# Patient Record
Sex: Female | Born: 1935 | Race: White | Hispanic: No | Marital: Married | Smoking: Never smoker
Health system: Southern US, Community
[De-identification: ages and names within clinical notes are randomized; demographics above are authoritative.]

## PROBLEM LIST (undated history)

## (undated) DIAGNOSIS — G4733 Obstructive sleep apnea (adult) (pediatric): Secondary | ICD-10-CM

## (undated) DIAGNOSIS — Z8673 Personal history of transient ischemic attack (TIA), and cerebral infarction without residual deficits: Secondary | ICD-10-CM

## (undated) DIAGNOSIS — D696 Thrombocytopenia, unspecified: Secondary | ICD-10-CM

## (undated) DIAGNOSIS — I2510 Atherosclerotic heart disease of native coronary artery without angina pectoris: Secondary | ICD-10-CM

## (undated) DIAGNOSIS — I10 Essential (primary) hypertension: Secondary | ICD-10-CM

## (undated) DIAGNOSIS — F329 Major depressive disorder, single episode, unspecified: Secondary | ICD-10-CM

## (undated) DIAGNOSIS — K589 Irritable bowel syndrome without diarrhea: Secondary | ICD-10-CM

## (undated) DIAGNOSIS — G629 Polyneuropathy, unspecified: Secondary | ICD-10-CM

## (undated) DIAGNOSIS — M1990 Unspecified osteoarthritis, unspecified site: Secondary | ICD-10-CM

## (undated) DIAGNOSIS — I7101 Dissection of thoracic aorta: Secondary | ICD-10-CM

## (undated) DIAGNOSIS — F32A Depression, unspecified: Secondary | ICD-10-CM

## (undated) DIAGNOSIS — Q211 Atrial septal defect, unspecified: Secondary | ICD-10-CM

## (undated) DIAGNOSIS — I71019 Dissection of thoracic aorta, unspecified: Secondary | ICD-10-CM

## (undated) DIAGNOSIS — E785 Hyperlipidemia, unspecified: Secondary | ICD-10-CM

## (undated) DIAGNOSIS — G4730 Sleep apnea, unspecified: Secondary | ICD-10-CM

## (undated) DIAGNOSIS — H3530 Unspecified macular degeneration: Secondary | ICD-10-CM

## (undated) DIAGNOSIS — D509 Iron deficiency anemia, unspecified: Secondary | ICD-10-CM

## (undated) DIAGNOSIS — R7303 Prediabetes: Secondary | ICD-10-CM

## (undated) DIAGNOSIS — D649 Anemia, unspecified: Secondary | ICD-10-CM

## (undated) DIAGNOSIS — I5032 Chronic diastolic (congestive) heart failure: Secondary | ICD-10-CM

## (undated) DIAGNOSIS — K224 Dyskinesia of esophagus: Secondary | ICD-10-CM

## (undated) DIAGNOSIS — K222 Esophageal obstruction: Secondary | ICD-10-CM

## (undated) DIAGNOSIS — I50810 Right heart failure, unspecified: Secondary | ICD-10-CM

## (undated) DIAGNOSIS — I4891 Unspecified atrial fibrillation: Secondary | ICD-10-CM

## (undated) DIAGNOSIS — M797 Fibromyalgia: Secondary | ICD-10-CM

## (undated) DIAGNOSIS — K219 Gastro-esophageal reflux disease without esophagitis: Secondary | ICD-10-CM

## (undated) DIAGNOSIS — F419 Anxiety disorder, unspecified: Secondary | ICD-10-CM

## (undated) DIAGNOSIS — K635 Polyp of colon: Secondary | ICD-10-CM

## (undated) DIAGNOSIS — C801 Malignant (primary) neoplasm, unspecified: Secondary | ICD-10-CM

## (undated) HISTORY — DX: Essential (primary) hypertension: I10

## (undated) HISTORY — DX: Dyskinesia of esophagus: K22.4

## (undated) HISTORY — DX: Sleep apnea, unspecified: G47.30

## (undated) HISTORY — DX: Atrial septal defect: Q21.1

## (undated) HISTORY — DX: Hyperlipidemia, unspecified: E78.5

## (undated) HISTORY — DX: Anemia, unspecified: D64.9

## (undated) HISTORY — DX: Right heart failure, unspecified: I50.810

## (undated) HISTORY — PX: TONSILLECTOMY AND ADENOIDECTOMY: SUR1326

## (undated) HISTORY — DX: Gastro-esophageal reflux disease without esophagitis: K21.9

## (undated) HISTORY — PX: APPENDECTOMY: SHX54

## (undated) HISTORY — DX: Major depressive disorder, single episode, unspecified: F32.9

## (undated) HISTORY — DX: Polyp of colon: K63.5

## (undated) HISTORY — DX: Atrial septal defect, unspecified: Q21.10

## (undated) HISTORY — DX: Prediabetes: R73.03

## (undated) HISTORY — DX: Thrombocytopenia, unspecified: D69.6

## (undated) HISTORY — PX: CATARACT EXTRACTION W/ INTRAOCULAR LENS  IMPLANT, BILATERAL: SHX1307

## (undated) HISTORY — DX: Dissection of thoracic aorta, unspecified: I71.019

## (undated) HISTORY — DX: Esophageal obstruction: K22.2

## (undated) HISTORY — DX: Obstructive sleep apnea (adult) (pediatric): G47.33

## (undated) HISTORY — DX: Iron deficiency anemia, unspecified: D50.9

## (undated) HISTORY — PX: ESOPHAGOGASTRODUODENOSCOPY (EGD) WITH ESOPHAGEAL DILATION: SHX5812

## (undated) HISTORY — DX: Fibromyalgia: M79.7

## (undated) HISTORY — DX: Irritable bowel syndrome, unspecified: K58.9

## (undated) HISTORY — PX: CARDIOVERSION: SHX1299

## (undated) HISTORY — DX: Malignant (primary) neoplasm, unspecified: C80.1

## (undated) HISTORY — DX: Dissection of thoracic aorta: I71.01

## (undated) HISTORY — DX: Anxiety disorder, unspecified: F41.9

## (undated) HISTORY — DX: Unspecified macular degeneration: H35.30

## (undated) HISTORY — DX: Atherosclerotic heart disease of native coronary artery without angina pectoris: I25.10

## (undated) HISTORY — DX: Unspecified osteoarthritis, unspecified site: M19.90

## (undated) HISTORY — DX: Depression, unspecified: F32.A

## (undated) HISTORY — DX: Polyneuropathy, unspecified: G62.9

## (undated) HISTORY — PX: CARDIAC CATHETERIZATION: SHX172

## (undated) HISTORY — DX: Unspecified atrial fibrillation: I48.91

## (undated) HISTORY — DX: Personal history of transient ischemic attack (TIA), and cerebral infarction without residual deficits: Z86.73

---

## 1967-11-16 HISTORY — PX: TUBAL LIGATION: SHX77

## 1980-11-15 HISTORY — PX: ASD REPAIR: SHX258

## 1985-11-15 HISTORY — PX: VAGINAL HYSTERECTOMY: SUR661

## 1992-11-15 HISTORY — PX: SALPINGOOPHORECTOMY: SHX82

## 1992-11-15 HISTORY — PX: PELVIC LAPAROSCOPY: SHX162

## 1998-02-18 ENCOUNTER — Other Ambulatory Visit: Admission: RE | Admit: 1998-02-18 | Discharge: 1998-02-18 | Payer: Self-pay | Admitting: Obstetrics and Gynecology

## 1998-04-23 ENCOUNTER — Other Ambulatory Visit: Admission: RE | Admit: 1998-04-23 | Discharge: 1998-04-23 | Payer: Self-pay | Admitting: Obstetrics and Gynecology

## 1999-05-11 ENCOUNTER — Encounter: Payer: Self-pay | Admitting: Emergency Medicine

## 1999-05-11 ENCOUNTER — Inpatient Hospital Stay (HOSPITAL_COMMUNITY): Admission: EM | Admit: 1999-05-11 | Discharge: 1999-05-15 | Payer: Self-pay | Admitting: Emergency Medicine

## 1999-05-12 ENCOUNTER — Encounter: Payer: Self-pay | Admitting: Pulmonary Disease

## 1999-06-08 ENCOUNTER — Encounter: Payer: Self-pay | Admitting: *Deleted

## 1999-06-08 ENCOUNTER — Ambulatory Visit (HOSPITAL_COMMUNITY): Admission: RE | Admit: 1999-06-08 | Discharge: 1999-06-08 | Payer: Self-pay | Admitting: *Deleted

## 1999-06-11 ENCOUNTER — Ambulatory Visit (HOSPITAL_COMMUNITY): Admission: RE | Admit: 1999-06-11 | Discharge: 1999-06-11 | Payer: Self-pay | Admitting: *Deleted

## 1999-06-11 ENCOUNTER — Encounter: Payer: Self-pay | Admitting: *Deleted

## 1999-11-13 ENCOUNTER — Ambulatory Visit (HOSPITAL_COMMUNITY): Admission: RE | Admit: 1999-11-13 | Discharge: 1999-11-13 | Payer: Self-pay | Admitting: Neurology

## 1999-11-13 ENCOUNTER — Encounter: Payer: Self-pay | Admitting: Neurology

## 2000-03-04 ENCOUNTER — Ambulatory Visit: Admission: RE | Admit: 2000-03-04 | Discharge: 2000-03-04 | Payer: Self-pay | Admitting: Orthopedic Surgery

## 2000-06-30 ENCOUNTER — Other Ambulatory Visit: Admission: RE | Admit: 2000-06-30 | Discharge: 2000-06-30 | Payer: Self-pay | Admitting: Obstetrics and Gynecology

## 2000-07-07 ENCOUNTER — Encounter: Payer: Self-pay | Admitting: Pulmonary Disease

## 2000-07-07 ENCOUNTER — Ambulatory Visit (HOSPITAL_COMMUNITY): Admission: RE | Admit: 2000-07-07 | Discharge: 2000-07-07 | Payer: Self-pay | Admitting: Pulmonary Disease

## 2000-07-28 ENCOUNTER — Encounter: Admission: RE | Admit: 2000-07-28 | Discharge: 2000-07-28 | Payer: Self-pay | Admitting: Obstetrics and Gynecology

## 2000-07-28 ENCOUNTER — Encounter: Payer: Self-pay | Admitting: Obstetrics and Gynecology

## 2001-03-21 ENCOUNTER — Other Ambulatory Visit: Admission: RE | Admit: 2001-03-21 | Discharge: 2001-03-21 | Payer: Self-pay | Admitting: Internal Medicine

## 2001-08-08 ENCOUNTER — Encounter: Admission: RE | Admit: 2001-08-08 | Discharge: 2001-08-08 | Payer: Self-pay | Admitting: Obstetrics and Gynecology

## 2001-08-08 ENCOUNTER — Encounter: Payer: Self-pay | Admitting: Obstetrics and Gynecology

## 2002-04-10 ENCOUNTER — Encounter (INDEPENDENT_AMBULATORY_CARE_PROVIDER_SITE_OTHER): Payer: Self-pay | Admitting: *Deleted

## 2002-04-10 ENCOUNTER — Ambulatory Visit (HOSPITAL_COMMUNITY): Admission: RE | Admit: 2002-04-10 | Discharge: 2002-04-10 | Payer: Self-pay | Admitting: Internal Medicine

## 2002-04-10 ENCOUNTER — Encounter: Payer: Self-pay | Admitting: Internal Medicine

## 2002-09-18 ENCOUNTER — Encounter: Admission: RE | Admit: 2002-09-18 | Discharge: 2002-09-18 | Payer: Self-pay | Admitting: Obstetrics and Gynecology

## 2002-09-18 ENCOUNTER — Encounter: Payer: Self-pay | Admitting: Obstetrics and Gynecology

## 2002-10-02 ENCOUNTER — Other Ambulatory Visit: Admission: RE | Admit: 2002-10-02 | Discharge: 2002-10-02 | Payer: Self-pay | Admitting: Obstetrics and Gynecology

## 2003-04-16 ENCOUNTER — Encounter: Payer: Self-pay | Admitting: Internal Medicine

## 2003-10-28 ENCOUNTER — Encounter: Admission: RE | Admit: 2003-10-28 | Discharge: 2003-10-28 | Payer: Self-pay | Admitting: Pulmonary Disease

## 2004-09-24 ENCOUNTER — Ambulatory Visit: Payer: Self-pay

## 2004-10-05 ENCOUNTER — Ambulatory Visit: Payer: Self-pay | Admitting: Internal Medicine

## 2004-10-26 ENCOUNTER — Ambulatory Visit: Payer: Self-pay | Admitting: Cardiology

## 2004-11-02 ENCOUNTER — Ambulatory Visit: Payer: Self-pay | Admitting: Pulmonary Disease

## 2004-11-06 ENCOUNTER — Ambulatory Visit: Payer: Self-pay | Admitting: Cardiology

## 2004-11-23 ENCOUNTER — Ambulatory Visit: Payer: Self-pay | Admitting: Cardiology

## 2004-12-07 ENCOUNTER — Encounter: Admission: RE | Admit: 2004-12-07 | Discharge: 2004-12-07 | Payer: Self-pay | Admitting: Pulmonary Disease

## 2004-12-14 ENCOUNTER — Ambulatory Visit: Payer: Self-pay | Admitting: Internal Medicine

## 2004-12-23 ENCOUNTER — Ambulatory Visit: Payer: Self-pay | Admitting: Pulmonary Disease

## 2004-12-28 ENCOUNTER — Other Ambulatory Visit: Admission: RE | Admit: 2004-12-28 | Discharge: 2004-12-28 | Payer: Self-pay | Admitting: Obstetrics and Gynecology

## 2005-01-07 ENCOUNTER — Ambulatory Visit: Payer: Self-pay | Admitting: Pulmonary Disease

## 2005-01-13 ENCOUNTER — Ambulatory Visit: Payer: Self-pay | Admitting: Cardiology

## 2005-01-28 ENCOUNTER — Ambulatory Visit: Payer: Self-pay | Admitting: *Deleted

## 2005-02-10 ENCOUNTER — Ambulatory Visit: Payer: Self-pay | Admitting: *Deleted

## 2005-03-03 ENCOUNTER — Ambulatory Visit: Payer: Self-pay | Admitting: *Deleted

## 2005-03-03 ENCOUNTER — Ambulatory Visit: Payer: Self-pay | Admitting: Internal Medicine

## 2005-03-17 ENCOUNTER — Ambulatory Visit: Payer: Self-pay

## 2005-03-30 ENCOUNTER — Ambulatory Visit: Payer: Self-pay | Admitting: Cardiology

## 2005-04-07 ENCOUNTER — Ambulatory Visit: Payer: Self-pay | Admitting: Pulmonary Disease

## 2005-04-19 ENCOUNTER — Ambulatory Visit: Payer: Self-pay | Admitting: Cardiology

## 2005-04-20 ENCOUNTER — Ambulatory Visit: Payer: Self-pay | Admitting: Internal Medicine

## 2005-04-27 ENCOUNTER — Ambulatory Visit: Payer: Self-pay | Admitting: Internal Medicine

## 2005-05-14 ENCOUNTER — Ambulatory Visit: Payer: Self-pay | Admitting: Pulmonary Disease

## 2005-05-20 ENCOUNTER — Ambulatory Visit: Payer: Self-pay | Admitting: Internal Medicine

## 2005-06-01 ENCOUNTER — Ambulatory Visit: Payer: Self-pay | Admitting: *Deleted

## 2005-06-03 ENCOUNTER — Ambulatory Visit: Payer: Self-pay | Admitting: Cardiology

## 2005-06-23 ENCOUNTER — Ambulatory Visit: Payer: Self-pay | Admitting: Cardiology

## 2005-06-29 ENCOUNTER — Ambulatory Visit: Payer: Self-pay | Admitting: Pulmonary Disease

## 2005-07-09 ENCOUNTER — Observation Stay (HOSPITAL_COMMUNITY): Admission: EM | Admit: 2005-07-09 | Discharge: 2005-07-11 | Payer: Self-pay | Admitting: Emergency Medicine

## 2005-07-10 ENCOUNTER — Ambulatory Visit: Payer: Self-pay | Admitting: Cardiology

## 2005-07-13 ENCOUNTER — Ambulatory Visit: Payer: Self-pay | Admitting: Cardiovascular Disease

## 2005-07-14 ENCOUNTER — Ambulatory Visit: Payer: Self-pay | Admitting: *Deleted

## 2005-07-22 ENCOUNTER — Ambulatory Visit: Payer: Self-pay | Admitting: Internal Medicine

## 2005-07-29 ENCOUNTER — Ambulatory Visit: Payer: Self-pay | Admitting: Internal Medicine

## 2005-08-11 ENCOUNTER — Ambulatory Visit: Payer: Self-pay | Admitting: Pulmonary Disease

## 2005-08-19 ENCOUNTER — Ambulatory Visit: Payer: Self-pay | Admitting: Cardiology

## 2005-08-19 ENCOUNTER — Ambulatory Visit: Payer: Self-pay

## 2005-08-27 ENCOUNTER — Ambulatory Visit: Payer: Self-pay | Admitting: Pulmonary Disease

## 2005-09-10 ENCOUNTER — Ambulatory Visit: Payer: Self-pay | Admitting: Cardiology

## 2005-09-22 ENCOUNTER — Ambulatory Visit: Payer: Self-pay | Admitting: Pulmonary Disease

## 2005-09-23 ENCOUNTER — Ambulatory Visit: Payer: Self-pay | Admitting: *Deleted

## 2005-09-23 ENCOUNTER — Ambulatory Visit: Payer: Self-pay | Admitting: Cardiology

## 2005-10-13 ENCOUNTER — Ambulatory Visit: Payer: Self-pay | Admitting: Pulmonary Disease

## 2005-10-15 ENCOUNTER — Ambulatory Visit: Payer: Self-pay | Admitting: Internal Medicine

## 2005-10-19 ENCOUNTER — Encounter (INDEPENDENT_AMBULATORY_CARE_PROVIDER_SITE_OTHER): Payer: Self-pay | Admitting: *Deleted

## 2005-10-19 ENCOUNTER — Ambulatory Visit: Payer: Self-pay | Admitting: Cardiology

## 2005-11-11 ENCOUNTER — Ambulatory Visit: Payer: Self-pay | Admitting: *Deleted

## 2005-11-15 HISTORY — PX: SHOULDER OPEN ROTATOR CUFF REPAIR: SHX2407

## 2005-11-22 ENCOUNTER — Ambulatory Visit: Payer: Self-pay | Admitting: Cardiology

## 2005-12-20 ENCOUNTER — Ambulatory Visit: Payer: Self-pay | Admitting: Internal Medicine

## 2006-01-04 ENCOUNTER — Ambulatory Visit: Payer: Self-pay | Admitting: Cardiovascular Disease

## 2006-01-12 ENCOUNTER — Encounter: Admission: RE | Admit: 2006-01-12 | Discharge: 2006-01-12 | Payer: Self-pay | Admitting: Obstetrics and Gynecology

## 2006-01-28 ENCOUNTER — Ambulatory Visit: Payer: Self-pay | Admitting: Cardiovascular Disease

## 2006-02-04 ENCOUNTER — Ambulatory Visit: Payer: Self-pay | Admitting: Pulmonary Disease

## 2006-02-09 ENCOUNTER — Ambulatory Visit: Payer: Self-pay | Admitting: *Deleted

## 2006-03-09 ENCOUNTER — Ambulatory Visit: Payer: Self-pay | Admitting: *Deleted

## 2006-04-04 ENCOUNTER — Ambulatory Visit: Payer: Self-pay | Admitting: *Deleted

## 2006-04-04 ENCOUNTER — Ambulatory Visit: Payer: Self-pay | Admitting: Cardiology

## 2006-04-06 ENCOUNTER — Ambulatory Visit (HOSPITAL_COMMUNITY): Admission: RE | Admit: 2006-04-06 | Discharge: 2006-04-08 | Payer: Self-pay | Admitting: Orthopedic Surgery

## 2006-04-12 ENCOUNTER — Ambulatory Visit: Payer: Self-pay | Admitting: Internal Medicine

## 2006-04-19 ENCOUNTER — Ambulatory Visit: Payer: Self-pay | Admitting: Cardiology

## 2006-04-29 ENCOUNTER — Ambulatory Visit: Payer: Self-pay | Admitting: Cardiology

## 2006-05-13 ENCOUNTER — Ambulatory Visit: Payer: Self-pay | Admitting: Cardiology

## 2006-05-27 ENCOUNTER — Ambulatory Visit: Payer: Self-pay | Admitting: Cardiology

## 2006-06-17 ENCOUNTER — Ambulatory Visit: Payer: Self-pay | Admitting: Internal Medicine

## 2006-07-15 ENCOUNTER — Ambulatory Visit: Payer: Self-pay | Admitting: Cardiology

## 2006-08-11 ENCOUNTER — Ambulatory Visit: Payer: Self-pay | Admitting: Cardiology

## 2006-09-06 ENCOUNTER — Ambulatory Visit: Payer: Self-pay | Admitting: Pulmonary Disease

## 2006-09-08 ENCOUNTER — Ambulatory Visit: Payer: Self-pay | Admitting: Cardiology

## 2006-10-03 ENCOUNTER — Inpatient Hospital Stay (HOSPITAL_COMMUNITY): Admission: EM | Admit: 2006-10-03 | Discharge: 2006-10-04 | Payer: Self-pay | Admitting: Emergency Medicine

## 2006-10-12 ENCOUNTER — Ambulatory Visit: Payer: Self-pay | Admitting: Cardiovascular Disease

## 2006-10-12 ENCOUNTER — Ambulatory Visit: Payer: Self-pay | Admitting: *Deleted

## 2006-10-21 ENCOUNTER — Encounter: Payer: Self-pay | Admitting: Cardiology

## 2006-10-21 ENCOUNTER — Ambulatory Visit: Payer: Self-pay

## 2006-11-03 ENCOUNTER — Ambulatory Visit: Payer: Self-pay | Admitting: Cardiology

## 2006-11-17 ENCOUNTER — Ambulatory Visit: Payer: Self-pay | Admitting: Cardiology

## 2006-12-08 ENCOUNTER — Ambulatory Visit: Payer: Self-pay | Admitting: Cardiology

## 2006-12-27 ENCOUNTER — Ambulatory Visit: Payer: Self-pay | Admitting: Pulmonary Disease

## 2006-12-30 ENCOUNTER — Other Ambulatory Visit: Admission: RE | Admit: 2006-12-30 | Discharge: 2006-12-30 | Payer: Self-pay | Admitting: Obstetrics and Gynecology

## 2007-01-05 ENCOUNTER — Ambulatory Visit: Payer: Self-pay | Admitting: Cardiology

## 2007-01-16 ENCOUNTER — Encounter: Admission: RE | Admit: 2007-01-16 | Discharge: 2007-01-16 | Payer: Self-pay | Admitting: Pulmonary Disease

## 2007-01-25 ENCOUNTER — Ambulatory Visit: Payer: Self-pay | Admitting: Cardiology

## 2007-01-31 ENCOUNTER — Ambulatory Visit: Payer: Self-pay | Admitting: Pulmonary Disease

## 2007-01-31 LAB — CONVERTED CEMR LAB
ALT: 17 units/L (ref 0–40)
Albumin: 4 g/dL (ref 3.5–5.2)
Basophils Absolute: 0 10*3/uL (ref 0.0–0.1)
Bilirubin, Direct: 0.2 mg/dL (ref 0.0–0.3)
Calcium: 9.6 mg/dL (ref 8.4–10.5)
Cholesterol: 171 mg/dL (ref 0–200)
Eosinophils Absolute: 0.2 10*3/uL (ref 0.0–0.6)
Eosinophils Relative: 3.4 % (ref 0.0–5.0)
GFR calc Af Amer: 107 mL/min
GFR calc non Af Amer: 88 mL/min
Glucose, Bld: 138 mg/dL — ABNORMAL HIGH (ref 70–99)
HDL: 41.5 mg/dL (ref >39.0)
Lymphocytes Relative: 30.2 % (ref 12.0–46.0)
MCHC: 34.1 g/dL (ref 30.0–36.0)
MCV: 95.6 fL (ref 78.0–100.0)
Neutro Abs: 3.2 10*3/uL (ref 1.4–7.7)
Platelets: 203 10*3/uL (ref 150–400)
Sodium: 139 meq/L (ref 135–145)
TSH: 2.87 microintl units/mL (ref 0.35–5.50)
Total CHOL/HDL Ratio: 4.1
Triglycerides: 182 mg/dL — ABNORMAL HIGH (ref 0–149)
WBC: 5.4 10*3/uL (ref 4.5–10.5)

## 2007-02-14 ENCOUNTER — Ambulatory Visit: Payer: Self-pay | Admitting: *Deleted

## 2007-03-16 ENCOUNTER — Ambulatory Visit: Payer: Self-pay | Admitting: *Deleted

## 2007-03-16 ENCOUNTER — Ambulatory Visit: Payer: Self-pay | Admitting: Cardiovascular Disease

## 2007-04-17 ENCOUNTER — Ambulatory Visit: Payer: Self-pay | Admitting: Cardiovascular Disease

## 2007-05-16 ENCOUNTER — Ambulatory Visit: Payer: Self-pay | Admitting: Cardiology

## 2007-06-08 ENCOUNTER — Ambulatory Visit: Payer: Self-pay | Admitting: Pulmonary Disease

## 2007-06-08 ENCOUNTER — Ambulatory Visit: Payer: Self-pay | Admitting: Cardiovascular Disease

## 2007-06-08 LAB — CONVERTED CEMR LAB
Chloride: 103 meq/L (ref 96–112)
Cholesterol: 143 mg/dL (ref 0–200)
Creatinine, Ser: 0.8 mg/dL (ref 0.4–1.2)
Glucose, Bld: 119 mg/dL — ABNORMAL HIGH (ref 70–99)
HDL: 32.3 mg/dL — ABNORMAL LOW (ref >39.0)
Sodium: 144 meq/L (ref 135–145)

## 2007-06-16 ENCOUNTER — Ambulatory Visit: Payer: Self-pay | Admitting: Internal Medicine

## 2007-07-13 ENCOUNTER — Ambulatory Visit: Payer: Self-pay | Admitting: Cardiovascular Disease

## 2007-07-13 ENCOUNTER — Ambulatory Visit: Payer: Self-pay | Admitting: Cardiology

## 2007-08-08 ENCOUNTER — Ambulatory Visit: Payer: Self-pay | Admitting: Cardiology

## 2007-08-30 ENCOUNTER — Ambulatory Visit: Payer: Self-pay | Admitting: Pulmonary Disease

## 2007-09-07 ENCOUNTER — Ambulatory Visit: Payer: Self-pay | Admitting: Cardiology

## 2007-10-03 ENCOUNTER — Telehealth (INDEPENDENT_AMBULATORY_CARE_PROVIDER_SITE_OTHER): Payer: Self-pay | Admitting: *Deleted

## 2007-10-03 DIAGNOSIS — M545 Low back pain: Secondary | ICD-10-CM

## 2007-10-03 DIAGNOSIS — I480 Paroxysmal atrial fibrillation: Secondary | ICD-10-CM

## 2007-10-03 DIAGNOSIS — IMO0001 Reserved for inherently not codable concepts without codable children: Secondary | ICD-10-CM

## 2007-10-03 DIAGNOSIS — I10 Essential (primary) hypertension: Secondary | ICD-10-CM

## 2007-10-03 DIAGNOSIS — G4733 Obstructive sleep apnea (adult) (pediatric): Secondary | ICD-10-CM

## 2007-10-03 DIAGNOSIS — I872 Venous insufficiency (chronic) (peripheral): Secondary | ICD-10-CM

## 2007-10-03 DIAGNOSIS — E7800 Pure hypercholesterolemia, unspecified: Secondary | ICD-10-CM

## 2007-10-04 ENCOUNTER — Ambulatory Visit: Payer: Self-pay | Admitting: Pulmonary Disease

## 2007-10-05 ENCOUNTER — Ambulatory Visit: Payer: Self-pay | Admitting: Cardiology

## 2007-10-18 ENCOUNTER — Telehealth (INDEPENDENT_AMBULATORY_CARE_PROVIDER_SITE_OTHER): Payer: Self-pay | Admitting: *Deleted

## 2007-10-26 ENCOUNTER — Ambulatory Visit: Payer: Self-pay | Admitting: Cardiology

## 2007-11-24 ENCOUNTER — Ambulatory Visit: Payer: Self-pay | Admitting: Cardiology

## 2007-11-30 ENCOUNTER — Encounter: Payer: Self-pay | Admitting: Pulmonary Disease

## 2007-12-11 ENCOUNTER — Telehealth: Payer: Self-pay | Admitting: Pulmonary Disease

## 2007-12-12 ENCOUNTER — Ambulatory Visit: Payer: Self-pay | Admitting: Pulmonary Disease

## 2007-12-14 ENCOUNTER — Ambulatory Visit: Payer: Self-pay | Admitting: Pulmonary Disease

## 2007-12-16 DIAGNOSIS — R7302 Impaired glucose tolerance (oral): Secondary | ICD-10-CM

## 2007-12-16 DIAGNOSIS — G609 Hereditary and idiopathic neuropathy, unspecified: Secondary | ICD-10-CM

## 2007-12-16 DIAGNOSIS — K222 Esophageal obstruction: Secondary | ICD-10-CM

## 2007-12-16 DIAGNOSIS — Q211 Atrial septal defect: Secondary | ICD-10-CM

## 2007-12-16 LAB — CONVERTED CEMR LAB
ALT: 15 units/L (ref 0–35)
AST: 19 units/L (ref 0–37)
Basophils Relative: 0.5 % (ref 0.0–1.0)
Bilirubin, Direct: 0.2 mg/dL (ref 0.0–0.3)
CO2: 33 meq/L — ABNORMAL HIGH (ref 19–32)
Calcium: 9.7 mg/dL (ref 8.4–10.5)
Chloride: 103 meq/L (ref 96–112)
Eosinophils Absolute: 0.2 10*3/uL (ref 0.0–0.6)
Eosinophils Relative: 4.4 % (ref 0.0–5.0)
GFR calc non Af Amer: 105 mL/min
Glucose, Bld: 117 mg/dL — ABNORMAL HIGH (ref 70–99)
LDL Cholesterol: 92 mg/dL (ref 0–99)
Platelets: 166 10*3/uL (ref 150–400)
RBC: 4.07 M/uL (ref 3.87–5.11)
Total CHOL/HDL Ratio: 4.2
Triglycerides: 128 mg/dL (ref 0–149)
VLDL: 26 mg/dL (ref 0–40)
WBC: 4.5 10*3/uL (ref 4.5–10.5)

## 2007-12-18 ENCOUNTER — Telehealth: Payer: Self-pay | Admitting: Pulmonary Disease

## 2007-12-18 ENCOUNTER — Encounter: Payer: Self-pay | Admitting: Pulmonary Disease

## 2007-12-19 ENCOUNTER — Ambulatory Visit: Payer: Self-pay | Admitting: Pulmonary Disease

## 2007-12-19 LAB — CONVERTED CEMR LAB
Fecal Occult Blood: NEGATIVE
OCCULT 2: NEGATIVE
OCCULT 3: NEGATIVE
OCCULT 5: NEGATIVE

## 2007-12-22 ENCOUNTER — Ambulatory Visit: Payer: Self-pay | Admitting: Cardiology

## 2008-01-05 ENCOUNTER — Telehealth (INDEPENDENT_AMBULATORY_CARE_PROVIDER_SITE_OTHER): Payer: Self-pay | Admitting: *Deleted

## 2008-01-08 ENCOUNTER — Telehealth: Payer: Self-pay | Admitting: Pulmonary Disease

## 2008-01-22 ENCOUNTER — Ambulatory Visit: Payer: Self-pay | Admitting: Cardiology

## 2008-01-23 ENCOUNTER — Telehealth (INDEPENDENT_AMBULATORY_CARE_PROVIDER_SITE_OTHER): Payer: Self-pay | Admitting: *Deleted

## 2008-01-31 ENCOUNTER — Ambulatory Visit: Payer: Self-pay | Admitting: Cardiovascular Disease

## 2008-01-31 LAB — CONVERTED CEMR LAB
Chloride: 101 meq/L (ref 96–112)
GFR calc Af Amer: 91 mL/min
GFR calc non Af Amer: 75 mL/min
Potassium: 4.3 meq/L (ref 3.5–5.1)
Sodium: 139 meq/L (ref 135–145)

## 2008-02-01 ENCOUNTER — Telehealth (INDEPENDENT_AMBULATORY_CARE_PROVIDER_SITE_OTHER): Payer: Self-pay | Admitting: *Deleted

## 2008-02-07 ENCOUNTER — Ambulatory Visit: Payer: Self-pay

## 2008-02-20 ENCOUNTER — Ambulatory Visit (HOSPITAL_COMMUNITY): Admission: RE | Admit: 2008-02-20 | Discharge: 2008-02-20 | Payer: Self-pay | Admitting: Cardiovascular Disease

## 2008-02-20 ENCOUNTER — Ambulatory Visit: Payer: Self-pay | Admitting: Cardiology

## 2008-02-20 ENCOUNTER — Ambulatory Visit: Payer: Self-pay | Admitting: Cardiovascular Disease

## 2008-02-21 ENCOUNTER — Encounter: Admission: RE | Admit: 2008-02-21 | Discharge: 2008-02-21 | Payer: Self-pay | Admitting: Pulmonary Disease

## 2008-03-04 ENCOUNTER — Telehealth (INDEPENDENT_AMBULATORY_CARE_PROVIDER_SITE_OTHER): Payer: Self-pay | Admitting: *Deleted

## 2008-03-07 ENCOUNTER — Telehealth (INDEPENDENT_AMBULATORY_CARE_PROVIDER_SITE_OTHER): Payer: Self-pay | Admitting: *Deleted

## 2008-03-12 ENCOUNTER — Ambulatory Visit: Payer: Self-pay | Admitting: Pulmonary Disease

## 2008-03-19 ENCOUNTER — Ambulatory Visit: Payer: Self-pay | Admitting: Cardiology

## 2008-04-09 LAB — CONVERTED CEMR LAB
ALT: 22 units/L (ref 0–35)
Albumin: 4 g/dL (ref 3.5–5.2)
BUN: 15 mg/dL (ref 6–23)
Basophils Absolute: 0 10*3/uL (ref 0.0–0.1)
Basophils Relative: 1.1 % — ABNORMAL HIGH (ref 0.0–1.0)
Calcium: 9.5 mg/dL (ref 8.4–10.5)
Cholesterol: 172 mg/dL (ref 0–200)
Creatinine, Ser: 0.6 mg/dL (ref 0.4–1.2)
Eosinophils Absolute: 0.2 10*3/uL (ref 0.0–0.7)
GFR calc non Af Amer: 105 mL/min
HCT: 38.6 % (ref 36.0–46.0)
Hemoglobin: 13.2 g/dL (ref 12.0–15.0)
Hgb A1c MFr Bld: 6 % (ref 4.6–6.0)
MCHC: 34.1 g/dL (ref 30.0–36.0)
MCV: 96.7 fL (ref 78.0–100.0)
Neutro Abs: 2.5 10*3/uL (ref 1.4–7.7)
RBC: 4 M/uL (ref 3.87–5.11)
TSH: 2.29 microintl units/mL (ref 0.35–5.50)
Total Bilirubin: 0.9 mg/dL (ref 0.3–1.2)

## 2008-04-16 ENCOUNTER — Ambulatory Visit: Payer: Self-pay | Admitting: Cardiology

## 2008-05-20 ENCOUNTER — Ambulatory Visit: Payer: Self-pay | Admitting: Internal Medicine

## 2008-06-03 ENCOUNTER — Telehealth (INDEPENDENT_AMBULATORY_CARE_PROVIDER_SITE_OTHER): Payer: Self-pay | Admitting: *Deleted

## 2008-06-10 ENCOUNTER — Ambulatory Visit: Payer: Self-pay | Admitting: Pulmonary Disease

## 2008-06-12 LAB — CONVERTED CEMR LAB
ALT: 18 units/L (ref 0–35)
AST: 21 units/L (ref 0–37)
Alkaline Phosphatase: 45 units/L (ref 39–117)
Bilirubin, Direct: 0.2 mg/dL (ref 0.0–0.3)
CO2: 33 meq/L — ABNORMAL HIGH (ref 19–32)
Calcium: 9.6 mg/dL (ref 8.4–10.5)
Chloride: 103 meq/L (ref 96–112)
Glucose, Bld: 132 mg/dL — ABNORMAL HIGH (ref 70–99)
HDL: 39.6 mg/dL (ref >39.0)
Sodium: 144 meq/L (ref 135–145)
Total Bilirubin: 0.9 mg/dL (ref 0.3–1.2)
Total Protein: 7.4 g/dL (ref 6.0–8.3)
Triglycerides: 148 mg/dL (ref 0–149)

## 2008-06-20 ENCOUNTER — Telehealth: Payer: Self-pay | Admitting: Internal Medicine

## 2008-06-20 ENCOUNTER — Ambulatory Visit: Payer: Self-pay | Admitting: Internal Medicine

## 2008-07-01 ENCOUNTER — Ambulatory Visit (HOSPITAL_COMMUNITY): Admission: RE | Admit: 2008-07-01 | Discharge: 2008-07-01 | Payer: Self-pay | Admitting: Internal Medicine

## 2008-07-05 DIAGNOSIS — K219 Gastro-esophageal reflux disease without esophagitis: Secondary | ICD-10-CM

## 2008-07-08 ENCOUNTER — Ambulatory Visit: Payer: Self-pay | Admitting: Internal Medicine

## 2008-07-09 ENCOUNTER — Encounter: Payer: Self-pay | Admitting: Internal Medicine

## 2008-07-09 ENCOUNTER — Ambulatory Visit (HOSPITAL_COMMUNITY): Admission: RE | Admit: 2008-07-09 | Discharge: 2008-07-09 | Payer: Self-pay | Admitting: Internal Medicine

## 2008-07-15 ENCOUNTER — Telehealth: Payer: Self-pay | Admitting: Internal Medicine

## 2008-07-17 ENCOUNTER — Ambulatory Visit: Payer: Self-pay | Admitting: Internal Medicine

## 2008-07-17 ENCOUNTER — Ambulatory Visit (HOSPITAL_COMMUNITY): Admission: RE | Admit: 2008-07-17 | Discharge: 2008-07-17 | Payer: Self-pay | Admitting: Internal Medicine

## 2008-07-17 ENCOUNTER — Telehealth: Payer: Self-pay | Admitting: Internal Medicine

## 2008-07-17 ENCOUNTER — Encounter: Payer: Self-pay | Admitting: Internal Medicine

## 2008-07-17 ENCOUNTER — Encounter: Payer: Self-pay | Admitting: Pulmonary Disease

## 2008-07-18 ENCOUNTER — Ambulatory Visit: Payer: Self-pay | Admitting: Cardiovascular Disease

## 2008-07-18 ENCOUNTER — Ambulatory Visit: Payer: Self-pay | Admitting: Internal Medicine

## 2008-07-19 ENCOUNTER — Observation Stay (HOSPITAL_COMMUNITY): Admission: EM | Admit: 2008-07-19 | Discharge: 2008-07-19 | Payer: Self-pay | Admitting: Emergency Medicine

## 2008-07-24 ENCOUNTER — Ambulatory Visit: Payer: Self-pay | Admitting: Internal Medicine

## 2008-07-26 ENCOUNTER — Telehealth: Payer: Self-pay | Admitting: Internal Medicine

## 2008-07-31 ENCOUNTER — Ambulatory Visit: Payer: Self-pay

## 2008-07-31 ENCOUNTER — Ambulatory Visit: Payer: Self-pay | Admitting: Cardiovascular Disease

## 2008-07-31 ENCOUNTER — Encounter: Payer: Self-pay | Admitting: Pulmonary Disease

## 2008-08-07 ENCOUNTER — Ambulatory Visit: Payer: Self-pay | Admitting: Cardiology

## 2008-08-07 ENCOUNTER — Ambulatory Visit: Payer: Self-pay | Admitting: Internal Medicine

## 2008-08-07 ENCOUNTER — Ambulatory Visit: Payer: Self-pay | Admitting: Pulmonary Disease

## 2008-08-16 ENCOUNTER — Ambulatory Visit: Payer: Self-pay | Admitting: Cardiovascular Disease

## 2008-08-26 ENCOUNTER — Ambulatory Visit: Payer: Self-pay | Admitting: Obstetrics and Gynecology

## 2008-09-05 ENCOUNTER — Ambulatory Visit: Payer: Self-pay | Admitting: Cardiology

## 2008-10-02 ENCOUNTER — Encounter: Payer: Self-pay | Admitting: Pulmonary Disease

## 2008-10-03 ENCOUNTER — Ambulatory Visit: Payer: Self-pay | Admitting: Cardiology

## 2008-10-28 ENCOUNTER — Ambulatory Visit: Payer: Self-pay | Admitting: Cardiovascular Disease

## 2008-10-28 ENCOUNTER — Ambulatory Visit: Payer: Self-pay | Admitting: Pulmonary Disease

## 2008-10-28 ENCOUNTER — Ambulatory Visit: Payer: Self-pay | Admitting: Cardiology

## 2008-11-25 ENCOUNTER — Ambulatory Visit: Payer: Self-pay | Admitting: Cardiology

## 2008-12-11 ENCOUNTER — Ambulatory Visit: Payer: Self-pay | Admitting: Pulmonary Disease

## 2008-12-12 ENCOUNTER — Ambulatory Visit: Payer: Self-pay | Admitting: Pulmonary Disease

## 2008-12-16 ENCOUNTER — Telehealth (INDEPENDENT_AMBULATORY_CARE_PROVIDER_SITE_OTHER): Payer: Self-pay | Admitting: *Deleted

## 2008-12-16 LAB — CONVERTED CEMR LAB
ALT: 16 units/L (ref 0–35)
Albumin: 4 g/dL (ref 3.5–5.2)
BUN: 18 mg/dL (ref 6–23)
Basophils Relative: 0.7 % (ref 0.0–3.0)
Bilirubin, Direct: 0.2 mg/dL (ref 0.0–0.3)
CO2: 33 meq/L — ABNORMAL HIGH (ref 19–32)
Calcium: 9.6 mg/dL (ref 8.4–10.5)
Eosinophils Relative: 2.9 % (ref 0.0–5.0)
GFR calc Af Amer: 106 mL/min
Glucose, Bld: 120 mg/dL — ABNORMAL HIGH (ref 70–99)
HCT: 39.6 % (ref 36.0–46.0)
Hemoglobin: 13.5 g/dL (ref 12.0–15.0)
LDL Cholesterol: 117 mg/dL — ABNORMAL HIGH (ref 0–99)
Lymphocytes Relative: 28.3 % (ref 12.0–46.0)
Monocytes Absolute: 0.4 10*3/uL (ref 0.1–1.0)
Monocytes Relative: 7.3 % (ref 3.0–12.0)
Neutro Abs: 3.2 10*3/uL (ref 1.4–7.7)
RBC: 4.01 M/uL (ref 3.87–5.11)
Sodium: 141 meq/L (ref 135–145)
TSH: 2.83 microintl units/mL (ref 0.35–5.50)
Total CHOL/HDL Ratio: 4.1
Total Protein: 7 g/dL (ref 6.0–8.3)
Triglycerides: 85 mg/dL (ref 0–149)
WBC: 5.3 10*3/uL (ref 4.5–10.5)

## 2008-12-26 ENCOUNTER — Ambulatory Visit: Payer: Self-pay | Admitting: Cardiovascular Disease

## 2008-12-31 ENCOUNTER — Encounter
Admission: RE | Admit: 2008-12-31 | Discharge: 2009-02-24 | Payer: Self-pay | Admitting: Physical Medicine and Rehabilitation

## 2009-01-06 ENCOUNTER — Encounter: Payer: Self-pay | Admitting: Pulmonary Disease

## 2009-01-23 ENCOUNTER — Ambulatory Visit: Payer: Self-pay | Admitting: Cardiovascular Disease

## 2009-02-03 ENCOUNTER — Telehealth: Payer: Self-pay | Admitting: Pulmonary Disease

## 2009-02-04 ENCOUNTER — Telehealth: Payer: Self-pay | Admitting: Pulmonary Disease

## 2009-02-05 ENCOUNTER — Encounter: Payer: Self-pay | Admitting: Pulmonary Disease

## 2009-02-12 ENCOUNTER — Ambulatory Visit: Payer: Self-pay | Admitting: Cardiology

## 2009-02-26 ENCOUNTER — Encounter: Admission: RE | Admit: 2009-02-26 | Discharge: 2009-02-26 | Payer: Self-pay | Admitting: Pulmonary Disease

## 2009-02-26 ENCOUNTER — Ambulatory Visit: Payer: Self-pay | Admitting: Internal Medicine

## 2009-02-28 ENCOUNTER — Encounter: Admission: RE | Admit: 2009-02-28 | Discharge: 2009-02-28 | Payer: Self-pay

## 2009-03-05 ENCOUNTER — Encounter: Payer: Self-pay | Admitting: Pulmonary Disease

## 2009-03-26 ENCOUNTER — Ambulatory Visit: Payer: Self-pay | Admitting: Cardiology

## 2009-04-09 ENCOUNTER — Telehealth: Payer: Self-pay | Admitting: Cardiovascular Disease

## 2009-04-11 ENCOUNTER — Ambulatory Visit: Payer: Self-pay | Admitting: Internal Medicine

## 2009-04-11 LAB — CONVERTED CEMR LAB: Protime: 18

## 2009-04-15 ENCOUNTER — Encounter: Payer: Self-pay | Admitting: *Deleted

## 2009-04-23 ENCOUNTER — Ambulatory Visit: Payer: Self-pay | Admitting: Cardiology

## 2009-04-23 LAB — CONVERTED CEMR LAB: POC INR: 2

## 2009-05-21 ENCOUNTER — Ambulatory Visit: Payer: Self-pay | Admitting: Internal Medicine

## 2009-05-21 ENCOUNTER — Encounter: Payer: Self-pay | Admitting: *Deleted

## 2009-05-21 LAB — CONVERTED CEMR LAB: Prothrombin Time: 19.6 s

## 2009-06-12 ENCOUNTER — Ambulatory Visit: Payer: Self-pay | Admitting: Cardiovascular Disease

## 2009-06-12 LAB — CONVERTED CEMR LAB
POC INR: 1.7
Prothrombin Time: 16.1 s

## 2009-06-24 ENCOUNTER — Ambulatory Visit: Payer: Self-pay | Admitting: Pulmonary Disease

## 2009-06-24 DIAGNOSIS — F341 Dysthymic disorder: Secondary | ICD-10-CM

## 2009-07-01 ENCOUNTER — Ambulatory Visit: Payer: Self-pay | Admitting: Cardiovascular Disease

## 2009-07-01 LAB — CONVERTED CEMR LAB: POC INR: 2.3

## 2009-07-02 ENCOUNTER — Telehealth (INDEPENDENT_AMBULATORY_CARE_PROVIDER_SITE_OTHER): Payer: Self-pay | Admitting: *Deleted

## 2009-07-09 ENCOUNTER — Ambulatory Visit: Payer: Self-pay | Admitting: Pulmonary Disease

## 2009-07-19 DIAGNOSIS — Z8673 Personal history of transient ischemic attack (TIA), and cerebral infarction without residual deficits: Secondary | ICD-10-CM

## 2009-07-28 ENCOUNTER — Ambulatory Visit: Payer: Self-pay | Admitting: Cardiovascular Disease

## 2009-07-31 ENCOUNTER — Telehealth: Payer: Self-pay | Admitting: Cardiovascular Disease

## 2009-08-01 ENCOUNTER — Telehealth: Payer: Self-pay | Admitting: Cardiovascular Disease

## 2009-08-06 ENCOUNTER — Telehealth: Payer: Self-pay | Admitting: Cardiovascular Disease

## 2009-08-13 ENCOUNTER — Encounter: Payer: Self-pay | Admitting: Cardiovascular Disease

## 2009-08-13 ENCOUNTER — Ambulatory Visit: Payer: Self-pay

## 2009-08-25 ENCOUNTER — Telehealth: Payer: Self-pay | Admitting: Cardiovascular Disease

## 2009-08-26 ENCOUNTER — Ambulatory Visit: Payer: Self-pay | Admitting: Pulmonary Disease

## 2009-08-27 ENCOUNTER — Ambulatory Visit: Payer: Self-pay | Admitting: Internal Medicine

## 2009-08-27 LAB — CONVERTED CEMR LAB: POC INR: 2.5

## 2009-09-22 ENCOUNTER — Ambulatory Visit: Payer: Self-pay | Admitting: Pulmonary Disease

## 2009-09-25 DIAGNOSIS — B370 Candidal stomatitis: Secondary | ICD-10-CM

## 2009-09-29 ENCOUNTER — Ambulatory Visit: Payer: Self-pay | Admitting: Cardiology

## 2009-10-15 ENCOUNTER — Telehealth: Payer: Self-pay | Admitting: Pulmonary Disease

## 2009-10-27 ENCOUNTER — Ambulatory Visit: Payer: Self-pay | Admitting: Cardiovascular Disease

## 2009-11-12 ENCOUNTER — Telehealth: Payer: Self-pay | Admitting: Pulmonary Disease

## 2009-11-18 ENCOUNTER — Telehealth (INDEPENDENT_AMBULATORY_CARE_PROVIDER_SITE_OTHER): Payer: Self-pay | Admitting: *Deleted

## 2009-11-26 ENCOUNTER — Telehealth: Payer: Self-pay | Admitting: Pulmonary Disease

## 2009-11-27 ENCOUNTER — Ambulatory Visit: Payer: Self-pay | Admitting: Internal Medicine

## 2009-11-28 ENCOUNTER — Ambulatory Visit: Payer: Self-pay | Admitting: Gastroenterology

## 2009-11-28 ENCOUNTER — Telehealth (INDEPENDENT_AMBULATORY_CARE_PROVIDER_SITE_OTHER): Payer: Self-pay | Admitting: *Deleted

## 2009-11-28 DIAGNOSIS — K589 Irritable bowel syndrome without diarrhea: Secondary | ICD-10-CM

## 2009-11-28 DIAGNOSIS — Z860100 Personal history of colon polyps, unspecified: Secondary | ICD-10-CM | POA: Insufficient documentation

## 2009-11-28 DIAGNOSIS — K625 Hemorrhage of anus and rectum: Secondary | ICD-10-CM

## 2009-12-01 ENCOUNTER — Telehealth: Payer: Self-pay | Admitting: Physician Assistant

## 2009-12-04 ENCOUNTER — Ambulatory Visit: Payer: Self-pay | Admitting: Cardiology

## 2009-12-04 LAB — CONVERTED CEMR LAB: POC INR: 2.1

## 2009-12-11 ENCOUNTER — Ambulatory Visit: Payer: Self-pay | Admitting: Internal Medicine

## 2009-12-11 LAB — CONVERTED CEMR LAB: POC INR: 2.1

## 2009-12-25 ENCOUNTER — Encounter (INDEPENDENT_AMBULATORY_CARE_PROVIDER_SITE_OTHER): Payer: Self-pay | Admitting: Cardiology

## 2009-12-25 ENCOUNTER — Ambulatory Visit: Payer: Self-pay | Admitting: Cardiology

## 2010-01-05 ENCOUNTER — Ambulatory Visit: Payer: Self-pay | Admitting: Pulmonary Disease

## 2010-01-07 ENCOUNTER — Ambulatory Visit: Payer: Self-pay | Admitting: Pulmonary Disease

## 2010-01-07 ENCOUNTER — Ambulatory Visit: Payer: Self-pay | Admitting: Internal Medicine

## 2010-01-07 DIAGNOSIS — K603 Anal fistula: Secondary | ICD-10-CM

## 2010-01-08 LAB — CONVERTED CEMR LAB
ALT: 20 units/L (ref 0–35)
Albumin: 4 g/dL (ref 3.5–5.2)
Basophils Relative: 0.7 % (ref 0.0–3.0)
Chloride: 103 meq/L (ref 96–112)
Cholesterol: 160 mg/dL (ref 0–200)
Eosinophils Absolute: 0.1 10*3/uL (ref 0.0–0.7)
Eosinophils Relative: 1.9 % (ref 0.0–5.0)
GFR calc non Af Amer: 104.37 mL/min (ref >60)
HDL: 49.6 mg/dL (ref >39.00)
Hemoglobin: 13.3 g/dL (ref 12.0–15.0)
LDL Cholesterol: 94 mg/dL (ref 0–99)
Lymphocytes Relative: 28.4 % (ref 12.0–46.0)
MCHC: 33.9 g/dL (ref 30.0–36.0)
Monocytes Relative: 8.3 % (ref 3.0–12.0)
Neutrophils Relative %: 60.7 % (ref 43.0–77.0)
Potassium: 3.9 meq/L (ref 3.5–5.1)
RBC: 4.06 M/uL (ref 3.87–5.11)
Sodium: 142 meq/L (ref 135–145)
Total Protein: 7.1 g/dL (ref 6.0–8.3)
VLDL: 16.6 mg/dL (ref 0.0–40.0)
Vit D, 25-Hydroxy: 28 ng/mL — ABNORMAL LOW (ref 30–89)
WBC: 4.1 10*3/uL — ABNORMAL LOW (ref 4.5–10.5)

## 2010-01-13 ENCOUNTER — Ambulatory Visit: Payer: Self-pay | Admitting: Cardiovascular Disease

## 2010-01-13 ENCOUNTER — Encounter (INDEPENDENT_AMBULATORY_CARE_PROVIDER_SITE_OTHER): Payer: Self-pay | Admitting: Cardiology

## 2010-01-13 LAB — CONVERTED CEMR LAB: POC INR: 2.3

## 2010-01-16 ENCOUNTER — Telehealth: Payer: Self-pay | Admitting: Internal Medicine

## 2010-01-16 ENCOUNTER — Ambulatory Visit (HOSPITAL_COMMUNITY): Admission: RE | Admit: 2010-01-16 | Discharge: 2010-01-16 | Payer: Self-pay | Admitting: Internal Medicine

## 2010-01-19 ENCOUNTER — Other Ambulatory Visit: Admission: RE | Admit: 2010-01-19 | Discharge: 2010-01-19 | Payer: Self-pay | Admitting: Obstetrics and Gynecology

## 2010-01-19 ENCOUNTER — Ambulatory Visit: Payer: Self-pay | Admitting: Obstetrics and Gynecology

## 2010-01-19 ENCOUNTER — Encounter (INDEPENDENT_AMBULATORY_CARE_PROVIDER_SITE_OTHER): Payer: Self-pay | Admitting: *Deleted

## 2010-01-21 ENCOUNTER — Ambulatory Visit: Payer: Self-pay | Admitting: Internal Medicine

## 2010-01-21 ENCOUNTER — Telehealth: Payer: Self-pay | Admitting: Cardiovascular Disease

## 2010-01-22 ENCOUNTER — Telehealth (INDEPENDENT_AMBULATORY_CARE_PROVIDER_SITE_OTHER): Payer: Self-pay | Admitting: *Deleted

## 2010-01-27 ENCOUNTER — Ambulatory Visit: Payer: Self-pay | Admitting: Internal Medicine

## 2010-01-27 ENCOUNTER — Other Ambulatory Visit: Admission: RE | Admit: 2010-01-27 | Discharge: 2010-01-27 | Payer: Self-pay | Admitting: Internal Medicine

## 2010-01-28 ENCOUNTER — Encounter: Payer: Self-pay | Admitting: Internal Medicine

## 2010-02-04 ENCOUNTER — Telehealth: Payer: Self-pay | Admitting: Internal Medicine

## 2010-02-04 ENCOUNTER — Ambulatory Visit: Payer: Self-pay | Admitting: Internal Medicine

## 2010-02-04 LAB — CONVERTED CEMR LAB: POC INR: 2.7

## 2010-02-20 ENCOUNTER — Ambulatory Visit: Payer: Self-pay | Admitting: Cardiology

## 2010-03-09 ENCOUNTER — Ambulatory Visit: Payer: Self-pay | Admitting: Internal Medicine

## 2010-03-09 DIAGNOSIS — R160 Hepatomegaly, not elsewhere classified: Secondary | ICD-10-CM | POA: Insufficient documentation

## 2010-03-10 LAB — CONVERTED CEMR LAB
ALT: 13 units/L (ref 0–35)
Total Bilirubin: 0.8 mg/dL (ref 0.3–1.2)

## 2010-03-13 ENCOUNTER — Ambulatory Visit (HOSPITAL_COMMUNITY)
Admission: RE | Admit: 2010-03-13 | Discharge: 2010-03-13 | Payer: Self-pay | Source: Home / Self Care | Admitting: Internal Medicine

## 2010-03-13 ENCOUNTER — Ambulatory Visit: Payer: Self-pay | Admitting: Cardiology

## 2010-03-13 LAB — CONVERTED CEMR LAB: POC INR: 1.8

## 2010-03-17 ENCOUNTER — Ambulatory Visit: Payer: Self-pay | Admitting: Obstetrics and Gynecology

## 2010-04-03 ENCOUNTER — Ambulatory Visit: Payer: Self-pay | Admitting: Internal Medicine

## 2010-04-03 LAB — CONVERTED CEMR LAB: POC INR: 3

## 2010-04-07 ENCOUNTER — Encounter: Admission: RE | Admit: 2010-04-07 | Discharge: 2010-04-07 | Payer: Self-pay | Admitting: Pulmonary Disease

## 2010-04-22 ENCOUNTER — Ambulatory Visit: Payer: Self-pay | Admitting: Cardiology

## 2010-04-22 LAB — CONVERTED CEMR LAB: POC INR: 2.5

## 2010-04-27 ENCOUNTER — Encounter: Payer: Self-pay | Admitting: Pulmonary Disease

## 2010-05-20 ENCOUNTER — Ambulatory Visit: Payer: Self-pay | Admitting: Internal Medicine

## 2010-05-31 ENCOUNTER — Telehealth: Payer: Self-pay | Admitting: Adult Health

## 2010-06-01 ENCOUNTER — Telehealth: Payer: Self-pay | Admitting: Cardiovascular Disease

## 2010-06-04 ENCOUNTER — Ambulatory Visit: Payer: Self-pay | Admitting: Thoracic Surgery (Cardiothoracic Vascular Surgery)

## 2010-06-04 ENCOUNTER — Inpatient Hospital Stay (HOSPITAL_COMMUNITY): Admission: RE | Admit: 2010-06-04 | Discharge: 2010-06-16 | Payer: Self-pay | Admitting: Internal Medicine

## 2010-06-04 ENCOUNTER — Encounter: Payer: Self-pay | Admitting: Cardiovascular Disease

## 2010-06-04 ENCOUNTER — Ambulatory Visit: Payer: Self-pay | Admitting: Cardiovascular Disease

## 2010-06-04 ENCOUNTER — Telehealth (INDEPENDENT_AMBULATORY_CARE_PROVIDER_SITE_OTHER): Payer: Self-pay | Admitting: *Deleted

## 2010-06-04 ENCOUNTER — Encounter: Payer: Self-pay | Admitting: Thoracic Surgery (Cardiothoracic Vascular Surgery)

## 2010-06-05 HISTORY — PX: OTHER SURGICAL HISTORY: SHX169

## 2010-06-18 ENCOUNTER — Telehealth: Payer: Self-pay | Admitting: Cardiovascular Disease

## 2010-06-18 ENCOUNTER — Observation Stay (HOSPITAL_COMMUNITY): Admission: EM | Admit: 2010-06-18 | Discharge: 2010-06-23 | Payer: Self-pay | Admitting: Emergency Medicine

## 2010-06-18 ENCOUNTER — Encounter: Payer: Self-pay | Admitting: Cardiovascular Disease

## 2010-06-18 ENCOUNTER — Telehealth (INDEPENDENT_AMBULATORY_CARE_PROVIDER_SITE_OTHER): Payer: Self-pay | Admitting: *Deleted

## 2010-06-18 ENCOUNTER — Ambulatory Visit: Payer: Self-pay | Admitting: Cardiovascular Disease

## 2010-06-18 LAB — CONVERTED CEMR LAB: POC INR: 1.9

## 2010-06-19 ENCOUNTER — Encounter: Payer: Self-pay | Admitting: Cardiothoracic Surgery

## 2010-06-19 ENCOUNTER — Encounter: Payer: Self-pay | Admitting: Cardiovascular Disease

## 2010-06-23 ENCOUNTER — Telehealth: Payer: Self-pay | Admitting: Cardiovascular Disease

## 2010-06-25 ENCOUNTER — Encounter: Payer: Self-pay | Admitting: Cardiovascular Disease

## 2010-06-29 ENCOUNTER — Encounter: Payer: Self-pay | Admitting: Cardiology

## 2010-07-01 ENCOUNTER — Ambulatory Visit: Payer: Self-pay | Admitting: Diagnostic Radiology

## 2010-07-01 ENCOUNTER — Emergency Department (HOSPITAL_BASED_OUTPATIENT_CLINIC_OR_DEPARTMENT_OTHER)
Admission: EM | Admit: 2010-07-01 | Discharge: 2010-07-01 | Payer: Self-pay | Source: Home / Self Care | Admitting: Emergency Medicine

## 2010-07-01 ENCOUNTER — Telehealth: Payer: Self-pay | Admitting: Cardiovascular Disease

## 2010-07-02 ENCOUNTER — Ambulatory Visit: Payer: Self-pay | Admitting: Cardiovascular Disease

## 2010-07-03 ENCOUNTER — Encounter: Payer: Self-pay | Admitting: Cardiovascular Disease

## 2010-07-06 ENCOUNTER — Encounter: Payer: Self-pay | Admitting: Pulmonary Disease

## 2010-07-06 ENCOUNTER — Encounter: Payer: Self-pay | Admitting: Cardiology

## 2010-07-06 ENCOUNTER — Encounter
Admission: RE | Admit: 2010-07-06 | Discharge: 2010-07-06 | Payer: Self-pay | Admitting: Thoracic Surgery (Cardiothoracic Vascular Surgery)

## 2010-07-06 ENCOUNTER — Ambulatory Visit: Payer: Self-pay | Admitting: Thoracic Surgery (Cardiothoracic Vascular Surgery)

## 2010-07-06 LAB — CONVERTED CEMR LAB
POC INR: 3.1
Prothrombin Time: 31.3 s

## 2010-07-09 ENCOUNTER — Telehealth (INDEPENDENT_AMBULATORY_CARE_PROVIDER_SITE_OTHER): Payer: Self-pay | Admitting: *Deleted

## 2010-07-09 ENCOUNTER — Encounter: Payer: Self-pay | Admitting: Cardiovascular Disease

## 2010-07-09 ENCOUNTER — Telehealth: Payer: Self-pay | Admitting: Cardiovascular Disease

## 2010-07-09 ENCOUNTER — Encounter: Payer: Self-pay | Admitting: Internal Medicine

## 2010-07-10 ENCOUNTER — Telehealth: Payer: Self-pay | Admitting: Pulmonary Disease

## 2010-07-10 ENCOUNTER — Telehealth (INDEPENDENT_AMBULATORY_CARE_PROVIDER_SITE_OTHER): Payer: Self-pay | Admitting: *Deleted

## 2010-07-13 ENCOUNTER — Encounter: Payer: Self-pay | Admitting: Cardiology

## 2010-07-13 LAB — CONVERTED CEMR LAB: POC INR: 3.5

## 2010-07-14 ENCOUNTER — Ambulatory Visit: Payer: Self-pay | Admitting: Cardiology

## 2010-07-14 ENCOUNTER — Ambulatory Visit: Payer: Self-pay

## 2010-07-14 ENCOUNTER — Ambulatory Visit (HOSPITAL_COMMUNITY): Admission: RE | Admit: 2010-07-14 | Discharge: 2010-07-14 | Payer: Self-pay | Admitting: Cardiovascular Disease

## 2010-07-14 ENCOUNTER — Encounter: Payer: Self-pay | Admitting: Cardiovascular Disease

## 2010-07-16 ENCOUNTER — Ambulatory Visit: Payer: Self-pay | Admitting: Cardiovascular Disease

## 2010-07-16 DIAGNOSIS — I5033 Acute on chronic diastolic (congestive) heart failure: Secondary | ICD-10-CM

## 2010-07-21 ENCOUNTER — Telehealth: Payer: Self-pay | Admitting: Cardiovascular Disease

## 2010-07-21 ENCOUNTER — Encounter: Payer: Self-pay | Admitting: Cardiovascular Disease

## 2010-07-21 LAB — CONVERTED CEMR LAB: POC INR: 3.19

## 2010-07-22 ENCOUNTER — Telehealth: Payer: Self-pay | Admitting: Cardiovascular Disease

## 2010-07-22 ENCOUNTER — Encounter: Payer: Self-pay | Admitting: Cardiovascular Disease

## 2010-07-23 ENCOUNTER — Ambulatory Visit: Payer: Self-pay | Admitting: Cardiovascular Disease

## 2010-07-24 LAB — CONVERTED CEMR LAB
BUN: 12 mg/dL (ref 6–23)
Chloride: 104 meq/L (ref 96–112)
Creatinine, Ser: 0.6 mg/dL (ref 0.4–1.2)
Pro B Natriuretic peptide (BNP): 617.4 pg/mL — ABNORMAL HIGH (ref 0.0–100.0)

## 2010-07-28 ENCOUNTER — Encounter: Payer: Self-pay | Admitting: Cardiovascular Disease

## 2010-08-03 ENCOUNTER — Telehealth: Payer: Self-pay | Admitting: Cardiovascular Disease

## 2010-08-03 ENCOUNTER — Ambulatory Visit: Payer: Self-pay | Admitting: Cardiovascular Disease

## 2010-08-03 LAB — CONVERTED CEMR LAB
Basophils Relative: 1.3 % (ref 0.0–3.0)
Eosinophils Absolute: 0.2 10*3/uL (ref 0.0–0.7)
Lymphs Abs: 1.2 10*3/uL (ref 0.7–4.0)
MCHC: 33 g/dL (ref 30.0–36.0)
MCV: 93.6 fL (ref 78.0–100.0)
Monocytes Absolute: 0.4 10*3/uL (ref 0.1–1.0)
Neutro Abs: 3.3 10*3/uL (ref 1.4–7.7)
Neutrophils Relative %: 62.8 % (ref 43.0–77.0)
RBC: 3.67 M/uL — ABNORMAL LOW (ref 3.87–5.11)

## 2010-08-04 ENCOUNTER — Telehealth (INDEPENDENT_AMBULATORY_CARE_PROVIDER_SITE_OTHER): Payer: Self-pay | Admitting: *Deleted

## 2010-08-07 ENCOUNTER — Encounter: Payer: Self-pay | Admitting: Internal Medicine

## 2010-08-11 ENCOUNTER — Encounter: Payer: Self-pay | Admitting: Cardiovascular Disease

## 2010-08-19 ENCOUNTER — Telehealth: Payer: Self-pay | Admitting: Cardiovascular Disease

## 2010-08-20 ENCOUNTER — Ambulatory Visit: Payer: Self-pay | Admitting: Pulmonary Disease

## 2010-08-21 ENCOUNTER — Ambulatory Visit: Payer: Self-pay | Admitting: Cardiology

## 2010-08-31 ENCOUNTER — Telehealth: Payer: Self-pay | Admitting: Cardiovascular Disease

## 2010-08-31 ENCOUNTER — Encounter: Payer: Self-pay | Admitting: Cardiovascular Disease

## 2010-09-03 ENCOUNTER — Ambulatory Visit: Payer: Self-pay | Admitting: Cardiovascular Disease

## 2010-09-04 ENCOUNTER — Ambulatory Visit: Payer: Self-pay | Admitting: Pulmonary Disease

## 2010-09-04 DIAGNOSIS — R609 Edema, unspecified: Secondary | ICD-10-CM

## 2010-09-04 DIAGNOSIS — I2510 Atherosclerotic heart disease of native coronary artery without angina pectoris: Secondary | ICD-10-CM | POA: Insufficient documentation

## 2010-09-04 DIAGNOSIS — R1319 Other dysphagia: Secondary | ICD-10-CM | POA: Insufficient documentation

## 2010-09-11 ENCOUNTER — Encounter: Payer: Self-pay | Admitting: Cardiovascular Disease

## 2010-09-14 ENCOUNTER — Ambulatory Visit: Payer: Self-pay | Admitting: Cardiovascular Disease

## 2010-09-14 LAB — CONVERTED CEMR LAB
ALT: 12 units/L (ref 0–35)
AST: 24 units/L (ref 0–37)
Alkaline Phosphatase: 62 units/L (ref 39–117)
BUN: 18 mg/dL (ref 6–23)
Bilirubin, Direct: 0.3 mg/dL (ref 0.0–0.3)
Creatinine, Ser: 0.7 mg/dL (ref 0.4–1.2)
GFR calc non Af Amer: 93.32 mL/min (ref >60)
Pro B Natriuretic peptide (BNP): 974.8 pg/mL — ABNORMAL HIGH (ref 0.0–100.0)
Total Protein: 6.9 g/dL (ref 6.0–8.3)

## 2010-09-17 ENCOUNTER — Ambulatory Visit: Payer: Self-pay | Admitting: Cardiovascular Disease

## 2010-09-18 ENCOUNTER — Encounter: Payer: Self-pay | Admitting: Cardiovascular Disease

## 2010-09-24 ENCOUNTER — Ambulatory Visit: Payer: Self-pay | Admitting: Cardiology

## 2010-09-24 ENCOUNTER — Ambulatory Visit: Payer: Self-pay | Admitting: Cardiovascular Disease

## 2010-09-28 ENCOUNTER — Telehealth: Payer: Self-pay | Admitting: Cardiovascular Disease

## 2010-10-05 ENCOUNTER — Ambulatory Visit: Payer: Self-pay | Admitting: Cardiovascular Disease

## 2010-10-05 ENCOUNTER — Encounter: Payer: Self-pay | Admitting: Cardiovascular Disease

## 2010-10-05 LAB — CONVERTED CEMR LAB
BUN: 23 mg/dL (ref 6–23)
Creatinine, Ser: 0.8 mg/dL (ref 0.4–1.2)
GFR calc non Af Amer: 80.51 mL/min (ref >60)
Glucose, Bld: 94 mg/dL (ref 70–99)
Pro B Natriuretic peptide (BNP): 559.8 pg/mL — ABNORMAL HIGH (ref 0.0–100.0)

## 2010-10-12 ENCOUNTER — Ambulatory Visit: Payer: Self-pay | Admitting: Thoracic Surgery (Cardiothoracic Vascular Surgery)

## 2010-10-12 ENCOUNTER — Encounter
Admission: RE | Admit: 2010-10-12 | Discharge: 2010-10-12 | Payer: Self-pay | Admitting: Thoracic Surgery (Cardiothoracic Vascular Surgery)

## 2010-10-12 ENCOUNTER — Encounter: Payer: Self-pay | Admitting: Pulmonary Disease

## 2010-10-15 ENCOUNTER — Encounter
Admission: RE | Admit: 2010-10-15 | Discharge: 2010-12-15 | Payer: Self-pay | Source: Home / Self Care | Attending: Cardiovascular Disease | Admitting: Cardiovascular Disease

## 2010-10-15 DEATH — deceased

## 2010-10-22 ENCOUNTER — Ambulatory Visit: Payer: Self-pay | Admitting: Internal Medicine

## 2010-10-26 ENCOUNTER — Encounter: Payer: Self-pay | Admitting: Cardiovascular Disease

## 2010-10-30 ENCOUNTER — Ambulatory Visit: Payer: Self-pay

## 2010-11-04 ENCOUNTER — Ambulatory Visit: Payer: Self-pay | Admitting: Cardiovascular Disease

## 2010-11-04 ENCOUNTER — Ambulatory Visit: Payer: Self-pay | Admitting: Cardiology

## 2010-11-04 LAB — CONVERTED CEMR LAB: POC INR: 1.4

## 2010-11-06 LAB — CONVERTED CEMR LAB
Calcium: 9 mg/dL (ref 8.4–10.5)
Creatinine, Ser: 0.8 mg/dL (ref 0.4–1.2)
GFR calc non Af Amer: 80.49 mL/min (ref >60.00)
Glucose, Bld: 102 mg/dL — ABNORMAL HIGH (ref 70–99)
Sodium: 139 meq/L (ref 135–145)

## 2010-11-13 ENCOUNTER — Ambulatory Visit: Payer: Self-pay | Admitting: Cardiovascular Disease

## 2010-11-13 LAB — CONVERTED CEMR LAB: POC INR: 1.7

## 2010-11-17 ENCOUNTER — Encounter: Payer: Self-pay | Admitting: Cardiovascular Disease

## 2010-11-17 ENCOUNTER — Encounter: Payer: Self-pay | Admitting: Pulmonary Disease

## 2010-11-23 ENCOUNTER — Telehealth: Payer: Self-pay | Admitting: Cardiovascular Disease

## 2010-11-23 ENCOUNTER — Encounter: Payer: Self-pay | Admitting: Cardiovascular Disease

## 2010-11-27 ENCOUNTER — Ambulatory Visit: Admission: RE | Admit: 2010-11-27 | Discharge: 2010-11-27 | Payer: Self-pay | Source: Home / Self Care

## 2010-11-27 LAB — CONVERTED CEMR LAB: POC INR: 1.8

## 2010-12-05 ENCOUNTER — Encounter: Payer: Self-pay | Admitting: Oncology

## 2010-12-11 ENCOUNTER — Ambulatory Visit: Admission: RE | Admit: 2010-12-11 | Discharge: 2010-12-11 | Payer: Self-pay | Source: Home / Self Care

## 2010-12-15 NOTE — Progress Notes (Signed)
Summary: Abnormal heart beats  Phone Note Call from Patient Call back at Home Phone [phone] Call back at [phone]   Caller: Patient Summary of Call: Pt was having abnormal heart rhythm yesterday would like a call Initial call taken by: Judie Grieve,  June 01, 2010 8:26 AM  Follow-up for Phone Call        spoke w/pt she states she has not went back into a-fib since yest but can feel pvcs occ. yest episode wiped her out and she took a nap yest pm, pt is aware ok to take extra 1/2 dose of lopressor if goes back into a-fib, if does not break or feels bad can call back or go to ER, pt is on coumadin and HR is controlled in the 70s even during episode yest.  appt sch w/Dr Excell Seltzer for 7/25 at 10am Follow-up by: Meredith Staggers, RN,  June 01, 2010 8:56 AM

## 2010-12-15 NOTE — Progress Notes (Signed)
Summary: rx refill  Phone Note Refill Request Message from:  Patient on September 28, 2010 8:54 AM  Refills Requested: Medication #1:  FUROSEMIDE 80 MG TABS Take one tablet by mouth two times a day pt needs refill to be called in to [facility] [phone]   Method Requested: Telephone to Pharmacy Initial call taken by: Roe Coombs,  September 28, 2010 8:55 AM    Prescriptions: FUROSEMIDE 80 MG TABS (FUROSEMIDE) Take one tablet by mouth two times a day  #180 x 3   Entered by:   Hardin Negus, RMA   Authorized by:   Norva Karvonen, MD   Signed by:   Hardin Negus, RMA on 09/28/2010   Method used:   Electronically to        [facility] (retail)       [address]       [place], [place]  [address]       Ph: [phone]       Fax: [phone]   RxID:   [id number]

## 2010-12-15 NOTE — Medication Information (Signed)
Summary: rov/sl  Anticoagulant Therapy  Managed by: [name], PharmD Referring MD: Tonny Bollman PCP: Alroy Dust, MD Supervising MD: Excell Seltzer MD, Casimiro Needle Indication 1: Atrial Fibrillation (ICD-427.31) Lab Used: [facility] Site: [address] INR POC 2.1 INR RANGE 2 - 3  Dietary changes: no    Health status changes: no    Bleeding/hemorrhagic complications: yes       Details: Nosebleed yesterday and bruise/knot on forehead.   Recent/future hospitalizations: no    Any changes in medication regimen? no    Recent/future dental: no  Any missed doses?: no       Is patient compliant with meds? yes      Comments: INR checked today following MD appt due to nosebleed yesterday and unusual bruise on forehead.   Allergies: 1)  ! Demerol 2)  ! Codeine 3)  ! Zocor 4)  ! Lipitor 5)  ! Requip (Ropinirole Hcl) 6)  ! Zetia (Ezetimibe) 7)  ! * Emycin 8)  ! Morphine  Anticoagulation Management History:      The patient is taking warfarin and comes in today for a routine follow up visit.  Positive risk factors for bleeding include an age of 73 years or older.  The bleeding index is 'intermediate risk'.  Positive CHADS2 values include History of CHF and History of HTN.  Negative CHADS2 values include Age > 14 years old.  The start date was 05/13/1999.  Anticoagulation responsible provider: Excell Seltzer MD, Casimiro Needle.  INR POC: 2.1.  Cuvette Lot#: [id number].  Exp: 09/2011.    Anticoagulation Management Assessment/Plan:      The patient's current anticoagulation dose is Coumadin 5 mg tabs: as directed.  The target INR is 2 - 3.  The next INR is due 10/30/2010.  Anticoagulation instructions were given to patient.  Results were reviewed/authorized by [name], PharmD.  She was notified by [name] PharmD.         Prior Anticoagulation Instructions: INR 2.4  Continue taking Coumadin 0.5 tab (2.5 mg) every day. Return to clinic in 4 weeks.    Current Anticoagulation Instructions: INR  2.1  Continue taking Coumadin 0.5 tab (2.5 mg) every day. Return to clinic in 4 weeks.

## 2010-12-15 NOTE — Progress Notes (Signed)
Summary: wants to be seen sooner  Phone Note Call from Patient   Caller: Daughter [name] Reason for Call: Talk to Nurse Summary of Call: pt's dtr [name] calling to say pt went back to hospital and was told she should be seen sooner than 9-1, however dur to transportation and her husband being her caretaker they need to come together-so dtr only wants pt to come earlier if we can get her husband in at t he same time-pls call [phone] Initial call taken by: Glynda Jaeger,  June 23, 2010 10:46 AM  Follow-up for Phone Call        I spoke with the pt's daughter and at this time Dr Earmon Phoenix schedule does not have availability to get both parents in at the same time.  Misty Stanley would like to leave the appts on 07/16/10.  I advised Misty Stanley to call the office is she had any questions or concerns about pt.  Misty Stanley agreed with plans.  Follow-up by: Julieta Gutting, RN, BSN,  June 23, 2010 11:45 AM

## 2010-12-15 NOTE — Miscellaneous (Signed)
Summary: diflucan-rx  Clinical Lists Changes  Medications: Added new medication of DIFLUCAN 100 MG  TABS (FLUCONAZOLE) one by mouth daily for 7 days - Signed Added new medication of NITROSTAT 0.4 MG SUBL (NITROGLYCERIN) take one sublingual for esophageal spasms prn - Signed Rx of DIFLUCAN 100 MG  TABS (FLUCONAZOLE) one by mouth daily for 7 days;  #7 x 0;  Signed;  Entered by: Greer Ee RN;  Authorized by: Hart Carwin MD;  Method used: Electronically to [facility] [address] [place], [place], [place]  [address], Ph: [phone], Fax: [phone] Rx of NITROSTAT 0.4 MG SUBL (NITROGLYCERIN) take one sublingual for esophageal spasms prn;  #30 x 1;  Signed;  Entered by: Greer Ee RN;  Authorized by: Hart Carwin MD;  Method used: Electronically to [facility] [address] [place], [place], [place]  [address], Ph: [phone], Fax: [phone]    Prescriptions: NITROSTAT 0.4 MG SUBL (NITROGLYCERIN) take one sublingual for esophageal spasms prn  #30 x 1   Entered by:   Greer Ee RN   Authorized by:   Hart Carwin MD   Signed by:   Greer Ee RN on 01/27/2010   Method used:   Electronically to        [facility] (retail)       [address]       [place], [place]  [address]       Ph: [phone]       Fax: [phone]   RxID:   [id number] DIFLUCAN 100 MG  TABS (FLUCONAZOLE) one by mouth daily for 7 days  #7 x 0   Entered by:   Greer Ee RN   Authorized by:   Hart Carwin MD   Signed by:   Greer Ee RN on 01/27/2010   Method used:   Electronically to        [facility] (retail)       [address]       [place], [place]  [address]       Ph: [phone]       Fax: [phone]   RxID:   [id number]

## 2010-12-15 NOTE — Progress Notes (Signed)
Summary: coumadin /procedure (review w/ DOD)  Phone Note Call from Patient Call back at Home Phone [phone] Call back at [phone]   Caller: Patient Reason for Call: Talk to Nurse Summary of Call: endo procedure on 3/15, will need to come off of coumadin Initial call taken by: Migdalia Dk,  January 21, 2010 10:40 AM  Follow-up for Phone Call        The pt has a history of a surgical repair of an ASD in 1982 and a history of a-fib. I will have Dr. Eden Emms (DOD) review since Dr. Excell Seltzer is out of the office until friday. Sherri Rad, RN, BSN  January 21, 2010 11:09 AM   Additional Follow-up for Phone Call Additional follow up Details #1::        Ok to come off coumadin 5 days before procedure with no overlap Additional Follow-up by: Colon Branch, MD, [facility],  January 21, 2010 1:45 PM     Appended Document: coumadin /procedure (review w/ DOD) pt is aware

## 2010-12-15 NOTE — Letter (Signed)
Summary: Cardiac Rehab Program - Medical Referral  Cardiac Rehab Program - Medical Referral   Imported By: Marylou Mccoy 09/07/2010 08:09:01  _____________________________________________________________________  External Attachment:    Type:   Image     Comment:   External Document

## 2010-12-15 NOTE — Letter (Signed)
Summary: [name]   Imported By: Marylou Mccoy 08/21/2010 13:49:55  _____________________________________________________________________  External Attachment:    Type:   Image     Comment:   External Document

## 2010-12-15 NOTE — Assessment & Plan Note (Signed)
Summary: flu shot/ mbw  Nurse Visit   Allergies: 1)  ! Demerol 2)  ! Codeine 3)  ! Zocor 4)  ! Lipitor 5)  ! Requip (Ropinirole Hcl) 6)  ! Zetia (Ezetimibe) 7)  ! * Emycin 8)  ! Morphine  Orders Added: 1)  Flu Vaccine 57yrs + MEDICARE PATIENTS [Q2039] 2)  Administration Flu vaccine - MCR [G0008] Flu Vaccine Consent Questions     Do you have a history of severe allergic reactions to this vaccine? no    Any prior history of allergic reactions to egg and/or gelatin? no    Do you have a sensitivity to the preservative Thimersol? no    Do you have a past history of Guillan-Barre Syndrome? no    Do you currently have an acute febrile illness? no    Have you ever had a severe reaction to latex? no    Vaccine information given and explained to patient? yes    Are you currently pregnant? no    Lot Number:[id number]   Exp Date:05/15/2011   Site Given  Left Deltoid IMmedflu  Tammy Jori Frerichs  August 20, 2010 10:31 AM

## 2010-12-15 NOTE — Medication Information (Signed)
Summary: rov/ewj  Anticoagulant Therapy  Managed by: Eda Keys, PharmD Referring MD: Tonny Bollman PCP: Alroy Dust, MD Supervising MD: Tenny Craw MD, Gunnar Fusi Indication 1: Atrial Fibrillation (ICD-427.31) Lab Used: [facility] Site: [facility] INR POC 3.0 INR RANGE 2 - 3  Dietary changes: no    Health status changes: no    Bleeding/hemorrhagic complications: no    Recent/future hospitalizations: no    Any changes in medication regimen? no    Recent/future dental: no  Any missed doses?: no       Is patient compliant with meds? yes       Current Medications (verified): 1)  Fluticasone Propionate 50 Mcg/act Susp (Fluticasone Propionate) .[facility].. 1-2 Sp in Each Nostril At Bedtime.[facility] KitchenMarland Kitchen 2)  Coumadin 5 Mg Tabs (Warfarin Sodium) .... Take As Directed By [facility] 3)  Nitrostat 0.4 Mg Subl (Nitroglycerin) .... Use As Needed Chest Pain 4)  Digoxin 0.25 Mg  Tabs (Digoxin) .... Take 1 Tablet By Mouth Once A Day 5)  Metoprolol Tartrate 50 Mg  Tabs (Metoprolol Tartrate) .... Take One Tablet By Mouth Two Times A Day 6)  Quinapril Hcl 20 Mg  Tabs (Quinapril Hcl) .... Take 1 Tablet By Mouth Once A Day 7)  Triamterene-Hctz 37.5-25 Mg  Tabs (Triamterene-Hctz) .... Take 1 Tablet By Mouth Once A Day 8)  Klor-Con M20 20 Meq  Tbcr (Potassium Chloride Crys Cr) .... Take 1 Tablet By Mouth Once A Day 9)  Crestor 10 Mg Tabs (Rosuvastatin Calcium) .... Take One-Half  Tablet By Mouth Daily. 10)  Fish Oil Concentrate 1000 Mg  Caps (Omega-3 Fatty Acids) .... Two Times A Day 11)  Gabapentin 300 Mg Caps (Gabapentin) .... Take 1 Cap By Mouth Two Times A Day... 12)  Tramadol Hcl 50 Mg Tabs (Tramadol Hcl) .... Take One Tablet By Mouth Three Times A Day As Needed For Pain 13)  Zoloft 50 Mg Tabs (Sertraline Hcl) .... Take One Daily 14)  Ambien 10 Mg Tabs (Zolpidem Tartrate) .... Take 1/2 To 1  Tablet By Mouth At Bedtime 15)  Tums 500 Mg Chew (Calcium Carbonate Antacid) .... One By Mouth As  Needed 16)  Ocuvite  Tabs (Multiple Vitamins-Minerals) .... Take 1 Tablet By Mouth Two Times A Day 17)  Stool Softener 100 Mg Caps (Docusate Sodium) .... One Capsule By Mouth At Bedtime 18)  Benefiber  Powd (Wheat Dextrin) .... As Needed 19)  Anusol-Hc 25 Mg Supp (Hydrocortisone Acetate) .... Insert 1 Suppository Into Rectum At Bedtime 20)  Nystatin 100000 Unit/ml Susp (Nystatin) .... Swish and Swallow 5 Cc By Mouth Three Times A Day X 3 Days. Then Repeat Monthly 21)  Prilosec 20 Mg Cpdr (Omeprazole) .... Take 1 Capsule By Mouth Two Times A Day 22)  Vitamin D 1000 Unit  Tabs (Cholecalciferol) .... Take 1 Tablet By Mouth Once A Day 23)  Nitrostat 0.4 Mg Subl (Nitroglycerin) .... Take One Sublingual For Esophageal Spasms Prn  Allergies: 1)  ! Demerol 2)  ! Codeine 3)  ! Zocor 4)  ! Lipitor 5)  ! Requip (Ropinirole Hcl) 6)  ! Zetia (Ezetimibe) 7)  ! * Emycin  Anticoagulation Management History:      The patient is taking warfarin and comes in today for a routine follow up visit.  Positive risk factors for bleeding include an age of 74 years or older.  The bleeding index is 'intermediate risk'.  Positive CHADS2 values include History of HTN.  Negative CHADS2 values include Age > 80 years old.  The start date was 05/13/1999.  Anticoagulation responsible provider: Tenny Craw MD, Gunnar Fusi.  INR POC: 3.0.  Cuvette Lot#: [id number].  Exp: 06/2011.    Anticoagulation Management Assessment/Plan:      The patient's current anticoagulation dose is Coumadin 5 mg tabs: take as directed by the [facility]....  The target INR is 2 - 3.  The next INR is due 04/24/2010.  Anticoagulation instructions were given to patient.  Results were reviewed/authorized by Eda Keys, PharmD.  She was notified by Eda Keys.         Prior Anticoagulation Instructions: INR 1.8  Take 2 tablets today, then start taking 1 tablet daily except 1.5 tablets on Mondays, Wednesdays, and Fridays.  Recheck in 3 weeks.    Current  Anticoagulation Instructions: INR 3.0  Take 1 tablet today.  Then return to normal dosing schedule of 1.5 tablets (7.5 mg) on Monday, Wednesday, and Friday, and take 1 tablet (5 mg) all other days.  Return to clinic in 3 weeks.

## 2010-12-15 NOTE — Medication Information (Signed)
Summary: rov/cb  Anticoagulant Therapy  Managed by: Weston Brass, PharmD Referring MD: Tonny Bollman PCP: Alroy Dust, MD Supervising MD: Johney Frame MD, Fayrene Fearing Indication 1: Atrial Fibrillation (ICD-427.31) Lab Used: [facility] Site: [facility] INR POC 2.7 INR RANGE 2 - 3  Dietary changes: no    Health status changes: no    Bleeding/hemorrhagic complications: no    Recent/future hospitalizations: no    Any changes in medication regimen? no    Recent/future dental: no  Any missed doses?: no       Is patient compliant with meds? yes       Allergies: 1)  ! Demerol 2)  ! Codeine 3)  ! Zocor 4)  ! Lipitor 5)  ! Requip (Ropinirole Hcl) 6)  ! Zetia (Ezetimibe) 7)  ! * Emycin  Anticoagulation Management History:      Positive risk factors for bleeding include an age of 74 years or older.  The bleeding index is 'intermediate risk'.  Positive CHADS2 values include History of HTN.  Negative CHADS2 values include Age > 31 years old.  The start date was 05/13/1999.  Anticoagulation responsible provider: Jawanda Passey MD, Fayrene Fearing.  INR POC: 2.7.  Cuvette Lot#: [id number].  Exp: 07/2011.    Anticoagulation Management Assessment/Plan:      The patient's current anticoagulation dose is Coumadin 5 mg tabs: take as directed by the [facility]....  The target INR is 2 - 3.  The next INR is due 06/17/2010.  Anticoagulation instructions were given to patient.  Results were reviewed/authorized by Weston Brass, PharmD.  She was notified by Dillard Cannon.         Prior Anticoagulation Instructions: INR 2.5. Take 1 tablet daily except 1.5 tablets on Mon, Wed, Fri.  Recheck in 4 weeks.  Current Anticoagulation Instructions: INR 2.7  Continue same regimen of 1.5 tabs on M,W,F and 1 tab all other days.

## 2010-12-15 NOTE — Progress Notes (Signed)
Summary: refill request  Phone Note Refill Request Message from:  Patient on August 04, 2010 9:26 AM  Refills Requested: Medication #1:  AMIODARONE HCL 200 MG TABS Take one tablet by mouth once a day [facility]   Method Requested: Telephone to Pharmacy Initial call taken by: Glynda Jaeger,  August 04, 2010 9:27 AM  Follow-up for Phone Call        Rx faxed to pharmacy. Vikki Ports  August 04, 2010 12:25 PM     Prescriptions: AMIODARONE HCL 200 MG TABS (AMIODARONE HCL) Take one tablet by mouth once a day  #30 x 6   Entered by:   Vikki Ports   Authorized by:   Norva Karvonen, MD   Signed by:   Vikki Ports on 08/04/2010   Method used:   Faxed to ...       [facility]       [address]       [place], [place]  [address]       Ph: [phone]       Fax: [phone]   RxID:   [id number]

## 2010-12-15 NOTE — Progress Notes (Signed)
Summary: Hemoptysis/starting Avelox  Phone Note Call from Patient   Caller: Patient Call For: [facility] Summary of Call: Pt called stating Dr Jodelle Green office is rx pt Avelox x 7 days.  Pt held last night's dose of Coumadin secondary to hemoptysis and INR 4.05.  Pt is hesitant to take today's dose of Coumadin because she is still coughing up blood this am.  Per Weston Brass, Pharm D ok to hold today's dosage of Coumadin since starting on Avelox.  Resume tomorrow at decr dosage of 1/2 tablet daily except 1 tablet on Mondays and Fridays.  Rechecking PT/INR on Monday 07/13/10.   Initial call taken by: Cloyde Reams RN,  July 10, 2010 9:30 AM

## 2010-12-15 NOTE — Miscellaneous (Signed)
Summary: [facility] Previsit/prep  Clinical Lists Changes  Observations: Added new observation of ALLERGY REV: Done (01/21/2010 13:52)

## 2010-12-15 NOTE — Progress Notes (Signed)
Summary: Question about taking Aspirin  Phone Note Call from Patient Call back at Home Phone [phone]   Caller: Patient Summary of Call: Question about taking Aspirin Initial call taken by: Judie Grieve,  August 03, 2010 4:21 PM  Follow-up for Phone Call        The pt called because she was wondering if she is suppose to be taking an ASA.  I will ask Dr Excell Seltzer and call the pt back. Julieta Gutting, RN, BSN  August 03, 2010 4:51 PM  Per Dr Excell Seltzer the pt can start ASA 81mg  once a day. Pt aware.   Follow-up by: Julieta Gutting, RN, BSN,  August 03, 2010 6:16 PM    New/Updated Medications: ASPIRIN 81 MG TBEC (ASPIRIN) Take one tablet by mouth daily

## 2010-12-15 NOTE — Medication Information (Signed)
Summary: Zolpidem/[facility]  Zolpidem/[facility]   Imported By: Sherian Rein 05/04/2010 11:02:56  _____________________________________________________________________  External Attachment:    Type:   Image     Comment:   External Document

## 2010-12-15 NOTE — Assessment & Plan Note (Signed)
Summary: F/U Reflux problems, saw Amy Esterwood PA-C   History of Present Illness Visit Type: Follow-up Visit Primary GI MD: Lina Sar MD Primary Provider: Alroy Dust, MD Requesting Provider: n/a Chief Complaint: F/u for knot on rectum. Pt states that she is better and c/o GERD today  History of Present Illness:   This is a 76 year old female whom I have seen in the past for a colonoscopy in 2009 which was normal. She recently came to see [name], PA-C complaining of a small knot outside of her rectum which was not painful but excreated whitish mucous and a small amount of blood. She had a rectocele which was repaired in 1987 then recurred and she now has to use manual maneuvers to evacuate the rectum. Patient had no fever, chills or abdominal pain at that time. It was felt at the time of her examination that the patient had a anal fistula. She was given a course of flagyl and was asked to do sitz baths daily. Patient comes today for a follow up. The rectal drainage has ceased and is no longer a problem. She is complaining of both dysphagia and odynophagia. She has a coated tongue and suspected yeast infection. She had candida esophagitis on an upper endoscopy in September 2009.   GI Review of Systems    Reports acid reflux and  heartburn.      Denies abdominal pain, belching, bloating, chest pain, dysphagia with liquids, dysphagia with solids, loss of appetite, nausea, vomiting, vomiting blood, weight loss, and  weight gain.        Denies anal fissure, black tarry stools, change in bowel habit, constipation, diarrhea, diverticulosis, fecal incontinence, heme positive stool, hemorrhoids, irritable bowel syndrome, jaundice, light color stool, liver problems, rectal bleeding, and  rectal pain.    Current Medications (verified): 1)  Fluticasone Propionate 50 Mcg/act Susp (Fluticasone Propionate) .Marland Kitchen.. 1-2 Sp in Each Nostril At Bedtime.Marland KitchenMarland Kitchen 2)  Coumadin 5 Mg Tabs (Warfarin Sodium) .... Take  As Directed By [facility].Marland KitchenMarland Kitchen 3)  Nitrostat 0.4 Mg Subl (Nitroglycerin) .... Use As Needed Chest Pain 4)  Digoxin 0.25 Mg  Tabs (Digoxin) .... Take 1 Tablet By Mouth Once A Day 5)  Metoprolol Tartrate 50 Mg  Tabs (Metoprolol Tartrate) .... Take One Tablet By Mouth Two Times A Day 6)  Quinapril Hcl 20 Mg  Tabs (Quinapril Hcl) .... Take 1 Tablet By Mouth Once A Day 7)  Triamterene-Hctz 37.5-25 Mg  Tabs (Triamterene-Hctz) .... Take 1 Tablet By Mouth Once A Day 8)  Klor-Con M20 20 Meq  Tbcr (Potassium Chloride Crys Cr) .... Take 1 Tablet By Mouth Once A Day 9)  Crestor 5 Mg  Tabs (Rosuvastatin Calcium) .... Take 1/2 Tablet By Mouth Once A Day 10)  Fish Oil Concentrate 1000 Mg  Caps (Omega-3 Fatty Acids) .... Two Times A Day 11)  Prilosec 20 Mg Cpdr (Omeprazole) .... Take 1 Tab By Mouth Two Times A Day 30 Min Before Meals... 12)  Gabapentin 300 Mg Caps (Gabapentin) .... Take 1 Cap By Mouth Two Times A Day... 13)  Tramadol Hcl 50 Mg Tabs (Tramadol Hcl) .... Take One Tablet By Mouth Three Times A Day As Needed For Pain 14)  Zoloft 50 Mg Tabs (Sertraline Hcl) .... Take One Daily 15)  Ambien 10 Mg Tabs (Zolpidem Tartrate) .... Take 1/2 To 1  Tablet By Mouth At Bedtime 16)  Tums 500 Mg Chew (Calcium Carbonate Antacid) .... One By Mouth As Needed 17)  Ocuvite  Tabs (Multiple Vitamins-Minerals) .... Take 1 Tablet By Mouth Two Times A Day 18)  Stool Softener 100 Mg Caps (Docusate Sodium) .... One Capsule By Mouth At Bedtime 19)  Benefiber  Powd (Wheat Dextrin) .... As Needed  Allergies (verified): 1)  ! Demerol 2)  ! Codeine 3)  ! Zocor 4)  ! Lipitor 5)  ! Requip (Ropinirole Hcl) 6)  ! Zetia (Ezetimibe) 7)  ! Pollyann Samples  Past History:  Past Medical History: PAROXYSMAL ATRIAL FIBRILLATION (ICD-427.31) TRANSIENT ISCHEMIC ATTACKS, HX OF (ICD-V12.50) HYPERTENSION (ICD-401.9) HYPERCHOLESTEROLEMIA (ICD-272.0) VENOUS INSUFFICIENCY (ICD-459.81) PERIPHERAL NEUROPATHY (ICD-356.9) ACUTE MAXILLARY  SINUSITIS (ICD-461.0) OBSTRUCTIVE SLEEP APNEA (ICD-327.23) Hx of ATRIAL SEPTAL DEFECT (ICD-745.5) DIABETES MELLITUS, BORDERLINE (ICD-790.29) GERD (ICD-530.81) DYSPHAGIA (ICD-787.29) Hx of ESOPHAGEAL STRICTURE (ICD-530.3) IRRITABLE BOWEL SYNDROME, HX OF (ICD-V12.79) Family Hx of COLON CANCER (ICD-153.9) DEGENERATIVE JOINT DISEASE (ICD-715.90) BACK PAIN, LUMBAR (ICD-724.2) FIBROMYALGIA (ICD-729.1) ANXIETY DEPRESSION (ICD-300.4)  Past Surgical History: Reviewed history from 01/05/2010 and no changes required. S/P secundum ASD repair at [facility] in 1982 S/P Vag Hysterectomy in 1987 w/ cystocele and rectocele repair, & ovarian surg in 1993 S/P Rt rotator cuff surgery in 2007 DC Cardioversion x 3 Tubal Ligation Appendectomy  Family History: mother died age 53 --hx of als and dm father died age 16 from car accident 6 siblings 1 sister died age 73  hx of melanoma lungs/clot post-op 1 sister died age 23 pancreatic cancer 1 brother died age 51 from heart attack 1 brother died from heart problems and lung cancer 1 brother died from heart problems 1 brother alive age 23--hx of heart problems and new dx lung cancer 06-2008 No FH of Colon Cancer:  Social History: Reviewed history from 07/19/2009 and no changes required. 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  Alcohol Use - no Illicit Drug Use - no Daily Caffeine Use Patient gets regular exercise.  Review of Systems  The patient denies allergy/sinus, anemia, anxiety-new, arthritis/joint pain, back pain, blood in urine, breast changes/lumps, change in vision, confusion, cough, coughing up blood, depression-new, fainting, fatigue, fever, headaches-new, hearing problems, heart murmur, heart rhythm changes, itching, menstrual pain, muscle pains/cramps, night sweats, nosebleeds, pregnancy symptoms, shortness of breath, skin rash, sleeping problems, sore throat, swelling of feet/legs, swollen lymph glands, thirst - excessive ,  urination - excessive , urination changes/pain, urine leakage, vision changes, and voice change.         Pertinent positive and negative review of systems were noted in the above HPI. All other ROS was otherwise negative.   Vital Signs:  Patient profile:   76 year old female Height:      67 inches Weight:      148 pounds BMI:     23.26 BSA:     1.78 Pulse rate:   64 / minute Pulse rhythm:   regular BP sitting:   120 / 64  (left arm) Cuff size:   regular  Vitals Entered By: Ok Anis CMA (January 07, 2010 9:07 AM)  Physical Exam  General:  normal voice, no cough, alert and oriented Mouth:  exudate on the sides of her tongue Neck:  Supple; no masses or thyromegaly. Lungs:  Clear throughout to auscultation. Heart:  Regular rate and rhythm; no murmurs, rubs,  or bruits. Abdomen:  soft nontender abdomen with normoactive bowel sounds Rectal:  healed perianal fistula with small healed area 2 cm away from the anal opening at 7:00. No drainage of purulent material no tenderness first-grade hemorrhoids internally with the edema and hyperemia  stool is Hemoccult negative Extremities:  No clubbing, cyanosis, edema or deformities noted. Skin:  Intact without significant lesions or rashes. Psych:  Alert and cooperative. Normal mood and affect.   Impression & Recommendations:  Problem # 1:  ANAL FISTULA (ICD-565.1) Patient's anal fistula has healed after a course of Flagyl and sitz baths. She has active internal hemorrhoids. We will start her on Anusol-HC suppositories q.h.s.  Problem # 2:  CANDIDIASIS, ORAL (ICD-112.0) suspected recurrent candida esophagitis. We will start her on nystatin oral suspension 5 cc 3 times a day.  Problem # 3:  FAMILY HX COLON CANCER (ICD-V16.0)  last colonoscopy was in September 2009. Her next colonoscopy was September 2014. She has gastroesophageal reflux and is taking Prilosec 20 mg twice a day without much effect. We will switch her to pantoprazole 40 mg  twice a day by mail order and schedule  a barium esophagram to assess her motility.  Other Orders: Barium Swallow (Barium Swallow)  Patient Instructions: 1)  barium esophagram. 2)  Nystatin oral suspension 5 cc p.o. t.i.d. 3)  Anusol-HC suppositories q.h.s. 4)  Pantoprazole 40 mg p.o. b.i.d. in place of Prilosec. 5)  Copy sent to : Dr Jodelle Green 6)  The medication list was reviewed and reconciled.  All changed / newly prescribed medications were explained.  A complete medication list was provided to the patient / caregiver. Prescriptions: NYSTATIN 100000 UNIT/ML SUSP (NYSTATIN) Swish and swallow 5 cc by mouth three times a day  #8 ounces x 1   Entered by:   Hortense Ramal CMA (AAMA)   Authorized by:   Hart Carwin MD   Signed by:   Hortense Ramal CMA (AAMA) on 01/07/2010   Method used:   Electronically to        [facility] (retail)       [address]       [place], [place]  [address]       Ph: [phone]       Fax: [phone]   RxID:   [id number] ANUSOL-HC 25 MG SUPP (HYDROCORTISONE ACETATE) Insert 1 suppository into rectum at bedtime  #12 x 1   Entered by:   Hortense Ramal CMA (AAMA)   Authorized by:   Hart Carwin MD   Signed by:   Hortense Ramal CMA (AAMA) on 01/07/2010   Method used:   Electronically to        [facility] (retail)       [address]       [place], [place]  [address]       Ph: [phone]       Fax: [phone]   RxID:   [id number] PANTOPRAZOLE SODIUM 40 MG TBEC (PANTOPRAZOLE SODIUM) Take 1 tablet by mouth two times a day (please d/c prescription for omeprazole)  #180 x 3   Entered by:   Hortense Ramal CMA (AAMA)   Authorized by:   Hart Carwin MD   Signed by:   Hortense Ramal CMA (AAMA) on 01/07/2010   Method used:   Faxed to ...       [facility] (mail-order)       [address]       [place], [place]  [id number]       Ph: [phone]       Fax: [phone]   RxID:    [id number]

## 2010-12-15 NOTE — Progress Notes (Signed)
Summary: tramadol rx - FYI for SN  Phone Note Call from Patient Call back at Home Phone [phone]   Caller: Patient Call For: nadel Reason for Call: Talk to Nurse Summary of Call: Patient requesting refill--tramadol 50mg --[facility] Initial call taken by: Lehman Prom,  June 04, 2010 12:51 PM  Follow-up for Phone Call        last appt w/ SN 12/2009, upcoming appt 06/2010.  last refills sent 11/2009 #90 with 3 refills.  tr[facility]adol [facility].  LMOM TCB to inform pt of pending rx. Boone Master CNA/MA  June 04, 2010 3:14 PM   called spoke with patient's husband.  informed him of pending rx [facility].  pt's husband verbalized his understanding.  husband did want to inform SN that pt had emergency AAA and CABG last night.  per pt's husband, surgery went well but she "isn't out of the woods yet."  will sign off on msg and forward to Leigh as FYI for SN when he returns to the office. Follow-up by: Boone Master CNA/MA,  June 05, 2010 10:39 AM    Prescriptions: TRAMADOL HCL 50 MG TABS (TRAMADOL HCL) take one tablet by mouth three times a day as needed for pain  #90 x 3   Entered by:   Boone Master CNA/MA   Authorized by:   Michele Mcalpine MD   Signed by:   Boone Master CNA/MA on 06/04/2010   Method used:   Faxed to ...       [facility] (mail-order)       [address]       [place], [place]  [id number]       Ph: [phone]       Fax: [phone]   RxID:   [id number]

## 2010-12-15 NOTE — Letter (Signed)
Summary: [facility]  [facility]   Imported By: Sherian Rein 10/15/2010 12:23:12  _____________________________________________________________________  External Attachment:    Type:   Image     Comment:   External Document

## 2010-12-15 NOTE — Letter (Signed)
[facility]   Imported By: Marylou Mccoy 09/29/2010 11:23:24  _____________________________________________________________________  External Attachment:    Type:   Image     Comment:   External Document

## 2010-12-15 NOTE — Medication Information (Signed)
Summary: [facility]  Anticoagulant Therapy  Managed by: Bethena Midget, RN, BSN Referring MD: Tonny Bollman PCP: Alroy Dust, MD Supervising MD: Shirlee Latch MD, Dalton Indication 1: Atrial Fibrillation (ICD-427.31) Lab Used: [facility] Site: [address] PT 18.7 INR POC 1.9 INR RANGE 2 - 3  Dietary changes: no    Health status changes: no    Bleeding/hemorrhagic complications: no    Recent/future hospitalizations: no    Any changes in medication regimen? no    Recent/future dental: no  Any missed doses?: no       Is patient compliant with meds? yes       Allergies: 1)  ! Demerol 2)  ! Codeine 3)  ! Zocor 4)  ! Lipitor 5)  ! Requip (Ropinirole Hcl) 6)  ! Zetia (Ezetimibe) 7)  ! * Emycin  Anticoagulation Management History:      Her anticoagulation is being managed by telephone today.  Positive risk factors for bleeding include an age of 18 years or older.  The bleeding index is 'intermediate risk'.  Positive CHADS2 values include History of HTN.  Negative CHADS2 values include Age > 61 years old.  The start date was 05/13/1999.  Prothrombin time is 18.7.  Anticoagulation responsible provider: Shirlee Latch MD, Dalton.  INR POC: 1.9.    Anticoagulation Management Assessment/Plan:      The patient's current anticoagulation dose is Coumadin 5 mg tabs: take as directed by the [facility]....  The target INR is 2 - 3.  The next INR is due 07/06/2010.  Anticoagulation instructions were given to patient.  Results were reviewed/authorized by Bethena Midget, RN, BSN.  She was notified by Bethena Midget, RN, BSN.         Prior Anticoagulation Instructions: INR 1.8  Called spoke with pt's daughter Misty Stanley, advised per Charolotte Eke, Pharm D have pt start taking 5mg /2.5mg  alternating.  Recheck on Monday 06/29/10. Orders faxed to [facility].  Current Anticoagulation Instructions: INR 1.9 Change dose to 5mg s daily except 2.5mg s on TT&Sat. Recheck in one week. Orders sent to [facility]

## 2010-12-15 NOTE — Medication Information (Signed)
Summary: rov/sel  Anticoagulant Therapy  Managed by: Reina Fuse, PharmD Referring MD: Tonny Bollman PCP: Alroy Dust, MD Supervising MD: Eden Emms MD, Theron Arista Indication 1: Atrial Fibrillation (ICD-427.31) Lab Used: [facility] Site: [address] INR POC 2.4 INR RANGE 2 - 3  Dietary changes: no    Health status changes: no    Bleeding/hemorrhagic complications: no    Recent/future hospitalizations: no    Any changes in medication regimen? yes       Details: Increased Lasix dose the past 2 days due to increased leg edema.  Recent/future dental: no  Any missed doses?: no       Is patient compliant with meds? yes       Current Medications (verified): 1)  Fluticasone Propionate 50 Mcg/act Susp (Fluticasone Propionate) .Marland Kitchen.. 1-2 Sp in Each Nostril At Bedtime.Marland KitchenMarland Kitchen 2)  Coumadin 5 Mg Tabs (Warfarin Sodium) .... As Directed 3)  Nitrostat 0.4 Mg Subl (Nitroglycerin) .... Use As Needed Chest Pain 4)  Metoprolol Tartrate 50 Mg  Tabs (Metoprolol Tartrate) .... Take One Tablet By Mouth Two Times A Day 5)  Crestor 10 Mg Tabs (Rosuvastatin Calcium) .... Take One-Half  Tablet By Mouth Daily. 6)  Gabapentin 300 Mg Caps (Gabapentin) .... Take 1 Cap By Mouth Two Times A Day... 7)  Tramadol Hcl 50 Mg Tabs (Tramadol Hcl) .... Take One Tablet By Mouth Three Times A Day As Needed For Pain 8)  Zoloft 50 Mg Tabs (Sertraline Hcl) .... Take One Daily 9)  Tums 500 Mg Chew (Calcium Carbonate Antacid) .... One By Mouth As Needed 10)  Ocuvite  Tabs (Multiple Vitamins-Minerals) .... Take 1 Tablet By Mouth Two Times A Day 11)  Stool Softener 100 Mg Caps (Docusate Sodium) .... One Capsule By Mouth At Bedtime 12)  Benefiber  Powd (Wheat Dextrin) .... As Needed 13)  Anusol-Hc 25 Mg Supp (Hydrocortisone Acetate) .... Insert 1 Suppository Into Rectum At Bedtime 14)  Prilosec 20 Mg Cpdr (Omeprazole) .... Take 1 Capsule By Mouth Two Times A Day 15)  Vitamin D 1000 Unit  Tabs (Cholecalciferol) .... Take 1 Tablet By  Mouth Once A Day 16)  Glucosamine-Chondroitin  Caps (Glucosamine-Chondroit-Vit C-Mn) .... Take 1 Capsule By Mouth Two Times A Day 17)  Amiodarone Hcl 200 Mg Tabs (Amiodarone Hcl) .... Take One Tablet By Mouth Once A Day 18)  Furosemide 80 Mg Tabs (Furosemide) .Marland Kitchen.. 1 Tab By Mouth Once Daily 19)  Ambien 10 Mg Tabs (Zolpidem Tartrate) .... 1/2 At Bedtime As Needed 20)  Potassium Chloride Crys Cr 20 Meq Cr-Tabs (Potassium Chloride Crys Cr) .... Take One Tablet By Mouth Everytime That You Take Furosemide 21)  Zithromax Z-Pak 250 Mg Tabs (Azithromycin) .... Take As Directed 22)  Aspirin 81 Mg Tbec (Aspirin) .... Take One Tablet By Mouth Daily 23)  Clorazepate Dipotassium 7.5 Mg Tabs (Clorazepate Dipotassium) .... Take 1 Tab By Mouth Every 8 Hours As Needed For Nerves.  Allergies (verified): 1)  ! Demerol 2)  ! Codeine 3)  ! Zocor 4)  ! Lipitor 5)  ! Requip (Ropinirole Hcl) 6)  ! Zetia (Ezetimibe) 7)  ! * Emycin 8)  ! Morphine  Anticoagulation Management History:      The patient is taking warfarin and comes in today for a routine follow up visit.  Positive risk factors for bleeding include an age of 27 years or older.  The bleeding index is 'intermediate risk'.  Positive CHADS2 values include History of CHF and History of HTN.  Negative CHADS2 values include  Age > 35 years old.  The start date was 05/13/1999.  Anticoagulation responsible provider: Eden Emms MD, Theron Arista.  INR POC: 2.4.  Cuvette Lot#: [id number].  Exp: 09/2011.    Anticoagulation Management Assessment/Plan:      The patient's current anticoagulation dose is Coumadin 5 mg tabs: as directed.  The target INR is 2 - 3.  The next INR is due 09/24/2010.  Anticoagulation instructions were given to patient.  Results were reviewed/authorized by Reina Fuse, PharmD.  She was notified by Reina Fuse PharmD.         Prior Anticoagulation Instructions: INR 1.9  Take 1 tablet today, then continue taking 1/2 tablet everyday. Recheck in 2  weeks.  Current Anticoagulation Instructions: INR 2.4  Continue taking Coumadin 2.5 mg (0.5 tab) every day. Return to clinic in 3 weeks.

## 2010-12-15 NOTE — Progress Notes (Signed)
Summary: medication questions--Clorazepate  rx  Phone Note Call from Patient Call back at Home Phone [phone]   Caller: Patient Call For: nadel Summary of Call: Has questions about medication tranxene. Initial call taken by: Darletta Moll,  August 04, 2010 10:10 AM  Follow-up for Phone Call        called and spoke with pt.   Pt states SN prescribed Clorazepate 7.5mg  "a long time ago."  Pt states she gets "jittery and nervous" in the afternoons and wanted to know if SN would prescribe this med for her to take once a day in the afternoons to help with her nerves.  pt states she still takes the Zoloft for her depression and Ambien to help her sleep at night.  Will forward message to SN to address. Arman Filter LPN  August 04, 2010 10:35 AM  Allergies:  1)  ! Demerol 2)  ! Codeine 3)  ! Zocor 4)  ! Lipitor 5)  ! Requip (Ropinirole Hcl) 6)  ! Zetia (Ezetimibe) 7)  ! * Emycin 8)  ! Morphine  Additional Follow-up for Phone Call Additional follow up Details #1::        per SN---ok for her to have the clorazepate 7.5mg   #50  1 by mouth every 8 hours as needed for nerves refill x 2.  thanks Randell Loop CMA  August 04, 2010 11:55 AM     Additional Follow-up for Phone Call Additional follow up Details #2::    called and spoke with pt.  pt aware rx sent to pharmacy.  Aundra Millet Reynolds LPN  August 04, 2010 12:04 PM   New/Updated Medications: CLORAZEPATE DIPOTASSIUM 7.5 MG TABS (CLORAZEPATE DIPOTASSIUM) take 1 tab by mouth every 8 hours as needed for nerves. Prescriptions: CLORAZEPATE DIPOTASSIUM 7.5 MG TABS (CLORAZEPATE DIPOTASSIUM) take 1 tab by mouth every 8 hours as needed for nerves.  #50 x 2   Entered by:   Arman Filter LPN   Authorized by:   Michele Mcalpine MD   Signed by:   Arman Filter LPN on 09/81/1914   Method used:   [facility]phoned to ...       [facility]       [address]       [place], [place]  [address]  Ph: [phone]       Fax: [phone]   RxID:   [id number]

## 2010-12-15 NOTE — Assessment & Plan Note (Signed)
Summary: Anal drainage, ulcer rectal area   History of Present Illness Visit Type: Follow-up Visit Primary GI MD: Lina Sar MD Primary Provider: Lalla Brothers Chief Complaint: Rectal ulcer with drainage History of Present Illness:   76 YO FEMALE KNOWN TO DR BRODIE WHO HAD A NORMAL COLONOSCOPY IN 9/09. SHE COMES IN TODAY WITH C/O A SMALL HARD KNOT ON OUTSIDE OF RECTUM WHICH SHE NOTICED ABOUT  3 WEEKS AGO,NOT PAINFUL. SHE HAS A RECTOCELE WHICH WAS REPAIRED IN 1987 ,THEN RECUURED AND HAS TO USE MANUAL MANEUVERS TO EVACUATE RECTUM. YESTERDAY SHE NOTICED BLOOD ON THE TISSUE FROM THIS" KNOT", THEN LAST NIGHT HAD WHITISH MUCOUS IN A STREAK FROM THIS ARE. NO FEVER,CHILLS ETC. NO ABDOMINAL PAIN,BM'S NORMAL FOR HER,USES STOOL SOFTENERS   GI Review of Systems      Denies abdominal pain, acid reflux, belching, bloating, chest pain, dysphagia with liquids, dysphagia with solids, heartburn, loss of appetite, nausea, vomiting, vomiting blood, weight loss, and  weight gain.      Reports rectal bleeding.     Denies anal fissure, black tarry stools, change in bowel habit, constipation, diarrhea, diverticulosis, fecal incontinence, heme positive stool, hemorrhoids, irritable bowel syndrome, jaundice, light color stool, liver problems, and  rectal pain.    Current Medications (verified): 1)  Fluticasone Propionate 50 Mcg/act Susp (Fluticasone Propionate) .Marland Kitchen.. 1-2 Sp in Each Nostril At Bedtime.Marland KitchenMarland Kitchen 2)  Coumadin 5 Mg Tabs (Warfarin Sodium) .... Take As Directed By [facility].Marland KitchenMarland Kitchen 3)  Digoxin 0.25 Mg  Tabs (Digoxin) .... Take 1 Tablet By Mouth Once A Day 4)  Metoprolol Tartrate 50 Mg  Tabs (Metoprolol Tartrate) .... Take One Tablet By Mouth Two Times A Day 5)  Quinapril Hcl 20 Mg  Tabs (Quinapril Hcl) .... Take 1 Tablet By Mouth Once A Day 6)  Triamterene-Hctz 37.5-25 Mg  Tabs (Triamterene-Hctz) .... Take 1 Tablet By Mouth Once A Day 7)  Klor-Con M20 20 Meq  Tbcr (Potassium Chloride Crys Cr) .... Take 1  Tablet By Mouth Once A Day 8)  Crestor 5 Mg  Tabs (Rosuvastatin Calcium) .... Take 1/2 Tablet By Mouth Once A Day 9)  Fish Oil Concentrate 1000 Mg  Caps (Omega-3 Fatty Acids) .... Two Times A Day 10)  Prilosec 20 Mg Cpdr (Omeprazole) .... Take 1 Tab By Mouth Two Times A Day 30 Min Before Meals... 11)  Gabapentin 300 Mg Caps (Gabapentin) .... Take 1 Cap By Mouth Two Times A Day... 12)  Tramadol Hcl 50 Mg Tabs (Tramadol Hcl) .... Take One Tablet By Mouth Three Times A Day As Needed For Pain 13)  Ambien 10 Mg Tabs (Zolpidem Tartrate) .... Take 1/2 To 1  Tablet By Mouth At Bedtime 14)  Ocuvite  Tabs (Multiple Vitamins-Minerals) .... Take 1 Tablet By Mouth Two Times A Day 15)  Tums 500 Mg Chew (Calcium Carbonate Antacid) .... One By Mouth As Needed 16)  Nitrostat 0.4 Mg Subl (Nitroglycerin) .... Use As Needed Chest Pain 17)  Zoloft 50 Mg Tabs (Sertraline Hcl) .... Take One Daily  Allergies (verified): 1)  ! Demerol 2)  ! Codeine 3)  ! Zocor 4)  ! Lipitor 5)  ! Requip (Ropinirole Hcl) 6)  ! Zetia (Ezetimibe)  Past History:  Past Medical History: Reviewed history from 07/19/2009 and no changes required. Current Problems:  PAROXYSMAL ATRIAL FIBRILLATION (ICD-427.31) TRANSIENT ISCHEMIC ATTACKS, HX OF (ICD-V12.50) HYPERTENSION (ICD-401.9) HYPERCHOLESTEROLEMIA (ICD-272.0) VENOUS INSUFFICIENCY (ICD-459.81) PERIPHERAL NEUROPATHY (ICD-356.9) ACUTE MAXILLARY SINUSITIS (ICD-461.0) OBSTRUCTIVE SLEEP APNEA (ICD-327.23) Hx of ATRIAL SEPTAL DEFECT (ICD-745.5)  DIABETES MELLITUS, BORDERLINE (ICD-790.29) GERD (ICD-530.81) DYSPHAGIA (ICD-787.29) Hx of ESOPHAGEAL STRICTURE (ICD-530.3) IRRITABLE BOWEL SYNDROME, HX OF (ICD-V12.79) Family Hx of COLON CANCER (ICD-153.9) DEGENERATIVE JOINT DISEASE (ICD-715.90) BACK PAIN, LUMBAR (ICD-724.2) FIBROMYALGIA (ICD-729.1) ANXIETY DEPRESSION (ICD-300.4)  Past Surgical History: Reviewed history from 07/19/2009 and no changes required. S/P secundum ASD repair  at [facility] in 1982 S/P Vag Hysterectomy in 1987 w/ cystocele and rectocele repair, & ovarian surg in 1993 S/P Rt rotator cuff surgery in 2007 DC Cardioversion x 3 Tubal Ligation Appendectomy  Family History: Reviewed history from 07/19/2009 and no changes required. mother died age 61 --hx of als and dm father died age 79 from car accident 6 siblings 1 sister died age 88  hx of melanoma lungs/clot post-op 1 sister died age 27 pancreatic cancer 1 brother died age 79 from heart attack 1 brother died from heart problems and lung cancer 1 brother died from heart problems 1 brother alive age 64--hx of heart problems and new dx lung cancer 06-2008  Social History: Reviewed history from 07/19/2009 and no changes required. 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  Alcohol Use - no Illicit Drug Use - no Daily Caffeine Use Patient gets regular exercise.  Review of Systems       The patient complains of allergy/sinus, cough, headaches-new, nosebleeds, sore throat, and voice change.  The patient denies anemia, anxiety-new, arthritis/joint pain, back pain, blood in urine, breast changes/lumps, change in vision, confusion, coughing up blood, depression-new, fainting, fatigue, fever, hearing problems, heart murmur, heart rhythm changes, itching, menstrual pain, muscle pains/cramps, night sweats, pregnancy symptoms, shortness of breath, skin rash, sleeping problems, swelling of feet/legs, swollen lymph glands, thirst - excessive , urination - excessive , urination changes/pain, urine leakage, and vision changes.         ROS OTHERWISE AS IN HPI  Vital Signs:  Patient profile:   76 year old female Height:      67 inches Weight:      153.38 pounds BMI:     24.11 Pulse rate:   60 / minute Pulse rhythm:   regular BP sitting:   126 / 78  (left arm) Cuff size:   regular  Vitals Entered By: June McMurray CMA Duncan Dull) (November 28, 2009 11:11 AM)  Physical Exam  General:  Well  developed, well nourished, no acute distress. Head:  Normocephalic and atraumatic. Eyes:  PERRLA, no icterus. Lungs:  Clear throughout to auscultation. Heart:  Regular rate and rhythm; no murmurs, rubs,  or bruits.,SOFT MURMUR Abdomen:  SOFT, NONTENDER, NO MASS OR HSM,BS+ Rectal:  STOOL HEME NEGATIVE, NONTENDER EXAM, NO INTERNAL LESION FELT   - SMALL FISTULA AT 2 OCLOCK POSITION,PERIANAL AREA,NO SURROUNDING CELLULITIS,INDURATION ,NO DRAINAGE CUURENTLY. Neurologic:  Alert and  oriented x4;  grossly normal neurologically. Psych:  Alert and cooperative. Normal mood and affect.   Impression & Recommendations:  Problem # 1:  RECTAL BLEEDING (ICD-569.3) Assessment New 76 YO FEMALE WITH SMALL PERIANAL FISTULA.  START SITZ BATHS TWICE DAILY FLAGYL 250 3 X DAILY X 10 DAYS- PT IS ON COUMADIN,SHE WILL NOTIFY COUMADIN CLINIC OF ANTIBIOTIC USE, AND PLAN TO HAVE INR CHECKED IN 5-6 DAYS BALNEOL LOTION AFTER BM'S  FOLLOW UP IN OFFICE IN 2 WEEKS, WITH MYSELF OR DR Santiago Glad SHE DOES NOT HEAL WILL NEED SURGICAL EVALUATION.  Problem # 2:  FAMILY HX COLON CANCER (ICD-V16.0) Assessment: Comment Only PT UP TO DATE, NEGATIVE COLONOSCOPY 9/09  Problem # 3:  IRRITABLE BOWEL SYNDROME (ICD-564.1) Assessment: Comment Only  Problem # 4:  PAROXYSMAL ATRIAL FIBRILLATION (ICD-427.31) Assessment: Comment Only  Problem # 5:  Hx of ATRIAL SEPTAL DEFECT (ICD-745.5) Assessment: Comment Only  Patient Instructions: 1)  We sent a perscription for Flagyl to [facility]. 2)  Do Sitz baths twice daily x 14 days.  3)  We have given you samples of Balneol lotion to use rectally for cleansing. 4)  Pam will call you with an appointment to see Amy once her schedule is in the system.  Will try to make appt for week of 12-08-09.  5)  Copy sent to : Alroy Dust, MD Prescriptions: FLAGYL 250 MG TABS (METRONIDAZOLE) Take 1 tab 3 times daily x 10 days  #30 x 0   Entered by:   Lowry Ram NCMA   Authorized by:   Sammuel Cooper PA-c   Signed by:   Lowry Ram NCMA on 11/28/2009   Method used:   Electronically to        [facility] (retail)       [address]       [place], [place]  [address]       Ph: [phone]       Fax: [phone]   RxID:   [id number]

## 2010-12-15 NOTE — Medication Information (Signed)
Summary: rov/tm  Anticoagulant Therapy  Managed by: Bethena Midget, RN, BSN Referring MD: Tonny Bollman PCP: Alroy Dust, MD Supervising MD: Clifton James MD, Cristal Deer Indication 1: Atrial Fibrillation (ICD-427.31) Lab Used: [facility] Site: [facility] INR POC 2.3 INR RANGE 2 - 3  Dietary changes: no    Health status changes: yes       Details: Having UGI issues- Pending Barrium Sonogram on 01/16/10.   Bleeding/hemorrhagic complications: no    Recent/future hospitalizations: no    Any changes in medication regimen? yes       Details: Nystatin susp. swish and swallow started last Wednesday TID.   Recent/future dental: no  Any missed doses?: no       Is patient compliant with meds? yes      Comments: Seeing Dr. Excell Seltzer today.   Allergies: 1)  ! Demerol 2)  ! Codeine 3)  ! Zocor 4)  ! Lipitor 5)  ! Requip (Ropinirole Hcl) 6)  ! Zetia (Ezetimibe) 7)  ! * Emycin  Anticoagulation Management History:      The patient is taking warfarin and comes in today for a routine follow up visit.  Positive risk factors for bleeding include an age of 19 years or older.  The bleeding index is 'intermediate risk'.  Positive CHADS2 values include History of HTN.  Negative CHADS2 values include Age > 3 years old.  The start date was 05/13/1999.  Anticoagulation responsible provider: Clifton James MD, Cristal Deer.  INR POC: 2.3.  Cuvette Lot#: [id number].  Exp: 02/2011.    Anticoagulation Management Assessment/Plan:      The patient's current anticoagulation dose is Coumadin 5 mg tabs: take as directed by the [facility]....  The target INR is 2 - 3.  The next INR is due 01/28/2010.  Anticoagulation instructions were given to patient.  Results were reviewed/authorized by Bethena Midget, RN, BSN.  She was notified by Bethena Midget, RN, BSN.         Prior Anticoagulation Instructions: INR 1.9  Take 1.5  tabs today, then 1.5 tabs each Monday, Wednesday and Friday and 1 tab on all other days.   Recheck  in 2 - 3 weeks.    Current Anticoagulation Instructions: INR 2.3 continue 5mg s daily except 7.5mg s on Mondays, Wednesdays and Fridays. Recheck in 2 weeks Prescriptions: COUMADIN 5 MG TABS (WARFARIN SODIUM) take as directed by the [facility]...  #120 x 3   Entered by:   Bethena Midget, RN, BSN   Authorized by:   Norva Karvonen, MD   Signed by:   Bethena Midget, RN, BSN on 01/13/2010   Method used:   Faxed to ...       [facility] (mail-order)       [address]       [place], [place]  [id number]       Ph: [phone]       Fax: [phone]   RxID:   [id number] COUMADIN 5 MG TABS (WARFARIN SODIUM) take as directed by the [facility]...  #120 x 3   Entered by:   Bethena Midget, RN, BSN   Authorized by:   Norva Karvonen, MD   Signed by:   Bethena Midget, RN, BSN on 01/13/2010   Method used:   Faxed to ...       [facility] (mail-order)       [address]       [place], [place]  [id number]       Ph:  [phone]       Fax: [phone]   RxID:   [id number]

## 2010-12-15 NOTE — Progress Notes (Signed)
Summary: Follow up appoinment  Phone Note Call from Patient Call back at [phone] cell   Call For: [name], PA/Dr Juanda Chance Reason for Call: Talk to Nurse Summary of Call: When she was last here on friday was told she would get a follow up appoinment in February with D Brodie but the appoinment is not in the computer.  Initial call taken by: Leanor Kail [facility],  December 01, 2009 10:00 AM  Follow-up for Phone Call        AMY--When and with whom do you want this lady to f/u? Follow-up by: Laureen Ochs LPN,  December 01, 2009 3:12 PM     Appended Document: Follow up appoinment SHE CAN FOLLOW UP WITH ME IN 2 WEEKS,PAM WAS FOING TO CALL HER WITH APPT ,AS MY SCHEDULE IS NOT OUT THAT FAR  Appended Document: Follow up appoinment Pt did like Amy and her anal problem is better but has reflux problem lately and wants to see Dr. Juanda Chance.  Couldn't get her appt until 01-07-10.  Flagged myself several times to try to get her sooner appt.  Appended Document: Follow up appoinment Made appt per pt's request to see Dr. Juanda Chance on 01-07-10.  Pt pleased with Amy and her anal problem seems better.

## 2010-12-15 NOTE — Progress Notes (Signed)
Summary: feet swelling  Phone Note Call from Patient   Caller: [phone] or [phone] patient Reason for Call: Talk to Nurse Summary of Call: pt's feet are swelling Initial call taken by: Glynda Jaeger,  August 31, 2010 2:47 PM  Follow-up for Phone Call        I spoke with the pt and her weight at home is 150 lbs.  At the pt's last appt her weight was 142 lbs.  The pt does have bilateral lower extremity swelling.  Right (graft site) leg worse than left leg. The pt denies SOB but feels like she may be compensating by walking slower.  The pt denies SOB at night or using more pillows.  The pt sleeps on a wedge pillow at night due to reflux.  The pt has not picked up compression stockings.  I will send this message to Dr Excell Seltzer for review.    Follow-up by: Julieta Gutting, RN, BSN,  August 31, 2010 4:36 PM  Additional Follow-up for Phone Call Additional follow up Details #1::        Per Dr Excell Seltzer please have the pt double up on her potassium and furosemide for 4 days. The pt will take Furosemide 80mg  bid and Potassium bid for 4 days.  Then resume normal dose.  Please call if symptoms do not improve. Pt aware.  The pt also will start wearing compression stockings.  Additional Follow-up by: Julieta Gutting, RN, BSN,  September 01, 2010 3:17 PM

## 2010-12-15 NOTE — Assessment & Plan Note (Signed)
Summary: f2w   Visit Type:  Follow-up Referring Provider:  n/a Primary Provider:  Alroy Dust, MD  CC:  Legs edema and Sob.  History of Present Illness: 76 year-old woman presenting for follow-up evaluation.  She underwent emergency surgery for Type A aortic dissection earlier this year. She underwent aortic root repair and CABG of the LAD and LCx. She has a remote history of surgical repair of ASD 1982 and paroxysmal atrial fibrillation treated with long-term warfarin.  She has been on amiodarone since cardiac surgery in July.  The patient continues to complain of edema and shortness of breath with exertion.  Swelling is a little better since increasing lasix a few weeks back. She reports 'good days and bad days.' She has been compliant with medication and a low-salt diet. No chest pain, orthopnea, or PND.  Current Medications (verified): 1)  Fluticasone Propionate 50 Mcg/act Susp (Fluticasone Propionate) .Marland Kitchen.. 1-2 Sp in Each Nostril At Bedtime.Marland KitchenMarland Kitchen 2)  Coumadin 5 Mg Tabs (Warfarin Sodium) .... As Directed 3)  Metoprolol Tartrate 50 Mg  Tabs (Metoprolol Tartrate) .... Take One Tablet By Mouth Two Times A Day 4)  Crestor 10 Mg Tabs (Rosuvastatin Calcium) .... Take One-Half  Tablet By Mouth Daily. 5)  Gabapentin 300 Mg Caps (Gabapentin) .... Take 1 Cap By Mouth Two Times A Day... 6)  Tramadol Hcl 50 Mg Tabs (Tramadol Hcl) .... Take One Tablet By Mouth Three Times A Day As Needed For Pain 7)  Zoloft 50 Mg Tabs (Sertraline Hcl) .... Take One Daily 8)  Tums 500 Mg Chew (Calcium Carbonate Antacid) .... One By Mouth As Needed 9)  Ocuvite  Tabs (Multiple Vitamins-Minerals) .... Take 1 Tablet By Mouth Two Times A Day 10)  Stool Softener 100 Mg Caps (Docusate Sodium) .... One Capsule By Mouth At Bedtime 11)  Benefiber  Powd (Wheat Dextrin) .... As Needed 12)  Anusol-Hc 25 Mg Supp (Hydrocortisone Acetate) .... Insert 1 Suppository Into Rectum At Bedtime 13)  Prilosec 20 Mg Cpdr (Omeprazole) .... Take 1  Capsule By Mouth Two Times A Day 14)  Vitamin D 1000 Unit  Tabs (Cholecalciferol) .... Take 1 Tablet By Mouth Once A Day 15)  Glucosamine-Chondroitin  Caps (Glucosamine-Chondroit-Vit C-Mn) .... Take 1 Capsule By Mouth Two Times A Day 16)  Amiodarone Hcl 200 Mg Tabs (Amiodarone Hcl) .... Take One Tablet By Mouth Once A Day 17)  Furosemide 80 Mg Tabs (Furosemide) .... Take One Tablet By Mouth Two Times A Day 18)  Ambien 10 Mg Tabs (Zolpidem Tartrate) .Marland Kitchen.. 1  At Bedtime As Needed 19)  Potassium Chloride Crys Cr 20 Meq Cr-Tabs (Potassium Chloride Crys Cr) .... Take Two Tablets By Mouth Once A Day 20)  Aspirin 81 Mg Tbec (Aspirin) .... Take One Tablet By Mouth Daily  Allergies: 1)  ! Demerol 2)  ! Codeine 3)  ! Zocor 4)  ! Lipitor 5)  ! Requip (Ropinirole Hcl) 6)  ! Zetia (Ezetimibe) 7)  ! * Emycin 8)  ! Morphine  Past History:  Past medical history reviewed for relevance to current acute and chronic problems.  Past Medical History: Reviewed history from 09/04/2010 and no changes required. 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.01)   Type A aortic dissection 2011 - emergency surgery Hx of ATRIAL SEPTAL DEFECT (ICD-745.5) VENOUS INSUFFICIENCY (ICD-459.81) EDEMA (ICD-782.3) HYPERCHOLESTEROLEMIA (ICD-272.0) DIABETES MELLITUS, BORDERLINE (ICD-790.29) GERD (ICD-530.81) Hx of  DYSPHAGIA (ICD-787.29) Hx of ESOPHAGEAL STRICTURE (ICD-530.3) IRRITABLE BOWEL SYNDROME (ICD-564.1) PERSONAL HX COLONIC POLYPS (ICD-V12.72) Family Hx of COLON CANCER (ICD-153.9) TRANSIENT ISCHEMIC ATTACKS, HX OF (ICD-V12.50) PERIPHERAL NEUROPATHY (ICD-356.9) DEGENERATIVE JOINT DISEASE (ICD-715.90) BACK PAIN, LUMBAR (ICD-724.2) FIBROMYALGIA (ICD-729.1) ANXIETY DEPRESSION (ICD-300.4)  Review of  Systems       Negative except as per HPI   Vital Signs:  Patient profile:   76 year old female Height:      67 inches Weight:      145.50 pounds BMI:     22.87 Pulse rate:   55 / minute Pulse rhythm:   regular Resp:     18 per minute BP sitting:   116 / 70  (left arm) Cuff size:   regular  Vitals Entered By: Vikki Ports (October 05, 2010 11:34 AM)  Physical Exam  General:  Pt is alert and oriented, in no acute distress. HEENT: normal Neck: normal carotid upstrokes without bruits, prominent HJR Lungs: CTA CV: RRR with 2/6 systolic murmur at the LSB Abd: soft, NT, positive BS, no bruit, liver pulsations present Ext:1+ pretibial edema bilaterally. peripheral pulses 2+ and equal Skin: warm and dry without rash    EKG  Procedure date:  10/05/2010  Findings:      NSR with prolonged QT, nonspecific TWave abnormality, HR 55 bpm, QTc 486 ms  Impression & Recommendations:  Problem # 1:  ACUTE ON CHRONIC DIASTOLIC HEART FAILURE (ICD-428.33) Remains volume overloaded, suspect primarily right-sided heart failure secondary to severe TR and RV dysfunction. Add metolazone 2.5 mg once weekly and continue furosemide 80 mg two times a day. She continues with daily weights and will adjust diuretics accordingly for weight gain. I advised to take an extra 20 meq of KCL on the day when she takes metolazone.   The following medications were removed from the medication list:    Amiodarone Hcl 200 Mg Tabs (Amiodarone hcl) .Marland Kitchen... Take one tablet by mouth once a day Her updated medication list for this problem includes:    Coumadin 5 Mg Tabs (Warfarin sodium) .Marland Kitchen... As directed    Metoprolol Tartrate 50 Mg Tabs (Metoprolol tartrate) .Marland Kitchen... Take one tablet by mouth two times a day    Furosemide 80 Mg Tabs (Furosemide) .Marland Kitchen... Take one tablet by mouth two times a day    Aspirin 81 Mg Tbec (Aspirin) .Marland Kitchen... Take one tablet by mouth daily    Metolazone 2.5 Mg Tabs (Metolazone) .Marland Kitchen... Take one tablet by  mouth 30 minutes prior to furosemide dose every wednesday morning  Orders: EKG w/ Interpretation ([address])  Problem # 2:  ATRIAL FIBRILLATION (ICD-427.31) Maintaining sinus rhythm and no palpitations (she has been symptomatic with atrial fib in the past). She is complaining of worsening gait unsteadiness and I'm concerned about amiodarone as a potential cause of these symptoms. Recommend d/c amiodarone at this time and continue on metoprolol and coumadin.   The following medications were removed from the medication list:    Amiodarone Hcl 200 Mg Tabs (Amiodarone hcl) .Marland Kitchen... Take one tablet by mouth once a day Her updated medication list for this problem includes:    Coumadin 5 Mg Tabs (Warfarin sodium) .Marland Kitchen... As directed    Metoprolol Tartrate 50 Mg Tabs (Metoprolol tartrate) .Marland Kitchen... Take one tablet by mouth two times a day    Aspirin 81 Mg Tbec (Aspirin) .Marland Kitchen... Take one tablet by mouth daily  Orders: EKG w/ Interpretation ([address])  Problem # 3:  CORONARY ARTERY DISEASE (ICD-414.00) s/p CABG - continue low-dose  aspirin.  Her updated medication list for this problem includes:    Coumadin 5 Mg Tabs (Warfarin sodium) .Marland Kitchen... As directed    Metoprolol Tartrate 50 Mg Tabs (Metoprolol tartrate) .Marland Kitchen... Take one tablet by mouth two times a day    Aspirin 81 Mg Tbec (Aspirin) .Marland Kitchen... Take one tablet by mouth daily  Orders: EKG w/ Interpretation ([address])  Patient Instructions: 1)  Your physician recommends that you schedule a follow-up appointment in: 4 WEEKS 2)  Your physician recommends that you return for lab work in: 4 WEEKS (BMP and BNP 428.33, 427.31) 3)  Your physician has recommended you make the following change in your medication: STOP Amiodarone, START Metolazone 2.5mg  take one tablet 30 minutes prior to your Furosemide dose every WEDNESDAY Morning,  Please take one additional Potassium tablet every [facility] Prescriptions: METOLAZONE 2.5 MG TABS (METOLAZONE) take one tablet by mouth 30 minutes  prior to Furosemide dose every Wednesday morning  #30 x 1   Entered by:   Julieta Gutting, RN, BSN   Authorized by:   Norva Karvonen, MD   Signed by:   Julieta Gutting, RN, BSN on 10/05/2010   Method used:   Electronically to        [facility] (retail)       [address]       [place], [place]  [address]       Ph: [phone]       Fax: [phone]   RxID:   [id number] POTASSIUM CHLORIDE CRYS CR 20 MEQ CR-TABS (POTASSIUM CHLORIDE CRYS CR) take two tablets by mouth once a day except on Wednesday take three tablets daily  #60 x 3   Entered by:   Julieta Gutting, RN, BSN   Authorized by:   Norva Karvonen, MD   Signed by:   Julieta Gutting, RN, BSN on 10/05/2010   Method used:   Electronically to        [facility] (retail)       [address]       [place], [place]  [address]       Ph: [phone]       Fax: [phone]   RxID:   [id number]

## 2010-12-15 NOTE — Progress Notes (Signed)
Summary: spitting up blood  Phone Note Call from Patient   Caller: Patient Call For: nadel Summary of Call: pt spitting up blood . concern because she is on coumadin. Initial call taken by: Rickard Patience,  July 10, 2010 8:16 AM  Follow-up for Phone Call        Spoke with pt.  Pt states she has been "spitting up blood" x 2 days.  Amount is about the "size of a penny" and is bright red.  She states she is also coughing up light creamy brown mucus and denies f/c/s.  Is on coumadin -- [facility].  INR 4.05 yesterday.  States she was told to hold coumadin last night.  Pt states she has been through a lot over the past several weeks and does not want to come in but will if needed.  Dr. Kriste Basque, pls advise.  Thanks! Follow-up by: Gweneth Dimitri RN,  July 10, 2010 9:09 AM  Additional Follow-up for Phone Call Additional follow up Details #1::        called and spoke with pt  and she is aware per SN---to take the Avelox 400mg   1 daily until gone  #7.  this has been sent to her pharmacy and pt is aware.  she stated that since her surgery she has been coughing up this caramel/brownish color sputum---she has been also coughing up a penny size blood everyday and she is on coumadin but followed by the coumadin clinic.  she will call them to see if she still needs to hold the coumadin today.  pt will call back for any questions or concerns. Randell Loop CMA  July 10, 2010 9:22 AM     New/Updated Medications: AVELOX 400 MG TABS (MOXIFLOXACIN HCL) take one tablet by mouth once daily Prescriptions: AVELOX 400 MG TABS (MOXIFLOXACIN HCL) take one tablet by mouth once daily  #7 x 0   Entered by:   Randell Loop CMA   Authorized by:   Michele Mcalpine MD   Signed by:   Randell Loop CMA on 07/10/2010   Method used:   Electronically to        [facility] (retail)       [address]       [place], [place]  [address]       Ph: [phone]       Fax:  [phone]   RxID:   [id number]

## 2010-12-15 NOTE — Medication Information (Signed)
Summary: rov/tm  Anticoagulant Therapy  Managed by: Bethena Midget, RN, BSN Referring MD: Tonny Bollman PCP: Alroy Dust, MD Supervising MD: Daleen Squibb MD, Maisie Fus Indication 1: Atrial Fibrillation (ICD-427.31) Lab Used: [facility] Site: [facility] INR POC 2.9 INR RANGE 2 - 3  Dietary changes: no    Health status changes: no    Bleeding/hemorrhagic complications: no    Recent/future hospitalizations: no    Any changes in medication regimen? yes       Details: still has few more days left of Nystain  Recent/future dental: no  Any missed doses?: no       Is patient compliant with meds? yes       Allergies: 1)  ! Demerol 2)  ! Codeine 3)  ! Zocor 4)  ! Lipitor 5)  ! Requip (Ropinirole Hcl) 6)  ! Zetia (Ezetimibe) 7)  ! * Emycin  Anticoagulation Management History:      The patient is taking warfarin and comes in today for a routine follow up visit.  Positive risk factors for bleeding include an age of 65 years or older.  The bleeding index is 'intermediate risk'.  Positive CHADS2 values include History of HTN.  Negative CHADS2 values include Age > 36 years old.  The start date was 05/13/1999.  Anticoagulation responsible provider: Daleen Squibb MD, Maisie Fus.  INR POC: 2.9.  Cuvette Lot#: [id number].  Exp: 03/2011.    Anticoagulation Management Assessment/Plan:      The patient's current anticoagulation dose is Coumadin 5 mg tabs: take as directed by the [facility]....  The target INR is 2 - 3.  The next INR is due 03/13/2010.  Anticoagulation instructions were given to patient.  Results were reviewed/authorized by Bethena Midget, RN, BSN.  She was notified by Bethena Midget, RN, BSN.         Prior Anticoagulation Instructions: INR 2.7 Continue 5mg s daily except 7.5mg s on Mondays, Wednesdays and Fridays. Recheck in 4 weeks.   Current Anticoagulation Instructions: INR 2.9 Change dose to 5mg s daily except to 7.5mg s on Mondays and Fridays. Recheck in 3 weeks.

## 2010-12-15 NOTE — Medication Information (Signed)
Summary: [facility]in Clinic  Anticoagulant Therapy  Managed by: Weston Brass, PharmD Referring MD: Tonny Bollman PCP: Alroy Dust, MD Supervising MD: Graciela Husbands MD, Viviann Spare Indication 1: Atrial Fibrillation (ICD-427.31) Lab Used: [facility] Site: [address] INR POC 4.05 INR RANGE 2 - 3  Dietary changes: no    Health status changes: no    Bleeding/hemorrhagic complications: yes       Details: coughed up some blood tinged mucus this morning.  HH RN saw pt today.  Pt stated it only occured this one time and has resolved.   Recent/future hospitalizations: no    Any changes in medication regimen? yes       Details: on amiodarone  Recent/future dental: no  Any missed doses?: no       Is patient compliant with meds? yes       Allergies: 1)  ! Demerol 2)  ! Codeine 3)  ! Zocor 4)  ! Lipitor 5)  ! Requip (Ropinirole Hcl) 6)  ! Zetia (Ezetimibe) 7)  ! * Emycin 8)  ! Morphine  Anticoagulation Management History:      Her anticoagulation is being managed by telephone today.  Positive risk factors for bleeding include an age of 75 years or older.  The bleeding index is 'intermediate risk'.  Positive CHADS2 values include History of HTN.  Negative CHADS2 values include Age > 40 years old.  The start date was 05/13/1999.  Anticoagulation responsible provider: Graciela Husbands MD, Viviann Spare.  INR POC: 4.05.  Exp: 07/2011.    Anticoagulation Management Assessment/Plan:      The patient's current anticoagulation dose is Coumadin 5 mg tabs: 5mg  every other day 2.5 every other day.  The target INR is 2 - 3.  The next INR is due 07/13/2010.  Anticoagulation instructions were given to patient.  Results were reviewed/authorized by Weston Brass, PharmD.  She was notified by Weston Brass PharmD.         Prior Anticoagulation Instructions: INR 3.1  Attempted to reach pt.  No answer. Weston Brass PharmD  July 06, 2010 3:36 PM  Called spoke with pt's son, pt at MD appt.  Advised son to tell pt Coumadin looked OK,  continue on same dosage and we will attempted to call back tomorrow with further instructions. Bettey Muraoka RN  July 06, 2010 5:00 PM   Spoke with pt.  She took 1/2 tablet yesterday. Continue same dose of 1 tablet every day except 1/2 tablet on Tuesday, Thursday and Saturday.  Recheck INR in 1 week.  Orders faxed to [facility]. Weston Brass PharmD  July 07, 2010 9:00 AM   Current Anticoagulation Instructions: INR 4.05  Spoke with pt.  Skip today's dose of Coumadin then decrease dose to 1/2 tablet every day except 1 tablet on Monday and Friday.  Recheck INR on 8/29.  Orders faxed to [facility].

## 2010-12-15 NOTE — Assessment & Plan Note (Signed)
Summary: ROV   Visit Type:  Follow-up Referring Provider:  n/a Primary Provider:  Alroy Dust, MD  CC:  pt complains of sob.  History of Present Illness: 76 year-old woman presenting for follow-up evaluation after recent emergency surgery for Type A aortic dissection. She underwent aortic root repair and CABG of the LAD and LCx. She has a remote history of surgical repair of ASD 1982 and paroxysmal atrial fibrillation treated with long-term warfarin. She has also had paroxysmal atrial fibrillation and has been treated with Amiodarone.   She remains dyspneic, but her breathing is improving. She complains of fatigue. No orthopnea or PND. No edema. Denies chest pain. Leg swelling has greatly improved since the time of her last visit. She is walking twice daily.  Current Medications (verified): 1)  Fluticasone Propionate 50 Mcg/act Susp (Fluticasone Propionate) .Marland Kitchen.. 1-2 Sp in Each Nostril At Bedtime.Marland KitchenMarland Kitchen 2)  Coumadin 5 Mg Tabs (Warfarin Sodium) .... As Directed 3)  Nitrostat 0.4 Mg Subl (Nitroglycerin) .... Use As Needed Chest Pain 4)  Metoprolol Tartrate 50 Mg  Tabs (Metoprolol Tartrate) .... Take One Tablet By Mouth Two Times A Day 5)  Crestor 10 Mg Tabs (Rosuvastatin Calcium) .... Take One-Half  Tablet By Mouth Daily. 6)  Gabapentin 300 Mg Caps (Gabapentin) .... Take 1 Cap By Mouth Two Times A Day... 7)  Tramadol Hcl 50 Mg Tabs (Tramadol Hcl) .... Take One Tablet By Mouth Three Times A Day As Needed For Pain 8)  Zoloft 50 Mg Tabs (Sertraline Hcl) .... Take One Daily 9)  Tums 500 Mg Chew (Calcium Carbonate Antacid) .... One By Mouth As Needed 10)  Ocuvite  Tabs (Multiple Vitamins-Minerals) .... Take 1 Tablet By Mouth Two Times A Day 11)  Stool Softener 100 Mg Caps (Docusate Sodium) .... One Capsule By Mouth At Bedtime 12)  Benefiber  Powd (Wheat Dextrin) .... As Needed 13)  Anusol-Hc 25 Mg Supp (Hydrocortisone Acetate) .... Insert 1 Suppository Into Rectum At Bedtime 14)  Prilosec 20 Mg Cpdr  (Omeprazole) .... Take 1 Capsule By Mouth Two Times A Day 15)  Vitamin D 1000 Unit  Tabs (Cholecalciferol) .... Take 1 Tablet By Mouth Once A Day 16)  Glucosamine-Chondroitin  Caps (Glucosamine-Chondroit-Vit C-Mn) .... Take 1 Capsule By Mouth Two Times A Day 17)  Amiodarone Hcl 200 Mg Tabs (Amiodarone Hcl) .... Take One Tablet By Mouth Once A Day 18)  Furosemide 80 Mg Tabs (Furosemide) .Marland Kitchen.. 1 Tab By Mouth Once Daily 19)  Ambien 10 Mg Tabs (Zolpidem Tartrate) .... 1/2 At Bedtime As Needed 20)  Potassium Chloride Crys Cr 20 Meq Cr-Tabs (Potassium Chloride Crys Cr) .... Take One Tablet By Mouth Everytime That You Take Furosemide  Allergies: 1)  ! Demerol 2)  ! Codeine 3)  ! Zocor 4)  ! Lipitor 5)  ! Requip (Ropinirole Hcl) 6)  ! Zetia (Ezetimibe) 7)  ! * Emycin 8)  ! Morphine  Past History:  Past medical history reviewed for relevance to current acute and chronic problems.  Past Medical History: Reviewed history from 07/02/2010 and no changes required. Type A aortic dissection 2011 - emergency surgery PAROXYSMAL ATRIAL FIBRILLATION (ICD-427.31) TRANSIENT ISCHEMIC ATTACKS, HX OF (ICD-V12.50) HYPERTENSION (ICD-401.9) HYPERCHOLESTEROLEMIA (ICD-272.0) VENOUS INSUFFICIENCY (ICD-459.81) PERIPHERAL NEUROPATHY (ICD-356.9) ACUTE MAXILLARY SINUSITIS (ICD-461.0) OBSTRUCTIVE SLEEP APNEA (ICD-327.23) Hx of ATRIAL SEPTAL DEFECT (ICD-745.5) DIABETES MELLITUS, BORDERLINE (ICD-790.29) GERD (ICD-530.81) DYSPHAGIA (ICD-787.29) Hx of ESOPHAGEAL STRICTURE (ICD-530.3) IRRITABLE BOWEL SYNDROME, HX OF (ICD-V12.79) Family Hx of COLON CANCER (ICD-153.9) DEGENERATIVE JOINT DISEASE (ICD-715.90) BACK PAIN, LUMBAR (  ICD-724.2) FIBROMYALGIA (ICD-729.1) ANXIETY DEPRESSION (ICD-300.4)  Review of Systems       Negative except as per HPI   Vital Signs:  Patient profile:   76 year old female Height:      67 inches Weight:      142 pounds BMI:     22.32 Pulse rate:   56 / minute Resp:     18 per  minute BP sitting:   116 / 71  (left arm)  Vitals Entered By: Kem Parkinson (August 03, 2010 10:36 AM)  Serial Vital Signs/Assessments:  Time      Position  BP       Pulse  Resp  Temp     By           R Arm     109/73                         Kimalexis Barnes           L Arm     116/71                         Kimalexis Barnes   Physical Exam  General:  Pt is alert and oriented, in no acute distress. HEENT: normal Neck: normal carotid upstrokes without bruits, visible V waves in neck veins Lungs: CTA CV: RRR with 2/6 systolic murmur at the LSB Abd: soft, NT, positive BS, no bruit, liver pulsations present Ext:no edema. peripheral pulses 2+ and equal Skin: warm and dry without rash    Echocardiogram  Procedure date:  07/14/2010  Findings:      Study Conclusions            - Left ventricle: The cavity size was normal. Wall thickness was       normal. Systolic function was normal. The estimated ejection       fraction was in the range of 55% to 60%. Wall motion was normal;       there were no regional wall motion abnormalities. Doppler       parameters are consistent with restrictive physiology, indicative       of decreased left ventricular diastolic compliance and/or       increased left atrial pressure.     - Mitral valve: Calcified annulus. Moderate regurgitation.     - Left atrium: The atrium was moderately to severely dilated.     - Right atrium: The atrium was moderately dilated.     - Tricuspid valve: Severe regurgitation.     - Pulmonary arteries: Systolic pressure was mildly increased.     - Pericardium, extracardiac: A trivial pericardial effusion was       identified. There was a left pleural effusion.  Impression & Recommendations:  Problem # 1:  ACUTE ON CHRONIC DIASTOLIC HEART FAILURE (ICD-428.33) The patient is clinically improved, but still with dyspnea on exertion, NYHA Class 2-3 symptoms. Her recent BNP remained elevated at 617 and echo shows  findings consistent with increased LA pressure as well as severe tricuspid regurgitation (exam also consistent with severe TR). She will continue with current medical therapy as she reports clinical improvement since increasing daily furosemide dose. Will f/u with labwork in about 6 weeks and reassess at that time.  The following medications were removed from the medication list:    Aspirin 81 Mg Tbec (Aspirin) .Marland Kitchen... Take one tablet by mouth daily Her updated medication list for this  problem includes:    Coumadin 5 Mg Tabs (Warfarin sodium) .Marland Kitchen... As directed    Nitrostat 0.4 Mg Subl (Nitroglycerin) ..... Use as needed chest pain    Metoprolol Tartrate 50 Mg Tabs (Metoprolol tartrate) .Marland Kitchen... Take one tablet by mouth two times a day    Amiodarone Hcl 200 Mg Tabs (Amiodarone hcl) .Marland Kitchen... Take one tablet by mouth once a day    Furosemide 80 Mg Tabs (Furosemide) .Marland Kitchen... 1 tab by mouth once daily    Aspirin 81 Mg Tbec (Aspirin) .Marland Kitchen... Take one tablet by mouth daily  Orders: T-2 View CXR (71020TC) TLB-CBC Platelet - w/Differential (85025-CBCD)  Problem # 2:  PAROXYSMAL ATRIAL FIBRILLATION (ICD-427.31) Maintaining sinus rhythm, no report of palpitations. She is on chronic anticoagulation. Repeat EKG at time of next OV.  The following medications were removed from the medication list:    Aspirin 81 Mg Tbec (Aspirin) .Marland Kitchen... Take one tablet by mouth daily Her updated medication list for this problem includes:    Coumadin 5 Mg Tabs (Warfarin sodium) .Marland Kitchen... As directed    Metoprolol Tartrate 50 Mg Tabs (Metoprolol tartrate) .Marland Kitchen... Take one tablet by mouth two times a day    Amiodarone Hcl 200 Mg Tabs (Amiodarone hcl) .Marland Kitchen... Take one tablet by mouth once a day    Aspirin 81 Mg Tbec (Aspirin) .Marland Kitchen... Take one tablet by mouth daily  Patient Instructions: 1)  Your physician recommends that you schedule a follow-up appointment in: 6 WEEKS 2)  Your physician recommends that you return for lab work in: 6 WEEKS (TSH,  LIVER, BMP 414.02, 427.31, 428.32) 3)  Your physician has recommended you make the following change in your medication: Take Zpak as directed 4)  Chest X-ray obtained today.  Prescriptions: ZITHROMAX Z-PAK 250 MG TABS (AZITHROMYCIN) take as directed  #1 x 0   Entered by:   Julieta Gutting, RN, BSN   Authorized by:   Norva Karvonen, MD   Signed by:   Julieta Gutting, RN, BSN on 08/03/2010   Method used:   Electronically to        [facility] (retail)       [address]       [place], [place]  [address]       Ph: [phone]       Fax: [phone]   RxID:   [id number]

## 2010-12-15 NOTE — Progress Notes (Signed)
Summary: medication  Phone Note Call from Patient   Caller: Patient Call For: nadel Summary of Call: need to discuss medications being transferred to mail order Initial call taken by: Rickard Patience,  November 18, 2009 3:48 PM  Follow-up for Phone Call        called and spoke with pt.  pt states she had called 11-12-2009 ( see phone note for complete details) wanting 90 day supply of her meds faxed to [facility].  Pt state she has called [employer] and they received her husband's rx but not her.  Informed her I will again re-fax Rx to [facility]  Megan Reynolds LPN  November 18, 2009 3:55 PM     Prescriptions: TRAMADOL HCL 50 MG TABS (TRAMADOL HCL) take one tablet by mouth three times a day as needed for pain  #90 x 3   Entered by:   Arman Filter LPN   Authorized by:   Michele Mcalpine MD   Signed by:   Arman Filter LPN on 04/54/0981   Method used:   Printed then faxed to ...       [facility]  [id number] (ret[facility])       [address]       [place], [place]  [address]       Ph: [phone] or [phone]       Fax: [phone]   RxID:   [id number] GABAPENTIN 300 MG CAPS (GABAPENTIN) take 1 cap by mouth two times a day...  #180 x 3   Entered by:   Arman Filter LPN   Authorized by:   Michele Mcalpine MD   Signed by:   Arman Filter LPN on 63/87/5643   Method used:   Printed then faxed to ...       [id number]       [address]       [place], [place]  [address]       Ph: [phone] or [phone]       Fax: [phone]   RxID:   [id number] PRILOSEC 20 MG CPDR (OMEPRAZOLE) take 1 tab by mouth two times a day 30 min before meals...  #180 x 3   Entered by:   Arman Filter LPN   Authorized by:   Michele Mcalpine MD   Signed by:   Arman Filter LPN on 71/04/2693   Method used:   Printed then faxed to ...       [id number]       [address]       [place], [place]   [address]       Ph: [phone] or [phone]       Fax: [phone]   RxID:   [id number] KLOR-CON M20 20 MEQ  TBCR (POTASSIUM CHLORIDE CRYS CR) Take 1 tablet by mouth once a day  #90 Tablet x 3   Entered by:   Arman Filter LPN   Authorized by:   Michele Mcalpine MD   Signed by:   Arman Filter LPN on 31/54/0086   Method used:   Printed then faxed to ...       [facility] [id number]       [address]       [facility] [place] [place],  [place]  [address]       Ph: [phone] or [phone]       Fax: [phone]   RxID:   [id number] TRIAMTERENE-HCTZ 37.5-25 MG  TABS (TRIAMTERENE-HCTZ) Take 1 tablet by mouth once a day  #90 Tablet x 3   Entered by:   Arman Filter LPN   Authorized by:   Michele Mcalpine MD   Signed by:   Arman Filter LPN on 09/32/3557   Method used:   Printed then faxed to ...       [facility] [id number]       [address]       [place], [place]  [address]       Ph: [phone] or [phone]       Fax: [phone]   RxID:   [id number] QUINAPRIL HCL 20 MG  TABS (QUINAPRIL HCL) Take 1 tablet by mouth once a day  #90 x 3   Entered by:   Arman Filter LPN   Authorized by:   Michele Mcalpine MD   Signed by:   Arman Filter LPN on 37/16/9678   Method used:   Printed then faxed to ...       [facility]       [address]       [place], [place]  [address]       Ph: [phone] or [phone]       Fax: [phone]   RxID:   [id number] METOPROLOL TARTRATE 50 MG  TABS (METOPROLOL TARTRATE) take one tablet by mouth two times a day  #180 Tablet x 3   Entered by:   Arman Filter LPN   Authorized by:   Michele Mcalpine MD   Signed by:   Arman Filter LPN on 83/38/2505   Method used:   Printed then faxed to ...       [facility]       [address]       [place], [place]  [address]       Ph: [phone] or [phone]        Fax: [phone]   RxID:   [id number] DIGOXIN 0.25 MG  TABS (DIGOXIN) Take 1 tablet by mouth once a day  #90 Tablet x 3   Entered by:   Arman Filter LPN   Authorized by:   Michele Mcalpine MD   Signed by:   Arman Filter LPN on 81/85/6314   Method used:   Printed then faxed to ...       [facility]       [address]       [place], [place]  [address]       Ph: [phone] or [phone]       Fax: [phone]   RxID:   [id number] FLUTICASONE PROPIONATE 50 MCG/ACT SUSP (FLUTICASONE PROPIONATE) 2 sp in each nostril two times a day...  #48 grams x 3   Entered by:   Arman Filter LPN   Authorized by:   Michele Mcalpine MD   Signed by:   Arman Filter LPN on 81/27/5170   Method used:   Printed then faxed to ...       [phone]  [address]       [place], [place]  [address]       Ph: [phone] or [phone]       Fax: [phone]   RxID:   [id number]  rx faxed to [facility]  ( not [facility] [address] [phone]  Appended Document: medication called and spoke with pt about [facility] has denied her fluticasone---she is aware of this and will call them to find out what is covered.  she was going to call anyway due to them not sending out her rx that was sent in on 1-4 for pt.

## 2010-12-15 NOTE — Progress Notes (Signed)
Summary: Question about medication  Phone Note Call from Patient Call back at Home Phone [phone]   Caller: Patient Summary of Call: Pt have question about medication Initial call taken by: Judie Grieve,  July 09, 2010 3:29 PM  Follow-up for Phone Call        Pt called.  Concerned over taking amiodarone and Coumadin together. Told pt that amiodarone does cause an increase in INR but we are aware and monitoring her closely.  HH checked INR today.  POC INR was 5.0.  RN drew venopuncture but no results available yet.  Instructed pt to hold Coumadin today and will contact her in am with further dosing instructions.  Follow-up by: Weston Brass PharmD,  July 09, 2010 3:49 PM

## 2010-12-15 NOTE — Medication Information (Signed)
Summary: rov/sl  Anticoagulant Therapy  Managed by: Reina Fuse, PharmD Referring MD: Tonny Bollman PCP: Alroy Dust, MD Supervising MD: Antoine Poche MD, Fayrene Fearing Indication 1: Atrial Fibrillation (ICD-427.31) Lab Used: [facility] Site: [address] INR POC 2.4 INR RANGE 2 - 3  Dietary changes: no    Health status changes: no    Bleeding/hemorrhagic complications: yes       Details: Pt reports coughing up some blood in mucous x 1.  Recent/future hospitalizations: no    Any changes in medication regimen? no    Recent/future dental: no  Any missed doses?: no       Is patient compliant with meds? yes       Allergies: 1)  ! Demerol 2)  ! Codeine 3)  ! Zocor 4)  ! Lipitor 5)  ! Requip (Ropinirole Hcl) 6)  ! Zetia (Ezetimibe) 7)  ! * Emycin 8)  ! Morphine  Anticoagulation Management History:      The patient is taking warfarin and comes in today for a routine follow up visit.  Positive risk factors for bleeding include an age of 47 years or older.  The bleeding index is 'intermediate risk'.  Positive CHADS2 values include History of CHF and History of HTN.  Negative CHADS2 values include Age > 27 years old.  The start date was 05/13/1999.  Anticoagulation responsible provider: Antoine Poche MD, Fayrene Fearing.  INR POC: 2.4.  Cuvette Lot#: [id number].  Exp: 09/2011.    Anticoagulation Management Assessment/Plan:      The patient's current anticoagulation dose is Coumadin 5 mg tabs: as directed.  The target INR is 2 - 3.  The next INR is due 10/22/2010.  Anticoagulation instructions were given to patient.  Results were reviewed/authorized by Reina Fuse, PharmD.  She was notified by Reina Fuse PharmD.         Prior Anticoagulation Instructions: INR 2.4  Continue taking Coumadin 2.5 mg (0.5 tab) every day. Return to clinic in 3 weeks.   Current Anticoagulation Instructions: INR 2.4  Continue taking Coumadin 0.5 tab (2.5 mg) every day. Return to clinic in 4 weeks.

## 2010-12-15 NOTE — Procedures (Signed)
Summary: Upper Endoscopy  Patient: [name] Note: All result statuses are Final unless otherwise noted.  Tests: (1) Upper Endoscopy (EGD)   EGD Upper Endoscopy       DONE     [facility]     [address]     [place], [place]  [address]           ENDOSCOPY PROCEDURE REPORT           PATIENT:  Felicia, [name]  MR#:  [id number]     BIRTHDATE:  1935/02/13, 74 yrs. old  GENDER:  female           ENDOSCOPIST:  Hedwig Morton. Juanda Chance, MD     Referred by:           PROCEDURE DATE:  01/27/2010     PROCEDURE:  [name] of Esophagus, EGD w/brushings     ASA CLASS:  Class II     INDICATIONS:  dysphagia, abnl Ba esophagram 10/2009 with delay in     Barium tablet at g-e junction,     known motility disorder,     last EGD 2009 showed Candida esophaggitis, no stricture           MEDICATIONS:   Versed 6 mg, Fentanyl 50 mcg     TOPICAL ANESTHETIC:  Exactacain Spray           DESCRIPTION OF PROCEDURE:   After the risks benefits and     alternatives of the procedure were thoroughly explained, informed     consent was obtained.  The [id number] endoscope was     introduced through the mouth and advanced to the second portion of     the duodenum, without limitations.  The instrument was slowly     withdrawn as the mucosa was fully examined.     <<PROCEDUREIMAGES>>           Esophagitis was found in the total esophagus. heavy exudate and     particles adherent to esophageal wall, no stricture, A brushing     for microbiology was obtained for fungal analysis (see image1,     image2, image10, image9, image8, and image7). [name] with     maloney dilator 37F maloney dil passed without difficulty     Otherwise the examination was normal (see image5, image6, image4,     and image3). several fundic gland polyps    Retroflexed views     revealed no abnormalities.    The scope was then withdrawn from     the patient and the procedure completed.           COMPLICATIONS:   None           ENDOSCOPIC IMPRESSION:     1) Esophagitis in the total esophagus     2) Otherwise normal examination     Candida esophagitis vs retained food and pills in the esophagus     due to dismotility,     no definite stricture, s/p passage of 37F maloney dil     RECOMMENDATIONS:     1) Await pathology results     2) Anti-reflux regimen to be follow     continuePrelosec 20 mg po qd     dysphagia diet           REPEAT EXAM:  In 0 year(s) for.           ______________________________     Hedwig Morton. Juanda Chance, MD  CC:           n.     eSIGNED:   Sherrilyn Nairn M. Aleczander Fandino at 01/27/2010 11:55 AM           Page 2 of 3   Zaidee, Rion Maskell, [id number]  Note: An exclamation mark (!) indicates a result that was not dispersed into the flowsheet. Document Creation Date: 01/27/2010 11:56 AM _______________________________________________________________________  (1) Order result status: Final Collection or observation date-time: 01/27/2010 11:43 Requested date-time:  Receipt date-time:  Reported date-time:  Referring Physician:   Ordering Physician: Lina Sar [phone]) Specimen Source:  Source: Launa Grill Order Number: [id number] Lab site:

## 2010-12-15 NOTE — Medication Information (Signed)
Summary: Coumadin Clinic  Anticoagulant Therapy  Managed by: Bethena Midget, RN, BSN Referring MD: Tonny Bollman PCP: Alroy Dust, MD Supervising MD: Eden Emms MD, Theron Arista Indication 1: Atrial Fibrillation (ICD-427.31) Lab Used: [facility] Site: [address] PT 32.0 INR POC 3.19 INR RANGE 2 - 3  Dietary changes: no    Health status changes: no    Bleeding/hemorrhagic complications: yes       Details: some blood-tinged sputum this morning  Recent/future hospitalizations: no    Any changes in medication regimen? no    Recent/future dental: no  Any missed doses?: no       Is patient compliant with meds? yes       Allergies: 1)  ! Demerol 2)  ! Codeine 3)  ! Zocor 4)  ! Lipitor 5)  ! Requip (Ropinirole Hcl) 6)  ! Zetia (Ezetimibe) 7)  ! * Emycin 8)  ! Morphine  Anticoagulation Management History:      Her anticoagulation is being managed by telephone today.  Positive risk factors for bleeding include an age of 46 years or older.  The bleeding index is 'intermediate risk'.  Positive CHADS2 values include History of CHF and History of HTN.  Negative CHADS2 values include Age > 67 years old.  The start date was 05/13/1999.  Prothrombin time is 32.0.  Anticoagulation responsible provider: Eden Emms MD, Theron Arista.  INR POC: 3.19.    Anticoagulation Management Assessment/Plan:      The patient's current anticoagulation dose is Coumadin 5 mg tabs: 5mg  every other day 2.5 every other day.  The target INR is 2 - 3.  The next INR is due 07/28/2010.  Anticoagulation instructions were given to patient.  Results were reviewed/authorized by Bethena Midget, RN, BSN.  She was notified by Bethena Midget, RN, BSN.         Prior Anticoagulation Instructions: INR 3.5  Called spoke with pt advised to hold today's dosage of Coumadin, then start taking 1/2 tablet daily except 1 tablet on Mondays and Fridays.   Current Anticoagulation Instructions: INR 3.19 Change dose to 2.5mg s daily except 5mg s on  Mondays. Recheck in one week.

## 2010-12-15 NOTE — Assessment & Plan Note (Signed)
Summary: ROV   Visit Type:  Follow-up Referring Provider:  n/a Primary Provider:  Alroy Dust, MD  CC:  Bilateral leg edema.  History of Present Illness: 76 year-old woman presenting for follow-up evaluation.  She underwent emergency surgery for Type A aortic dissection earlier this year. She underwent aortic root repair and CABG of the LAD and LCx. She has a remote history of surgical repair of ASD 1982 and paroxysmal atrial fibrillation treated with long-term warfarin.  She has also had paroxysmal atrial fibrillation and has been treated with Amiodarone.   The patient continues to complain of edema and shortness of breath with exertion.  She denies chest pain, orthopnea, or PND.   Current Medications (verified): 1)  Fluticasone Propionate 50 Mcg/act Susp (Fluticasone Propionate) .Marland Kitchen.. 1-2 Sp in Each Nostril At Bedtime.Marland KitchenMarland Kitchen 2)  Coumadin 5 Mg Tabs (Warfarin Sodium) .... As Directed 3)  Metoprolol Tartrate 50 Mg  Tabs (Metoprolol Tartrate) .... Take One Tablet By Mouth Two Times A Day 4)  Crestor 10 Mg Tabs (Rosuvastatin Calcium) .... Take One-Half  Tablet By Mouth Daily. 5)  Gabapentin 300 Mg Caps (Gabapentin) .... Take 1 Cap By Mouth Two Times A Day... 6)  Tramadol Hcl 50 Mg Tabs (Tramadol Hcl) .... Take One Tablet By Mouth Three Times A Day As Needed For Pain 7)  Zoloft 50 Mg Tabs (Sertraline Hcl) .... Take One Daily 8)  Tums 500 Mg Chew (Calcium Carbonate Antacid) .... One By Mouth As Needed 9)  Ocuvite  Tabs (Multiple Vitamins-Minerals) .... Take 1 Tablet By Mouth Two Times A Day 10)  Stool Softener 100 Mg Caps (Docusate Sodium) .... One Capsule By Mouth At Bedtime 11)  Benefiber  Powd (Wheat Dextrin) .... As Needed 12)  Anusol-Hc 25 Mg Supp (Hydrocortisone Acetate) .... Insert 1 Suppository Into Rectum At Bedtime 13)  Prilosec 20 Mg Cpdr (Omeprazole) .... Take 1 Capsule By Mouth Two Times A Day 14)  Vitamin D 1000 Unit  Tabs (Cholecalciferol) .... Take 1 Tablet By Mouth Once A Day 15)   Glucosamine-Chondroitin  Caps (Glucosamine-Chondroit-Vit C-Mn) .... Take 1 Capsule By Mouth Two Times A Day 16)  Amiodarone Hcl 200 Mg Tabs (Amiodarone Hcl) .... Take One Tablet By Mouth Once A Day 17)  Furosemide 80 Mg Tabs (Furosemide) .Marland Kitchen.. 1 Tab By Mouth Once Daily 18)  Ambien 10 Mg Tabs (Zolpidem Tartrate) .Marland Kitchen.. 1  At Bedtime As Needed 19)  Potassium Chloride Crys Cr 20 Meq Cr-Tabs (Potassium Chloride Crys Cr) .... Take One Tablet By Mouth Everytime That You Take Furosemide 20)  Aspirin 81 Mg Tbec (Aspirin) .... Take One Tablet By Mouth Daily 21)  Clorazepate Dipotassium 7.5 Mg Tabs (Clorazepate Dipotassium) .... Take 1 Tab By Mouth Every 8 Hours As Needed For Nerves.  Allergies: 1)  ! Demerol 2)  ! Codeine 3)  ! Zocor 4)  ! Lipitor 5)  ! Requip (Ropinirole Hcl) 6)  ! Zetia (Ezetimibe) 7)  ! * Emycin 8)  ! Morphine  Past History:  Past medical history reviewed for relevance to current acute and chronic problems.  Past Medical History: Reviewed history from 09/04/2010 and no changes required. 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.01)   Type A aortic dissection 2011 - emergency surgery Hx of ATRIAL SEPTAL DEFECT (ICD-745.5) VENOUS INSUFFICIENCY (ICD-459.81) EDEMA (ICD-782.3) HYPERCHOLESTEROLEMIA (ICD-272.0) DIABETES MELLITUS, BORDERLINE (ICD-790.29) GERD (ICD-530.81) Hx of DYSPHAGIA (ZOX-096.04) Hx  of ESOPHAGEAL STRICTURE (ICD-530.3) IRRITABLE BOWEL SYNDROME (ICD-564.1) PERSONAL HX COLONIC POLYPS (ICD-V12.72) Family Hx of COLON CANCER (ICD-153.9) TRANSIENT ISCHEMIC ATTACKS, HX OF (ICD-V12.50) PERIPHERAL NEUROPATHY (ICD-356.9) DEGENERATIVE JOINT DISEASE (ICD-715.90) BACK PAIN, LUMBAR (ICD-724.2) FIBROMYALGIA (ICD-729.1) ANXIETY DEPRESSION (ICD-300.4)  Review of  Systems       Positive for insomnia, otherwise negative except as per HPI   Vital Signs:  Patient profile:   76 year old female Height:      67 inches Weight:      148.75 pounds BMI:     23.38 Pulse rate:   58 / minute Pulse rhythm:   regular Resp:     18 per minute BP sitting:   110 / 70  (left arm) Cuff size:   large  Vitals Entered By: Vikki Ports (September 17, 2010 10:26 AM)  Serial Vital Signs/Assessments:  Time      Position  BP       Pulse  Resp  Temp     By           R Arm     120/70                         Vikki Ports   Physical Exam  General:  Pt is alert and oriented, in no acute distress. HEENT: normal Neck: normal carotid upstrokes without bruits, visible V waves in neck veins Lungs: CTA CV: RRR with 2/6 systolic murmur at the LSB Abd: soft, NT, positive BS, no bruit, liver pulsations present Ext:1+ pretibial edema. peripheral pulses 2+ and equal Skin: warm and dry without rash    Echocardiogram  Procedure date:  07/14/2010  Findings:       Study Conclusions    - Left ventricle: The cavity size was normal. Wall thickness was     normal. Systolic function was normal. The estimated ejection     fraction was in the range of 55% to 60%. Wall motion was normal;     there were no regional wall motion abnormalities. Doppler     parameters are consistent with restrictive physiology, indicative     of decreased left ventricular diastolic compliance and/or     increased left atrial pressure.   - Mitral valve: Calcified annulus. Moderate regurgitation.   - Left atrium: The atrium was moderately to severely dilated.   - Right atrium: The atrium was moderately dilated.   - Tricuspid valve: Severe regurgitation.   - Pulmonary arteries: Systolic pressure was mildly increased.   - Pericardium, extracardiac: A trivial pericardial effusion was     identified. There was a left pleural effusion.  EKG  Procedure date:  09/17/2010  Findings:      Sinus  bradycardia with ST-T changes consider inferoloateral ischemia  Impression & Recommendations:  Problem # 1:  ACUTE ON CHRONIC DIASTOLIC HEART FAILURE (ICD-428.33) The patient has continued signs of diastolic heart failure and her weight is increased 7 pounds from her visit in September. I suspect tricuspid regurgitation is playing a significant role, as she has physical exam and echo findings of severe TR. Will increase furosemide to 80 mg two times a day and give additional potassium supplementation. Followup BMET and BNP in one week and followup office visit in 2 weeks.  Her updated medication list for this problem includes:    Coumadin 5 Mg Tabs (Warfarin sodium) .Marland Kitchen... As directed    Metoprolol Tartrate 50 Mg Tabs (Metoprolol tartrate) .Marland Kitchen... Take one tablet by mouth  two times a day    Amiodarone Hcl 200 Mg Tabs (Amiodarone hcl) .Marland Kitchen... Take one tablet by mouth once a day    Furosemide 80 Mg Tabs (Furosemide) .Marland Kitchen... Take one tablet by mouth two times a day    Aspirin 81 Mg Tbec (Aspirin) .Marland Kitchen... Take one tablet by mouth daily  Orders: EKG w/ Interpretation ([address])  Problem # 2:  ATRIAL FIBRILLATION (ICD-427.31) Maintaining sinus rhythm on low-dose amiodarone. Will continue for now.  Her updated medication list for this problem includes:    Coumadin 5 Mg Tabs (Warfarin sodium) .Marland Kitchen... As directed    Metoprolol Tartrate 50 Mg Tabs (Metoprolol tartrate) .Marland Kitchen... Take one tablet by mouth two times a day    Amiodarone Hcl 200 Mg Tabs (Amiodarone hcl) .Marland Kitchen... Take one tablet by mouth once a day    Aspirin 81 Mg Tbec (Aspirin) .Marland Kitchen... Take one tablet by mouth daily  Patient Instructions: 1)  Your physician recommends that you schedule a follow-up appointment in: 2 WEEKS  2)  Your physician recommends that you return for lab work in: 1 WEEK (BMP and BNP 428.33, 427.31) 3)  Your physician has recommended you make the following change in your medication: Increase Furosemide to 80mg  two times a day, Increase  Potassium take 2 tablets by mouth daily Prescriptions: FUROSEMIDE 80 MG TABS (FUROSEMIDE) Take one tablet by mouth two times a day  #180 x 3   Entered by:   Julieta Gutting, RN, BSN   Authorized by:   Norva Karvonen, MD   Signed by:   Julieta Gutting, RN, BSN on 09/17/2010   Method used:   Electronically to        [facility] (retail)       [address]       [place], [place]  [address]       Ph: [phone]       Fax: [phone]   RxID:   [id number] POTASSIUM CHLORIDE CRYS CR 20 MEQ CR-TABS (POTASSIUM CHLORIDE CRYS CR) take two tablets by mouth once a day  #180 x 3   Entered by:   Julieta Gutting, RN, BSN   Authorized by:   Norva Karvonen, MD   Signed by:   Julieta Gutting, RN, BSN on 09/17/2010   Method used:   Electronically to        [facility] (retail)       [address]       [place], [place]  [address]       Ph: [phone]       Fax: [phone]   RxID:   [id number]

## 2010-12-15 NOTE — Progress Notes (Signed)
  Phone Note Call from Patient   Caller: Daughter Reason for Call: Acute Illness Details for Reason: Nausea without vomiting Details of Complaint: Acute onset of Nausea, no other complaints like CP, SOB, palpitaitons, Pre/Syncope.   Details of Action Taken: Called in Phenergan 12.5mg  by mouth Q4hrs, #4, no refills Summary of Call: Pt's. daughter called to request assistance with her mother who was complaining of severe nausea for last few hours.  No CP, SOB, palps., Pre/Syncope, No F/C, no vomiting.  BP up 160/90 in L arm, 146/102 R arm.  Pulse 79.  Temp. 98.0.  Pt. did not think related to food but did half a fish sandwith for lunch.  Pt. took some Dramamine with some improvement but cont. to have mod to severe N.  I spoke with the pt. herself and confirmed all of the above.  I informed pt. that I would call in above prescription but that she should call back with any further ?s call EMS should she begin to experience SOB or CP.  Pt. agreable to this plan.  Called daughter back as she was the one who would be pick up Meds. Initial call taken by: Lenard Galloway, PA-C    Called in Phenergan 12.5mg  by mouth Q4hrs as needed, #4, no refills.

## 2010-12-15 NOTE — Letter (Signed)
Summary: Patient Notice-Endo Biopsy Results  [facility]  [address] [place], [place] [address]   Phone: [phone]  Fax: [phone]        January 28, 2010 MRN: [id number]    [name] [address] [place], [place]  [address]    Dear Ms. Laural Benes,  I am pleased to inform you that the biopsies taken during your recent endoscopic examination did not show any evidence of cancer upon pathologic examination.The specimen from Your esophagus show lot of  yeast ( fungus). Pleas take You Diflucan for 7 days, You may have to take it regularly to prevent the fungus from coming back.  Additional information/recommendations:  __No further action is needed at this time.  Please follow-up with      your primary care physician for your other healthcare needs.  _x_ Please call [phone] to schedule a return visit to review      your condition.  _x_ Continue with the treatment plan as outlined on the day of your      exam.     Please call us if you are having persistent problems or have questions about your condition that have not been fully answered at this time.  Sincerely,  Hart Carwin MD  This letter has been electronically signed by your physician.  Appended Document: Patient Notice-Endo Biopsy Results letter mailed 3.18.11

## 2010-12-15 NOTE — Progress Notes (Signed)
Summary: Has not gotten he medicine still  Phone Note Call from Patient Call back at Home Phone [phone] Call back at [phone] cell   Call For: Dr Juanda Chance Summary of Call: Needs generic Protonix sent to [facility]. We ordered for her 6wks ago and she still has not received it.  Initial call taken by: Leanor Kail [facility],  February 04, 2010 4:13 PM  Follow-up for Phone Call        I actually do not see where prilosec was ever sent to her pharmacy. I will send that for her. I have left a message for the patient with the above information and she is to call back with any questions. Follow-up by: Hortense Ramal CMA Duncan Dull),  February 04, 2010 4:23 PM    New/Updated Medications: PRILOSEC 20 MG CPDR (OMEPRAZOLE) Take 1 capsule by mouth two times a day Prescriptions: PRILOSEC 20 MG CPDR (OMEPRAZOLE) Take 1 capsule by mouth two times a day  #180 x 2   Entered by:   Hortense Ramal CMA (AAMA)   Authorized by:   Hart Carwin MD   Signed by:   Hortense Ramal CMA (AAMA) on 02/04/2010   Method used:   Faxed to ...       [facility] (mail-order)       [address]       [place], [place]  [id number]       Ph: [phone]       Fax: [phone]   RxID:   [id number]

## 2010-12-15 NOTE — Medication Information (Signed)
Summary: Coumadin Clinic  Anticoagulant Therapy  Managed by: Cloyde Reams, RN, BSN Referring MD: Tonny Bollman PCP: Alroy Dust, MD Supervising MD: Excell Seltzer MD, Casimiro Needle Indication 1: Atrial Fibrillation (ICD-427.31) Lab Used: [facility] Site: [address] PT 17.6 INR POC 1.8 INR RANGE 2 - 3  Dietary changes: no    Health status changes: no    Bleeding/hemorrhagic complications: no    Recent/future hospitalizations: yes       Details: Pt was hospitalized for UTI 8/4-8/8. Send home on Cipro.  Any changes in medication regimen? yes       Details: Cipro for UTI, d/c Amiodarone   Any missed doses?: no       Is patient compliant with meds? yes      Comments: Discharged on 06/22/10 on 2mg  Coumadin daily per pt's daughter.  Pt's previous dosage 5mg  daily except 2.5mg  on Thursdays.    Allergies: 1)  ! Demerol 2)  ! Codeine 3)  ! Zocor 4)  ! Lipitor 5)  ! Requip (Ropinirole Hcl) 6)  ! Zetia (Ezetimibe) 7)  ! * Emycin  Anticoagulation Management History:      Her anticoagulation is being managed by telephone today.  Positive risk factors for bleeding include an age of 74 years or older.  The bleeding index is 'intermediate risk'.  Positive CHADS2 values include History of HTN.  Negative CHADS2 values include Age > 74 years old.  The start date was 05/13/1999.  Prothrombin time is 17.6.  Anticoagulation responsible provider: Excell Seltzer MD, Casimiro Needle.  INR POC: 1.8.  Exp: 07/2011.    Anticoagulation Management Assessment/Plan:      The patient's current anticoagulation dose is Coumadin 5 mg tabs: take as directed by the Coumadin Clinic....  The target INR is 2 - 3.  The next INR is due 06/25/2010.  Anticoagulation instructions were given to patient.  Results were reviewed/authorized by Cloyde Reams, RN, BSN.  She was notified by Cloyde Reams RN.         Prior Anticoagulation Instructions: INR 1.9  Continue same dose of 1 tablet every day except 1/2 tablet on Thursday.  Spoke with  pt's daughter while in home.  Pt is aware.  Orders sent to [name] to recheck INR in 1 week.   Current Anticoagulation Instructions: INR 1.8  Called spoke with pt's daughter Misty Stanley, advised per Charolotte Eke, Pharm D have pt start taking 5mg /2.5mg  alternating.  Recheck on Monday 06/29/10. Orders faxed to [facility].

## 2010-12-15 NOTE — Letter (Signed)
Summary: [facility]  [facility]   Imported By: Sherian Rein 07/21/2010 09:31:52  _____________________________________________________________________  External Attachment:    Type:   Image     Comment:   External Document

## 2010-12-15 NOTE — Medication Information (Signed)
Summary: Coumadin Clinic  Anticoagulant Therapy  Managed by: Weston Brass, PharmD Referring MD: Tonny Bollman PCP: Alroy Dust, MD Supervising MD: Clifton James MD, Cristal Deer Indication 1: Atrial Fibrillation (ICD-427.31) Lab Used: [facility] Site: [address] INR POC 1.9 INR RANGE 2 - 3  Dietary changes: no    Health status changes: no    Bleeding/hemorrhagic complications: no    Recent/future hospitalizations: yes       Details: pt discharged on 8/4 after aortic dissection and CABG.  INR on discharge 1.4  Any changes in medication regimen? yes       Details: started amiodarone during hospitalization.   Recent/future dental: no  Any missed doses?: no       Is patient compliant with meds? yes      Comments: Discharged on 5mg  daily except 2.5mg  on Thursday per daughter  Allergies: 1)  ! Demerol 2)  ! Codeine 3)  ! Zocor 4)  ! Lipitor 5)  ! Requip (Ropinirole Hcl) 6)  ! Zetia (Ezetimibe) 7)  ! * Emycin  Anticoagulation Management History:      Her anticoagulation is being managed by telephone today.  Positive risk factors for bleeding include an age of 51 years or older.  The bleeding index is 'intermediate risk'.  Positive CHADS2 values include History of HTN.  Negative CHADS2 values include Age > 37 years old.  The start date was 05/13/1999.  Anticoagulation responsible provider: Clifton James MD, Cristal Deer.  INR POC: 1.9.  Exp: 07/2011.    Anticoagulation Management Assessment/Plan:      The patient's current anticoagulation dose is Coumadin 5 mg tabs: take as directed by the [facility]in Clinic....  The target INR is 2 - 3.  The next INR is due 06/25/2010.  Anticoagulation instructions were given to patient.  Results were reviewed/authorized by Weston Brass, PharmD.  She was notified by Weston Brass PharmD.         Prior Anticoagulation Instructions: INR 2.7  Continue same regimen of 1.5 tabs on M,W,F and 1 tab all other days.  Current Anticoagulation Instructions: INR  1.9  Continue same dose of 1 tablet every day except 1/2 tablet on Thursday.  Spoke with pt's daughter while in home.  Pt is aware.  Orders sent to [name] to recheck INR in 1 week.

## 2010-12-15 NOTE — Medication Information (Signed)
Summary: [facility]  Anticoagulant Therapy  Managed by: Cloyde Reams, RN, BSN Referring MD: Tonny Bollman PCP: Alroy Dust, MD Supervising MD: Riley Kill MD, Maisie Fus Indication 1: Atrial Fibrillation (ICD-427.31) Lab Used: [facility] Site: [address] INR POC 3.5 INR RANGE 2 - 3  Dietary changes: yes       Details: Decr appetite and vit K intake.   Bleeding/hemorrhagic complications: yes       Details: Hemoptysis resolved.    Any changes in medication regimen? yes       Details: Continues on Avelox.   Any missed doses?: no       Is patient compliant with meds? yes       Allergies: 1)  ! Demerol 2)  ! Codeine 3)  ! Zocor 4)  ! Lipitor 5)  ! Requip (Ropinirole Hcl) 6)  ! Zetia (Ezetimibe) 7)  ! * Emycin 8)  ! Morphine  Anticoagulation Management History:      Her anticoagulation is being managed by telephone today.  Positive risk factors for bleeding include an age of 74 years or older.  The bleeding index is 'intermediate risk'.  Positive CHADS2 values include History of HTN.  Negative CHADS2 values include Age > 72 years old.  The start date was 05/13/1999.  Anticoagulation responsible provider: Riley Kill MD, Maisie Fus.  INR POC: 3.5.  Exp: 07/2011.    Anticoagulation Management Assessment/Plan:      The patient's current anticoagulation dose is Coumadin 5 mg tabs: 5mg  every other day 2.5 every other day.  The target INR is 2 - 3.  The next INR is due 07/21/2010.  Anticoagulation instructions were given to patient.  Results were reviewed/authorized by Cloyde Reams, RN, BSN.  She was notified by Cloyde Reams RN.         Prior Anticoagulation Instructions: INR 4.05  Spoke with pt.  Skip today's dose of Coumadin then decrease dose to 1/2 tablet every day except 1 tablet on Monday and Friday.  Recheck INR on 8/29.  Orders faxed to [facility].   Current Anticoagulation Instructions: INR 3.5  Called spoke with pt advised to hold today's dosage of Coumadin, then start  taking 1/2 tablet daily except 1 tablet on Mondays and Fridays.

## 2010-12-15 NOTE — Medication Information (Signed)
Summary: Med Orders  Med Orders   Imported By: Marylou Mccoy 03/03/2010 14:49:59  _____________________________________________________________________  External Attachment:    Type:   Image     Comment:   External Document

## 2010-12-15 NOTE — Progress Notes (Signed)
Summary: Endo/Dil. Scheduled  Phone Note Outgoing Call   Call placed by: Laureen Ochs LPN,  January 16, 2010 3:34 PM Call placed to: Patient Summary of Call: Barium Esophagram report reviewed with pt. She. is scheduled for an Endo/Dil. in [facility] on 01-27-10 at 11am. (previsit on 01-21-10 at 2pm)  DR.Jerryl Holzhauer: Can she hold her Coumadin for 5 days prior?  Initial call taken by: Laureen Ochs LPN,  January 16, 2010 3:36 PM  Follow-up for Phone Call        thank You for reminding me. Yes she can hold her Coumadin for 5 days ( has PAFib) Follow-up by: Hart Carwin MD,  January 17, 2010 6:28 PM  Additional Follow-up for Phone Call Additional follow up Details #1::         JO in pre-visit made aware that order to hold her Coumadin is in EMR.Pt. aware but wants to notify her cardiologist.Will let Gavin Pound know what he says. Additional Follow-up by: Teryl Lucy RN,  January 19, 2010 8:45 AM

## 2010-12-15 NOTE — Progress Notes (Signed)
Summary: pt not sleeping  Phone Note Call from Patient Call back at [phone]   Caller: Daughter lisa Call For: nadel Complaint: Abdominal Pain Summary of Call: pt not able to sleep . she is taking ambiem what else can she take? [facility] [address] Initial call taken by: Rickard Patience,  June 18, 2010 1:17 PM  Follow-up for Phone Call        PT d/ced from hospital on 06-15-10 for aortic dissection.  Having trouble sleeping.  Diff falling asleep and staying asleep.  Daughter reports that pt is only sleeping in 5-10 min increments.  Ambien is not helping.  Pt seems extremely agitated.  Daughter reports that Valium has worked in the past.  Please advise. Abigail Miyamoto RN  June 18, 2010 1:27 PM   Additional Follow-up for Phone Call Additional follow up Details #1::        per SN---valium 5mg   #50  1/2 to 1 tab by mouth three times a day as needed for nerves with 2 refills.  thanks Randell Loop CMA  June 18, 2010 3:50 PM     Additional Follow-up for Phone Call Additional follow up Details #2::    called and spoke with pt's daughter.  daughter aware rx sent to pharmacy .  Aundra Millet Reynolds LPN  June 18, 2010 4:15 PM   New/Updated Medications: VALIUM 5 MG TABS (DIAZEPAM) take 1/2 to 1 tab by mouth three times a day as needed for nerves Prescriptions: VALIUM 5 MG TABS (DIAZEPAM) take 1/2 to 1 tab by mouth three times a day as needed for nerves  #50 x 2   Entered by:   Arman Filter LPN   Authorized by:   Michele Mcalpine MD   Signed by:   Arman Filter LPN on 47/82/9562   Method used:   Telephoned to ...       [facility]  [id number] ([facility]       [address]       [place], [place]  [address]       Ph: [phone] or [phone]       Fax: [phone]   RxID:   [id number]

## 2010-12-15 NOTE — Progress Notes (Signed)
Summary: drainage in anus area  Phone Note Call from Patient   Caller: Patient Call For: nadel Summary of Call: open sore near anus area that is draining would like to be seen today Initial call taken by: Rickard Patience,  November 28, 2009 8:56 AM  Follow-up for Phone Call        called, spoke with pt.  Pt states she has an open place the size of a pencil eraser near her rectum that started draining purlant drainage yesterday.  Will forward to SN-TP's schedule is booked.  Would you like to add pt at end of day or tell her to go to ER?  please advise.  Thanks!  Follow-up by: Gweneth Dimitri RN,  November 28, 2009 9:33 AM  Additional Follow-up for Phone Call Additional follow up Details #1::        per SN-----see if Amy in GI can see her today---if not then add her onto SN schedule this afternoon.  thanks Randell Loop CMA  November 28, 2009 10:16 AM     Additional Follow-up for Phone Call Additional follow up Details #2::    called GI, spoke with Pam who is working with Amy today.  Per Elita Quick,  Amy is in office today only this am and if pt can be in office soon than Amy can see her.  Called, spoke with pt. Pt informed SN would like her to see Amy with GI but Amy is only in office this morning.  Pt states she can leave her house now and be at offiice in about .  Informed pt to report to 3rd floor GI to see Amy.  She verbalized understanding.  Call Pam back, informed her pt can be at office in about 30 minutes-She is ok with this and stated Amy would see her today.   Follow-up by: Gweneth Dimitri RN,  November 28, 2009 10:34 AM

## 2010-12-15 NOTE — Medication Information (Signed)
Summary: [name]  Anticoagulant Therapy  Managed by: Cloyde Reams, RN, BSN Referring MD: Tonny Bollman PCP: Alroy Dust, MD Supervising MD: Juanda Chance MD, Avice Funchess Indication 1: Atrial Fibrillation (ICD-427.31) Lab Used: [facility] Site: [facility] INR POC 1.8 INR RANGE 2 - 3  Dietary changes: no    Health status changes: no    Bleeding/hemorrhagic complications: no    Recent/future hospitalizations: no    Any changes in medication regimen? yes       Details: Nystatin swish last dose taken today.    Recent/future dental: no  Any missed doses?: no       Is patient compliant with meds? yes       Allergies: 1)  ! Demerol 2)  ! Codeine 3)  ! Zocor 4)  ! Lipitor 5)  ! Requip (Ropinirole Hcl) 6)  ! Zetia (Ezetimibe) 7)  ! * Emycin  Anticoagulation Management History:      The patient is taking warfarin and comes in today for a routine follow up visit.  Positive risk factors for bleeding include an age of 16 years or older.  The bleeding index is 'intermediate risk'.  Positive CHADS2 values include History of HTN.  Negative CHADS2 values include Age > 49 years old.  The start date was 05/13/1999.  Anticoagulation responsible provider: Juanda Chance MD, Smitty Cords.  INR POC: 1.8.  Exp: 03/2011.    Anticoagulation Management Assessment/Plan:      The patient's current anticoagulation dose is Coumadin 5 mg tabs: take as directed by the [facility]....  The target INR is 2 - 3.  The next INR is due 04/02/2010.  Anticoagulation instructions were given to patient.  Results were reviewed/authorized by Cloyde Reams, RN, BSN.  She was notified by Cloyde Reams RN.         Prior Anticoagulation Instructions: INR 2.9 Change dose to 5mg s daily except to 7.5mg s on Mondays and Fridays. Recheck in 3 weeks.   Current Anticoagulation Instructions: INR 1.8  Take 2 tablets today, then start taking 1 tablet daily except 1.5 tablets on Mondays, Wednesdays, and Fridays.  Recheck in 3 weeks.

## 2010-12-15 NOTE — Medication Information (Signed)
Summary: ccr/ gd  Anticoagulant Therapy  Managed by: Weston Brass, PharmD Referring MD: Tonny Bollman PCP: Alroy Dust, MD Supervising MD: Juanda Chance MD, Adrienne Trombetta Indication 1: Atrial Fibrillation (ICD-427.31) Lab Used: [facility] Site: [address] INR POC 1.9 INR RANGE 2 - 3  Dietary changes: no    Health status changes: yes       Details: aortic splent  Bleeding/hemorrhagic complications: no    Recent/future hospitalizations: yes    Any changes in medication regimen? yes       Details: Takes Tranxene occasionally.   Recent/future dental: no  Any missed doses?: no       Is patient compliant with meds? yes       Allergies: 1)  ! Demerol 2)  ! Codeine 3)  ! Zocor 4)  ! Lipitor 5)  ! Requip (Ropinirole Hcl) 6)  ! Zetia (Ezetimibe) 7)  ! * Emycin 8)  ! Morphine  Anticoagulation Management History:      The patient is taking warfarin and comes in today for a routine follow up visit.  Positive risk factors for bleeding include an age of 74 years or older.  The bleeding index is 'intermediate risk'.  Positive CHADS2 values include History of CHF and History of HTN.  Negative CHADS2 values include Age > 74 years old.  The start date was 05/13/1999.  Anticoagulation responsible provider: Juanda Chance MD, Smitty Cords.  INR POC: 1.9.  Cuvette Lot#: [id number].  Exp: 09/2011.    Anticoagulation Management Assessment/Plan:      The patient's current anticoagulation dose is Coumadin 5 mg tabs: as directed.  The target INR is 2 - 3.  The next INR is due 09/03/2010.  Anticoagulation instructions were given to patient.  Results were reviewed/authorized by Weston Brass, PharmD.  She was notified by Ilean Skill D candidate.         Prior Anticoagulation Instructions: INR 3.0  LMOM for pt. Weston Brass PharmD  August 07, 2010 3:41 PM   [name] for pt to continue 2.5mg  daily over weekend and would f/u on Monday. Weston Brass PharmD  August 07, 2010 4:34 PM   Spoke with pt.  Continue same dose of  1/2 tablet every day.  Recheck INR in 2 weeks.     Current Anticoagulation Instructions: INR 1.9  Take 1 tablet today, then continue taking 1/2 tablet everyday. Recheck in 2 weeks.

## 2010-12-15 NOTE — Progress Notes (Signed)
Summary: appt  Phone Note Call from Patient Call back at [phone]   Caller: Daughter Rhona Raider Reason for Call: Talk to Nurse Summary of Call: pt daughter wants her mother and father to come to see Dr Excell Seltzer on the same day. Her father has an appt for 9/1 and mother needs to be seen within 3 weeks per d/c summary. I dont see anything til 9/12 with Excell Seltzer Initial call taken by: Edman Circle,  June 18, 2010 9:26 AM  Follow-up for Phone Call        Asher Muir, please schedule the pt at the sametime on 07/16/10 as the husband's appt.  Thanks Follow-up by: Julieta Gutting, RN, BSN,  June 18, 2010 9:37 AM     Appended Document: appt pt daughter aware of appt

## 2010-12-15 NOTE — Progress Notes (Signed)
Summary: speak to leigh  Phone Note Call from Patient Call back at Home Phone [phone]   Caller: Patient Call For: Jeffrie Lofstrom Summary of Call: pt requests to speak to leigh again re: humana/ fluticasone.  Initial call taken by: Tivis Ringer,  November 26, 2009 4:44 PM  Follow-up for Phone Call        called and spoke with pt and she is aware of rx sent to pharmacy Randell Loop CMA  November 26, 2009 5:20 PM     New/Updated Medications: FLUTICASONE PROPIONATE 50 MCG/ACT SUSP (FLUTICASONE PROPIONATE) 1-2 sp in each nostril at bedtime... Prescriptions: FLUTICASONE PROPIONATE 50 MCG/ACT SUSP (FLUTICASONE PROPIONATE) 1-2 sp in each nostril at bedtime...  #3 x 3   Entered by:   Randell Loop CMA   Authorized by:   Michele Mcalpine MD   Signed by:   Randell Loop CMA on 11/26/2009   Method used:   Faxed to ...       [facility] (mail-order)       [address]       [place], [place]  [id number]       Ph: [phone]       Fax: [phone]   RxID:   [id number]

## 2010-12-15 NOTE — Progress Notes (Signed)
Summary: medication question  Phone Note Call from Patient Call back at Home Phone [phone]   Initial call taken by: Omer Jack,  June 18, 2010 11:50 AM Caller: Daughter/Lisa Reason for Call: Talk to Nurse, Talk to Doctor Summary of Call: pt was discharged from the hospital and told she needed to take lasix 40mg  qd but was never given Rx for it so [facility] [address] Initial call taken by: Omer Jack,  June 18, 2010 11:54 AM  Follow-up for Phone Call        I spoke with Misty Stanley and made her aware that Lasix was not listed on the pt's discharge medication list and on the discharge summary the PA for Dr Cornelius Moras dictated that further instructions about further diuresis were pending.  I instructed Misty Stanley to contact Dr Orvan July office for further instructions about Lasix.  Misty Stanley agreed with plan.  Follow-up by: Julieta Gutting, RN, BSN,  June 18, 2010 12:19 PM  Additional Follow-up for Phone Call Additional follow up Details #1::        I looked into the pt's med reconciliation at discharge and the pt should have a Rx for Lasix 40mg  daily for 10 days.  This was prescribed by Dr Cornelius Moras.  I made Misty Stanley aware of this information and she has already left a message for Dr Orvan July office to call her back.  Additional Follow-up by: Julieta Gutting, RN, BSN,  June 18, 2010 12:29 PM

## 2010-12-15 NOTE — Assessment & Plan Note (Signed)
Summary: eph   Visit Type:  Post-hospital Referring Provider:  n/a Primary Provider:  Alroy Dust, MD  CC:  Cough with blood- Sob- Legs edema.  History of Present Illness: 76 year-old woman presenting for follow-up evaluation after recent emergency surgery for Type A aortic dissection. She underwent aortic root repair and CABG of the LAD and LCx. She has a remote history of surgical repair of ASD 1982 and paroxysmal atrial fibrillation treated with long-term warfarin.  At the time of her initial hospital f/u she complained of frequent heart pounding. Amiodarone was resumed at that point because of presumed PAF. This symptoms has improved, but she now complains of dyspnea with exertion and orthopnea. No chest pain. Mild leg swelling.   Current Medications (verified): 1)  Fluticasone Propionate 50 Mcg/act Susp (Fluticasone Propionate) .Marland Kitchen.. 1-2 Sp in Each Nostril At Bedtime.Marland KitchenMarland Kitchen 2)  Coumadin 5 Mg Tabs (Warfarin Sodium) .... 5mg  Every Other Day 2.5 Every Other Day 3)  Nitrostat 0.4 Mg Subl (Nitroglycerin) .... Use As Needed Chest Pain 4)  Metoprolol Tartrate 50 Mg  Tabs (Metoprolol Tartrate) .... Take One Tablet By Mouth Two Times A Day 5)  Crestor 10 Mg Tabs (Rosuvastatin Calcium) .... Take One-Half  Tablet By Mouth Daily. 6)  Gabapentin 300 Mg Caps (Gabapentin) .... Take 1 Cap By Mouth Two Times A Day... 7)  Tramadol Hcl 50 Mg Tabs (Tramadol Hcl) .... Take One Tablet By Mouth Three Times A Day As Needed For Pain 8)  Zoloft 50 Mg Tabs (Sertraline Hcl) .... Take One Daily 9)  Tums 500 Mg Chew (Calcium Carbonate Antacid) .... One By Mouth As Needed 10)  Ocuvite  Tabs (Multiple Vitamins-Minerals) .... Take 1 Tablet By Mouth Two Times A Day 11)  Stool Softener 100 Mg Caps (Docusate Sodium) .... One Capsule By Mouth At Bedtime 12)  Benefiber  Powd (Wheat Dextrin) .... As Needed 13)  Anusol-Hc 25 Mg Supp (Hydrocortisone Acetate) .... Insert 1 Suppository Into Rectum At Bedtime 14)  Prilosec 20 Mg  Cpdr (Omeprazole) .... Take 1 Capsule By Mouth Two Times A Day 15)  Vitamin D 1000 Unit  Tabs (Cholecalciferol) .... Take 1 Tablet By Mouth Once A Day 16)  Aspirin 81 Mg Tbec (Aspirin) .... Take One Tablet By Mouth Daily 17)  Glucosamine-Chondroitin  Caps (Glucosamine-Chondroit-Vit C-Mn) .... Take 1 Capsule By Mouth Two Times A Day 18)  Amiodarone Hcl 200 Mg Tabs (Amiodarone Hcl) .... Take One Tablet By Mouth Twice A Day 19)  Furosemide 40 Mg Tabs (Furosemide) .... Take One Tablet By Mouth Daily As Needed For Swelling. 20)  Ambien 10 Mg Tabs (Zolpidem Tartrate) .... 1/2 At Bedtime As Needed 21)  Avelox 400 Mg Tabs (Moxifloxacin Hcl) .... Take One Tablet By Mouth Once Daily 22)  Potassium Chloride Crys Cr 20 Meq Cr-Tabs (Potassium Chloride Crys Cr) .... Take One Tablet By Mouth Daily As Needed  Allergies: 1)  ! Demerol 2)  ! Codeine 3)  ! Zocor 4)  ! Lipitor 5)  ! Requip (Ropinirole Hcl) 6)  ! Zetia (Ezetimibe) 7)  ! * Emycin 8)  ! Morphine  Past History:  Past medical history reviewed for relevance to current acute and chronic problems.  Past Medical History: Reviewed history from 07/02/2010 and no changes required. Type A aortic dissection 2011 - emergency surgery PAROXYSMAL ATRIAL FIBRILLATION (ICD-427.31) TRANSIENT ISCHEMIC ATTACKS, HX OF (ICD-V12.50) HYPERTENSION (ICD-401.9) HYPERCHOLESTEROLEMIA (ICD-272.0) VENOUS INSUFFICIENCY (ICD-459.81) PERIPHERAL NEUROPATHY (ICD-356.9) ACUTE MAXILLARY SINUSITIS (ICD-461.0) OBSTRUCTIVE SLEEP APNEA (ICD-327.23) Hx of ATRIAL SEPTAL DEFECT (  ICD-745.5) DIABETES MELLITUS, BORDERLINE (ICD-790.29) GERD (ICD-530.81) DYSPHAGIA (ICD-787.29) Hx of ESOPHAGEAL STRICTURE (ICD-530.3) IRRITABLE BOWEL SYNDROME, HX OF (ICD-V12.79) Family Hx of COLON CANCER (ICD-153.9) DEGENERATIVE JOINT DISEASE (ICD-715.90) BACK PAIN, LUMBAR (ICD-724.2) FIBROMYALGIA (ICD-729.1) ANXIETY DEPRESSION (ICD-300.4)  Review of Systems       Positive for insomnia,  otherwise negative except as per HPI  Vital Signs:  Patient profile:   76 year old female Height:      67 inches Weight:      153.50 pounds BMI:     24.13 Pulse rate:   64 / minute Pulse rhythm:   regular Resp:     18 per minute BP sitting:   120 / 74  (left arm) Cuff size:   large  Vitals Entered By: Vikki Ports (July 16, 2010 3:25 PM)  Physical Exam  General:  Pt is alert and oriented, in no acute distress. HEENT: normal Neck: normal carotid upstrokes without bruits, JVP normal Lungs: bibasilar rales CV: RRR with 2/6 systolic murmur at the LSB Abd: soft, NT, positive BS, no bruit, no organomegaly Ext: trace bilateral pretibial edema. peripheral pulses 2+ and equal Skin: warm and dry without rash    EKG  Procedure date:  07/16/2010  Findings:      NSR with nonspecific ST-T abnormality possibly dig effect, HR 64 bpm.  Echocardiogram  Procedure date:  07/14/2010  Findings:      Study Conclusions            - Left ventricle: The cavity size was normal. Wall thickness was       normal. Systolic function was normal. The estimated ejection       fraction was in the range of 55% to 60%. Wall motion was normal;       there were no regional wall motion abnormalities. Doppler       parameters are consistent with restrictive physiology, indicative       of decreased left ventricular diastolic compliance and/or       increased left atrial pressure.     - Mitral valve: Calcified annulus. Moderate regurgitation.     - Left atrium: The atrium was moderately to severely dilated.     - Right atrium: The atrium was moderately dilated.     - Tricuspid valve: Severe regurgitation.     - Pulmonary arteries: Systolic pressure was mildly increased.     - Pericardium, extracardiac: A trivial pericardial effusion was       identified. There was a left pleural effusion.     Impressions:            - Left pleural effusion with echodense area; cannot exclude mass;        suggest CT scan to further evaluate.  Impression & Recommendations:  Problem # 1:  ACUTE ON CHRONIC DIASTOLIC HEART FAILURE (ICD-428.33) Recommend increase furosemide to 80 mg two times a day for 2 days, then back to 80 mg once daily. She has evidence of volume overload and congestion by exam and history. Will f/u with labs and return visit within a few weeks. Suspect symptoms related to combination of diastolic dysfunction and tricuspid regurgitation.  Her updated medication list for this problem includes:    Coumadin 5 Mg Tabs (Warfarin sodium) ..... 5mg  every other day 2.5 every other day    Nitrostat 0.4 Mg Subl (Nitroglycerin) ..... Use as needed chest pain    Metoprolol Tartrate 50 Mg Tabs (Metoprolol tartrate) .Marland Kitchen... Take one tablet by mouth two  times a day    Aspirin 81 Mg Tbec (Aspirin) .Marland Kitchen... Take one tablet by mouth daily    Amiodarone Hcl 200 Mg Tabs (Amiodarone hcl) .Marland Kitchen... Take one tablet by mouth once a day    Furosemide 80 Mg Tabs (Furosemide) .Marland Kitchen... Take one tablet by mouth two times a day for 2 days, then decrease to once a day  Orders: EKG w/ Interpretation (93000)  Problem # 2:  DISSECTING AORTIC ANEURYSM THORACIC (ICD-441.01) Stable by post-op echo.  Problem # 3:  PAROXYSMAL ATRIAL FIBRILLATION (ICD-427.31) Pt in NSR today and palps are better. Recommend decrease amiodarone to 200 mg daily.  Her updated medication list for this problem includes:    Coumadin 5 Mg Tabs (Warfarin sodium) ..... 5mg  every other day 2.5 every other day    Metoprolol Tartrate 50 Mg Tabs (Metoprolol tartrate) .Marland Kitchen... Take one tablet by mouth two times a day    Aspirin 81 Mg Tbec (Aspirin) .Marland Kitchen... Take one tablet by mouth daily    Amiodarone Hcl 200 Mg Tabs (Amiodarone hcl) .Marland Kitchen... Take one tablet by mouth once a day  Orders: EKG w/ Interpretation (93000)  Patient Instructions: 1)  Your physician recommends that you schedule a follow-up appointment in: 2 WEEKS 2)  Your physician recommends that  you return for lab work in: 1 WEEK (BMP and BNP--428.33, 427.31) 3)  Your physician has recommended you make the following change in your medication: DECEASE Amiodarone 200mg  once a day, INCREASE Furosemide to 80mg  two times a day for 2 days then decrease to 80mg  once a day, Please take potassium every time you take a Furosemide dose Prescriptions: FUROSEMIDE 80 MG TABS (FUROSEMIDE) take one tablet by mouth two times a day for 2 days, then decrease to once a day  #90 x 0   Entered by:   Julieta Gutting, RN, BSN   Authorized by:   Norva Karvonen, MD   Signed by:   Julieta Gutting, RN, BSN on 07/16/2010   Method used:   Electronically to        [facility] (retail)       [address]       [place], [place]  [address]       Ph: [phone]       Fax: [phone]   RxID:   [id number]

## 2010-12-15 NOTE — Medication Information (Signed)
Summary: rov/tm  Anticoagulant Therapy  Managed by: Bethena Midget, RN, BSN Referring MD: Tonny Bollman PCP: Alroy Dust, MD Supervising MD: Johney Frame MD, Fayrene Fearing Indication 1: Atrial Fibrillation (ICD-427.31) Lab Used: [facility] Site: [facility] INR POC 2.7 INR RANGE 2 - 3  Dietary changes: no    Health status changes: no    Bleeding/hemorrhagic complications: no    Recent/future hospitalizations: no    Any changes in medication regimen? yes       Details: Took Diflucan daily for 7days finished yesterday. Still on Nystatin swish and swallow 1 teaspoon TID for another week.   Recent/future dental: no  Any missed doses?: no       Is patient compliant with meds? yes      Comments: Had UGI last Tuesday, was off for 5 days prior and resumed coumadin on Tuesday after procedure.   Allergies: 1)  ! Demerol 2)  ! Codeine 3)  ! Zocor 4)  ! Lipitor 5)  ! Requip (Ropinirole Hcl) 6)  ! Zetia (Ezetimibe) 7)  ! * Emycin  Anticoagulation Management History:      The patient is taking warfarin and comes in today for a routine follow up visit.  Positive risk factors for bleeding include an age of 74 years or older.  The bleeding index is 'intermediate risk'.  Positive CHADS2 values include History of HTN.  Negative CHADS2 values include Age > 52 years old.  The start date was 05/13/1999.  Anticoagulation responsible provider: Nigel Ericsson MD, Fayrene Fearing.  INR POC: 2.7.  Cuvette Lot#: [id number].  Exp: 03/2011.    Anticoagulation Management Assessment/Plan:      The patient's current anticoagulation dose is Coumadin 5 mg tabs: take as directed by the [facility]....  The target INR is 2 - 3.  The next INR is due 02/23/2010.  Anticoagulation instructions were given to patient.  Results were reviewed/authorized by Bethena Midget, RN, BSN.  She was notified by Bethena Midget, RN, BSN.         Prior Anticoagulation Instructions: INR 2.3 continue 5mg s daily except 7.5mg s on Mondays, Wednesdays and Fridays.  Recheck in 2 weeks  Current Anticoagulation Instructions: INR 2.7 Continue 5mg s daily except 7.5mg s on Mondays, Wednesdays and Fridays. Recheck in 4 weeks.

## 2010-12-15 NOTE — Medication Information (Signed)
Summary: Coumadin Clinic  Anticoagulant Therapy  Managed by: Weston Brass, PharmD Referring MD: Tonny Bollman PCP: Alroy Dust, MD Supervising MD: Antoine Poche MD, Fayrene Fearing Indication 1: Atrial Fibrillation (ICD-427.31) Lab Used: [facility] Site: [address] PT 31.3 INR POC 3.1 INR RANGE 2 - 3  Dietary changes: yes       Details: ate brocolli last night  Health status changes: no    Bleeding/hemorrhagic complications: no    Recent/future hospitalizations: no    Any changes in medication regimen? no    Recent/future dental: no  Any missed doses?: no       Is patient compliant with meds? yes      Comments: INR on Coag machine 4.9 veinapuncture 3.1  Allergies: 1)  ! Demerol 2)  ! Codeine 3)  ! Zocor 4)  ! Lipitor 5)  ! Requip (Ropinirole Hcl) 6)  ! Zetia (Ezetimibe) 7)  ! * Emycin 8)  ! Morphine  Anticoagulation Management History:      Her anticoagulation is being managed by telephone today.  Positive risk factors for bleeding include an age of 79 years or older.  The bleeding index is 'intermediate risk'.  Positive CHADS2 values include History of HTN.  Negative CHADS2 values include Age > 65 years old.  The start date was 05/13/1999.  Prothrombin time is 31.3.  Anticoagulation responsible provider: Antoine Poche MD, Fayrene Fearing.  INR POC: 3.1.  Exp: 07/2011.    Anticoagulation Management Assessment/Plan:      The patient's current anticoagulation dose is Coumadin 5 mg tabs: 5mg  every other day 2.5 every other day.  The target INR is 2 - 3.  The next INR is due 07/13/2010.  Anticoagulation instructions were given to patient.  Results were reviewed/authorized by Weston Brass, PharmD.  She was notified by Weston Brass PharmD.         Prior Anticoagulation Instructions: INR 1.9 Change dose to 5mg s daily except 2.5mg s on TT&Sat. Recheck in one week. Orders sent to [facility].   Current Anticoagulation Instructions: INR 3.1  Attempted to reach pt.  No answer. Weston Brass PharmD  July 06, 2010 3:36 PM  Called spoke with pt's son, pt at MD appt.  Advised son to tell pt Coumadin looked OK, continue on same dosage and we will attempted to call back tomorrow with further instructions. Chihiro Frey RN  July 06, 2010 5:00 PM   Spoke with pt.  She took 1/2 tablet yesterday. Continue same dose of 1 tablet every day except 1/2 tablet on Tuesday, Thursday and Saturday.  Recheck INR in 1 week.  Orders faxed to [facility]. Weston Brass PharmD  July 07, 2010 9:00 AM

## 2010-12-15 NOTE — Progress Notes (Signed)
Summary: Symptoms  Phone Note Call from Patient Call back at [phone]   Caller: Daughter/ laurie Katrinka Blazing Summary of Call: Pt daughter calling regarding the pt medication being changed and need to to be seen soon. Initial call taken by: Judie Grieve,  July 01, 2010 3:56 PM  Follow-up for Phone Call        I spoke with the pt's [facility] Melissa who is currently in the pt's home for a visit.  The pt's BP earlier in the day was 177/111, HR over 100.  The therapist also came today and the pt's BP dropped.  The pt is having spells when her heart is racing and she gets weak.  The HHN said the pt's vitals at this time are 130/79, HR 70. [facility]  has listened to the pt's pulse and it is irregular like a skipped beat, not Afib. The [facility] feels like something is abnormal but cannot figuire out what is wrong.  The pt is remaining hydrated. The pt denies any symptoms of recurrent UTI.  Surgical area looks good per HHN.  I have arranged an appt for tomorrow with Dr Excell Seltzer.  If the pt feels worse tonight I have advised them to go to the ER for evaluation.     Follow-up by: Julieta Gutting, RN, BSN,  July 01, 2010 4:17 PM

## 2010-12-15 NOTE — Medication Information (Signed)
Summary: rov/eh  Anticoagulant Therapy  Managed by: Shelby Dubin, PharmD, BCPS, CPP Referring MD: Tonny Bollman PCP: L[facility] Brothers Supervising MD: Daleen Squibb MD, Maisie Fus Indication 1: Atrial Fibrillation (ICD-427.31) Lab Used: [facility] Site: [facility] INR POC 1.9 INR RANGE 2 - 3  Dietary changes: no    Health status changes: no    Bleeding/hemorrhagic complications: no    Recent/future hospitalizations: no    Any changes in medication regimen? yes       Details: finished flagyl several weeks ago.Marland Kitchen  Recent/future dental: no  Any missed doses?: no       Is patient compliant with meds? yes       Current Medications (verified): 1)  Fluticasone Propionate 50 Mcg/act Susp (Fluticasone Propionate) .Marland Kitchen.. 1-2 Sp in Each Nostril At Bedtime.Marland KitchenMarland Kitchen 2)  Coumadin 5 Mg Tabs (Warfarin Sodium) .... Take As Directed By The [facility]in Clinic.Marland KitchenMarland Kitchen 3)  Digoxin 0.25 Mg  Tabs (Digoxin) .... Take 1 Tablet By Mouth Once A Day 4)  Metoprolol Tartrate 50 Mg  Tabs (Metoprolol Tartrate) .... Take One Tablet By Mouth Two Times A Day 5)  Quinapril Hcl 20 Mg  Tabs (Quinapril Hcl) .... Take 1 Tablet By Mouth Once A Day 6)  Triamterene-Hctz 37.5-25 Mg  Tabs (Triamterene-Hctz) .... Take 1 Tablet By Mouth Once A Day 7)  Klor-Con M20 20 Meq  Tbcr (Potassium Chloride Crys Cr) .... Take 1 Tablet By Mouth Once A Day 8)  Crestor 5 Mg  Tabs (Rosuvastatin Calcium) .... Take 1/2 Tablet By Mouth Once A Day 9)  Fish Oil Concentrate 1000 Mg  Caps (Omega-3 Fatty Acids) .... Two Times A Day 10)  Prilosec 20 Mg Cpdr (Omeprazole) .... Take 1 Tab By Mouth Two Times A Day 30 Min Before Meals... 11)  Gabapentin 300 Mg Caps (Gabapentin) .... Take 1 Cap By Mouth Two Times A Day... 12)  Tramadol Hcl 50 Mg Tabs (Tramadol Hcl) .... Take One Tablet By Mouth Three Times A Day As Needed For Pain 13)  Ambien 10 Mg Tabs (Zolpidem Tartrate) .... Take 1/2 To 1  Tablet By Mouth At Bedtime 14)  Ocuvite  Tabs (Multiple Vitamins-Minerals) .... Take  1 Tablet By Mouth Two Times A Day 15)  Tums 500 Mg Chew (Calcium Carbonate Antacid) .... One By Mouth As Needed 16)  Nitrostat 0.4 Mg Subl (Nitroglycerin) .... Use As Needed Chest Pain 17)  Zoloft 50 Mg Tabs (Sertraline Hcl) .... Take One Daily  Allergies (verified): 1)  ! Demerol 2)  ! Codeine 3)  ! Zocor 4)  ! Lipitor 5)  ! Requip (Ropinirole Hcl) 6)  ! Zetia (Ezetimibe)  Anticoagulation Management History:      The patient is taking warfarin and comes in today for a routine follow up visit.  Positive risk factors for bleeding include an age of 52 years or older.  The bleeding index is 'intermediate risk'.  Positive CHADS2 values include History of HTN.  Negative CHADS2 values include Age > 76 years old.  The start date was 05/13/1999.  Anticoagulation responsible provider: Daleen Squibb MD, Maisie Fus.  INR POC: 1.9.  Exp: 02/2011.    Anticoagulation Management Assessment/Plan:      The patient's current anticoagulation dose is Coumadin 5 mg tabs: take as directed by the Coumadin Clinic....  The target INR is 2 - 3.  The next INR is due 01/15/2010.  Anticoagulation instructions were given to patient.  Results were reviewed/authorized by Shelby Dubin, PharmD, BCPS, CPP.  She was notified by Shelby Dubin PharmD,  BCPS, CPP.         Prior Anticoagulation Instructions: INR 2.1  Increase dose to 5mg  daily except 7.5mg  on Mondays and Thursdays. Increase green vegetable intake.  Recheck in 2 weeks.   Current Anticoagulation Instructions: INR 1.9  Take 1.5  tabs today, then 1.5 tabs each Monday, Wednesday and Friday and 1 tab on all other days.   Recheck in 2 - 3 weeks.

## 2010-12-15 NOTE — Letter (Signed)
Summary: Handout Printed  Printed Handout:  - Coumadin Instructions-w/out Meds 

## 2010-12-15 NOTE — Progress Notes (Signed)
Summary: QUESTION ABOUT HER COUMDAIN  Phone Note Call from Patient Call back at Home Phone [phone]   Caller: Patient Summary of Call: QUESTION ABOUT COUMADIN Initial call taken by: Judie Grieve,  January 22, 2010 2:04 PM  Follow-up for Phone Call        Attempted [facility] LMOM to call back. Sevilla Murtagh RN  January 22, 2010 4:52 PM   Called spoke with pt.  Pt is going to be off coumadin x 5 days spoke with Dr Excell Seltzer ok coming off coumadin.  Endoscopy scheduled for 01/27/10.  Will resume coumadin post procedure.  Cancelled appt on 02-07-2023 and r/s to 02/04/10. Follow-up by: Cloyde Reams RN,  January 23, 2010 9:58 AM

## 2010-12-15 NOTE — Assessment & Plan Note (Signed)
Summary: [name]   Visit Type:  6 months follow up Referring Provider:  n/a Primary Provider:  Alroy Dust, MD  CC:  Some palpitations once in awhile.  History of Present Illness: This is a 76 year-old woman who underwent surgical repair of a secundum ASD at [facility] in 1982. She has paroxysmal atrial fibrillation and maintains anticoagulation with coumadin. She presented wtih atypical chest pain last year and underwent an adenosine myoview that showed no ischemia.  Her main problem at present relates to GERD and esophageal spasms with stricture. She has had esophageal dilatation in past. Complains of reflux and heartburn intermittently.  Also with episodic palps, weakness.  Reports episodes occurring several times per week and she feels very tired after an episode of palps.  No chest pain, dyspnea, edema.  Current Medications (verified): 1)  Fluticasone Propionate 50 Mcg/act Susp (Fluticasone Propionate) .Marland Kitchen.. 1-2 Sp in Each Nostril At Bedtime.Marland KitchenMarland Kitchen 2)  Coumadin 5 Mg Tabs (Warfarin Sodium) .... Take As Directed By The [facility] Clinic.Marland KitchenMarland Kitchen 3)  Nitrostat 0.4 Mg Subl (Nitroglycerin) .... Use As Needed Chest Pain 4)  Digoxin 0.25 Mg  Tabs (Digoxin) .... Take 1 Tablet By Mouth Once A Day 5)  Metoprolol Tartrate 50 Mg  Tabs (Metoprolol Tartrate) .... Take One Tablet By Mouth Two Times A Day 6)  Quinapril Hcl 20 Mg  Tabs (Quinapril Hcl) .... Take 1 Tablet By Mouth Once A Day 7)  Triamterene-Hctz 37.5-25 Mg  Tabs (Triamterene-Hctz) .... Take 1 Tablet By Mouth Once A Day 8)  Klor-Con M20 20 Meq  Tbcr (Potassium Chloride Crys Cr) .... Take 1 Tablet By Mouth Once A Day 9)  Crestor 10 Mg Tabs (Rosuvastatin Calcium) .... Take One-Half  Tablet By Mouth Daily. 10)  Fish Oil Concentrate 1000 Mg  Caps (Omega-3 Fatty Acids) .... Two Times A Day 11)  Gabapentin 300 Mg Caps (Gabapentin) .... Take 1 Cap By Mouth Two Times A Day... 12)  Tramadol Hcl 50 Mg Tabs (Tramadol Hcl) .... Take One Tablet By Mouth Three Times A  Day As Needed For Pain 13)  Zoloft 50 Mg Tabs (Sertraline Hcl) .... Take One Daily 14)  Ambien 10 Mg Tabs (Zolpidem Tartrate) .... Take 1/2 To 1  Tablet By Mouth At Bedtime 15)  Tums 500 Mg Chew (Calcium Carbonate Antacid) .... One By Mouth As Needed 16)  Ocuvite  Tabs (Multiple Vitamins-Minerals) .... Take 1 Tablet By Mouth Two Times A Day 17)  Stool Softener 100 Mg Caps (Docusate Sodium) .... One Capsule By Mouth At Bedtime 18)  Benefiber  Powd (Wheat Dextrin) .... As Needed 19)  Anusol-Hc 25 Mg Supp (Hydrocortisone Acetate) .... Insert 1 Suppository Into Rectum At Bedtime 20)  Nystatin 100000 Unit/ml Susp (Nystatin) .... Swish and Swallow 5 Cc By Mouth Three Times A Day 21)  Prilosec 20 Mg Cpdr (Omeprazole) .... Take 1 Capsule By Mouth Two Times A Day 22)  Vitamin D 1000 Unit  Tabs (Cholecalciferol) .... Take 1 Tablet By Mouth Once A Day  Allergies: 1)  ! Demerol 2)  ! Codeine 3)  ! Zocor 4)  ! Lipitor 5)  ! Requip (Ropinirole Hcl) 6)  ! Zetia (Ezetimibe) 7)  ! Pollyann Samples  Past History:  Past medical history reviewed for relevance to current acute and chronic problems.  Past Medical History: Reviewed history from 01/07/2010 and no changes required. PAROXYSMAL ATRIAL FIBRILLATION (ICD-427.31) TRANSIENT ISCHEMIC ATTACKS, HX OF (ICD-V12.50) HYPERTENSION (ICD-401.9) HYPERCHOLESTEROLEMIA (ICD-272.0) VENOUS INSUFFICIENCY (ICD-459.81) PERIPHERAL NEUROPATHY (ICD-356.9) ACUTE MAXILLARY SINUSITIS (ICD-461.0) OBSTRUCTIVE  SLEEP APNEA (ICD-327.23) Hx of ATRIAL SEPTAL DEFECT (ICD-745.5) DIABETES MELLITUS, BORDERLINE (ICD-790.29) GERD (ICD-530.81) DYSPHAGIA (ICD-787.29) Hx of ESOPHAGEAL STRICTURE (ICD-530.3) IRRITABLE BOWEL SYNDROME, HX OF (ICD-V12.79) Family Hx of COLON CANCER (ICD-153.9) DEGENERATIVE JOINT DISEASE (ICD-715.90) BACK PAIN, LUMBAR (ICD-724.2) FIBROMYALGIA (ICD-729.1) ANXIETY DEPRESSION (ICD-300.4)  Review of Systems       Negative except as per HPI   Vital  Signs:  Patient profile:   76 year old female Height:      67 inches Weight:      145.50 pounds BMI:     22.87 Pulse rate:   60 / minute Pulse rhythm:   regular Resp:     18 per minute BP sitting:   122 / 80  (left arm) Cuff size:   regular  Vitals Entered By: Vikki Ports (January 13, 2010 3:30 PM)  Physical Exam  General:  Pt is alert and oriented, in no acute distress. HEENT: normal Neck: normal carotid upstrokes without bruits, JVP normal Lungs: CTA CV: RRR without murmur or gallop Abd: soft, NT, positive BS, no bruit, no organomegaly Ext: no clubbing, cyanosis, or edema. peripheral pulses 2+ and equal Skin: warm and dry without rash    EKG  Procedure date:  01/13/2010  Findings:      NSR with NSST-T abnormality, HR 60 bpm.  Impression & Recommendations:  Problem # 1:  PAROXYSMAL ATRIAL FIBRILLATION (ICD-427.31) Suspect this is the etiology of her palps. She is anticoagulated with coumadin and rate-controlled with Dig/Metoprolol. Discussed outpatient monitoring, but she declines at present. Will notify our office if worsening symptoms.  Her updated medication list for this problem includes:    Coumadin 5 Mg Tabs (Warfarin sodium) .Marland Kitchen... Take as directed by the coumadin clinic...    Digoxin 0.25 Mg Tabs (Digoxin) .Marland Kitchen... Take 1 tablet by mouth once a day    Metoprolol Tartrate 50 Mg Tabs (Metoprolol tartrate) .Marland Kitchen... Take one tablet by mouth two times a day  Orders: EKG w/ Interpretation (93000)  Problem # 2:  HYPERTENSION (ICD-401.9) BP controlled on current therapy. Her updated medication list for this problem includes:    Metoprolol Tartrate 50 Mg Tabs (Metoprolol tartrate) .Marland Kitchen... Take one tablet by mouth two times a day    Quinapril Hcl 20 Mg Tabs (Quinapril hcl) .Marland Kitchen... Take 1 tablet by mouth once a day    Triamterene-hctz 37.5-25 Mg Tabs (Triamterene-hctz) .Marland Kitchen... Take 1 tablet by mouth once a day  Orders: EKG w/ Interpretation (93000)  BP today: 122/80 Prior  BP: 120/64 (01/07/2010)  Labs Reviewed: K+: 3.9 (01/07/2010) Creat: : 0.6 (01/07/2010)   Chol: 160 (01/07/2010)   HDL: 49.60 (01/07/2010)   LDL: 94 (01/07/2010)   TG: 83.0 (01/07/2010)  Patient Instructions: 1)  Your physician recommends that you continue on your current medications as directed. Please refer to the Current Medication list given to you today. 2)  Your physician wants you to follow-up in:  6 MONTHS.  You will receive a reminder letter in the mail two months in advance. If you don't receive a letter, please call our office to schedule the follow-up appointment.

## 2010-12-15 NOTE — Medication Information (Signed)
Summary: Coumadin Clinic  Anticoagulant Therapy  Managed by: Bethena Midget, RN, BSN Referring MD: Tonny Bollman PCP: Alroy Dust, MD Supervising MD: Clifton James MD, Cristal Deer Indication 1: Atrial Fibrillation (ICD-427.31) Lab Used: [facility] Site: [address] PT 30.5 INR POC 3.1 INR RANGE 2 - 3  Dietary changes: yes       Details: Appetite poor  Health status changes: no    Bleeding/hemorrhagic complications: no    Recent/future hospitalizations: no    Any changes in medication regimen? no    Recent/future dental: no  Any missed doses?: no       Is patient compliant with meds? yes       Allergies: 1)  ! Demerol 2)  ! Codeine 3)  ! Zocor 4)  ! Lipitor 5)  ! Requip (Ropinirole Hcl) 6)  ! Zetia (Ezetimibe) 7)  ! * Emycin 8)  ! Morphine  Anticoagulation Management History:      Her anticoagulation is being managed by telephone today.  Positive risk factors for bleeding include an age of 74 years or older.  The bleeding index is 'intermediate risk'.  Positive CHADS2 values include History of CHF and History of HTN.  Negative CHADS2 values include Age > 67 years old.  The start date was 05/13/1999.  Prothrombin time is 30.5.  Anticoagulation responsible provider: Clifton James MD, Cristal Deer.  INR POC: 3.1.    Anticoagulation Management Assessment/Plan:      The patient's current anticoagulation dose is Coumadin 5 mg tabs: 5mg  every other day 2.5 every other day.  The target INR is 2 - 3.  The next INR is due 08/07/2010.  Anticoagulation instructions were given to patient.  Results were reviewed/authorized by Bethena Midget, RN, BSN.  She was notified by Bethena Midget, RN, BSN.         Prior Anticoagulation Instructions: INR 3.19 Change dose to 2.5mg s daily except 5mg s on Mondays. Recheck in one week.   Current Anticoagulation Instructions: INR 3.1 Change dose to 2.5mg s everyday. Recheck in 12 days. Orders sent to [facility].

## 2010-12-15 NOTE — Progress Notes (Signed)
  Phone Note Call from Patient   Reason for Call: Talk to Doctor Action Taken: Appt Scheduled Details for Reason: Irregular Heart Rhythm Summary of Call: [name] called stating after eating lunch today, she was standing at the sink and began to have mild dizziness.  Afterwards she sat down to read the newspaper and began to feel her heart "flip flopping."  She was told in the past that if she felt her heart get out of rhythm she was to take and additional dose of metoprolol 25mg .  She takes Metoprolol 50mg  two times a day for history of PAF.  She did so and the irregularily stopped after about .  She called to ask what to do if the irregularity returns.  I have advised her that she did the right thing taking the extra metoprolol dose.  If the irregularity returns and is sustained, she is to come to ER for evaluation and EKG.  She is not due to see Dr. Excell Seltzer until September.  I will leave a voicemail to have the office call to make appointment for sooner.  She verbalizes understanding. Initial call taken by: Joni Reining NP     Appended Document:  agreed. thx.

## 2010-12-15 NOTE — Medication Information (Signed)
Summary: rov/tm  Anticoagulant Therapy  Managed by: Leota Sauers, PharmD Referring MD: Tonny Bollman PCP: [facility] Brothers Supervising MD: Clifton James MD, Cristal Deer Indication 1: Atrial Fibrillation (ICD-427.31) Lab Used: [facility] Site: [facility] INR POC 1.5 INR RANGE 2 - 3  Dietary changes: no    Health status changes: no    Bleeding/hemorrhagic complications: no    Recent/future hospitalizations: no    Any changes in medication regimen? no    Recent/future dental: no  Any missed doses?: no       Is patient compliant with meds? yes       Current Medications (verified): 1)  Fluticasone Propionate 50 Mcg/act Susp (Fluticasone Propionate) .Marland Kitchen.. 1-2 Sp in Each Nostril At Bedtime.Marland KitchenMarland Kitchen 2)  Coumadin 5 Mg Tabs (Warfarin Sodium) .... Take As Directed By The [facility].Marland KitchenMarland Kitchen 3)  Digoxin 0.25 Mg  Tabs (Digoxin) .... Take 1 Tablet By Mouth Once A Day 4)  Metoprolol Tartrate 50 Mg  Tabs (Metoprolol Tartrate) .... Take One Tablet By Mouth Two Times A Day 5)  Quinapril Hcl 20 Mg  Tabs (Quinapril Hcl) .... Take 1 Tablet By Mouth Once A Day 6)  Triamterene-Hctz 37.5-25 Mg  Tabs (Triamterene-Hctz) .... Take 1 Tablet By Mouth Once A Day 7)  Klor-Con M20 20 Meq  Tbcr (Potassium Chloride Crys Cr) .... Take 1 Tablet By Mouth Once A Day 8)  Crestor 5 Mg  Tabs (Rosuvastatin Calcium) .... Take 1/2 Tablet By Mouth Once A Day 9)  Fish Oil Concentrate 1000 Mg  Caps (Omega-3 Fatty Acids) .... Two Times A Day 10)  Prilosec 20 Mg Cpdr (Omeprazole) .... Take 1 Tab By Mouth Two Times A Day 30 Min Before Meals... 11)  Gabapentin 300 Mg Caps (Gabapentin) .... Take 1 Cap By Mouth Two Times A Day... 12)  Tramadol Hcl 50 Mg Tabs (Tramadol Hcl) .... Take One Tablet By Mouth Three Times A Day As Needed For Pain 13)  Ambien 10 Mg Tabs (Zolpidem Tartrate) .... Take 1/2 To 1  Tablet By Mouth At Bedtime 14)  Ocuvite  Tabs (Multiple Vitamins-Minerals) .... Take 1 Tablet By Mouth Two Times A Day 15)  Tums 500 Mg  Chew (Calcium Carbonate Antacid) .... One By Mouth As Needed 16)  Nitrostat 0.4 Mg Subl (Nitroglycerin) .... Use As Needed Chest Pain 17)  Zoloft 50 Mg Tabs (Sertraline Hcl) .... Take One Daily  Allergies: 1)  ! Demerol 2)  ! Codeine 3)  ! Zocor 4)  ! Lipitor 5)  ! Requip (Ropinirole Hcl) 6)  ! Zetia (Ezetimibe)  Anticoagulation Management History:      The patient is taking warfarin and comes in today for a routine follow up visit.  Positive risk factors for bleeding include an age of 74 years or older.  The bleeding index is 'intermediate risk'.  Positive CHADS2 values include History of HTN.  Negative CHADS2 values include Age > 74 years old.  The start date was 05/13/1999.  Anticoagulation responsible provider: Clifton James MD, Cristal Deer.  INR POC: 1.5.  Cuvette Lot#: [id number].  Exp: 02/2011.    Anticoagulation Management Assessment/Plan:      The patient's current anticoagulation dose is Coumadin 5 mg tabs: take as directed by the Coumadin Clinic....  The target INR is 2 - 3.  The next INR is due 12/11/2009.  Anticoagulation instructions were given to patient.  Results were reviewed/authorized by Leota Sauers, PharmD.  She was notified by Lew Dawes, PharmD Candidate.         Prior Anticoagulation  Instructions: INR 2.1 Continue 5mg s daily except 7.5mg s on Thursdays. Recheck in 4 weeks.   Current Anticoagulation Instructions: INR 1.5  Take 6mg  (two 3mg  tablets) Friday and Saturday then continue same dose of 1 tablet daily except 1.5 tablets on Thursdays. Recheck in 2 weeks.

## 2010-12-15 NOTE — Progress Notes (Signed)
Summary: sleeping meds  Phone Note Call from Patient Call back at Home Phone [phone]   Caller: Penelope Coop nurse Call For: nadel Summary of Call: nurse calling from pt's home. says pt was given a rx for KLONOPIN 1mg  po qhs  by dr Cornelius Moras when she was d/c'd from hosp because pt stated at that time that [facility] 5mg  1 po qhs  wasn't helping her get sleep. nurse states today that pt says KLONOPIN doesn't help and wants to go back to  [facility]. nurse (or pt) called dr Cornelius Moras and was told to call SN for recs. call Efraim Kaufmann - who is at pt's home now- [phone] or speak to pt at home # above.  Initial call taken by: Tivis Ringer, CNA,  July 09, 2010 12:27 PM  Follow-up for Phone Call        called and spoke with [facility] and verified this message.  Pt is currently on Klonopin 1mg  at bedtime and would like to go back to Ambien 5mg  at bedtime. Pt states she slept better on the Ambien.  Will forward message to SN to address.  Aundra Millet Reynolds LPN  July 09, 2010 12:41 PM   Additional Follow-up for Phone Call Additional follow up Details #1::        per SN---stop the klonipin and d/c refills.  change to ambien 10mg   1/2 to 1 tab by mouth at bedtime as needed for sleep.  #30 and generic is ok..  thanks Randell Loop CMA  July 09, 2010 2:07 PM     Additional Follow-up for Phone Call Additional follow up Details #2::    Called and spoke with pt.  Advised okay to d/c klonpin and take the ambien 10 mg 1/2 at bedtime as needed.  Pt verbalized understandng and states does not need rx for this at this time- already has this rx.  I called the [facility] [address] where she had klonpin filled and cancelled rx. Meds updated in EMR. Follow-up by: Vernie Murders,  July 09, 2010 2:36 PM  New/Updated Medications: AMBIEN 10 MG TABS (ZOLPIDEM TARTRATE) 1/2 at bedtime as needed

## 2010-12-15 NOTE — Medication Information (Signed)
Summary: [name]  Anticoagulant Therapy  Managed by: Leota Sauers, PharmD Referring MD: Tonny Bollman PCP: Lalla Brothers Supervising MD: Riley Kill MD, Maisie Fus Indication 1: Atrial Fibrillation (ICD-427.31) Lab Used: [facility] Site: [facility] INR POC 2.1 INR RANGE 2 - 3  Dietary changes: yes       Details: no green vegies this week because of being think last visit  Health status changes: no    Bleeding/hemorrhagic complications: no    Recent/future hospitalizations: no    Any changes in medication regimen? yes       Details: flagyl day #7/10  Recent/future dental: no  Any missed doses?: no       Is patient compliant with meds? yes      Comments: Now on generic warfarin  Current Medications (verified): 1)  Fluticasone Propionate 50 Mcg/act Susp (Fluticasone Propionate) .Marland Kitchen.. 1-2 Sp in Each Nostril At Bedtime.Marland KitchenMarland Kitchen 2)  Coumadin 5 Mg Tabs (Warfarin Sodium) .... Take As Directed By [facility].Marland KitchenMarland Kitchen 3)  Digoxin 0.25 Mg  Tabs (Digoxin) .... Take 1 Tablet By Mouth Once A Day 4)  Metoprolol Tartrate 50 Mg  Tabs (Metoprolol Tartrate) .... Take One Tablet By Mouth Two Times A Day 5)  Quinapril Hcl 20 Mg  Tabs (Quinapril Hcl) .... Take 1 Tablet By Mouth Once A Day 6)  Triamterene-Hctz 37.5-25 Mg  Tabs (Triamterene-Hctz) .... Take 1 Tablet By Mouth Once A Day 7)  Klor-Con M20 20 Meq  Tbcr (Potassium Chloride Crys Cr) .... Take 1 Tablet By Mouth Once A Day 8)  Crestor 5 Mg  Tabs (Rosuvastatin Calcium) .... Take 1/2 Tablet By Mouth Once A Day 9)  Fish Oil Concentrate 1000 Mg  Caps (Omega-3 Fatty Acids) .... Two Times A Day 10)  Prilosec 20 Mg Cpdr (Omeprazole) .... Take 1 Tab By Mouth Two Times A Day 30 Min Before Meals... 11)  Gabapentin 300 Mg Caps (Gabapentin) .... Take 1 Cap By Mouth Two Times A Day... 12)  Tramadol Hcl 50 Mg Tabs (Tramadol Hcl) .... Take One Tablet By Mouth Three Times A Day As Needed For Pain 13)  Ambien 10 Mg Tabs (Zolpidem Tartrate) .... Take 1/2 To 1   Tablet By Mouth At Bedtime 14)  Ocuvite  Tabs (Multiple Vitamins-Minerals) .... Take 1 Tablet By Mouth Two Times A Day 15)  Tums 500 Mg Chew (Calcium Carbonate Antacid) .... One By Mouth As Needed 16)  Nitrostat 0.4 Mg Subl (Nitroglycerin) .... Use As Needed Chest Pain 17)  Zoloft 50 Mg Tabs (Sertraline Hcl) .... Take One Daily 18)  Flagyl 250 Mg Tabs (Metronidazole) .... Take 1 Tab 3 Times Daily X 10 Days  Allergies: 1)  ! Demerol 2)  ! Codeine 3)  ! Zocor 4)  ! Lipitor 5)  ! Requip (Ropinirole Hcl) 6)  ! Zetia (Ezetimibe)  Anticoagulation Management History:      The patient is taking warfarin and comes in today for a routine follow up visit.  Positive risk factors for bleeding include an age of 50 years or older.  The bleeding index is 'intermediate risk'.  Positive CHADS2 values include History of HTN.  Negative CHADS2 values include Age > 109 years old.  The start date was 05/13/1999.  Anticoagulation responsible provider: Riley Kill MD, Maisie Fus.  INR POC: 2.1.  Cuvette Lot#: [id number].  Exp: 02/2011.    Anticoagulation Management Assessment/Plan:      The patient's current anticoagulation dose is Coumadin 5 mg tabs: take as directed by the [facility]....  The target  INR is 2 - 3.  The next INR is due 12/11/2009.  Anticoagulation instructions were given to patient.  Results were reviewed/authorized by Leota Sauers, PharmD.         Prior Anticoagulation Instructions: INR 1.5  Take 6mg  (two 3mg  tablets) Friday and Saturday then continue same dose of 1 tablet daily except 1.5 tablets on Thursdays. Recheck in 2 weeks.   Current Anticoagulation Instructions: INR 2.1 eat normal green vegie diet Continue Coumadin 5mg   = 1 tab each day  except 1 and 1/2 tab = 7.5mg  on Thur

## 2010-12-15 NOTE — Progress Notes (Signed)
Summary: c/o back in pain, feet swelling  Phone Note Call from Patient Call back at Home Phone [phone]   Caller: Patient Reason for Call: Talk to Nurse Summary of Call: pt has couple of question for mc concering her health,  c/o still having pain in back, sometime feet swelling at time . Initial call taken by: Lorne Skeens,  August 19, 2010 2:30 PM  Follow-up for Phone Call        Left message for pt to call back. Julieta Gutting, RN, BSN  August 19, 2010 2:47 PM  Pt returning call , call back # [phone] Judie Grieve  August 19, 2010 2:55 PM  I spoke with the pt and she c/o back pain when she walks and sits for a long time.  The area where her back hurts is about the size of a baseball.  The pt can press on this area or use a heating pad and it helps her symptoms.  I told that pt that this is most likely muscular or nerve related.  The pt is also having some swelling in her feet and legs. The pt is watching her salt and  elevating her legs. The pt has more swelling in her right leg and this is her graft site.  The pt does have a history of varicose veins.  The pt has been monitoring her weight and it has been stable.  I recommended that the pt try some compression stockings for swelling.  The pt will call back if she has any other questions or concerns.  Follow-up by: Julieta Gutting, RN, BSN,  August 19, 2010 3:54 PM

## 2010-12-15 NOTE — Medication Information (Signed)
Summary: [facility]  Anticoagulant Therapy  Managed by: Weston Brass, PharmD Referring MD: Tonny Bollman PCP: Alroy Dust, MD Supervising MD: Tenny Craw MD, Gunnar Fusi Indication 1: Atrial Fibrillation (ICD-427.31) Lab Used: [facility] Site: [address] INR POC 3.0 INR RANGE 2 - 3  Dietary changes: no    Health status changes: no    Bleeding/hemorrhagic complications: no    Recent/future hospitalizations: no    Any changes in medication regimen? no    Recent/future dental: no  Any missed doses?: no       Is patient compliant with meds? yes       Allergies: 1)  ! Demerol 2)  ! Codeine 3)  ! Zocor 4)  ! Lipitor 5)  ! Requip (Ropinirole Hcl) 6)  ! Zetia (Ezetimibe) 7)  ! * Emycin 8)  ! Morphine  Anticoagulation Management History:      Her anticoagulation is being managed by telephone today.  Positive risk factors for bleeding include an age of 74 years or older.  The bleeding index is 'intermediate risk'.  Positive CHADS2 values include History of CHF and History of HTN.  Negative CHADS2 values include Age > 52 years old.  The start date was 05/13/1999.  Anticoagulation responsible provider: Tenny Craw MD, Gunnar Fusi.  INR POC: 3.0.  Exp: 07/2011.    Anticoagulation Management Assessment/Plan:      The patient's current anticoagulation dose is Coumadin 5 mg tabs: as directed.  The target INR is 2 - 3.  The next INR is due 08/21/2010.  Anticoagulation instructions were given to patient.  Results were reviewed/authorized by Weston Brass, PharmD.  She was notified by Weston Brass PharmD.         Prior Anticoagulation Instructions: INR 3.1 Change dose to 2.5mg s everyday. Recheck in 12 days. Orders [facility] to [facility].   Current Anticoagulation Instructions: INR 3.0  LMOM for pt. Weston Brass PharmD  August 07, 2010 3:41 PM   [facility] for pt to continue 2.5mg  daily over weekend and would f/u on Monday. Weston Brass PharmD  August 07, 2010 4:34 PM   Spoke with pt.  Continue same dose of  1/2 tablet every day.  Recheck INR in 2 weeks.

## 2010-12-15 NOTE — Miscellaneous (Signed)
Summary: [facility] Physician Order/Treatment Plan   [facility] Physician Order/Treatment Plan   Imported By: Roderic Ovens 06/24/2010 12:36:06  _____________________________________________________________________  External Attachment:    Type:   Image     Comment:   External Document

## 2010-12-15 NOTE — Miscellaneous (Signed)
Summary: [facility] Physician Order/Treatment Plan   [facility] Physician Order/Treatment Plan   Imported By: Roderic Ovens 09/29/2010 11:40:50  _____________________________________________________________________  External Attachment:    Type:   Image     Comment:   External Document

## 2010-12-15 NOTE — Progress Notes (Signed)
Summary: extended health order  Phone Note Call from Patient Call back at [phone]   Caller: [facility] Reason for Call: Talk to Nurse Summary of Call: Efraim Kaufmann with [employer] would like to get a extended order for a nurse to come out to pt house to bath every other day. Pt is still weak. Initial call taken by: Faythe Ghee,  July 21, 2010 12:34 PM  Follow-up for Phone Call        Approval given for home health aide to continue coming into the home for 5 WEEKS for ADLs. Order will be sent into our office for signature.  Follow-up by: Julieta Gutting, RN, BSN,  July 21, 2010 1:29 PM

## 2010-12-15 NOTE — Assessment & Plan Note (Signed)
Summary: 6 months/ mbw   Primary Care Provider:  Lorin Picket Maziah Smola,MD  CC:  6 month ROV & review of mult medical problems....  History of Present Illness: 76 y/o WF here for a follow up visit... she has multiple medical problems as noted below...    ~  followed by DrCooper for Cards- PAFib on coumadin, dig, metoprolol;  hx ASD repair & dilated AoRoot;  HBP controlledon meds... last seen 9/10 w/ 75mo f/u due 3/11...  ~  followed by DrDBrodie for GI- hx dysphagia & esoph strictures dilated, also hx candida esoph... also prob w/ constip part related to rectocele, she says...   ~  December 11, 2008:  f/u w/ fasting labs and medication review w/ refills for 2010... also c/o sinus infection w/ pressure, pain ("even my teeth hurt"), congestion... sl yellow drainage, no f/c/s, sl nausea w/o vomiting, tender over maxillary area... resolved w/ Omnicef.  ~  June 24, 2009:  generally stable & doing well overall... she had bilat cataract surg by DrStoneburner June-July 2010... requests written perscription for Prilosec 20mg Bid for insurance purposes... her weight is down 5# to 160# and BS at home are good per pt hx... trouble w/ constip related to recurrent rectocele & may need further furg... she was started on Zoloft for depression & improved.   ~  January 05, 2010:  she's had a good interval x for GERD symtoms, can't eat, food sticking- she already has f/u appt w/ GI for EGD & dil...  BP controlled, 2DEcho done 9/10, no change in meds... check CXR today, she will ret for fasting labs...   Current Problem List:  OBSTRUCTIVE SLEEP APNEA (ICD-327.23) - sleep study 9/06 by Breck Coons showed RDI=25/hr during REM w/ desat to 77%...  ABN CXR w/ left superior mediastinal opacity ?etiology- no change back to 2006 films= benign...  ~  CXR 2/11 showed cardiomeg, ectatic Ao, left superior mediast soft tissue prom w/o change (?ectasia of great vessels or benign mass).  HYPERTENSION (ICD-401.9) - controlled on  METOPROLOL 50mg Bid, QUINAPRIL 20mg /d, DYAZIDE daily & KCL 29mEq/d ...  BP=120/78 and she denies HA, fatigue, visual changes, CP, palipit, dizziness, syncope, dyspnea, edema, etc... she's [place] the [place]"...  PAROXYSMAL ATRIAL FIBRILLATION (ICD-427.31) - on DIGOXIN 0.25mg /d & Coumadin- followed in the coumadin clinic.  Hx of ATRIAL SEPTAL DEFECT (ICD-745.5) - s/p ASD secundum repaired at [facility] in 1982... long-time pt of DrJoeLeBauer and now DrCooper- hx atyp CP w/ eval in 2009>  ~  Cardiolite 5/06 showed no ischemia & EF=65%...   ~  2DEcho 12/07 showed norm LVF, EF=55%, no regional wall motion abn, & RA/RV= mod dil...  ~  Cardiac MRI 3/09 showed mod Ao root enlargement 4.3cm, norm AoV/ Arch/ Desc Ao, mod RV & biatrial enlargement from the prev ASD repair (no resid leak), mod PA enlargement, norm LV w/ EF=59%...  ~  [facility] 9/09 w/ MI ruled-out, atyp CP (prob esoph spasm) & Myoview was neg- no scar or ischemia, EF= 77%...  ~  2DEcho 9/10 showed sl incr LV wall thikness c/w mild LVH, norm wall motion, EF= 55-60%, gr2 DD, mild AI, mod dil LA/RA, mod-severe TR, mild pulmHTN...  VENOUS INSUFFICIENCY (ICD-459.81) - she has chr VI changes, superficial VV, and intermittent edema...   ~  3/09 ABI's done and were WNL...  HYPERCHOLESTEROLEMIA (ICD-272.0) - on CRESTOR 5mg /d & Fish Oil 1000mg Bid... she stopped the ZETIA (she said this caused a cough that resolved when she stopped the Zetia- still on her  ACE)...  ~  FLP 7/08 showing TChol 143, TG 173, HDL 32, LDL 76.  ~  FLP 4/09 showed TChol 172, Tg 161, HDL 38, LDL 102  ~  FLP 7/09 showed TChol 167. Tg 148, HDL 40, LDL 98... rec- continue same.  ~  FLP 1/10 showed TChol 177, TG 85, HDL 43, LDL 117  ~  FLP 2/11 showed   DIABETES MELLITUS, BORDERLINE (ICD-790.29) - on diet Rx alone...  ~  Hx borderline BS w/ FBS=119 in 7/08 & HgA1c=6.1 on diet alone.  ~  labs 4/09 showed BS= 120, HgA1c= 6.0.Marland KitchenMarland Kitchen continue diet rx.  ~  labs 7/09 showed BS= 132  ~   labs 1/10 showed BS= 120, A1c= 6.1  ~  labs 2/11 showed   Hx of ESOPHAGEAL STRICTURE (ICD-530.3) - last EGD 6/04 by DrDBrodie w/ esophagitis, hx gastritis... 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 treated w/ PRILOSEC 20mg Bid...  ~  [facility] Sep09 w/ atyp CP felt to be esoph spasm... GI f/u DrDBrodie w/ EGD Sep09= candida esoph, nonobstructing esoph stricture dilated.  ~  2/11: presents w/ incr dysphagia & f/u GI pending>>  IRRITABLE BOWEL SYNDROME, HX OF (ICD-V12.79) - colonoscopy was 6/04 by DrDBrodie= neg without polyps etc... f/u 9/09 was noegative- no recurrent polyps... f/u planned 35yrs.  ~  she reports trouble w/ constip and hx recurrent rectocele- initial surg 1987, now sees DrGottsegen for GYN & may need additional surg...  DEGENERATIVE JOINT DISEASE (ICD-715.90)  BACK PAIN, LUMBAR (ICD-724.2)  FIBROMYALGIA (ICD-729.1)  PERIPHERAL NEUROPATHY (ICD-356.9) - sl worse per pt and DrLove follows... she takes NEURONTIN 300mg Bid and refuses other meds...  ?TIA - hosp 11/07 by Autumn Patty w/ ?TIA vs sleep paralysis episode... MRIBrain w/ sm vessel dis and atherosclerotic changes... she still complains of "weak spells, my whole body gets weak in the afternoons"... these episodes last  ~32min, resolve spont w/ rest or ?better after eating... she is convinced that the sleep paralysis episodes were caused by the Requip med...  ANXIETY DEPRESSION (ICD-300.4) - she states that "my nerves are shot" w/ stress from son's alcoholism & divorce, plus her husb's illness etc... started ZOLOFT 50mg /d 7/10 & improved...  Health Maintenance:  ~  GYN= DrGottsegen w/ Mammograms at [facility] (last 4/10 w/ ultrasound f/u)... Gyn does BMD's and she reports it was normal several yrs ago, not on calcium, MVI, etc...  ~  Immunizations:  she reports 2010 Flu vaccine & Pneumonia shot in 2010... cna't recall last Tetanus.   Allergies: 1)  ! Demerol 2)  ! Codeine 3)   ! Zocor 4)  ! Lipitor 5)  ! Requip (Ropinirole Hcl) 6)  ! Zetia (Ezetimibe) 7)  ! * Emycin  Comments:  Nurse/Medical Assistant: The patient's medications and allergies were reviewed with the patient and were updated in the Medication and Allergy Lists.  Past History:  Past Medical History:  PAROXYSMAL ATRIAL FIBRILLATION (ICD-427.31) TRANSIENT ISCHEMIC ATTACKS, HX OF (ICD-V12.50) HYPERTENSION (ICD-401.9) HYPERCHOLESTEROLEMIA (ICD-272.0) VENOUS INSUFFICIENCY (ICD-459.81) PERIPHERAL NEUROPATHY (ICD-356.9) ACUTE MAXILLARY SINUSITIS (ICD-461.0) OBSTRUCTIVE SLEEP APNEA (ICD-327.23) Hx of ATRIAL SEPTAL DEFECT (ICD-745.5) DIABETES MELLITUS, BORDERLINE (ICD-790.29) GERD (ICD-530.81) DYSPHAGIA (ICD-787.29) Hx of ESOPHAGEAL STRICTURE (ICD-530.3) IRRITABLE BOWEL SYNDROME, HX OF (ICD-V12.79) Family Hx of COLON CANCER (ICD-153.9) DEGENERATIVE JOINT DISEASE (ICD-715.90) BACK PAIN, LUMBAR (ICD-724.2) FIBROMYALGIA (ICD-729.1) ANXIETY DEPRESSION (ICD-300.4)  Past Surgical History: S/P secundum ASD repair at [facility] in 1982 S/P Vag Hysterectomy in 1987 w/ cystocele and rectocele repair, & ovarian surg in  1993 S/P Rt rotator cuff surgery in 2007 DC Cardioversion x 3 Tubal Ligation Appendectomy  Family History: Reviewed history from 07/19/2009 and no changes required. mother died age 67 --hx of als and dm father died age 6 from car accident 6 siblings 1 sister died age 13  hx of melanoma lungs/clot post-op 1 sister died age 17 pancreatic cancer 1 brother died age 24 from heart attack 1 brother died from heart problems and lung cancer 1 brother died from heart problems 1 brother alive age 63--hx of heart problems and new dx lung cancer 06-2008  Social History: Reviewed history from 07/19/2009 and no changes required. 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  Alcohol Use - no Illicit Drug Use - no Daily Caffeine Use Patient gets regular exercise.  Review of  Systems      See HPI       The patient complains of dyspnea on exertion.  The patient denies anorexia, fever, weight loss, weight gain, vision loss, decreased hearing, hoarseness, chest pain, syncope, peripheral edema, prolonged cough, headaches, hemoptysis, abdominal pain, melena, hematochezia, severe indigestion/heartburn, hematuria, incontinence, muscle weakness, suspicious skin lesions, transient blindness, difficulty walking, depression, unusual weight change, abnormal bleeding, enlarged lymph nodes, and angioedema.         She notes dyspagia & constipation as noted...  Vital Signs:  Patient profile:   76 year old female Height:      67 inches Weight:      151 pounds O2 Sat:      94 % on Room air Temp:     97.9 degrees F oral Pulse rate:   64 / minute BP sitting:   120 / 78  (left arm) Cuff size:   regular  Vitals Entered By: Randell Loop CMA (January 05, 2010 2:06 PM)  O2 Sat at Rest %:  94 O2 Flow:  Room air CC: 6 month ROV & review of mult medical problems... Comments NO CHANGES IN MEDS TODAY   Physical Exam  Additional Exam:  WD, WN, 76 y/o WF in NAD... GENERAL:  Alert & oriented; pleasant & cooperative... HEENT:  [place]/AT, EOM-wnl, PERRLA, Fundi-benign, EACs-clear, TMs-wnl, NOSE-clear, THROAT-clear & wnl... NECK:  Supple w/ fairROM; no JVD; normal carotid impulses w/o bruits; no thyromegaly or nodules palpated; no lymphadenopathy. CHEST:  Clear to P & A; without wheezes/ rales/ or rhonchi...  HEART:  Regular Rhythm;  gr 1/6 sys murmur, without rubs or gallops detected... s/p ASD repair. ABDOMEN:  Soft & nontender; normal bowel sounds; no organomegaly or masses detected. EXT: without deformities, mild arthritic changes; no varicose veins/ +venous insuffic/ 1+ edema. NEURO:  CN's intact;  peripheral neuropathy without focal neuro deficits on exam... DERM:  No lesions noted; no rash etc...    CXR  Procedure date:  01/05/2010  Findings:      CHEST - 2  VIEW Comparison: Prior studies dating back to 07/09/2005   Findings: Moderate cardiomegaly and ectasia of the thoracic aorta stable.  Left superior mediastinal soft tissue prominence is also unchanged and consistent with ectasia of the great vessels or a benign mass.   Both lungs are clear.  No evidence of pleural effusion.  Previous median sternotomy noted.   IMPRESSION: Stable cardiomegaly and benign left superior mediastinal mass.  No active disease.   Read By:  Danae Orleans,  M.D.   MISC. Report  Procedure date:  01/05/2010  Findings:      She will return for FASTING blood work this week &  we will notify her of the results...  SN   Impression & Recommendations:  Problem # 1:  HYPERTENSION (ICD-401.9) Controlled-  same meds. Her updated medication list for this problem includes:    Metoprolol Tartrate 50 Mg Tabs (Metoprolol tartrate) .Marland Kitchen... Take one tablet by mouth two times a day    Quinapril Hcl 20 Mg Tabs (Quinapril hcl) .Marland Kitchen... Take 1 tablet by mouth once a day    Triamterene-hctz 37.5-25 Mg Tabs (Triamterene-hctz) .Marland Kitchen... Take 1 tablet by mouth once a day  Orders: T-2 View CXR ([id number])  Problem # 2:  Hx of ATRIAL SEPTAL DEFECT (ICD-745.5) She has PAF & hx ASD repair w/ dil Ao root > all followed by DrCooper...  Problem # 3:  HYPERCHOLESTEROLEMIA (ICD-272.0) She will ret for f/u FLP>>  Her updated medication list for this problem includes:    Crestor 5 Mg Tabs (Rosuvastatin calcium) .Marland Kitchen... Take 1/2 tablet by mouth once a day  Problem # 4:  DIABETES MELLITUS, BORDERLINE (ICD-790.29) Controlled on diet alone...  Problem # 5:  DYSPHAGIA (ICD-787.29) Recurrent dysphagia w/ hx recurrent stricture> eval & EGD/ dil pending by DrDBrodie...  Problem # 6:  IRRITABLE BOWEL SYNDROME, HX OF (ICD-V12.79) She has mod constip made worse by rectocele...  Problem # 7:  ANXIETY DEPRESSION (ICD-300.4) Continue Zoloft Rx...   Problem # 8:  OTHER MEDICAL PROBLEMS AS  NOTED>>>  Complete Medication List: 1)  Fluticasone Propionate 50 Mcg/act Susp (Fluticasone propionate) .Marland Kitchen.. 1-2 sp in each nostril at bedtime.Marland KitchenMarland Kitchen 2)  Coumadin 5 Mg Tabs (Warfarin sodium) .... Take as directed by the coumadin clinic.Marland KitchenMarland Kitchen 3)  Nitrostat 0.4 Mg Subl (Nitroglycerin) .... Use as needed chest pain 4)  Digoxin 0.25 Mg Tabs (Digoxin) .... Take 1 tablet by mouth once a day 5)  Metoprolol Tartrate 50 Mg Tabs (Metoprolol tartrate) .... Take one tablet by mouth two times a day 6)  Quinapril Hcl 20 Mg Tabs (Quinapril hcl) .... Take 1 tablet by mouth once a day 7)  Triamterene-hctz 37.5-25 Mg Tabs (Triamterene-hctz) .... Take 1 tablet by mouth once a day 8)  Klor-con M20 20 Meq Tbcr (Potassium chloride crys cr) .... Take 1 tablet by mouth once a day 9)  Crestor 5 Mg Tabs (Rosuvastatin calcium) .... Take 1/2 tablet by mouth once a day 10)  Fish Oil Concentrate 1000 Mg Caps (Omega-3 fatty acids) .... Two times a day 11)  Prilosec 20 Mg Cpdr (Omeprazole) .... Take 1 tab by mouth two times a day 30 min before meals... 12)  Gabapentin 300 Mg Caps (Gabapentin) .... Take 1 cap by mouth two times a day... 13)  Tramadol Hcl 50 Mg Tabs (Tramadol hcl) .... Take one tablet by mouth three times a day as needed for pain 14)  Zoloft 50 Mg Tabs (Sertraline hcl) .... Take one daily 15)  Ambien 10 Mg Tabs (Zolpidem tartrate) .... Take 1/2 to 1  tablet by mouth at bedtime 16)  Tums 500 Mg Chew (Calcium carbonate antacid) .... One by mouth as needed 17)  Ocuvite Tabs (Multiple vitamins-minerals) .... Take 1 tablet by mouth two times a day  Patient Instructions: 1)  Today we updated your med list- see below.... 2)  Continue your current meds the same... 3)  Today we did a follow up CXR.Marland KitchenMarland Kitchen 4)  Please return to our lab one morning this week for your fasting blood work... .then please call the "phone tree" in a few days for your lab results.Marland KitchenMarland Kitchen  5)  Call for any questions.Marland KitchenMarland Kitchen 6)  Please schedule a follow-up  appointment in 6 months. Prescriptions: COUMADIN 5 MG TABS (WARFARIN SODIUM) take as directed by the Coumadin Clinic...  #[phone] x prn   Entered and Authorized by:   Michele Mcalpine MD   Signed by:   Michele Mcalpine MD on 01/05/2010   Method used:   Print then Give to Patient   RxID:   [id number]

## 2010-12-15 NOTE — Assessment & Plan Note (Signed)
Summary: PROCEDURE F/U.Marland KitchenMarland KitchenAS.   History of Present Illness Visit Type: Follow-up Visit Primary GI MD: Lina Sar MD Primary Provider: Alroy Dust, MD Requesting Provider: n/a Chief Complaint: F/u from endo , Pt states she feels better but still has alot of gas and burping. History of Present Illness:   This is a 76 white female with esophageal dysmotility and recurrent candida esophagitis. She also had hepatomegaly on an upper abdominal ultrasound in August 2009. Her spleen was shown to be at the upper limits of normal at 12 cm. Her liver function tests have been normal. She has never drank excess alcohol and has no family history of liver disease. Her dysphagia has improved on Nystatin oral suspension. Her last colonoscopy was in 2009.   GI Review of Systems    Reports belching and  bloating.      Denies abdominal pain, acid reflux, chest pain, dysphagia with liquids, dysphagia with solids, heartburn, loss of appetite, nausea, vomiting, vomiting blood, weight loss, and  weight gain.        Denies anal fissure, black tarry stools, change in bowel habit, constipation, diarrhea, diverticulosis, fecal incontinence, heme positive stool, hemorrhoids, irritable bowel syndrome, jaundice, light color stool, liver problems, rectal bleeding, and  rectal pain.    Current Medications (verified): 1)  Fluticasone Propionate 50 Mcg/act Susp (Fluticasone Propionate) .Marland Kitchen.. 1-2 Sp in Each Nostril At Bedtime.Marland KitchenMarland Kitchen 2)  Coumadin 5 Mg Tabs (Warfarin Sodium) .... Take As Directed By The Coumadin Clinic.Marland KitchenMarland Kitchen 3)  Nitrostat 0.4 Mg Subl (Nitroglycerin) .... Use As Needed Chest Pain 4)  Digoxin 0.25 Mg  Tabs (Digoxin) .... Take 1 Tablet By Mouth Once A Day 5)  Metoprolol Tartrate 50 Mg  Tabs (Metoprolol Tartrate) .... Take One Tablet By Mouth Two Times A Day 6)  Quinapril Hcl 20 Mg  Tabs (Quinapril Hcl) .... Take 1 Tablet By Mouth Once A Day 7)  Triamterene-Hctz 37.5-25 Mg  Tabs (Triamterene-Hctz) .... Take 1 Tablet By  Mouth Once A Day 8)  Klor-Con M20 20 Meq  Tbcr (Potassium Chloride Crys Cr) .... Take 1 Tablet By Mouth Once A Day 9)  Crestor 10 Mg Tabs (Rosuvastatin Calcium) .... Take One-Half  Tablet By Mouth Daily. 10)  Fish Oil Concentrate 1000 Mg  Caps (Omega-3 Fatty Acids) .... Two Times A Day 11)  Gabapentin 300 Mg Caps (Gabapentin) .... Take 1 Cap By Mouth Two Times A Day... 12)  Tramadol Hcl 50 Mg Tabs (Tramadol Hcl) .... Take One Tablet By Mouth Three Times A Day As Needed For Pain 13)  Zoloft 50 Mg Tabs (Sertraline Hcl) .... Take One Daily 14)  Ambien 10 Mg Tabs (Zolpidem Tartrate) .... Take 1/2 To 1  Tablet By Mouth At Bedtime 15)  Tums 500 Mg Chew (Calcium Carbonate Antacid) .... One By Mouth As Needed 16)  Ocuvite  Tabs (Multiple Vitamins-Minerals) .... Take 1 Tablet By Mouth Two Times A Day 17)  Stool Softener 100 Mg Caps (Docusate Sodium) .... One Capsule By Mouth At Bedtime 18)  Benefiber  Powd (Wheat Dextrin) .... As Needed 19)  Anusol-Hc 25 Mg Supp (Hydrocortisone Acetate) .... Insert 1 Suppository Into Rectum At Bedtime 20)  Nystatin 100000 Unit/ml Susp (Nystatin) .... Swish and Swallow 5 Cc By Mouth Three Times A Day 21)  Prilosec 20 Mg Cpdr (Omeprazole) .... Take 1 Capsule By Mouth Two Times A Day 22)  Vitamin D 1000 Unit  Tabs (Cholecalciferol) .... Take 1 Tablet By Mouth Once A Day 23)  Diflucan 100 Mg  Tabs (  Fluconazole) .... One By Mouth Daily For 7 Days 24)  Nitrostat 0.4 Mg Subl (Nitroglycerin) .... Take One Sublingual For Esophageal Spasms Prn  Allergies (verified): 1)  ! Demerol 2)  ! Codeine 3)  ! Zocor 4)  ! Lipitor 5)  ! Requip (Ropinirole Hcl) 6)  ! Zetia (Ezetimibe) 7)  ! Pollyann Samples  Past History:  Past Medical History: Last updated: 09-Jan-2010 PAROXYSMAL ATRIAL FIBRILLATION (ICD-427.31) TRANSIENT ISCHEMIC ATTACKS, HX OF (ICD-V12.50) HYPERTENSION (ICD-401.9) HYPERCHOLESTEROLEMIA (ICD-272.0) VENOUS INSUFFICIENCY (ICD-459.81) PERIPHERAL NEUROPATHY  (ICD-356.9) ACUTE MAXILLARY SINUSITIS (ICD-461.0) OBSTRUCTIVE SLEEP APNEA (ICD-327.23) Hx of ATRIAL SEPTAL DEFECT (ICD-745.5) DIABETES MELLITUS, BORDERLINE (ICD-790.29) GERD (ICD-530.81) DYSPHAGIA (ICD-787.29) Hx of ESOPHAGEAL STRICTURE (ICD-530.3) IRRITABLE BOWEL SYNDROME, HX OF (ICD-V12.79) Family Hx of COLON CANCER (ICD-153.9) DEGENERATIVE JOINT DISEASE (ICD-715.90) BACK PAIN, LUMBAR (ICD-724.2) FIBROMYALGIA (ICD-729.1) ANXIETY DEPRESSION (ICD-300.4)  Past Surgical History: Last updated: 01/05/2010 S/P secundum ASD repair at [facility] in 1982 S/P Vag Hysterectomy in 1987 w/ cystocele and rectocele repair, & ovarian surg in 1993 S/P Rt rotator cuff surgery in 2007 DC Cardioversion x 3 Tubal Ligation Appendectomy  Family History: Last updated: 2010-01-09 mother died age 20 --hx of als and dm father died age 48 from car accident 6 siblings 1 sister died age 53  hx of melanoma lungs/clot post-op 1 sister died age 62 pancreatic cancer 1 brother died age 41 from heart attack 1 brother died from heart problems and lung cancer 1 brother died from heart problems 1 brother alive age 74--hx of heart problems and new dx lung cancer 06-2008 No FH of Colon Cancer:  Social History: Last updated: 07/19/2009 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  Alcohol Use - no Illicit Drug Use - no Daily Caffeine Use Patient gets regular exercise.  Review of Systems  The patient denies allergy/sinus, anemia, anxiety-new, arthritis/joint pain, back pain, blood in urine, breast changes/lumps, change in vision, confusion, cough, coughing up blood, depression-new, fainting, fatigue, fever, headaches-new, hearing problems, heart murmur, heart rhythm changes, itching, menstrual pain, muscle pains/cramps, night sweats, nosebleeds, pregnancy symptoms, shortness of breath, skin rash, sleeping problems, sore throat, swelling of feet/legs, swollen lymph glands, thirst - excessive , urination -  excessive , urination changes/pain, urine leakage, vision changes, and voice change.         Pertinent positive and negative review of systems were noted in the above HPI. All other ROS was otherwise negative.   Vital Signs:  Patient profile:   76 year old female Height:      67 inches Weight:      148 pounds BMI:     23.26 BSA:     1.78 Pulse rate:   64 / minute Pulse rhythm:   regular BP sitting:   130 / 80  (left arm)  Vitals Entered By: [name] CMA Duncan Dull) (March 09, 2010 3:34 PM)  Physical Exam  General:  Well developed, well nourished, no acute distress. Eyes:  PERRLA, no icterus. Mouth:  whitish coat on the tongue. Neck:  Supple; no masses or thyromegaly. Chest Wall:  post thoracotomy scar. Lungs:  Clear throughout to auscultation. Heart:  Regular rate and rhythm; no murmurs, rubs,  or bruits. Abdomen:  soft abdomen with the right lobe of the liver 5 cm below right costal margin. Splenic tip not palpable. No ascites. Rectal:  deferred. Extremities:  No clubbing, cyanosis, edema or deformities noted. Skin:  no stigmata of chronic liver disease Psych:  Alert and cooperative. Normal mood and affect.   Impression &  Recommendations:  Problem # 1:  HEPATOMEGALY (ICD-789.1) We will repeat an upper abdominal ultrasound to follow hepatomegaly and splenomegaly. We will also recheck her liver function tests.  Problem # 2:  PERSONAL HX COLONIC POLYPS (ICD-V12.72) Patient is up-to-date on her colonoscopy. Her last exam was in 2009 and her next exam will be due in 2014.  Problem # 3:  CANDIDIASIS, ORAL (ICD-112.0) Patient will need a refill on nystatin oral suspension 5 cc p.o. t.i.d.  Other Orders: TLB-Hepatic/Liver Function Pnl ([id number])  Patient Instructions: 1)  nystatin oral suspension 5 cc p.o. t.i.d. for 3 days then repeat monthly. 2)  Liver function tests today. 3)  Upper abdominal ultrasound. 4)  Copy sent to : Alroy Dust, MD 5)  The medication list  was reviewed and reconciled.  All changed / newly prescribed medications were explained.  A complete medication list was provided to the patient / caregiver. Prescriptions: NYSTATIN 100000 UNIT/ML SUSP (NYSTATIN) Swish and swallow 5 cc by mouth three times a day x 3 days. Then repeat monthly  #8 ounces x 2   Entered by:   [name] CMA (AAMA)   Authorized by:   Hart Carwin MD   Signed by:   [name] CMA (AAMA) on 03/09/2010   Method used:   Electronically to        [facility] [address] (retail)       [address]       [place], [place]  [address]       Ph: [phone]       Fax: [phone]   RxID:   [id number]   Appended Document: Orders Update    Clinical Lists Changes  Orders: Added new Test order of Ultrasound Abdomen (UAS) - Signed

## 2010-12-15 NOTE — Medication Information (Signed)
Summary: rov coumadin - [facility]  Anticoagulant Therapy  Managed by: Charolotte Eke, PharmD Referring MD: Tonny Bollman PCP: Lalla Brothers Supervising MD: Graciela Husbands MD, Viviann Spare Indication 1: Atrial Fibrillation (ICD-427.31) Lab Used: [facility] Site: [facility] INR POC 2.1 INR RANGE 2 - 3  Dietary changes: no    Health status changes: no    Bleeding/hemorrhagic complications: no    Recent/future hospitalizations: no    Any changes in medication regimen? yes       Details: Finished Flagyl 1/26.  Recent/future dental: no  Any missed doses?: no       Is patient compliant with meds? yes      Comments: Pt. wants to increase green veggie diet.  Allergies: 1)  ! Demerol 2)  ! Codeine 3)  ! Zocor 4)  ! Lipitor 5)  ! Requip (Ropinirole Hcl) 6)  ! Zetia (Ezetimibe)  Anticoagulation Management History:      The patient is taking warfarin and comes in today for a routine follow up visit.  Positive risk factors for bleeding include an age of 74 years or older.  The bleeding index is 'intermediate risk'.  Positive CHADS2 values include History of HTN.  Negative CHADS2 values include Age > 98 years old.  The start date was 05/13/1999.  Anticoagulation responsible provider: Graciela Husbands MD, Viviann Spare.  INR POC: 2.1.  Cuvette Lot#: [id number].  Exp: 02/2011.    Anticoagulation Management Assessment/Plan:      The patient's current anticoagulation dose is Coumadin 5 mg tabs: take as directed by the [facility]....  The target INR is 2 - 3.  The next INR is due 12/25/2009.  Anticoagulation instructions were given to patient.  Results were reviewed/authorized by Charolotte Eke, PharmD.  She was notified by Lew Dawes, PharmD Candidate.         Prior Anticoagulation Instructions: INR 2.1 eat normal green vegie diet Continue Coumadin 5mg   = 1 tab each day  except 1 and 1/2 tab = 7.5mg  on Thur  Current Anticoagulation Instructions: INR 2.1  Increase dose to 5mg  daily except 7.5mg  on Mondays and  Thursdays. Increase green vegetable intake.  Recheck in 2 weeks.

## 2010-12-15 NOTE — Medication Information (Signed)
Summary: rov/tm  Anticoagulant Therapy  Managed by: Weston Brass, PharmD Referring MD: Tonny Bollman PCP: Alroy Dust, MD Supervising MD: Graciela Husbands MD, Viviann Spare Indication 1: Atrial Fibrillation (ICD-427.31) Lab Used: [facility] Site: [address] INR POC 1.7 INR RANGE 2 - 3  Dietary changes: no    Health status changes: no    Bleeding/hemorrhagic complications: yes       Details: has nosebleeds a few days this week.  Noticed some blood in mucus she coughed up this morning.  Stated blood was bright red.  Encouraged pt to see PCP to determine where blood is coming from  Recent/future hospitalizations: no    Any changes in medication regimen? no    Recent/future dental: no  Any missed doses?: no       Is patient compliant with meds? yes       Allergies: 1)  ! Demerol 2)  ! Codeine 3)  ! Zocor 4)  ! Lipitor 5)  ! Requip (Ropinirole Hcl) 6)  ! Zetia (Ezetimibe) 7)  ! * Emycin 8)  ! Morphine  Anticoagulation Management History:      The patient is taking warfarin and comes in today for a routine follow up visit.  Positive risk factors for bleeding include an age of 74 years or older.  The bleeding index is 'intermediate risk'.  Positive CHADS2 values include History of CHF and History of HTN.  Negative CHADS2 values include Age > 74 years old.  The start date was 05/13/1999.  Anticoagulation responsible provider: Graciela Husbands MD, Viviann Spare.  INR POC: 1.7.  Exp: 09/2011.    Anticoagulation Management Assessment/Plan:      The patient's current anticoagulation dose is Coumadin 5 mg tabs: as directed.  The target INR is 2 - 3.  The next INR is due 11/04/2010.  Anticoagulation instructions were given to patient.  Results were reviewed/authorized by Weston Brass, PharmD.  She was notified by Weston Brass PharmD.         Prior Anticoagulation Instructions: INR 2.1  Continue taking Coumadin 0.5 tab (2.5 mg) every day. Return to clinic in 4 weeks.   Current Anticoagulation Instructions: INR  1.7  Take 1 tablet today then resume same dose of 1/2 tablet every day.  Recheck INR in 2 weeks.

## 2010-12-15 NOTE — Assessment & Plan Note (Signed)
Summary: rov 58mos//ea   Primary Care Mackie Holness:  Alroy Dust, MD  CC:  8 month ROV & review of mult interval problems....  History of Present Illness: 76 y/o WF here for a follow up visit... she has multiple medical problems as noted below...     Followed by DrCooper for Cards- hx ASD repair 1982 & dilated AoRoot;  HBP & PAF;  then Aortic dissection w/ emergency surg 7/11 by DrOwen w/ CABG x2 and miraculous recovery...   Followed by DrDBrodie for GI- hx dysphagia & esoph strictures dilated, also hx candida esoph... also prob w/ constip part related to rectocele...   ~  September 04, 2010:  She developed sudden left sided back & chest pain 06/04/10 w/ signs of ischemia & taken to cath lab w/ AoDissection found along w/ Lmain disease- 11hr emergency operation by DrOwen w/ repair of aortic dissection & CABG x2 (SVG to LAD, & SVG to obtuse marg branch of Circ) was required to get her off the bypass machine... she made a miraculous recovery- followed closely by DrOwen & DrCooper w/ meds as below... getting stonger w/ home health rehab, etc... some recent incr trouble w/ fluid retention & they have adjusted diuretics... XRays & labs reviewed... I have offered assist in any way needed- she requests refill Rxs to [facility].   Current Problem List:  OBSTRUCTIVE SLEEP APNEA (ICD-327.23) - sleep study 9/06 by Breck Coons showed RDI=25/hr during REM w/ desat to 77%...  ABN CXR w/ left superior mediastinal opacity ?etiology- no change back to 2006 films= benign...  ~  CXR 2/11 showed cardiomeg, ectatic Ao, left superior mediast soft tissue prom w/o change (?ectasia of great vessels or benign mass).  ~  CXR 9/11 s/p median sternotomy, dissection repair,  & CABG- cardiomeg, bilat effusions & basilar atx, no ch in left superior lesion...  HYPERTENSION (ICD-401.9) - controlled on METOPROLOL 50mg Bid, FUROSEMIDE 80mg /d, & KCL 63mEq/d ...  BP=110/70 and she denies HA, visual changes, CP, palipit, syncope... she is  fatigued, SOB/ DOE, & +edema... getting PT/ rehab therapy & improving slowly...  PAROXYSMAL ATRIAL FIBRILLATION (ICD-427.31) - on COUMADIN followed in the CC, & AMIODARONE 200mg /d... followed by Drcooper for Cards.  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 long-time pt of DrJoeLeBauer and now DrCooper>  ~  Cardiolite 5/06 showed no ischemia & EF=65%...   ~  2DEcho 12/07 showed norm LVF, EF=55%, no regional wall motion abn, & RA/RV= mod dil...  ~  Cardiac MRI 3/09 showed mod Ao root enlargement 4.3cm, norm AoV/ Arch/ Desc Ao, mod RV & biatrial enlargement from the prev ASD repair (no resid leak), mod PA enlargement, norm LV w/ EF=59%...  ~  [facility] 9/09 w/ MI ruled-out, atyp CP (prob esoph spasm) & Myoview was neg- no scar or ischemia, EF= 77%...  ~  2DEcho 9/10 showed sl incr LV wall thikness c/w mild LVH, norm wall motion, EF= 55-60%, gr2 DD, mild AI, mod dil LA/RA, mod-severe TR, mild pulmHTN...  ~  Emergency hosp 7/11 w/ Aortic dissection, severe AI,  & cath w/ 40%Lmain lesion- s/p repair & required CABG x2 as well...  VENOUS INSUFFICIENCY (ICD-459.81) & EDEMA (ICD-782.3) - she has chr VI changes, superficial VV, and incr edema since surg 7/11...  ~  3/09 ABI's done and were WNL...  HYPERCHOLESTEROLEMIA (ICD-272.0) - on CRESTOR 5mg /d & Fish  Oil 1000mg Bid... she stopped the ZETIA (she said this caused a cough that resolved when she stopped the Zetia- still on her ACE)...  ~  FLP 7/08 showing TChol 143, TG 173, HDL 32, LDL 76.  ~  FLP 4/09 showed TChol 172, Tg 161, HDL 38, LDL 102  ~  FLP 7/09 showed TChol 167. Tg 148, HDL 40, LDL 98... rec- continue same.  ~  FLP 1/10 showed TChol 177, TG 85, HDL 43, LDL 117  ~  FLP 2/11 showed TChol 160, TG 83, HDL 50, LDL 94  DIABETES MELLITUS, BORDERLINE (ICD-790.29) -  on diet Rx alone...  ~  Hx borderline BS w/ FBS=119 in 7/08 & HgA1c=6.1 on diet alone.  ~  labs 4/09 showed BS= 120, HgA1c= 6.0.Marland KitchenMarland Kitchen continue diet rx.  ~  labs 7/09 showed BS= 132  ~  labs 1/10 showed BS= 120, A1c= 6.1  ~  labs 2/11 showed BS= 106  Hx of ESOPHAGEAL STRICTURE (ICD-530.3) - last EGD 6/04 by DrDBrodie w/ esophagitis, hx gastritis... 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 treated w/ PRILOSEC 20mg Bid...  ~  [facility] Sep09 w/ atyp CP felt to be esoph spasm... GI f/u DrDBrodie w/ EGD Sep09= candida esoph, nonobstructing esoph stricture dilated.  ~  2/11: presents w/ incr dysphagia & f/u GI w/ Ba Esophagram-mild stricture;  EGD 3/11 showed candida esophagitis (Rx'd);  Sonar- no gallstones, WNL.Marland KitchenMarland Kitchen  IRRITABLE BOWEL SYNDROME, HX OF (ICD-V12.79) - colonoscopy was 6/04 by DrDBrodie= neg without polyps etc... f/u 9/09 was noegative- no recurrent polyps... f/u planned 93yrs.  ~  she reports trouble w/ constip and hx recurrent rectocele- initial surg 1987, now sees DrGottsegen for GYN & may need additional surg...  DEGENERATIVE JOINT DISEASE (ICD-715.90)  BACK PAIN, LUMBAR (ICD-724.2)  FIBROMYALGIA (ICD-729.1)  PERIPHERAL NEUROPATHY (ICD-356.9) - sl worse per pt and DrLove follows... she takes NEURONTIN 300mg Bid and refuses other meds...  ?TIA - hosp 11/07 by Autumn Patty w/ ?TIA vs sleep paralysis episode... MRIBrain w/ sm vessel dis and atherosclerotic changes... she still complains of "weak spells, my whole body gets weak in the afternoons"... these episodes last  ~62min, resolve spont w/ rest or ?better after eating... she is convinced that the sleep paralysis episodes were caused by the Requip med...  ANXIETY DEPRESSION (ICD-300.4) - she states that "my nerves are shot" w/ stress from son's alcoholism & divorce, plus her husb's illness etc... started ZOLOFT 50mg /d 7/10 & improved...  Health Maintenance:  ~  GYN= DrGottsegen w/ Mammograms at  [facility] (last 4/10 w/ ultrasound f/u)... Gyn does BMD's and she reports it was normal several yrs ago, not on calcium, MVI, etc...  ~  Immunizations:  she reports 2010 Flu vaccine & Pneumonia shot in 2010... cna't recall last Tetanus.   Preventive Screening-Counseling & Management  Alcohol-Tobacco     Smoking Status: never  Caffeine-Diet-Exercise     Caffeine use/day: 1     Does Patient Exercise: yes     Type of exercise: walk     Times/week: 4  Allergies: 1)  ! Demerol 2)  ! Codeine 3)  ! Zocor 4)  ! Lipitor 5)  ! Requip (Ropinirole Hcl) 6)  ! Zetia (Ezetimibe) 7)  ! * Emycin 8)  ! Morphine  Comments:  Nurse/Medical Assistant: The patient's medications and allergies were reviewed with the patient and were updated in the Medication and Allergy Lists.  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)   Type A aortic dissection 2011 - emergency surgery Hx of ATRIAL SEPTAL DEFECT (ICD-745.5) VENOUS INSUFFICIENCY (ICD-459.81) EDEMA (ICD-782.3) HYPERCHOLESTEROLEMIA (ICD-272.0) DIABETES MELLITUS, BORDERLINE (ICD-790.29) GERD (ICD-530.81) Hx of DYSPHAGIA (ICD-787.29) Hx of ESOPHAGEAL STRICTURE (ICD-530.3) IRRITABLE BOWEL SYNDROME (ICD-564.1) PERSONAL HX COLONIC POLYPS (ICD-V12.72) Family Hx of COLON CANCER (ICD-153.9) TRANSIENT ISCHEMIC ATTACKS, HX OF (ICD-V12.50) PERIPHERAL NEUROPATHY (ICD-356.9) DEGENERATIVE JOINT DISEASE (ICD-715.90) BACK PAIN, LUMBAR (ICD-724.2) FIBROMYALGIA (ICD-729.1) ANXIETY DEPRESSION (ICD-300.4)  Past Surgical History: S/P secundum ASD repair at [facility] in 1982 S/P Vag Hysterectomy in 1987 w/ cystocele and rectocele repair, & ovarian surg in 1993 S/P Rt rotator cuff surgery in 2007 DC Cardioversion  x 3 Tubal Ligation Appendectomy Ascending Aortic dissection repair/CABG 2011  Family History: Reviewed history from 01/07/2010 and no changes required. mother died age 68 --hx of als and dm father died age 73 from car accident 6 siblings 1 sister died age 45  hx of melanoma lungs/clot post-op 1 sister died age 6 pancreatic cancer 1 brother died age 23 from heart attack 1 brother died from heart problems and lung cancer 1 brother died from heart problems 1 brother alive age 12--hx of heart problems and new dx lung cancer 06-2008 No FH of Colon Cancer:  Social History: Reviewed history from 07/19/2009 and no changes required. 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  Alcohol Use - no Illicit Drug Use - no Daily Caffeine Use Patient gets regular exercise.  Review of Systems      See HPI       The patient complains of decreased hearing, dyspnea on exertion, and peripheral edema.  The patient denies anorexia, fever, weight loss, weight gain, vision loss, hoarseness, chest pain, syncope, prolonged cough, headaches, hemoptysis, abdominal pain, melena, hematochezia, severe indigestion/heartburn, hematuria, incontinence, muscle weakness, suspicious skin lesions, transient blindness, difficulty walking, depression, unusual weight change, abnormal bleeding, enlarged lymph nodes, and angioedema.    Vital Signs:  Patient profile:   76 year old female Height:      67 inches Weight:      148.13 pounds BMI:     23.28 O2 Sat:      99 % on room air Temp:     96.9 degrees F oral Pulse rate:   58 / minute BP sitting:   110 / 70  (left arm) Cuff size:   regular  Vitals Entered By: Randell Loop CMA (September 04, 2010 10:13 AM)  O2 Sat at Rest %:  99 O2 Flow:  room air CC: 8 month ROV & review of mult interval problems... Is Patient Diabetic? No Pain Assessment Patient in pain? no      Comments meds updated today with pt   Physical Exam  Additional Exam:  WD,  Thin, 76 y/o WF in NAD... GENERAL:  Alert & oriented; pleasant & cooperative... HEENT:  [place]/AT, EOM-wnl, PERRLA, Fundi-benign, EACs-clear, TMs-wnl, NOSE-clear, THROAT-clear & wnl... NECK:  Supple w/ fairROM; no JVD; normal carotid impulses w/o bruits; no thyromegaly or nodules palpated; no lymphadenopathy. CHEST:  Median sterotomy scar, decr BS at bases w/ few bibasilar rales... HEART:  Regular Rhythm;  gr 1/6 sys murmur, without rubs or gallops detected... s/p repair ASD & Ao dissection... ABDOMEN:  Soft & nontender; normal bowel sounds; no organomegaly or masses detected. EXT: without deformities, mild arthritic changes; no varicose veins/ +venous insuffic/ 3+ edema. NEURO:  CN's  intact;  peripheral neuropathy without focal neuro deficits on exam... DERM:  No lesions noted; no rash etc...    MISC. Report  Procedure date:  09/04/2010  Findings:      DATA REVIEWED:  ~  extensive old records reviewed from 7/11...   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 she required  2 vessel CABG at the same time just to get off the bypass machine... she made a miraculous improvement & still getting better w/ rehab...  Problem # 2:  EDEMA (ICD-782.3) Diuretics increased by DrCooper for worsening leg edema... Her updated medication list for this problem includes:    Furosemide 80 Mg Tabs (Furosemide) .Marland Kitchen... 1 tab by mouth once daily  Problem # 3:  HYPERTENSION (ICD-401.9) BP controlled>  same meds... Her updated medication list for this problem includes:    Metoprolol Tartrate 50 Mg Tabs (Metoprolol tartrate) .Marland Kitchen... Take one tablet by mouth two times a day    Furosemide 80 Mg Tabs (Furosemide) .Marland Kitchen... 1 tab by mouth once daily  Problem # 4:  PAROXYSMAL ATRIAL FIBRILLATION (ICD-427.31) Stable on Amio... followed by DrCooper & CC... Her updated medication list for this problem  includes:    Coumadin 5 Mg Tabs (Warfarin sodium) .Marland Kitchen... As directed    Metoprolol Tartrate 50 Mg Tabs (Metoprolol tartrate) .Marland Kitchen... Take one tablet by mouth two times a day    Amiodarone Hcl 200 Mg Tabs (Amiodarone hcl) .Marland Kitchen... Take one tablet by mouth once a day    Aspirin 81 Mg Tbec (Aspirin) .Marland Kitchen... Take one tablet by mouth daily  Problem # 5:  HYPERCHOLESTEROLEMIA (ICD-272.0) She will need fasting labs on return... Her updated medication list for this problem includes:    Crestor 10 Mg Tabs (Rosuvastatin calcium) .Marland Kitchen... Take one-half  tablet by mouth daily.  Problem # 6:  Mult other medical problems as noted>>> Meds refilled per request...  Complete Medication List: 1)  Fluticasone Propionate 50 Mcg/act Susp (Fluticasone propionate) .Marland Kitchen.. 1-2 sp in each nostril at bedtime.Marland KitchenMarland Kitchen 2)  Coumadin 5 Mg Tabs (Warfarin sodium) .... As directed 3)  Metoprolol Tartrate 50 Mg Tabs (Metoprolol tartrate) .... Take one tablet by mouth two times a day 4)  Crestor 10 Mg Tabs (Rosuvastatin calcium) .... Take one-half  tablet by mouth daily. 5)  Gabapentin 300 Mg Caps (Gabapentin) .... Take 1 cap by mouth two times a day... 6)  Tramadol Hcl 50 Mg Tabs (Tramadol hcl) .... Take one tablet by mouth three times a day as needed for pain 7)  Zoloft 50 Mg Tabs (Sertraline hcl) .... Take one daily 8)  Tums 500 Mg Chew (Calcium carbonate antacid) .... One by mouth as needed 9)  Ocuvite Tabs (Multiple vitamins-minerals) .... Take 1 tablet by mouth two times a day 10)  Stool Softener 100 Mg Caps (Docusate sodium) .... One capsule by mouth at bedtime 11)  Benefiber Powd (Wheat dextrin) .... As needed 12)  Anusol-hc 25 Mg Supp (Hydrocortisone acetate) .... Insert 1 suppository into rectum at bedtime 13)  Prilosec 20 Mg Cpdr (Omeprazole) .... Take 1 capsule by mouth two times a day 14)  Vitamin D 1000 Unit Tabs (Cholecalciferol) .... Take 1 tablet by mouth once a day 15)  Glucosamine-chondroitin Caps (Glucosamine-chondroit-vit  c-mn) .... Take 1 capsule by mouth two times a day 16)  Amiodarone Hcl 200 Mg Tabs (Amiodarone hcl) .... Take one tablet by mouth once a day  17)  Furosemide 80 Mg Tabs (Furosemide) .Marland Kitchen.. 1 tab by mouth once daily 18)  Ambien 10 Mg Tabs (Zolpidem tartrate) .Marland Kitchen.. 1  at bedtime as needed 19)  Potassium Chloride Crys Cr 20 Meq Cr-tabs (Potassium chloride crys cr) .... Take one tablet by mouth everytime that you take furosemide 20)  Zithromax Z-pak 250 Mg Tabs (Azithromycin) .... Take as directed 21)  Aspirin 81 Mg Tbec (Aspirin) .... Take one tablet by mouth daily 22)  Clorazepate Dipotassium 7.5 Mg Tabs (Clorazepate dipotassium) .... Take 1 tab by mouth every 8 hours as needed for nerves.  Patient Instructions: 1)  Today we updated your med list- see below.... 2)  We will send refills of the meds you requested to [facility]... 3)  Congrats on your remarkable recovery.Marland KitchenMarland Kitchen 4)  Let me know if there is anything that we can do for you... 5)  Please schedule a follow-up appointment in 6 months, & we will plan FASTING blood work at that time... Prescriptions: AMBIEN 10 MG TABS (ZOLPIDEM TARTRATE) 1  at bedtime as needed  #90 x 3   Entered by:   Randell Loop CMA   Authorized by:   Michele Mcalpine MD   Signed by:   Randell Loop CMA on 09/04/2010   Method used:   Print then Mail to Patient   RxID:   [id number] POTASSIUM CHLORIDE CRYS CR 20 MEQ CR-TABS (POTASSIUM CHLORIDE CRYS CR) Take one tablet by mouth everytime that you take Furosemide  #90 x 3   Entered by:   Randell Loop CMA   Authorized by:   Michele Mcalpine MD   Signed by:   Randell Loop CMA on 09/04/2010   Method used:   Faxed to ...       [facility] (mail-order)       [address]       [place], [place]  [id number]       Ph: [phone]       Fax: [phone]   RxID:   [id number] PRILOSEC 20 MG CPDR (OMEPRAZOLE) Take 1 capsule by mouth two times a day  #180 x 2   Entered by:   Randell Loop CMA   Authorized by:    Michele Mcalpine MD   Signed by:   Randell Loop CMA on 09/04/2010   Method used:   Faxed to ...       [facility] (mail[facility]order)       [address]       [place], [place]  [id number]       Ph: [phone]       Fax: [phone]   RxID:   [id number] ZOLOFT 50 MG TABS (SERTRALINE HCL) take one daily  #90 x 3   Entered by:   Randell Loop CMA   Authorized by:   Michele Mcalpine MD   Signed by:   Randell Loop CMA on 09/04/2010   Method used:   Faxed to ...       [facility] (mail[facility]order)       [address]       [place], [place]  [id number]       Ph: [phone]       Fax: [phone]   RxID:   [id number] TRAMADOL HCL 50 MG TABS (TRAMADOL HCL) take one tablet by mouth three times a day as needed for pain  #90 x 3   Entered by:   Randell Loop CMA   Authorized by:  Michele Mcalpine MD   Signed by:   Randell Loop CMA on 09/04/2010   Method used:   Faxed to ...       [facility] (mail[facility])       [address]       [place], [place]  [id number]       Ph: [phone]       Fax: [phone]   RxID:   [id number] GABAPENTIN 300 MG CAPS (GABAPENTIN) take 1 cap by mouth two times a day...  #180 x 3   Entered by:   Randell Loop CMA   Authorized by:   Michele Mcalpine MD   Signed by:   Randell Loop CMA on 09/04/2010   Method used:   Faxed to ...       [facility] (mail[facility])       [address]       [place], [place]  [id number]       Ph: [phone]       Fax: [phone]   RxID:   [id number] CRESTOR 10 MG TABS (ROSUVASTATIN CALCIUM) Take one-half  tablet by mouth daily.  #90 x 3   Entered by:   Randell Loop CMA   Authorized by:   Michele Mcalpine MD   Signed by:   Randell Loop CMA on 09/04/2010   Method used:   Faxed to ...       [facility] (mail[facility])       [address]       [place], [place]  [id number]       Ph: [phone]       Fax: [phone]   RxID:   [id number] METOPROLOL TARTRATE 50 MG   TABS (METOPROLOL TARTRATE) take one tablet by mouth two times a day  #180 Tablet x 3   Entered by:   Randell Loop CMA   Authorized by:   Michele Mcalpine MD   Signed by:   Randell Loop CMA on 09/04/2010   Method used:   Faxed to ...       [facility] (mail[facility]order)       [address]       [place], [place]  [id number]       Ph: [phone]       Fax: [phone]   RxID:   [id number] COUMADIN 5 MG TABS (WARFARIN SODIUM) as directed  #90 x 3   Entered by:   Randell Loop CMA   Authorized by:   Michele Mcalpine MD   Signed by:   Randell Loop CMA on 09/04/2010   Method used:   Faxed to ...       [facility] (mail[facility]order)       [address]       [place], [place]  [id number]       Ph: [phone]       Fax: [phone]   RxID:   [id number]

## 2010-12-15 NOTE — Letter (Signed)
Summary: EGD Instructions  [facility]  [address] [place], [place] [address]   Phone: [phone]  Fax: [phone]       [name]    1935/04/07    MRN: [id number]       Procedure Day Dorna Bloom:  Jake Shark  01/27/10     Arrival Time: 10:00AM     Procedure Time:  11:00AM     Location of Procedure:                    _ X _ [facility] (4th Floor)    PREPARATION FOR ENDOSCOPY   On TUESDAY  01/27/10 THE DAY OF THE PROCEDURE:  1.   No solid foods, milk or milk products are allowed after midnight the night before your procedure.  2.   Do not drink anything colored red or purple.  Avoid juices with pulp.  No orange juice.  3.  You may drink clear liquids until 9:00AM, which is 2 hours before your procedure.                                                                                                CLEAR LIQUIDS INCLUDE: Water Jello Ice Popsicles Tea (sugar ok, no milk/cream) Powdered fruit flavored drinks Coffee (sugar ok, no milk/cream) Gatorade Juice: apple, white grape, white cranberry  Lemonade Clear bullion, consomm, broth Carbonated beverages (any kind) Strained chicken noodle soup Hard Candy   MEDICATION INSTRUCTIONS  Unless otherwise instructed, you should take regular prescription medications with a small sip of water as early as possible the morning of your procedure.    Stop taking Coumadin on  01/22/10  (5 days before procedure).  Additional medication instructions:  Hold your Triamterene/HCTZ the morning of your procedure.             OTHER INSTRUCTIONS  You will need a responsible adult at least 74 years of age to accompany you and drive you home.   This person must remain in the waiting room during your procedure.  Wear loose fitting clothing that is easily removed.  Leave jewelry and other valuables at home.  However, you may wish to bring a book to read or an iPod/MP3 player to listen to music as you wait  for your procedure to start.  Remove all body piercing jewelry and leave at home.  Total time from sign-in until discharge is approximately 2-3 hours.  You should go home directly after your procedure and rest.  You can resume normal activities the day after your procedure.  The day of your procedure you should not:   Drive   Make legal decisions   Operate machinery   Drink alcohol   Return to work  You will receive specific instructions about eating, activities and medications before you leave.    The above instructions have been reviewed and explained to me by  Wyona Almas RN  January 21, 2010 3:00 PM     I fully understand and can verbalize these instructions . Date _________

## 2010-12-15 NOTE — Assessment & Plan Note (Signed)
Summary: 2:00/triage call   Visit Type:  Follow-up Referring Provider:  n/a Primary Provider:  Alroy Dust, MD  CC:  irregular heart beat- and High blood pressure.  History of Present Illness: 76 year-old woman presenting for hospital follow-up evaluation after recent emergency surgery for Type A aortic dissection. She underwent aortic root repair and CABG of the LAD and LCx. She has a remote history of surgical repair of ASD 1982 and paroxysmal atrial fibrillation treated with long-term warfarin.  Since discharge home she reports frequent palpitations and 'heart pounding.' She was symptomatic with atrial fib in the hospital when her heart rates were over 100 bpm.  No chest pain, dyspnea, orthopnea, or PND. Reports difficulty sleeping when her heart rates are elevated. Also complains of mild leg swelling.  Current Medications (verified): 1)  Fluticasone Propionate 50 Mcg/act Susp (Fluticasone Propionate) .Marland Kitchen.. 1-2 Sp in Each Nostril At Bedtime.Marland KitchenMarland Kitchen 2)  Coumadin 5 Mg Tabs (Warfarin Sodium) .... 5mg  Every Other Day 2.5 Every Other Day 3)  Nitrostat 0.4 Mg Subl (Nitroglycerin) .... Use As Needed Chest Pain 4)  Metoprolol Tartrate 50 Mg  Tabs (Metoprolol Tartrate) .... Take One Tablet By Mouth Two Times A Day 5)  Crestor 10 Mg Tabs (Rosuvastatin Calcium) .... Take One-Half  Tablet By Mouth Daily. 6)  Gabapentin 300 Mg Caps (Gabapentin) .... Take 1 Cap By Mouth Two Times A Day... 7)  Tramadol Hcl 50 Mg Tabs (Tramadol Hcl) .... Take One Tablet By Mouth Three Times A Day As Needed For Pain 8)  Zoloft 50 Mg Tabs (Sertraline Hcl) .... Take One Daily 9)  Tums 500 Mg Chew (Calcium Carbonate Antacid) .... One By Mouth As Needed 10)  Ocuvite  Tabs (Multiple Vitamins-Minerals) .... Take 1 Tablet By Mouth Two Times A Day 11)  Stool Softener 100 Mg Caps (Docusate Sodium) .... One Capsule By Mouth At Bedtime 12)  Benefiber  Powd (Wheat Dextrin) .... As Needed 13)  Anusol-Hc 25 Mg Supp (Hydrocortisone  Acetate) .... Insert 1 Suppository Into Rectum At Bedtime 14)  Nystatin 100000 Unit/ml Susp (Nystatin) .... Swish and Swallow 5 Cc By Mouth Three Times A Day X 3 Days. Then Repeat Monthly 15)  Prilosec 20 Mg Cpdr (Omeprazole) .... Take 1 Capsule By Mouth Two Times A Day 16)  Vitamin D 1000 Unit  Tabs (Cholecalciferol) .... Take 1 Tablet By Mouth Once A Day 17)  Klonopin 1 Mg Tabs (Clonazepam) .... Take 1 Tab By Mouth At Bedtime 18)  Aspirin 81 Mg Tbec (Aspirin) .... Take One Tablet By Mouth Daily 19)  Glucosamine-Chondroitin  Caps (Glucosamine-Chondroit-Vit C-Mn) .... Take 1 Capsule By Mouth Two Times A Day  Allergies: 1)  ! Demerol 2)  ! Codeine 3)  ! Zocor 4)  ! Lipitor 5)  ! Requip (Ropinirole Hcl) 6)  ! Zetia (Ezetimibe) 7)  ! * Emycin 8)  ! Morphine  Past History:  Past medical history reviewed for relevance to current acute and chronic problems.  Past Medical History: Type A aortic dissection 2011 - emergency surgery PAROXYSMAL ATRIAL FIBRILLATION (ICD-427.31) TRANSIENT ISCHEMIC ATTACKS, HX OF (ICD-V12.50) HYPERTENSION (ICD-401.9) HYPERCHOLESTEROLEMIA (ICD-272.0) VENOUS INSUFFICIENCY (ICD-459.81) PERIPHERAL NEUROPATHY (ICD-356.9) ACUTE MAXILLARY SINUSITIS (ICD-461.0) OBSTRUCTIVE SLEEP APNEA (ICD-327.23) Hx of ATRIAL SEPTAL DEFECT (ICD-745.5) DIABETES MELLITUS, BORDERLINE (ICD-790.29) GERD (ICD-530.81) DYSPHAGIA (ICD-787.29) Hx of ESOPHAGEAL STRICTURE (ICD-530.3) IRRITABLE BOWEL SYNDROME, HX OF (ICD-V12.79) Family Hx of COLON CANCER (ICD-153.9) DEGENERATIVE JOINT DISEASE (ICD-715.90) BACK PAIN, LUMBAR (ICD-724.2) FIBROMYALGIA (ICD-729.1) ANXIETY DEPRESSION (ICD-300.4)  Past Surgical History: S/P secundum ASD repair at  [facility] in 1982 S/P Vag Hysterectomy in 1987 w/ cystocele and rectocele repair, & ovarian surg in 1993 S/P Rt rotator cuff surgery in 2007 DC Cardioversion x 3 Tubal Ligation Appendectomy Ascending Aortic dissection repair/CABG 2011  Review of  Systems       Negative except as per HPI   Vital Signs:  Patient profile:   76 year old female Height:      67 inches Weight:      149 pounds BMI:     23.42 Pulse rate:   69 / minute Pulse rhythm:   irregular Resp:     18 per minute BP sitting:   110 / 80  (left arm) Cuff size:   large  Vitals Entered By: Vikki Ports (July 02, 2010 2:28 PM) CC: irregular heart beat- and High blood pressure Comments INR yesterday 2.09   Physical Exam  General:  Pt is alert and oriented, in no acute distress. HEENT: normal Neck: normal carotid upstrokes without bruits, JVP normal Lungs: CTA chest: sternotomy well-healed CV: RRR without murmur or gallop Abd: soft, NT, positive BS, no bruit, no organomegaly Ext: trace bilateral pretibial edema. peripheral pulses 2+ and equal Skin: warm and dry without rash    EKG  Procedure date:  07/02/2010  Findings:      Atrial flutter with 3:1 conduction 69 bpm.  Impression & Recommendations:  Problem # 1:  PAROXYSMAL ATRIAL FIBRILLATION (ICD-427.31) Pt with history of Paroxysmal Afib, now with atrial flutter. I suspect her heart rates are intermittently high and that she is also having atrial fib. Will resume amiodarone 200 mg two times a day and ask her to continue metoprolol 50 mg two times a day. I advised she can take an extra 25 mg of metoprolol as needed if palps are particularly bad. Will reevaluate in 2 weeks with a f/u office visit and EKG. Also will check baseline labs at that point (TSH, LFT's, metabolic panel).  The following medications were removed from the medication list:    Digoxin 0.25 Mg Tabs (Digoxin) .Marland Kitchen... Take 1 tablet by mouth once a day Her updated medication list for this problem includes:    Coumadin 5 Mg Tabs (Warfarin sodium) ..... 5mg  every other day 2.5 every other day    Metoprolol Tartrate 50 Mg Tabs (Metoprolol tartrate) .Marland Kitchen... Take one tablet by mouth two times a day    Aspirin 81 Mg Tbec (Aspirin) .Marland Kitchen... Take  one tablet by mouth daily    Amiodarone Hcl 200 Mg Tabs (Amiodarone hcl) .Marland Kitchen... Take one tablet by mouth twice a day  Orders: EKG w/ Interpretation (93000) Echocardiogram (Echo)  Problem # 2:  DISSECTING AORTIC ANEURYSM THORACIC (ICD-441.01) Pt is s/p repair also requiring CABG. She has really done remarkably well considering her presentation, and the fact that this was a redo heart surgery. Will check an echocardiogram when she returns for followup.  She complains of edema and has mild edema on exam today. I prescribed lasix on an as needed basis, but asked her to use it sparingly as her oral intake is low and I worry about her risk of dehydration.  Patient Instructions: 1)  Your physician has recommended you make the following change in your medication: START Amiodarone 200mg  one tablet by mouth two times a day, START Lasix 40mg  once a day as needed for swelling  2)  Your physician has requested that you have an echocardiogram on 07/16/10.  Echocardiography is a painless test that uses sound waves to create images  of your heart. It provides your doctor with information about the size and shape of your heart and how well your heart's chambers and valves are working.  This procedure takes approximately one hour. There are no restrictions for this procedure. 3)  Your physician recommends that you keep your scheduled appointment on 07/16/10.  Prescriptions: FUROSEMIDE 40 MG TABS (FUROSEMIDE) Take one tablet by mouth daily as needed for swelling.  #30 x 1   Entered by:   Julieta Gutting, RN, BSN   Authorized by:   Norva Karvonen, MD   Signed by:   Julieta Gutting, RN, BSN on 07/02/2010   Method used:   Electronically to        [id number] (retail)       [address]       [place], [place]  [address]       Ph: [phone] or [phone]       Fax: [phone]   RxID:   [id number]

## 2010-12-15 NOTE — Letter (Signed)
Summary: [facility]  [facility]   Imported By: Marylou Mccoy 07/16/2010 10:40:06  _____________________________________________________________________  External Attachment:    Type:   Image     Comment:   External Document

## 2010-12-15 NOTE — Medication Information (Signed)
Summary: rov/eac  Anticoagulant Therapy  Managed by: Elaina Pattee, PharmD Referring MD: Tonny Bollman PCP: Alroy Dust, MD Supervising MD: Juanda Chance MD, Jimie Kuwahara Indication 1: Atrial Fibrillation (ICD-427.31) Lab Used: [facility] Site: [facility] INR POC 2.5 INR RANGE 2 - 3  Dietary changes: no    Health status changes: no    Bleeding/hemorrhagic complications: no    Recent/future hospitalizations: no    Any changes in medication regimen? no    Recent/future dental: no  Any missed doses?: no       Is patient compliant with meds? yes       Allergies: 1)  ! Demerol 2)  ! Codeine 3)  ! Zocor 4)  ! Lipitor 5)  ! Requip (Ropinirole Hcl) 6)  ! Zetia (Ezetimibe) 7)  ! * Emycin  Anticoagulation Management History:      The patient is taking warfarin and comes in today for a routine follow up visit.  Positive risk factors for bleeding include an age of 17 years or older.  The bleeding index is 'intermediate risk'.  Positive CHADS2 values include History of HTN.  Negative CHADS2 values include Age > 33 years old.  The start date was 05/13/1999.  Anticoagulation responsible provider: Juanda Chance MD, Smitty Cords.  INR POC: 2.5.  Cuvette Lot#: [id number].  Exp: 06/2011.    Anticoagulation Management Assessment/Plan:      The patient's current anticoagulation dose is Coumadin 5 mg tabs: take as directed by the [facility]....  The target INR is 2 - 3.  The next INR is due 05/20/2010.  Anticoagulation instructions were given to patient.  Results were reviewed/authorized by Elaina Pattee, PharmD.  She was notified by Elaina Pattee, PharmD.         Prior Anticoagulation Instructions: INR 3.0  Take 1 tablet today.  Then return to normal dosing schedule of 1.5 tablets (7.5 mg) on Monday, Wednesday, and Friday, and take 1 tablet (5 mg) all other days.  Return to clinic in 3 weeks.    Current Anticoagulation Instructions: INR 2.5. Take 1 tablet daily except 1.5 tablets on Mon, Wed, Fri.  Recheck  in 4 weeks.

## 2010-12-16 ENCOUNTER — Encounter (HOSPITAL_COMMUNITY): Payer: Medicare Other | Attending: Cardiovascular Disease

## 2010-12-16 DIAGNOSIS — I10 Essential (primary) hypertension: Secondary | ICD-10-CM | POA: Insufficient documentation

## 2010-12-16 DIAGNOSIS — Z951 Presence of aortocoronary bypass graft: Secondary | ICD-10-CM | POA: Insufficient documentation

## 2010-12-16 DIAGNOSIS — Z8673 Personal history of transient ischemic attack (TIA), and cerebral infarction without residual deficits: Secondary | ICD-10-CM | POA: Insufficient documentation

## 2010-12-16 DIAGNOSIS — E785 Hyperlipidemia, unspecified: Secondary | ICD-10-CM | POA: Insufficient documentation

## 2010-12-16 DIAGNOSIS — I4892 Unspecified atrial flutter: Secondary | ICD-10-CM | POA: Insufficient documentation

## 2010-12-16 DIAGNOSIS — I259 Chronic ischemic heart disease, unspecified: Secondary | ICD-10-CM | POA: Insufficient documentation

## 2010-12-16 DIAGNOSIS — I359 Nonrheumatic aortic valve disorder, unspecified: Secondary | ICD-10-CM | POA: Insufficient documentation

## 2010-12-16 DIAGNOSIS — G4733 Obstructive sleep apnea (adult) (pediatric): Secondary | ICD-10-CM | POA: Insufficient documentation

## 2010-12-16 DIAGNOSIS — Z7901 Long term (current) use of anticoagulants: Secondary | ICD-10-CM | POA: Insufficient documentation

## 2010-12-16 DIAGNOSIS — K219 Gastro-esophageal reflux disease without esophagitis: Secondary | ICD-10-CM | POA: Insufficient documentation

## 2010-12-16 DIAGNOSIS — Z5189 Encounter for other specified aftercare: Secondary | ICD-10-CM | POA: Insufficient documentation

## 2010-12-16 DIAGNOSIS — Z9889 Other specified postprocedural states: Secondary | ICD-10-CM | POA: Insufficient documentation

## 2010-12-16 DIAGNOSIS — I4891 Unspecified atrial fibrillation: Secondary | ICD-10-CM | POA: Insufficient documentation

## 2010-12-16 DIAGNOSIS — I2510 Atherosclerotic heart disease of native coronary artery without angina pectoris: Secondary | ICD-10-CM | POA: Insufficient documentation

## 2010-12-17 NOTE — Progress Notes (Signed)
Summary: Pt requires SBE  Phone Note Call from Patient   Caller: Patient [phone] or [phone] before 1230 if poss Reason for Call: Talk to Nurse Complaint: Chest Pain Summary of Call: hasn't has a cleaning since surgery-would she need any med prior to a dental cleaning Initial call taken by: Glynda Jaeger,  November 23, 2010 10:50 AM  Follow-up for Phone Call        I spoke with the pt and she needs her potassium Rx sent to [facility]. The pt also asked if she needs SBE since her surgery.  This pt underwent CABG and emergency aneurysm repair.   Follow-up by: Julieta Gutting, RN, BSN,  November 23, 2010 12:00 PM  Additional Follow-up for Phone Call Additional follow up Details #1::        I discussed this pt with Dr Excell Seltzer and due to the graft used for aneurysm repair he recommends life-long SBE. [facility] on [address]. Allergy to Emycin. Julieta Gutting, RN, BSN  November 23, 2010 6:26 PM  I spoke with Weston Brass Pharm-D and the pt can take Amoxicillin 500mg  take 4 capsules by mouth one hour prior to procedure. Rx sent to the pt's pharmacy. Pt aware.  Additional Follow-up by: Julieta Gutting, RN, BSN,  November 24, 2010 9:14 AM    New/Updated Medications: POTASSIUM CHLORIDE CRYS CR 20 MEQ CR-TABS (POTASSIUM CHLORIDE CRYS CR) take two tablets by mouth once a day except on Wednesday take three tablets daily AMOXICILLIN 500 MG CAPS (AMOXICILLIN) take 4 capsules by mouth one hour prior to procedure Prescriptions: AMOXICILLIN 500 MG CAPS (AMOXICILLIN) take 4 capsules by mouth one hour prior to procedure  #8 x 2   Entered by:   Julieta Gutting, RN, BSN   Authorized by:   Norva Karvonen, MD   Signed by:   Julieta Gutting, RN, BSN on 11/24/2010   Method used:   Electronically to        [facility] (retail)       [address]       [place], [place]  [address]       Ph: [phone]       Fax: [phone]   RxID:   [id number] POTASSIUM CHLORIDE CRYS CR 20 MEQ  CR-TABS (POTASSIUM CHLORIDE CRYS CR) take two tablets by mouth once a day except on Wednesday take three tablets daily  #180 x 3   Entered by:   Julieta Gutting, RN, BSN   Authorized by:   Norva Karvonen, MD   Signed by:   Julieta Gutting, RN, BSN on 11/23/2010   Method used:   Printed then faxed to ...       [facility] (mail-order)       [address]       [place], [place]  [id number]       Ph: [phone]       Fax: [phone]   RxID:   [id number]

## 2010-12-17 NOTE — Medication Information (Signed)
Summary: rov/sp  Anticoagulant Therapy  Managed by: Bethanne Ginger, PharmD Referring MD: Tonny Bollman PCP: Alroy Dust, MD Supervising MD: Antoine Poche MD, Fayrene Fearing Indication 1: Atrial Fibrillation (ICD-427.31) Lab Used: [facility] [place] Site: [address] INR POC 1.8 INR RANGE 2 - 3  Dietary changes: no    Health status changes: no    Bleeding/hemorrhagic complications: no    Recent/future hospitalizations: no    Any changes in medication regimen? no    Recent/future dental: no  Any missed doses?: no       Is patient compliant with meds? yes       Allergies: 1)  ! Demerol 2)  ! Codeine 3)  ! Zocor 4)  ! Lipitor 5)  ! Requip (Ropinirole Hcl) 6)  ! Zetia (Ezetimibe) 7)  ! * Emycin 8)  ! Morphine  Anticoagulation Management History:      The patient is taking warfarin and comes in today for a routine follow up visit.  Positive risk factors for bleeding include an age of 74 years or older.  The bleeding index is 'intermediate risk'.  Positive CHADS2 values include History of CHF and History of HTN.  Negative CHADS2 values include Age > 74 years old.  The start date was 05/13/1999.  Anticoagulation responsible provider: Antoine Poche MD, Fayrene Fearing.  INR POC: 1.8.  Cuvette Lot#: [id number].  Exp: 12/2011.    Anticoagulation Management Assessment/Plan:      The patient's current anticoagulation dose is Coumadin 5 mg tabs: as directed.  The target INR is 2 - 3.  The next INR is due 12/11/2010.  Anticoagulation instructions were given to patient.  Results were reviewed/authorized by Bethanne Ginger, PharmD.  She was notified by Stephannie Peters, PharmD Candidate .         Prior Anticoagulation Instructions: INR 1.7  Increase dose to 1 tablet every day except 1/2 tablet on Tuesday, Thursday and Saturday.  Recheck INR in 2 weeks.   Current Anticoagulation Instructions: INR 1.8  Coumadin 5 mg tablets - Take 1.5 tablets today, then take 1 tablet every day except 1/2 tablet  on Tuesdays

## 2010-12-17 NOTE — Medication Information (Signed)
Summary: rov/sp  Anticoagulant Therapy  Managed by: Bethena Midget, RN, BSN Referring MD: Tonny Bollman PCP: Alroy Dust, MD Supervising MD: Tenny Craw MD, Gunnar Fusi Indication 1: Atrial Fibrillation (ICD-427.31) Lab Used: [facility] [place] Site: [address] INR POC 2.0 INR RANGE 2 - 3  Dietary changes: no    Health status changes: no    Bleeding/hemorrhagic complications: yes       Details: On Wed had nosebleed had to hold pressure for it to stop scan amt in left nare  Recent/future hospitalizations: no    Any changes in medication regimen? yes       Details: Took 2000mg s of Amoxicillin on Monday prior to Dental.   Recent/future dental: yes     Details: Dental appt on 12/07/10  Any missed doses?: no       Is patient compliant with meds? yes       Allergies: 1)  ! Demerol 2)  ! Codeine 3)  ! Zocor 4)  ! Lipitor 5)  ! Requip (Ropinirole Hcl) 6)  ! Zetia (Ezetimibe) 7)  ! * Emycin 8)  ! Morphine  Anticoagulation Management History:      The patient is taking warfarin and comes in today for a routine follow up visit.  Positive risk factors for bleeding include an age of 74 years or older.  The bleeding index is 'intermediate risk'.  Positive CHADS2 values include History of CHF and History of HTN.  Negative CHADS2 values include Age > 65 years old.  The start date was 05/13/1999.  Anticoagulation responsible provider: Tenny Craw MD, Gunnar Fusi.  INR POC: 2.0.  Cuvette Lot#: [id number].  Exp: 11/2011.    Anticoagulation Management Assessment/Plan:      The patient's current anticoagulation dose is Coumadin 5 mg tabs: as directed.  The target INR is 2 - 3.  The next INR is due 12/25/2010.  Anticoagulation instructions were given to patient.  Results were reviewed/authorized by Bethena Midget, RN, BSN.  She was notified by Cloyde Reams RN.         Prior Anticoagulation Instructions: INR 1.8  Coumadin 5 mg tablets - Take 1.5 tablets today, then take 1 tablet every day except 1/2  tablet on Tuesdays   Current Anticoagulation Instructions: INR 2.0 Change dose to 5mg s everyday. Recheck in 2 weeks.

## 2010-12-17 NOTE — Discharge Summary (Signed)
NAMEGERLINE, RATTO             ACCOUNT NO.:  [id number]  MEDICAL RECORD NO.:  [id number]          PATIENT TYPE:  INP  LOCATION:  2017                         FACILITY:  [facility]  PHYSICIAN:  Salvatore Decent. Cornelius Moras, M.D. DATE OF BIRTH:  [birth year]  DATE OF ADMISSION:  06/04/2010 DATE OF DISCHARGE:  06/16/2010                              HISTORY AND PHYSICAL   HISTORY OF PRESENT ILLNESS:  Please note that all the following information has been obtained for medical records as I did not personally perform the history and physical upon admission.  This is a 76 year old Caucasian female with a past medical history of hypertension, AFib, hyperlipidemia, and moderate aortic root enlargement, who was in her normal state of health until she developed sudden onset of severe chest pain, the afternoon of June 04, 2010.  EMS was summoned, and she presented to [facility] Emergency Room for further evaluation and treatment.  Electrocardiogram done en route was suggestive of acute myocardial ischemia.  The patient was brought directly to the cardiac cath lab for presumed ST-segment elevation myocardial infarction.  She underwent diagnostic a cardiac catheterization by Dr. Clifton James.  She was found to have an acute type A aortic dissection with an obvious flap in the proximal ascending aorta. There was left main coronary artery stenosis, severe aortic insufficiency with cardiogenic shock.  An emergent cardiothoracic consultation was obtained with Dr. Cornelius Moras.  The patient was seen very brief in the cardiac cath lab.  At the time of that evaluation, the patient was awake and alert and able to provide consent for emergency surgery.  Potential risks, complications, and benefits were discussed. The patient agreed to proceed and was brought directly from the cardiac catheterization lab to the operating room late the afternoon of June 04, 2010.  PAST MEDICAL HISTORY:  Significant for: 1. Hyperlipidemia. 2.  Atrial fibrillation (on chronic Coumadin therapy). 3. Hypertension. 4. TIA. 5. Moderate aortic root enlargement. 6. Peripheral neuropathy. 7. Fibromyalgia. 8. Obstructive sleep apnea. 9. GERD. 10.Esophageal stricture. 11.Mild to moderate right ventricular enlargement with biatrial and     pulmonary enlargement.  PAST SURGICAL HISTORY:  Significant for: 1. ASD closure in 1982. 2. Skin cancer removal. 3. Open right shoulder rotator cuff repair and subacromial     decompression, right shoulder distal clavicle resection, and right     shoulder biceps tendon tenodesis (by Dr. Rennis Chris in 2007). 4. Rectocele repair. 5. Hysterectomy. 6. Cardioversion x3. 7. Tubal ligation in 1969.  ALLERGIES:  CODEINE, DEMEROL, NEOMYCIN, LIPITOR, ZOCOR, AND BACITRACIN.  FAMILY HISTORY:  Mother deceased at age 14 from ALS.  Father deceased at age 74 from an MVA.  Three brothers all deceased from myocardial infarctions; one at age 81, one at age 100, one at age 20.  Sister died at age 31 from pancreatic cancer.  Another sister died at age 58 from melanoma.  MEDICATIONS:  Include the following: 1. Fioricet p.o. 1 tablet q.4 h p.r.n. 2. Azelastine nasal spray each nostril 1 spray daily p.r.n. 3. Coumadin 2 mg p.o. daily. 4. Gabapentin 300 mg p.o. b.i.d. 5. Zoloft 50 mg p.o. at bedtime. 6. Clonazepam 1 mg p.o. at  bedtime. 7. Metoprolol tartrate 50 mg p.o. 2 times daily. 8. Senna p.o. 0.5-1 tablet at bedtime p.r.n. 9. Crestor 5 mg p.o. every evening. 10.Multivitamin p.o. every evening. 11.Ocuvite 1 tablet p.o. b.i.d. 12.Ultram 50 mg 1 tablet every 6 hours p.r.n. pain. 13.Enteric-coated aspirin 81 mg p.o. daily. 14.Glucosamine chondroitin 1500 mg/200 mg 1 tablet p.o. 2 times daily. 15.Omeprazole 20 mg p.o. every evening. 16.Cipro 500 mg p.o. 2 times day. 17.Rhinocort nasal spray, 1 spray each nostril at bedtime. 18.Vitamin D3 1000 units p.o. daily.  REVIEW OF SYMPTOMS:  Noncontributory except for  those as previously stated.  PHYSICAL EXAMINATION:  Please see documentation from the emergency room.  ADMITTING LABS:  As follows.  CBC June 04, 2010:  H and H 11.7 and 34.5, white count 5900, platelet count of 152,000.  PT and INR were 30.6 and 2.96 respectively.  Cardiac panel done on June 04, 2010, total creatine kinase 100, CK-MB 1.4, relative index 1.4, troponin 0.02.  D-dimer 10.42.  Hemoglobin A1c 6.3.  Cardiac catheterization performed by Dr. Clifton James on June 04, 2010.  For specifics, please see medical records.  IMPRESSION AND PLAN:  Type A aortic dissection (with an obvious flap in the proximal ascending order).  Plan for emergency resection repair by Dr. Cornelius Moras on June 04, 2010.     Doree Fudge, PA   ______________________________ Salvatore Decent Cornelius Moras, M.D.   DZ/MEDQ  D:  09/03/2010  T:  09/04/2010  Job:  [id number]  Electronically Signed by Doree Fudge PA on 12/15/2010 10:43:38 AM Electronically Signed by Tressie Stalker M.D. on 12/17/2010 07:44:13 AM

## 2010-12-17 NOTE — Assessment & Plan Note (Signed)
Summary: 1 month rov    Visit Type:  Follow-up Referring Provider:  n/a Primary Provider:  Alroy Dust, MD   History of Present Illness: 75 year-old woman presenting for follow-up evaluation.  She underwent emergency surgery for Type A aortic dissection earlier this year. She underwent aortic root repair and CABG of the LAD and LCx. She has a remote history of surgical repair of ASD 1982 and paroxysmal atrial fibrillation treated with long-term warfarin.    She is doing much better than the time of her last visit 6 weeks ago. Reports improved appetite, better balance, resolution of dyspnea, and improvement of leg swelling. She is pleased with her overall progress.  Current Medications (verified): 1)  Fluticasone Propionate 50 Mcg/act Susp (Fluticasone Propionate) .Marland Kitchen.. 1-2 Sp in Each Nostril At Bedtime.Marland KitchenMarland Kitchen 2)  Coumadin 5 Mg Tabs (Warfarin Sodium) .... As Directed 3)  Metoprolol Tartrate 50 Mg  Tabs (Metoprolol Tartrate) .... Take One Tablet By Mouth Two Times A Day 4)  Crestor 10 Mg Tabs (Rosuvastatin Calcium) .... Take One-Half  Tablet By Mouth Daily. 5)  Gabapentin 300 Mg Caps (Gabapentin) .... Take 1 Cap By Mouth Two Times A Day... 6)  Tramadol Hcl 50 Mg Tabs (Tramadol Hcl) .... Take One Tablet By Mouth Three Times A Day As Needed For Pain 7)  Zoloft 50 Mg Tabs (Sertraline Hcl) .... Take One Daily 8)  Tums 500 Mg Chew (Calcium Carbonate Antacid) .... One By Mouth As Needed 9)  Ocuvite  Tabs (Multiple Vitamins-Minerals) .... Take 1 Tablet By Mouth Two Times A Day 10)  Stool Softener 100 Mg Caps (Docusate Sodium) .... One Capsule By Mouth At Bedtime 11)  Benefiber  Powd (Wheat Dextrin) .... As Needed 12)  Anusol-Hc 25 Mg Supp (Hydrocortisone Acetate) .... Insert 1 Suppository Into Rectum At Bedtime 13)  Prilosec 20 Mg Cpdr (Omeprazole) .... Take 1 Capsule By Mouth Two Times A Day 14)  Vitamin D 1000 Unit  Tabs (Cholecalciferol) .... Take 1 Tablet By Mouth Once A Day 15)   Glucosamine-Chondroitin  Caps (Glucosamine-Chondroit-Vit C-Mn) .... Take 1 Capsule By Mouth Two Times A Day 16)  Furosemide 80 Mg Tabs (Furosemide) .... Take One Tablet By Mouth Two Times A Day 17)  Ambien 10 Mg Tabs (Zolpidem Tartrate) .Marland Kitchen.. 1  At Bedtime As Needed 18)  Potassium Chloride Crys Cr 20 Meq Cr-Tabs (Potassium Chloride Crys Cr) .... Take Two Tablets By Mouth Once A Day Except On Wednesday Take Three Tablets Daily 19)  Aspirin 81 Mg Tbec (Aspirin) .... Take One Tablet By Mouth Daily 20)  Metolazone 2.5 Mg Tabs (Metolazone) .... Take One Tablet By Mouth 30 Minutes Prior To Furosemide Dose Every Wednesday Morning  Allergies (verified): 1)  ! Demerol 2)  ! Codeine 3)  ! Zocor 4)  ! Lipitor 5)  ! Requip (Ropinirole Hcl) 6)  ! Zetia (Ezetimibe) 7)  ! * Emycin 8)  ! Morphine  Past History:  Past medical history reviewed for relevance to current acute and chronic problems.  Past Medical History: Reviewed history from 09/04/2010 and no changes required. 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.01)   Type A aortic dissection 2011 - emergency surgery Hx of ATRIAL SEPTAL DEFECT (ICD-745.5) VENOUS INSUFFICIENCY (ICD-459.81) EDEMA (ICD-782.3) HYPERCHOLESTEROLEMIA (ICD-272.0) DIABETES MELLITUS, BORDERLINE (ICD-790.29) GERD (ICD-530.81) Hx of DYSPHAGIA (ICD-787.29) Hx of ESOPHAGEAL STRICTURE (ICD-530.3) IRRITABLE BOWEL SYNDROME (ICD-564.1) PERSONAL HX COLONIC  POLYPS (ICD-V12.72) Family Hx of COLON CANCER (ICD-153.9) TRANSIENT ISCHEMIC ATTACKS, HX OF (ICD-V12.50) PERIPHERAL NEUROPATHY (ICD-356.9) DEGENERATIVE JOINT DISEASE (ICD-715.90) BACK PAIN, LUMBAR (ICD-724.2) FIBROMYALGIA (ICD-729.1) ANXIETY DEPRESSION (ICD-300.4)  Review of Systems       Negative except as per  HPI   Vital Signs:  Patient profile:   76 year old female Height:      67 inches Weight:      141 pounds BMI:     22.16 Pulse rate:   65 / minute Resp:     16 per minute BP sitting:   07 / 68  (right arm)  Vitals Entered By: Marrion Coy, CNA (November 04, 2010 3:02 PM)  Physical Exam  General:  Pt is alert and oriented, in no acute distress. HEENT: normal Neck: normal carotid upstrokes without bruits, positive HJR Lungs: CTA CV: RRR with 2/6 systolic murmur at the LSB Abd: soft, NT, positive BS, no bruit, liver pulsations present Ext: trace pretibial edema bilaterally. peripheral pulses 2+ and equal Skin: warm and dry without rash    Impression & Recommendations:  Problem # 1:  ACUTE ON CHRONIC DIASTOLIC HEART FAILURE (ICD-428.33) Pt continues to improve. She is requiring fairly high doses of diuretics but this is the closest to euvolemic I have seen her in several months. Will check a BMET and BNP today. Otherwise will see back in 4 months.  Her updated medication list for this problem includes:    Coumadin 5 Mg Tabs (Warfarin sodium) .Marland Kitchen... As directed    Metoprolol Tartrate 50 Mg Tabs (Metoprolol tartrate) .Marland Kitchen... Take one tablet by mouth two times a day    Furosemide 80 Mg Tabs (Furosemide) .Marland Kitchen... Take one tablet by mouth two times a day    Aspirin 81 Mg Tbec (Aspirin) .Marland Kitchen... Take one tablet by mouth daily    Metolazone 2.5 Mg Tabs (Metolazone) .Marland Kitchen... Take one tablet by mouth 30 minutes prior to furosemide dose every wednesday morning  Orders: TLB-BMP (Basic Metabolic Panel-BMET) (80048-METABOL) TLB-BNP (B-Natriuretic Peptide) (83880-BNPR)  Problem # 2:  ATRIAL FIBRILLATION (ICD-427.31) Continue anticoagulation with warfarin. Pt maintaining sinus rhythm.  Her updated medication list for this problem includes:    Coumadin 5 Mg Tabs (Warfarin sodium) .Marland Kitchen... As directed    Metoprolol Tartrate 50 Mg Tabs (Metoprolol tartrate) .Marland Kitchen... Take one tablet by mouth two times a day     Aspirin 81 Mg Tbec (Aspirin) .Marland Kitchen... Take one tablet by mouth daily  Orders: TLB-BMP (Basic Metabolic Panel-BMET) (80048-METABOL) TLB-BNP (B-Natriuretic Peptide) (83880-BNPR)  Patient Instructions: 1)  Your physician recommends that you have lab work today: BMP and BNP 2)  Your physician recommends that you continue on your current medications as directed. Please refer to the Current Medication list given to you today. 3)  Your physician wants you to follow-up in:  4 MONTHS.  You will receive a reminder letter in the mail two months in advance. If you don't receive a letter, please call our office to schedule the follow-up appointment.

## 2010-12-17 NOTE — Medication Information (Signed)
Summary: rov/sp  Anticoagulant Therapy  Managed by: Bethena Midget, RN, BSN Referring MD: Tonny Bollman PCP: Alroy Dust, MD Supervising MD: Shirlee Latch MD, Dalton Indication 1: Atrial Fibrillation (ICD-427.31) Lab Used: [facility] [place] Site: [address] INR POC 1.4 INR RANGE 2 - 3  Dietary changes: no    Health status changes: no    Bleeding/hemorrhagic complications: yes       Details: Scant amt nosebleed   Recent/future hospitalizations: no    Any changes in medication regimen? no    Recent/future dental: no  Any missed doses?: no       Is patient compliant with meds? yes      Comments: Seeing Dr Excell Seltzer today  Allergies: 1)  ! Demerol 2)  ! Codeine 3)  ! Zocor 4)  ! Lipitor 5)  ! Requip (Ropinirole Hcl) 6)  ! Zetia (Ezetimibe) 7)  ! * Emycin 8)  ! Morphine  Anticoagulation Management History:      The patient is taking warfarin and comes in today for a routine follow up visit.  Positive risk factors for bleeding include an age of 74 years or older.  The bleeding index is 'intermediate risk'.  Positive CHADS2 values include History of CHF and History of HTN.  Negative CHADS2 values include Age > 74 years old.  The start date was 05/13/1999.  Anticoagulation responsible provider: Shirlee Latch MD, [place].  INR POC: 1.4.  Cuvette Lot#: [id number].  Exp: 12/2011.    Anticoagulation Management Assessment/Plan:      The patient's current anticoagulation dose is Coumadin 5 mg tabs: as directed.  The target INR is 2 - 3.  The next INR is due 11/13/2010.  Anticoagulation instructions were given to patient.  Results were reviewed/authorized by Bethena Midget, RN, BSN.  She was notified by Bethena Midget, RN, BSN.         Prior Anticoagulation Instructions: INR 1.7  Take 1 tablet today then resume same dose of 1/2 tablet every day.  Recheck INR in 2 weeks.   Current Anticoagulation Instructions: INR 1.4 Today take 5mg s then change dose to 2.5mg s everyday except 5mg s on  Sundays and Wednesdays. Recheck in 10 days.

## 2010-12-17 NOTE — Letter (Signed)
[facility]: [facility]   Imported By: Marylou Mccoy 11/23/2010 15:02:40  _____________________________________________________________________  External Attachment:    Type:   Image     Comment:   External Document

## 2010-12-17 NOTE — Medication Information (Signed)
Summary: rov/tm  Anticoagulant Therapy  Managed by: Weston Brass, PharmD Referring MD: Tonny Bollman PCP: Alroy Dust, MD Supervising MD: Clifton James MD, Cristal Deer Indication 1: Atrial Fibrillation (ICD-427.31) Lab Used: [facility] [place] Site: [address] INR POC 1.7 INR RANGE 2 - 3  Dietary changes: no    Health status changes: no    Bleeding/hemorrhagic complications: no    Recent/future hospitalizations: no    Any changes in medication regimen? yes       Details: stopped amiodarone in November  Recent/future dental: no  Any missed doses?: no       Is patient compliant with meds? yes       Allergies: 1)  ! Demerol 2)  ! Codeine 3)  ! Zocor 4)  ! Lipitor 5)  ! Requip (Ropinirole Hcl) 6)  ! Zetia (Ezetimibe) 7)  ! * Emycin 8)  ! Morphine  Anticoagulation Management History:      The patient is taking warfarin and comes in today for a routine follow up visit.  Positive risk factors for bleeding include an age of 73 years or older.  The bleeding index is 'intermediate risk'.  Positive CHADS2 values include History of CHF and History of HTN.  Negative CHADS2 values include Age > 36 years old.  The start date was 05/13/1999.  Anticoagulation responsible Terrina Docter: Clifton James MD, Cristal Deer.  INR POC: 1.7.  Exp: 12/2011.    Anticoagulation Management Assessment/Plan:      The patient's current anticoagulation dose is Coumadin 5 mg tabs: as directed.  The target INR is 2 - 3.  The next INR is due 11/27/2010.  Anticoagulation instructions were given to patient.  Results were reviewed/authorized by Weston Brass, PharmD.  She was notified by Weston Brass PharmD.         Prior Anticoagulation Instructions: INR 1.4 Today take 5mg s then change dose to 2.5mg s everyday except 5mg s on Sundays and Wednesdays. Recheck in 10 days.   Current Anticoagulation Instructions: INR 1.7  Increase dose to 1 tablet every day except 1/2 tablet on Tuesday, Thursday and Saturday.   Recheck INR in 2 weeks.

## 2010-12-17 NOTE — Letter (Signed)
Summary: [facility]  [facility]   Imported By: Sherian Rein 11/27/2010 12:43:34  _____________________________________________________________________  External Attachment:    Type:   Image     Comment:   External Document

## 2010-12-18 ENCOUNTER — Encounter (HOSPITAL_COMMUNITY): Payer: Medicare Other

## 2010-12-21 ENCOUNTER — Encounter (HOSPITAL_COMMUNITY): Payer: Medicare Other

## 2010-12-23 ENCOUNTER — Encounter (HOSPITAL_COMMUNITY): Payer: Medicare Other

## 2010-12-25 ENCOUNTER — Encounter: Payer: Self-pay | Admitting: Cardiology

## 2010-12-25 ENCOUNTER — Encounter (INDEPENDENT_AMBULATORY_CARE_PROVIDER_SITE_OTHER): Payer: Medicare Other

## 2010-12-25 ENCOUNTER — Encounter (HOSPITAL_COMMUNITY): Payer: Medicare Other

## 2010-12-25 DIAGNOSIS — Z7901 Long term (current) use of anticoagulants: Secondary | ICD-10-CM

## 2010-12-25 DIAGNOSIS — I4891 Unspecified atrial fibrillation: Secondary | ICD-10-CM

## 2010-12-28 ENCOUNTER — Encounter (HOSPITAL_COMMUNITY): Payer: Medicare Other

## 2010-12-30 ENCOUNTER — Encounter (HOSPITAL_COMMUNITY): Payer: Medicare Other

## 2010-12-31 NOTE — Medication Information (Signed)
Summary: Prescription Order   Prescription Order   Imported By: Roderic Ovens 12/22/2010 12:44:07  _____________________________________________________________________  External Attachment:    Type:   Image     Comment:   External Document

## 2010-12-31 NOTE — Medication Information (Signed)
Summary: [facility]  Anticoagulant Therapy  Managed by: Georgina Pillion, PharmD Referring MD: Tonny Bollman PCP: Alroy Dust, MD Supervising MD: Daleen Squibb MD, Maisie Fus Indication 1: Atrial Fibrillation (ICD-427.31) Lab Used: [facility] [place] Site: [address] INR POC 2.4 INR RANGE 2 - 3  Dietary changes: no    Health status changes: no    Bleeding/hemorrhagic complications: yes       Details: More nosebleeds lately  Recent/future hospitalizations: no    Any changes in medication regimen? no    Recent/future dental: no  Any missed doses?: no       Is patient compliant with meds? yes       Allergies: 1)  ! Demerol 2)  ! Codeine 3)  ! Zocor 4)  ! Lipitor 5)  ! Requip (Ropinirole Hcl) 6)  ! Zetia (Ezetimibe) 7)  ! * Emycin 8)  ! Morphine  Anticoagulation Management History:      Positive risk factors for bleeding include an age of 74 years or older.  The bleeding index is 'intermediate risk'.  Positive CHADS2 values include History of CHF and History of HTN.  Negative CHADS2 values include Age > 76 years old.  The start date was 05/13/1999.  Anticoagulation responsible provider: Daleen Squibb MD, Maisie Fus.  INR POC: 2.4.  Cuvette Lot#: [id number].  Exp: 11/2011.    Anticoagulation Management Assessment/Plan:      The patient's current anticoagulation dose is Coumadin 5 mg tabs: as directed.  The target INR is 2 - 3.  The next INR is due 01/15/2011.  Anticoagulation instructions were given to patient.  Results were reviewed/authorized by Georgina Pillion, PharmD.  She was notified by Georgina Pillion PharmD.         Prior Anticoagulation Instructions: INR 2.0 Change dose to 5mg s everyday. Recheck in 2 weeks.   Current Anticoagulation Instructions: Continue taking coumadin as scheduled with 1 tablet (5 mg) daily.  INR 2.4

## 2010-12-31 NOTE — Progress Notes (Signed)
Summary: [facility]: Office Visit  [facility]: Office Visit   Imported By: Earl Many 12/23/2010 17:48:51  _____________________________________________________________________  External Attachment:    Type:   Image     Comment:   External Document

## 2011-01-01 ENCOUNTER — Encounter (HOSPITAL_COMMUNITY): Payer: Medicare Other

## 2011-01-04 ENCOUNTER — Encounter (HOSPITAL_COMMUNITY): Payer: Medicare Other

## 2011-01-04 DIAGNOSIS — Z8679 Personal history of other diseases of the circulatory system: Secondary | ICD-10-CM

## 2011-01-04 DIAGNOSIS — I4891 Unspecified atrial fibrillation: Secondary | ICD-10-CM

## 2011-01-06 ENCOUNTER — Encounter (HOSPITAL_COMMUNITY): Payer: Medicare Other

## 2011-01-07 ENCOUNTER — Emergency Department (INDEPENDENT_AMBULATORY_CARE_PROVIDER_SITE_OTHER): Payer: Medicare Other

## 2011-01-07 ENCOUNTER — Emergency Department (HOSPITAL_BASED_OUTPATIENT_CLINIC_OR_DEPARTMENT_OTHER)
Admission: EM | Admit: 2011-01-07 | Discharge: 2011-01-08 | Disposition: A | Discharge status: Home / Self Care | Payer: Medicare Other | Attending: Emergency Medicine | Admitting: Emergency Medicine

## 2011-01-07 DIAGNOSIS — I509 Heart failure, unspecified: Secondary | ICD-10-CM | POA: Insufficient documentation

## 2011-01-07 DIAGNOSIS — Z79899 Other long term (current) drug therapy: Secondary | ICD-10-CM | POA: Insufficient documentation

## 2011-01-07 DIAGNOSIS — Z7982 Long term (current) use of aspirin: Secondary | ICD-10-CM | POA: Insufficient documentation

## 2011-01-07 DIAGNOSIS — Z7901 Long term (current) use of anticoagulants: Secondary | ICD-10-CM | POA: Insufficient documentation

## 2011-01-07 DIAGNOSIS — Z8673 Personal history of transient ischemic attack (TIA), and cerebral infarction without residual deficits: Secondary | ICD-10-CM | POA: Insufficient documentation

## 2011-01-07 DIAGNOSIS — R58 Hemorrhage, not elsewhere classified: Secondary | ICD-10-CM | POA: Insufficient documentation

## 2011-01-07 DIAGNOSIS — I4891 Unspecified atrial fibrillation: Secondary | ICD-10-CM | POA: Insufficient documentation

## 2011-01-07 DIAGNOSIS — R042 Hemoptysis: Secondary | ICD-10-CM | POA: Insufficient documentation

## 2011-01-07 DIAGNOSIS — J438 Other emphysema: Secondary | ICD-10-CM

## 2011-01-07 LAB — BASIC METABOLIC PANEL
Calcium: 9.4 mg/dL (ref 8.4–10.5)
GFR calc non Af Amer: >60 mL/min (ref >60)
Potassium: 3.3 mEq/L — ABNORMAL LOW (ref 3.5–5.1)
Sodium: 143 mEq/L (ref 135–145)

## 2011-01-07 LAB — DIFFERENTIAL
Basophils Absolute: 0 10*3/uL (ref 0.0–0.1)
Basophils Relative: 1 % (ref 0–1)
Eosinophils Absolute: 0.1 10*3/uL (ref 0.0–0.7)
Eosinophils Relative: 2 % (ref 0–5)
Lymphocytes Relative: 27 % (ref 12–46)

## 2011-01-07 LAB — PROTIME-INR
INR: 2.03 — ABNORMAL HIGH (ref 0.00–1.49)
Prothrombin Time: 23.1 seconds — ABNORMAL HIGH (ref 11.6–15.2)

## 2011-01-07 LAB — CBC
Platelets: 128 10*3/uL — ABNORMAL LOW (ref 150–400)
RDW: 14.1 % (ref 11.5–15.5)
WBC: 4.6 10*3/uL (ref 4.0–10.5)

## 2011-01-08 ENCOUNTER — Encounter (HOSPITAL_COMMUNITY): Payer: Medicare Other

## 2011-01-11 ENCOUNTER — Encounter (HOSPITAL_COMMUNITY): Payer: Medicare Other

## 2011-01-13 ENCOUNTER — Encounter (HOSPITAL_COMMUNITY): Payer: Medicare Other

## 2011-01-14 ENCOUNTER — Encounter: Payer: Self-pay | Admitting: Internal Medicine

## 2011-01-14 ENCOUNTER — Encounter (INDEPENDENT_AMBULATORY_CARE_PROVIDER_SITE_OTHER): Payer: Medicare Other

## 2011-01-14 DIAGNOSIS — Z7901 Long term (current) use of anticoagulants: Secondary | ICD-10-CM

## 2011-01-14 DIAGNOSIS — I4891 Unspecified atrial fibrillation: Secondary | ICD-10-CM

## 2011-01-14 LAB — CONVERTED CEMR LAB: POC INR: 2.2

## 2011-01-15 ENCOUNTER — Encounter (HOSPITAL_COMMUNITY): Payer: Medicare Other | Attending: Cardiovascular Disease

## 2011-01-15 DIAGNOSIS — Z5189 Encounter for other specified aftercare: Secondary | ICD-10-CM | POA: Insufficient documentation

## 2011-01-15 DIAGNOSIS — G4733 Obstructive sleep apnea (adult) (pediatric): Secondary | ICD-10-CM | POA: Insufficient documentation

## 2011-01-15 DIAGNOSIS — I359 Nonrheumatic aortic valve disorder, unspecified: Secondary | ICD-10-CM | POA: Insufficient documentation

## 2011-01-15 DIAGNOSIS — I2510 Atherosclerotic heart disease of native coronary artery without angina pectoris: Secondary | ICD-10-CM | POA: Insufficient documentation

## 2011-01-15 DIAGNOSIS — I10 Essential (primary) hypertension: Secondary | ICD-10-CM | POA: Insufficient documentation

## 2011-01-15 DIAGNOSIS — Z951 Presence of aortocoronary bypass graft: Secondary | ICD-10-CM | POA: Insufficient documentation

## 2011-01-15 DIAGNOSIS — Z7901 Long term (current) use of anticoagulants: Secondary | ICD-10-CM | POA: Insufficient documentation

## 2011-01-15 DIAGNOSIS — I259 Chronic ischemic heart disease, unspecified: Secondary | ICD-10-CM | POA: Insufficient documentation

## 2011-01-15 DIAGNOSIS — Z9889 Other specified postprocedural states: Secondary | ICD-10-CM | POA: Insufficient documentation

## 2011-01-15 DIAGNOSIS — K219 Gastro-esophageal reflux disease without esophagitis: Secondary | ICD-10-CM | POA: Insufficient documentation

## 2011-01-15 DIAGNOSIS — Z8673 Personal history of transient ischemic attack (TIA), and cerebral infarction without residual deficits: Secondary | ICD-10-CM | POA: Insufficient documentation

## 2011-01-15 DIAGNOSIS — E785 Hyperlipidemia, unspecified: Secondary | ICD-10-CM | POA: Insufficient documentation

## 2011-01-15 DIAGNOSIS — I4892 Unspecified atrial flutter: Secondary | ICD-10-CM | POA: Insufficient documentation

## 2011-01-15 DIAGNOSIS — I4891 Unspecified atrial fibrillation: Secondary | ICD-10-CM | POA: Insufficient documentation

## 2011-01-18 ENCOUNTER — Encounter (HOSPITAL_COMMUNITY): Payer: Medicare Other

## 2011-01-20 ENCOUNTER — Encounter (HOSPITAL_COMMUNITY): Payer: Medicare Other

## 2011-01-21 ENCOUNTER — Telehealth: Payer: Self-pay | Admitting: Cardiovascular Disease

## 2011-01-21 NOTE — Medication Information (Signed)
Summary: [name]  Anticoagulant Therapy  Managed by: Cloyde Reams, RN, BSN Referring MD: Tonny Bollman PCP: Alroy Dust, MD Supervising MD: Johney Frame MD, Fayrene Fearing Indication 1: Atrial Fibrillation (ICD-427.31) Lab Used: [facility] [place] Site: [address] INR POC 2.2 INR RANGE 2 - 3  Dietary changes: no    Health status changes: no    Bleeding/hemorrhagic complications: yes       Details: Went to [facility] secondary to 3 episodes of blood in her month, INR done on 2.03.  Last nose bleed 2 weeks ago.    Recent/future hospitalizations: no    Any changes in medication regimen? no    Recent/future dental: no  Any missed doses?: yes     Details: Held x 1 dose when bleeding, took an extra 1/2 tablet the next day per our instructions.   Is patient compliant with meds? yes       Allergies: 1)  ! Demerol 2)  ! Codeine 3)  ! Zocor 4)  ! Lipitor 5)  ! Requip (Ropinirole Hcl) 6)  ! Zetia (Ezetimibe) 7)  ! * Emycin 8)  ! Morphine  Anticoagulation Management History:      The patient is taking warfarin and comes in today for a routine follow up visit.  Positive risk factors for bleeding include an age of 74 years or older.  The bleeding index is 'intermediate risk'.  Positive CHADS2 values include History of CHF and History of HTN.  Negative CHADS2 values include Age > 74 years old.  The start date was 05/13/1999.  Anticoagulation responsible provider: Zackrey Dyar MD, Fayrene Fearing.  INR POC: 2.2.  Cuvette Lot#: [id number].  Exp: 11/2011.    Anticoagulation Management Assessment/Plan:      The patient's current anticoagulation dose is Coumadin 5 mg tabs: as directed.  The target INR is 2 - 3.  The next INR is due 02/11/2011.  Anticoagulation instructions were given to patient.  Results were reviewed/authorized by Cloyde Reams, RN, BSN.  She was notified by Cloyde Reams RN.         Prior Anticoagulation Instructions: Continue taking coumadin as scheduled with 1 tablet (5 mg)  daily.  INR 2.4  Current Anticoagulation Instructions: INR 2.2  Continue on same dosage 5mg  daily.  Recheck in 4 weeks.

## 2011-01-22 ENCOUNTER — Encounter (HOSPITAL_COMMUNITY): Payer: Medicare Other

## 2011-01-25 ENCOUNTER — Encounter (HOSPITAL_COMMUNITY): Payer: Medicare Other

## 2011-01-27 ENCOUNTER — Encounter (HOSPITAL_COMMUNITY): Payer: Medicare Other

## 2011-01-29 ENCOUNTER — Encounter (HOSPITAL_COMMUNITY): Payer: Medicare Other

## 2011-01-29 LAB — CBC
HCT: 29.1 % — ABNORMAL LOW (ref 36.0–46.0)
HCT: 30.6 % — ABNORMAL LOW (ref 36.0–46.0)
HCT: 31.8 % — ABNORMAL LOW (ref 36.0–46.0)
HCT: 35.3 % — ABNORMAL LOW (ref 36.0–46.0)
Hemoglobin: 10.5 g/dL — ABNORMAL LOW (ref 12.0–15.0)
Hemoglobin: 11.9 g/dL — ABNORMAL LOW (ref 12.0–15.0)
MCH: 30.4 pg (ref 26.0–34.0)
MCH: 31.4 pg (ref 26.0–34.0)
MCH: 32.1 pg (ref 26.0–34.0)
MCHC: 33 g/dL (ref 30.0–36.0)
MCHC: 33.6 g/dL (ref 30.0–36.0)
MCV: 95.2 fL (ref 78.0–100.0)
MCV: 95.5 fL (ref 78.0–100.0)
MCV: 95.9 fL (ref 78.0–100.0)
Platelets: 228 10*3/uL (ref 150–400)
Platelets: 360 10*3/uL (ref 150–400)
Platelets: 383 10*3/uL (ref 150–400)
RBC: 3.19 MIL/uL — ABNORMAL LOW (ref 3.87–5.11)
RBC: 3.34 MIL/uL — ABNORMAL LOW (ref 3.87–5.11)
RBC: 3.7 MIL/uL — ABNORMAL LOW (ref 3.87–5.11)
RDW: 16.9 % — ABNORMAL HIGH (ref 11.5–15.5)
RDW: 15.9 % — ABNORMAL HIGH (ref 11.5–15.5)
RDW: 16.8 % — ABNORMAL HIGH (ref 11.5–15.5)
WBC: 4.3 10*3/uL (ref 4.0–10.5)
WBC: 10.7 10*3/uL — ABNORMAL HIGH (ref 4.0–10.5)
WBC: 10.8 10*3/uL — ABNORMAL HIGH (ref 4.0–10.5)
WBC: 9.1 10*3/uL (ref 4.0–10.5)

## 2011-01-29 LAB — BASIC METABOLIC PANEL
BUN: 12 mg/dL (ref 6–23)
BUN: 10 mg/dL (ref 6–23)
BUN: 8 mg/dL (ref 6–23)
BUN: 8 mg/dL (ref 6–23)
CO2: 26 mEq/L (ref 19–32)
CO2: 28 mEq/L (ref 19–32)
CO2: 29 mEq/L (ref 19–32)
Calcium: 8.7 mg/dL (ref 8.4–10.5)
Calcium: 9.3 mg/dL (ref 8.4–10.5)
Chloride: 105 mEq/L (ref 96–112)
Chloride: 99 mEq/L (ref 96–112)
Chloride: 99 mEq/L (ref 96–112)
Creatinine, Ser: 0.7 mg/dL (ref 0.4–1.2)
Creatinine, Ser: 0.49 mg/dL (ref 0.4–1.2)
Creatinine, Ser: 0.56 mg/dL (ref 0.4–1.2)
Creatinine, Ser: 0.6 mg/dL (ref 0.4–1.2)
GFR calc Af Amer: >60 mL/min (ref >60)
GFR calc Af Amer: >60 mL/min (ref >60)
GFR calc Af Amer: >60 mL/min (ref >60)
GFR calc non Af Amer: >60 mL/min (ref >60)
GFR calc non Af Amer: >60 mL/min (ref >60)
GFR calc non Af Amer: >60 mL/min (ref >60)
Glucose, Bld: 111 mg/dL — ABNORMAL HIGH (ref 70–99)
Glucose, Bld: 119 mg/dL — ABNORMAL HIGH (ref 70–99)
Potassium: 4 mEq/L (ref 3.5–5.1)
Potassium: 3.5 mEq/L (ref 3.5–5.1)
Potassium: 3.7 mEq/L (ref 3.5–5.1)
Potassium: 3.7 mEq/L (ref 3.5–5.1)
Sodium: 137 mEq/L (ref 135–145)
Sodium: 140 mEq/L (ref 135–145)

## 2011-01-29 LAB — CULTURE, BLOOD (ROUTINE X 2)
Culture: NO GROWTH
Culture: NO GROWTH

## 2011-01-29 LAB — POCT I-STAT 3, ART BLOOD GAS (G3+)
Acid-Base Excess: 4 mmol/L — ABNORMAL HIGH (ref 0.0–2.0)
pH, Arterial: 7.472 — ABNORMAL HIGH (ref 7.350–7.400)
pO2, Arterial: 86 mmHg (ref 80.0–100.0)

## 2011-01-29 LAB — PROTIME-INR
INR: 1.43 (ref 0.00–1.49)
INR: 1.76 — ABNORMAL HIGH (ref 0.00–1.49)
INR: 2.09 — ABNORMAL HIGH (ref 0.00–1.49)
INR: 2.25 — ABNORMAL HIGH (ref 0.00–1.49)
INR: 2.3 — ABNORMAL HIGH (ref 0.00–1.49)
Prothrombin Time: 17.3 seconds — ABNORMAL HIGH (ref 11.6–15.2)
Prothrombin Time: 20.7 seconds — ABNORMAL HIGH (ref 11.6–15.2)
Prothrombin Time: 23.6 seconds — ABNORMAL HIGH (ref 11.6–15.2)
Prothrombin Time: 25 seconds — ABNORMAL HIGH (ref 11.6–15.2)
Prothrombin Time: 25.4 seconds — ABNORMAL HIGH (ref 11.6–15.2)

## 2011-01-29 LAB — DIFFERENTIAL
Basophils Absolute: 0 10*3/uL (ref 0.0–0.1)
Basophils Absolute: 0 10*3/uL (ref 0.0–0.1)
Basophils Relative: 0 % (ref 0–1)
Eosinophils Absolute: 0.1 10*3/uL (ref 0.0–0.7)
Eosinophils Relative: 1 % (ref 0–5)
Lymphocytes Relative: 25 % (ref 12–46)
Lymphocytes Relative: 10 % — ABNORMAL LOW (ref 12–46)
Lymphs Abs: 1 10*3/uL (ref 0.7–4.0)
Monocytes Absolute: 0.8 10*3/uL (ref 0.1–1.0)
Monocytes Relative: 9 % (ref 3–12)
Neutro Abs: 2.6 10*3/uL (ref 1.7–7.7)
Neutro Abs: 7.2 10*3/uL (ref 1.7–7.7)
Neutrophils Relative %: 60 % (ref 43–77)
Neutrophils Relative %: 79 % — ABNORMAL HIGH (ref 43–77)

## 2011-01-29 LAB — URINE CULTURE
Colony Count: >=100000
Culture  Setup Time: 201108042138

## 2011-01-29 LAB — COMPREHENSIVE METABOLIC PANEL
ALT: 16 U/L (ref 0–35)
AST: 30 U/L (ref 0–37)
Albumin: 3.4 g/dL — ABNORMAL LOW (ref 3.5–5.2)
Alkaline Phosphatase: 63 U/L (ref 39–117)
BUN: 10 mg/dL (ref 6–23)
CO2: 26 mEq/L (ref 19–32)
Calcium: 9 mg/dL (ref 8.4–10.5)
Chloride: 101 mEq/L (ref 96–112)
Creatinine, Ser: 0.54 mg/dL (ref 0.4–1.2)
GFR calc Af Amer: >60 mL/min (ref >60)
GFR calc non Af Amer: >60 mL/min (ref >60)
Glucose, Bld: 126 mg/dL — ABNORMAL HIGH (ref 70–99)
Potassium: 4 mEq/L (ref 3.5–5.1)
Sodium: 137 mEq/L (ref 135–145)
Total Bilirubin: 1.2 mg/dL (ref 0.3–1.2)
Total Protein: 7.2 g/dL (ref 6.0–8.3)

## 2011-01-29 LAB — POCT CARDIAC MARKERS
CKMB, poc: 1 ng/mL (ref 1.0–8.0)
Myoglobin, poc: 28.5 ng/mL (ref 12–200)
Troponin i, poc: <0.05 ng/mL (ref 0.00–0.09)

## 2011-01-29 LAB — GLUCOSE, CAPILLARY
Glucose-Capillary: 116 mg/dL — ABNORMAL HIGH (ref 70–99)
Glucose-Capillary: 120 mg/dL — ABNORMAL HIGH (ref 70–99)
Glucose-Capillary: 123 mg/dL — ABNORMAL HIGH (ref 70–99)
Glucose-Capillary: 140 mg/dL — ABNORMAL HIGH (ref 70–99)
Glucose-Capillary: 160 mg/dL — ABNORMAL HIGH (ref 70–99)

## 2011-01-29 LAB — TSH: TSH: 2.508 u[IU]/mL (ref 0.350–4.500)

## 2011-01-29 LAB — MRSA PCR SCREENING: MRSA by PCR: NEGATIVE

## 2011-01-30 LAB — POCT I-STAT 4, (NA,K, GLUC, HGB,HCT)
Glucose, Bld: 115 mg/dL — ABNORMAL HIGH (ref 70–99)
Glucose, Bld: 117 mg/dL — ABNORMAL HIGH (ref 70–99)
Glucose, Bld: 150 mg/dL — ABNORMAL HIGH (ref 70–99)
Glucose, Bld: 198 mg/dL — ABNORMAL HIGH (ref 70–99)
HCT: 23 % — ABNORMAL LOW (ref 36.0–46.0)
HCT: 24 % — ABNORMAL LOW (ref 36.0–46.0)
HCT: 28 % — ABNORMAL LOW (ref 36.0–46.0)
HCT: 30 % — ABNORMAL LOW (ref 36.0–46.0)
Hemoglobin: 10.2 g/dL — ABNORMAL LOW (ref 12.0–15.0)
Hemoglobin: 7.1 g/dL — ABNORMAL LOW (ref 12.0–15.0)
Hemoglobin: 8.2 g/dL — ABNORMAL LOW (ref 12.0–15.0)
Hemoglobin: 8.8 g/dL — ABNORMAL LOW (ref 12.0–15.0)
Hemoglobin: 9.5 g/dL — ABNORMAL LOW (ref 12.0–15.0)
Potassium: 2.6 mEq/L — CL (ref 3.5–5.1)
Potassium: 3 mEq/L — ABNORMAL LOW (ref 3.5–5.1)
Potassium: 3.2 mEq/L — ABNORMAL LOW (ref 3.5–5.1)
Potassium: 3.8 mEq/L (ref 3.5–5.1)
Potassium: 3.9 mEq/L (ref 3.5–5.1)
Potassium: 4.1 mEq/L (ref 3.5–5.1)
Potassium: 4.3 mEq/L (ref 3.5–5.1)
Sodium: 137 mEq/L (ref 135–145)
Sodium: 138 mEq/L (ref 135–145)
Sodium: 140 mEq/L (ref 135–145)
Sodium: 141 mEq/L (ref 135–145)
Sodium: 142 mEq/L (ref 135–145)
Sodium: 146 mEq/L — ABNORMAL HIGH (ref 135–145)
Sodium: 146 mEq/L — ABNORMAL HIGH (ref 135–145)
Sodium: 146 mEq/L — ABNORMAL HIGH (ref 135–145)

## 2011-01-30 LAB — CBC
HCT: 24.5 % — ABNORMAL LOW (ref 36.0–46.0)
HCT: 25.1 % — ABNORMAL LOW (ref 36.0–46.0)
HCT: 25.2 % — ABNORMAL LOW (ref 36.0–46.0)
HCT: 25.5 % — ABNORMAL LOW (ref 36.0–46.0)
HCT: 25.6 % — ABNORMAL LOW (ref 36.0–46.0)
HCT: 25.7 % — ABNORMAL LOW (ref 36.0–46.0)
HCT: 25.9 % — ABNORMAL LOW (ref 36.0–46.0)
HCT: 27.2 % — ABNORMAL LOW (ref 36.0–46.0)
HCT: 28.9 % — ABNORMAL LOW (ref 36.0–46.0)
HCT: 29 % — ABNORMAL LOW (ref 36.0–46.0)
HCT: 29.1 % — ABNORMAL LOW (ref 36.0–46.0)
HCT: 31.7 % — ABNORMAL LOW (ref 36.0–46.0)
HCT: 34.5 % — ABNORMAL LOW (ref 36.0–46.0)
Hemoglobin: 10.1 g/dL — ABNORMAL LOW (ref 12.0–15.0)
Hemoglobin: 10.7 g/dL — ABNORMAL LOW (ref 12.0–15.0)
Hemoglobin: 8.4 g/dL — ABNORMAL LOW (ref 12.0–15.0)
Hemoglobin: 8.6 g/dL — ABNORMAL LOW (ref 12.0–15.0)
Hemoglobin: 8.6 g/dL — ABNORMAL LOW (ref 12.0–15.0)
Hemoglobin: 8.7 g/dL — ABNORMAL LOW (ref 12.0–15.0)
Hemoglobin: 8.8 g/dL — ABNORMAL LOW (ref 12.0–15.0)
Hemoglobin: 9.2 g/dL — ABNORMAL LOW (ref 12.0–15.0)
Hemoglobin: 9.3 g/dL — ABNORMAL LOW (ref 12.0–15.0)
Hemoglobin: 9.8 g/dL — ABNORMAL LOW (ref 12.0–15.0)
Hemoglobin: 9.9 g/dL — ABNORMAL LOW (ref 12.0–15.0)
MCH: 31.1 pg (ref 26.0–34.0)
MCH: 31.1 pg (ref 26.0–34.0)
MCH: 31.5 pg (ref 26.0–34.0)
MCH: 31.6 pg (ref 26.0–34.0)
MCH: 31.6 pg (ref 26.0–34.0)
MCH: 31.7 pg (ref 26.0–34.0)
MCH: 32 pg (ref 26.0–34.0)
MCH: 32.1 pg (ref 26.0–34.0)
MCH: 32.2 pg (ref 26.0–34.0)
MCH: 32.3 pg (ref 26.0–34.0)
MCH: 32.4 pg (ref 26.0–34.0)
MCH: 32.4 pg (ref 26.0–34.0)
MCHC: 33.5 g/dL (ref 30.0–36.0)
MCHC: 33.8 g/dL (ref 30.0–36.0)
MCHC: 33.8 g/dL (ref 30.0–36.0)
MCHC: 33.8 g/dL (ref 30.0–36.0)
MCHC: 33.9 g/dL (ref 30.0–36.0)
MCHC: 34.1 g/dL (ref 30.0–36.0)
MCHC: 34.1 g/dL (ref 30.0–36.0)
MCHC: 34.3 g/dL (ref 30.0–36.0)
MCHC: 34.3 g/dL (ref 30.0–36.0)
MCHC: 34.4 g/dL (ref 30.0–36.0)
MCHC: 34.5 g/dL (ref 30.0–36.0)
MCHC: 34.5 g/dL (ref 30.0–36.0)
MCHC: 34.8 g/dL (ref 30.0–36.0)
MCHC: 35.3 g/dL (ref 30.0–36.0)
MCV: 90.8 fL (ref 78.0–100.0)
MCV: 90.9 fL (ref 78.0–100.0)
MCV: 91.4 fL (ref 78.0–100.0)
MCV: 91.4 fL (ref 78.0–100.0)
MCV: 92.2 fL (ref 78.0–100.0)
MCV: 92.5 fL (ref 78.0–100.0)
MCV: 92.8 fL (ref 78.0–100.0)
MCV: 94 fL (ref 78.0–100.0)
MCV: 94.5 fL (ref 78.0–100.0)
MCV: 94.8 fL (ref 78.0–100.0)
MCV: 95.2 fL (ref 78.0–100.0)
MCV: 95.6 fL (ref 78.0–100.0)
Platelets: 101 10*3/uL — ABNORMAL LOW (ref 150–400)
Platelets: 123 10*3/uL — ABNORMAL LOW (ref 150–400)
Platelets: 160 10*3/uL (ref 150–400)
Platelets: 213 10*3/uL (ref 150–400)
Platelets: 263 10*3/uL (ref 150–400)
Platelets: 320 10*3/uL (ref 150–400)
Platelets: 53 10*3/uL — ABNORMAL LOW (ref 150–400)
Platelets: 63 10*3/uL — ABNORMAL LOW (ref 150–400)
Platelets: 63 10*3/uL — ABNORMAL LOW (ref 150–400)
Platelets: 66 10*3/uL — ABNORMAL LOW (ref 150–400)
Platelets: 91 10*3/uL — ABNORMAL LOW (ref 150–400)
RBC: 2.64 MIL/uL — ABNORMAL LOW (ref 3.87–5.11)
RBC: 2.71 MIL/uL — ABNORMAL LOW (ref 3.87–5.11)
RBC: 2.76 MIL/uL — ABNORMAL LOW (ref 3.87–5.11)
RBC: 2.78 MIL/uL — ABNORMAL LOW (ref 3.87–5.11)
RBC: 2.85 MIL/uL — ABNORMAL LOW (ref 3.87–5.11)
RBC: 2.95 MIL/uL — ABNORMAL LOW (ref 3.87–5.11)
RBC: 3.05 MIL/uL — ABNORMAL LOW (ref 3.87–5.11)
RBC: 3.06 MIL/uL — ABNORMAL LOW (ref 3.87–5.11)
RBC: 3.13 MIL/uL — ABNORMAL LOW (ref 3.87–5.11)
RBC: 3.32 MIL/uL — ABNORMAL LOW (ref 3.87–5.11)
RDW: 13.5 % (ref 11.5–15.5)
RDW: 14.3 % (ref 11.5–15.5)
RDW: 14.3 % (ref 11.5–15.5)
RDW: 14.5 % (ref 11.5–15.5)
RDW: 14.5 % (ref 11.5–15.5)
RDW: 14.7 % (ref 11.5–15.5)
RDW: 14.7 % (ref 11.5–15.5)
RDW: 14.8 % (ref 11.5–15.5)
RDW: 14.8 % (ref 11.5–15.5)
RDW: 14.9 % (ref 11.5–15.5)
RDW: 14.9 % (ref 11.5–15.5)
RDW: 15 % (ref 11.5–15.5)
RDW: 15.1 % (ref 11.5–15.5)
RDW: 15.4 % (ref 11.5–15.5)
RDW: 16.5 % — ABNORMAL HIGH (ref 11.5–15.5)
WBC: 10 10*3/uL (ref 4.0–10.5)
WBC: 11.6 10*3/uL — ABNORMAL HIGH (ref 4.0–10.5)
WBC: 11.6 10*3/uL — ABNORMAL HIGH (ref 4.0–10.5)
WBC: 11.9 10*3/uL — ABNORMAL HIGH (ref 4.0–10.5)
WBC: 16.3 10*3/uL — ABNORMAL HIGH (ref 4.0–10.5)
WBC: 7.5 10*3/uL (ref 4.0–10.5)
WBC: 8.2 10*3/uL (ref 4.0–10.5)
WBC: 8.2 10*3/uL (ref 4.0–10.5)
WBC: 8.8 10*3/uL (ref 4.0–10.5)
WBC: 9.3 10*3/uL (ref 4.0–10.5)
WBC: 9.7 10*3/uL (ref 4.0–10.5)

## 2011-01-30 LAB — POCT I-STAT, CHEM 8
Calcium, Ion: 0.82 mmol/L — ABNORMAL LOW (ref 1.12–1.32)
Calcium, Ion: 1.2 mmol/L (ref 1.12–1.32)
Chloride: 102 mEq/L (ref 96–112)
Chloride: 97 mEq/L (ref 96–112)
Creatinine, Ser: 0.6 mg/dL (ref 0.4–1.2)
Glucose, Bld: 103 mg/dL — ABNORMAL HIGH (ref 70–99)
Glucose, Bld: 113 mg/dL — ABNORMAL HIGH (ref 70–99)
Glucose, Bld: 128 mg/dL — ABNORMAL HIGH (ref 70–99)
HCT: 23 % — ABNORMAL LOW (ref 36.0–46.0)
HCT: 34 % — ABNORMAL LOW (ref 36.0–46.0)
Hemoglobin: 11.6 g/dL — ABNORMAL LOW (ref 12.0–15.0)
Hemoglobin: 7.8 g/dL — ABNORMAL LOW (ref 12.0–15.0)
Hemoglobin: 8.8 g/dL — ABNORMAL LOW (ref 12.0–15.0)
Hemoglobin: 9.9 g/dL — ABNORMAL LOW (ref 12.0–15.0)
Potassium: 2.7 mEq/L — CL (ref 3.5–5.1)
Potassium: 3.4 mEq/L — ABNORMAL LOW (ref 3.5–5.1)
Potassium: 3.5 mEq/L (ref 3.5–5.1)
Potassium: 3.7 mEq/L (ref 3.5–5.1)
Sodium: 131 mEq/L — ABNORMAL LOW (ref 135–145)
TCO2: 26 mmol/L (ref 0–100)
TCO2: 29 mmol/L (ref 0–100)

## 2011-01-30 LAB — PROTIME-INR
INR: 0.73 (ref 0.00–1.49)
INR: 1.09 (ref 0.00–1.49)
INR: 1.11 (ref 0.00–1.49)
INR: 1.2 (ref 0.00–1.49)
INR: 1.21 (ref 0.00–1.49)
INR: 1.23 (ref 0.00–1.49)
INR: 1.26 (ref 0.00–1.49)
INR: 1.3 (ref 0.00–1.49)
INR: 1.31 (ref 0.00–1.49)
INR: 2.96 — ABNORMAL HIGH (ref 0.00–1.49)
Prothrombin Time: 14 seconds (ref 11.6–15.2)
Prothrombin Time: 14.2 seconds (ref 11.6–15.2)
Prothrombin Time: 15.1 seconds (ref 11.6–15.2)
Prothrombin Time: 15.2 seconds (ref 11.6–15.2)
Prothrombin Time: 15.4 seconds — ABNORMAL HIGH (ref 11.6–15.2)
Prothrombin Time: 15.7 seconds — ABNORMAL HIGH (ref 11.6–15.2)
Prothrombin Time: 16.1 seconds — ABNORMAL HIGH (ref 11.6–15.2)
Prothrombin Time: 16.2 seconds — ABNORMAL HIGH (ref 11.6–15.2)
Prothrombin Time: 30.6 seconds — ABNORMAL HIGH (ref 11.6–15.2)

## 2011-01-30 LAB — BASIC METABOLIC PANEL
BUN: 10 mg/dL (ref 6–23)
BUN: 10 mg/dL (ref 6–23)
BUN: 10 mg/dL (ref 6–23)
BUN: 11 mg/dL (ref 6–23)
BUN: 12 mg/dL (ref 6–23)
BUN: 12 mg/dL (ref 6–23)
BUN: 13 mg/dL (ref 6–23)
BUN: 13 mg/dL (ref 6–23)
BUN: 15 mg/dL (ref 6–23)
BUN: 16 mg/dL (ref 6–23)
BUN: 7 mg/dL (ref 6–23)
BUN: 8 mg/dL (ref 6–23)
CO2: 26 mEq/L (ref 19–32)
CO2: 26 mEq/L (ref 19–32)
CO2: 27 mEq/L (ref 19–32)
CO2: 27 mEq/L (ref 19–32)
CO2: 30 mEq/L (ref 19–32)
CO2: 30 mEq/L (ref 19–32)
CO2: 30 mEq/L (ref 19–32)
CO2: 32 mEq/L (ref 19–32)
CO2: 32 mEq/L (ref 19–32)
CO2: 33 mEq/L — ABNORMAL HIGH (ref 19–32)
CO2: 34 mEq/L — ABNORMAL HIGH (ref 19–32)
Calcium: 7.1 mg/dL — ABNORMAL LOW (ref 8.4–10.5)
Calcium: 7.3 mg/dL — ABNORMAL LOW (ref 8.4–10.5)
Calcium: 7.7 mg/dL — ABNORMAL LOW (ref 8.4–10.5)
Calcium: 7.8 mg/dL — ABNORMAL LOW (ref 8.4–10.5)
Calcium: 7.9 mg/dL — ABNORMAL LOW (ref 8.4–10.5)
Calcium: 8.1 mg/dL — ABNORMAL LOW (ref 8.4–10.5)
Calcium: 8.1 mg/dL — ABNORMAL LOW (ref 8.4–10.5)
Calcium: 8.1 mg/dL — ABNORMAL LOW (ref 8.4–10.5)
Calcium: 8.6 mg/dL (ref 8.4–10.5)
Calcium: 8.7 mg/dL (ref 8.4–10.5)
Chloride: 100 mEq/L (ref 96–112)
Chloride: 102 mEq/L (ref 96–112)
Chloride: 103 mEq/L (ref 96–112)
Chloride: 106 mEq/L (ref 96–112)
Chloride: 106 mEq/L (ref 96–112)
Chloride: 107 mEq/L (ref 96–112)
Chloride: 96 mEq/L (ref 96–112)
Chloride: 97 mEq/L (ref 96–112)
Chloride: 99 mEq/L (ref 96–112)
Chloride: 99 mEq/L (ref 96–112)
Chloride: 99 mEq/L (ref 96–112)
Creatinine, Ser: 0.54 mg/dL (ref 0.4–1.2)
Creatinine, Ser: 0.54 mg/dL (ref 0.4–1.2)
Creatinine, Ser: 0.56 mg/dL (ref 0.4–1.2)
Creatinine, Ser: 0.56 mg/dL (ref 0.4–1.2)
Creatinine, Ser: 0.57 mg/dL (ref 0.4–1.2)
Creatinine, Ser: 0.58 mg/dL (ref 0.4–1.2)
Creatinine, Ser: 0.58 mg/dL (ref 0.4–1.2)
Creatinine, Ser: 0.6 mg/dL (ref 0.4–1.2)
Creatinine, Ser: 0.6 mg/dL (ref 0.4–1.2)
Creatinine, Ser: 0.65 mg/dL (ref 0.4–1.2)
Creatinine, Ser: 0.71 mg/dL (ref 0.4–1.2)
GFR calc Af Amer: >60 mL/min (ref >60)
GFR calc Af Amer: >60 mL/min (ref >60)
GFR calc Af Amer: >60 mL/min (ref >60)
GFR calc Af Amer: >60 mL/min (ref >60)
GFR calc Af Amer: >60 mL/min (ref >60)
GFR calc Af Amer: >60 mL/min (ref >60)
GFR calc Af Amer: >60 mL/min (ref >60)
GFR calc Af Amer: >60 mL/min (ref >60)
GFR calc Af Amer: >60 mL/min (ref >60)
GFR calc Af Amer: >60 mL/min (ref >60)
GFR calc Af Amer: >60 mL/min (ref >60)
GFR calc non Af Amer: >60 mL/min (ref >60)
GFR calc non Af Amer: >60 mL/min (ref >60)
GFR calc non Af Amer: >60 mL/min (ref >60)
GFR calc non Af Amer: >60 mL/min (ref >60)
GFR calc non Af Amer: >60 mL/min (ref >60)
GFR calc non Af Amer: >60 mL/min (ref >60)
GFR calc non Af Amer: >60 mL/min (ref >60)
GFR calc non Af Amer: >60 mL/min (ref >60)
GFR calc non Af Amer: >60 mL/min (ref >60)
GFR calc non Af Amer: >60 mL/min (ref >60)
GFR calc non Af Amer: >60 mL/min (ref >60)
GFR calc non Af Amer: >60 mL/min (ref >60)
Glucose, Bld: 106 mg/dL — ABNORMAL HIGH (ref 70–99)
Glucose, Bld: 109 mg/dL — ABNORMAL HIGH (ref 70–99)
Glucose, Bld: 111 mg/dL — ABNORMAL HIGH (ref 70–99)
Glucose, Bld: 112 mg/dL — ABNORMAL HIGH (ref 70–99)
Glucose, Bld: 123 mg/dL — ABNORMAL HIGH (ref 70–99)
Glucose, Bld: 129 mg/dL — ABNORMAL HIGH (ref 70–99)
Glucose, Bld: 152 mg/dL — ABNORMAL HIGH (ref 70–99)
Glucose, Bld: 153 mg/dL — ABNORMAL HIGH (ref 70–99)
Glucose, Bld: 160 mg/dL — ABNORMAL HIGH (ref 70–99)
Glucose, Bld: 164 mg/dL — ABNORMAL HIGH (ref 70–99)
Glucose, Bld: 99 mg/dL (ref 70–99)
Potassium: 2.7 mEq/L — CL (ref 3.5–5.1)
Potassium: 2.8 mEq/L — ABNORMAL LOW (ref 3.5–5.1)
Potassium: 3.3 mEq/L — ABNORMAL LOW (ref 3.5–5.1)
Potassium: 3.3 mEq/L — ABNORMAL LOW (ref 3.5–5.1)
Potassium: 3.4 mEq/L — ABNORMAL LOW (ref 3.5–5.1)
Potassium: 3.4 mEq/L — ABNORMAL LOW (ref 3.5–5.1)
Potassium: 3.5 mEq/L (ref 3.5–5.1)
Potassium: 3.6 mEq/L (ref 3.5–5.1)
Potassium: 3.7 mEq/L (ref 3.5–5.1)
Potassium: 4 mEq/L (ref 3.5–5.1)
Potassium: 4.2 mEq/L (ref 3.5–5.1)
Sodium: 133 mEq/L — ABNORMAL LOW (ref 135–145)
Sodium: 136 mEq/L (ref 135–145)
Sodium: 136 mEq/L (ref 135–145)
Sodium: 136 mEq/L (ref 135–145)
Sodium: 136 mEq/L (ref 135–145)
Sodium: 137 mEq/L (ref 135–145)
Sodium: 139 mEq/L (ref 135–145)
Sodium: 139 mEq/L (ref 135–145)
Sodium: 142 mEq/L (ref 135–145)
Sodium: 142 mEq/L (ref 135–145)
Sodium: 144 mEq/L (ref 135–145)

## 2011-01-30 LAB — CROSSMATCH

## 2011-01-30 LAB — POCT I-STAT 3, ART BLOOD GAS (G3+)
Acid-Base Excess: 4 mmol/L — ABNORMAL HIGH (ref 0.0–2.0)
Acid-base deficit: 1 mmol/L (ref 0.0–2.0)
Acid-base deficit: 2 mmol/L (ref 0.0–2.0)
Acid-base deficit: 5 mmol/L — ABNORMAL HIGH (ref 0.0–2.0)
Bicarbonate: 20.6 mEq/L (ref 20.0–24.0)
Bicarbonate: 22.2 mEq/L (ref 20.0–24.0)
Bicarbonate: 24.6 mEq/L — ABNORMAL HIGH (ref 20.0–24.0)
Bicarbonate: 25.7 mEq/L — ABNORMAL HIGH (ref 20.0–24.0)
O2 Saturation: 100 %
O2 Saturation: 99 %
O2 Saturation: 99 %
Patient temperature: 34.8
Patient temperature: 99
Patient temperature: 99.1
TCO2: 22 mmol/L (ref 0–100)
TCO2: 23 mmol/L (ref 0–100)
TCO2: 29 mmol/L (ref 0–100)
pCO2 arterial: 34.4 mmHg — ABNORMAL LOW (ref 35.0–45.0)
pCO2 arterial: 36.6 mmHg (ref 35.0–45.0)
pCO2 arterial: 39.4 mmHg (ref 35.0–45.0)
pH, Arterial: 7.387 (ref 7.350–7.400)
pH, Arterial: 7.434 — ABNORMAL HIGH (ref 7.350–7.400)
pH, Arterial: 7.456 — ABNORMAL HIGH (ref 7.350–7.400)
pH, Arterial: 7.478 — ABNORMAL HIGH (ref 7.350–7.400)
pO2, Arterial: 360 mmHg — ABNORMAL HIGH (ref 80.0–100.0)
pO2, Arterial: 43 mmHg — ABNORMAL LOW (ref 80.0–100.0)
pO2, Arterial: 66 mmHg — ABNORMAL LOW (ref 80.0–100.0)

## 2011-01-30 LAB — DIFFERENTIAL
Eosinophils Absolute: 0 10*3/uL (ref 0.0–0.7)
Eosinophils Relative: 0 % (ref 0–5)
Lymphs Abs: 0.3 10*3/uL — ABNORMAL LOW (ref 0.7–4.0)
Monocytes Absolute: 0.3 10*3/uL (ref 0.1–1.0)
Monocytes Relative: 5 % (ref 3–12)

## 2011-01-30 LAB — GLUCOSE, CAPILLARY
Glucose-Capillary: 105 mg/dL — ABNORMAL HIGH (ref 70–99)
Glucose-Capillary: 111 mg/dL — ABNORMAL HIGH (ref 70–99)
Glucose-Capillary: 113 mg/dL — ABNORMAL HIGH (ref 70–99)
Glucose-Capillary: 115 mg/dL — ABNORMAL HIGH (ref 70–99)
Glucose-Capillary: 116 mg/dL — ABNORMAL HIGH (ref 70–99)
Glucose-Capillary: 122 mg/dL — ABNORMAL HIGH (ref 70–99)
Glucose-Capillary: 122 mg/dL — ABNORMAL HIGH (ref 70–99)
Glucose-Capillary: 123 mg/dL — ABNORMAL HIGH (ref 70–99)
Glucose-Capillary: 124 mg/dL — ABNORMAL HIGH (ref 70–99)
Glucose-Capillary: 126 mg/dL — ABNORMAL HIGH (ref 70–99)
Glucose-Capillary: 129 mg/dL — ABNORMAL HIGH (ref 70–99)
Glucose-Capillary: 131 mg/dL — ABNORMAL HIGH (ref 70–99)
Glucose-Capillary: 133 mg/dL — ABNORMAL HIGH (ref 70–99)
Glucose-Capillary: 133 mg/dL — ABNORMAL HIGH (ref 70–99)
Glucose-Capillary: 134 mg/dL — ABNORMAL HIGH (ref 70–99)
Glucose-Capillary: 134 mg/dL — ABNORMAL HIGH (ref 70–99)
Glucose-Capillary: 137 mg/dL — ABNORMAL HIGH (ref 70–99)
Glucose-Capillary: 139 mg/dL — ABNORMAL HIGH (ref 70–99)
Glucose-Capillary: 140 mg/dL — ABNORMAL HIGH (ref 70–99)
Glucose-Capillary: 142 mg/dL — ABNORMAL HIGH (ref 70–99)
Glucose-Capillary: 145 mg/dL — ABNORMAL HIGH (ref 70–99)
Glucose-Capillary: 146 mg/dL — ABNORMAL HIGH (ref 70–99)
Glucose-Capillary: 148 mg/dL — ABNORMAL HIGH (ref 70–99)
Glucose-Capillary: 153 mg/dL — ABNORMAL HIGH (ref 70–99)
Glucose-Capillary: 159 mg/dL — ABNORMAL HIGH (ref 70–99)
Glucose-Capillary: 175 mg/dL — ABNORMAL HIGH (ref 70–99)
Glucose-Capillary: 179 mg/dL — ABNORMAL HIGH (ref 70–99)
Glucose-Capillary: 192 mg/dL — ABNORMAL HIGH (ref 70–99)
Glucose-Capillary: 88 mg/dL (ref 70–99)
Glucose-Capillary: 96 mg/dL (ref 70–99)
Glucose-Capillary: 96 mg/dL (ref 70–99)

## 2011-01-30 LAB — PREPARE CRYOPRECIPITATE

## 2011-01-30 LAB — MAGNESIUM
Magnesium: 2 mg/dL (ref 1.5–2.5)
Magnesium: 2.2 mg/dL (ref 1.5–2.5)

## 2011-01-30 LAB — COMPREHENSIVE METABOLIC PANEL
ALT: 17 U/L (ref 0–35)
Alkaline Phosphatase: 37 U/L — ABNORMAL LOW (ref 39–117)
BUN: 12 mg/dL (ref 6–23)
Calcium: 8.7 mg/dL (ref 8.4–10.5)
Glucose, Bld: 138 mg/dL — ABNORMAL HIGH (ref 70–99)
Glucose, Bld: 210 mg/dL — ABNORMAL HIGH (ref 70–99)
Potassium: 3.1 mEq/L — ABNORMAL LOW (ref 3.5–5.1)
Sodium: 138 mEq/L (ref 135–145)
Total Bilirubin: 1.4 mg/dL — ABNORMAL HIGH (ref 0.3–1.2)
Total Protein: 4.9 g/dL — ABNORMAL LOW (ref 6.0–8.3)
Total Protein: 5.9 g/dL — ABNORMAL LOW (ref 6.0–8.3)

## 2011-01-30 LAB — PREPARE PLATELETS

## 2011-01-30 LAB — CREATININE, SERUM
Creatinine, Ser: 1.39 mg/dL — ABNORMAL HIGH (ref 0.4–1.2)
GFR calc non Af Amer: 37 mL/min — ABNORMAL LOW (ref >60)

## 2011-01-30 LAB — PREPARE FRESH FROZEN PLASMA

## 2011-01-30 LAB — HEMOGLOBIN A1C
Hgb A1c MFr Bld: 6.3 % — ABNORMAL HIGH (ref <5.7)
Mean Plasma Glucose: 134 mg/dL — ABNORMAL HIGH (ref <117)

## 2011-01-30 LAB — MRSA PCR SCREENING: MRSA by PCR: POSITIVE — AB

## 2011-01-30 LAB — D-DIMER, QUANTITATIVE: D-Dimer, Quant: 10.42 ug/mL-FEU — ABNORMAL HIGH (ref 0.00–0.48)

## 2011-01-30 LAB — ABO/RH: ABO/RH(D): A POS

## 2011-01-30 LAB — CARDIAC PANEL(CRET KIN+CKTOT+MB+TROPI)
CK, MB: 1.4 ng/mL (ref 0.3–4.0)
Relative Index: 1.4 (ref 0.0–2.5)
Troponin I: 0.02 ng/mL (ref 0.00–0.06)

## 2011-01-30 LAB — PLATELET COUNT: Platelets: 75 10*3/uL — ABNORMAL LOW (ref 150–400)

## 2011-01-30 LAB — APTT: aPTT: 37 seconds (ref 24–37)

## 2011-02-01 ENCOUNTER — Encounter (HOSPITAL_COMMUNITY): Payer: Medicare Other

## 2011-02-02 NOTE — Progress Notes (Signed)
Summary: feet swelling  Phone Note Call from Patient Call back at Home Phone [phone]   Caller: Patient Reason for Call: Talk to Nurse Summary of Call: pt states she having swelling in her feet. pt would like to speak with a nurse. Initial call taken by: Roe Coombs,  January 21, 2011 9:20 AM  Follow-up for Phone Call        Pt due to see Dr Excell Seltzer at the end of April (need to schedule appt).  Left message on home and cell phone for pt to call back. Julieta Gutting, RN, BSN  January 21, 2011 5:53 PM   I spoke with the pt and she said that Dr Excell Seltzer had spoken with her in the lobby and mentioned changing her medications if she continued having problems with swelling.  The pt's swelling has worsened since she spoke with Dr Excell Seltzer.  The pt is now  getting up in the morning with some swelling in her lower extremities.  The pt takes her metolazone on Thursday morning, not Wednesday.  This morning the pt has not noticed a lot of change in swelling and her weight is unchanged from yesterday (150 lb).  I instructed the pt to increase her Furosemide to 120mg  two times a day for the next 2-3 days.  The pt will call the office on Monday to let us know if swelling has improved. The pt denies any SOB at this time.  I also reminded pt to watch her salt intake and elevate her feet when able. Pt agreed with plan.   Follow-up by: Julieta Gutting, RN, BSN,  January 22, 2011 8:56 AM  Additional Follow-up for Phone Call Additional follow up Details #1::        The pt also called back to let me know that she has been having nose bleeds for the past 2 weeks.  These can be daily or every other day.  I recommended that the pt use saline nasal spray as needed.  The pt has seen Dr Dorma Russell in the past and I made her aware that she may need to be seen again for nosebleeds.  Julieta Gutting, RN, BSN  January 22, 2011 9:16 AM    Additional Follow-up for Phone Call Additional follow up Details #2::    agreed Follow-up by: Norva Karvonen, MD,  January 22, 2011 9:35 AM  Additional Follow-up for Phone Call Additional follow up Details #3:: Details for Additional Follow-up Action Taken: I spoke with the pt and her feet are slightly improved since last week. The pt is still concerned because the swelling has not resolved.  The pt's weight remains the same at 150 lbs.  The pt denies SOB.  I will speak with Dr Excell Seltzer about the pt's symptoms.  Julieta Gutting, RN, BSN  January 25, 2011 9:49 AM  I spoke with Dr Excell Seltzer and he would like the pt to start taking Metolazone on Monday and Thursday. The pt should increase Potassium to three tablets daily on Monday and Thursday.  Go back to Furosemide 80mg  two times a day for regular dose. Refer to Dr Dorma Russell. Left message for pt to call back. Julieta Gutting, RN, BSN  January 25, 2011 12:30 PM  pt returning your call. pt states she home now. Kamiyah Kindel  January 25, 2011 2:56 PM   Pt aware of new medication instructions.  The pt is currently having a continuous nosebleed that started last night.  I instructed  the pt that she should be seen in the ER for nosebleed.  The pt does not want to go to Urgent Care or ER.  The pt would like to see Dr Narda Bonds if possible.  I scheduled the pt to see Dr Ezzard Standing tomorrow at 2:15.  Pt aware of appt and agrees to go to the ER tonight if she cannot get nosebleed to stop.    Additional Follow-up by: Julieta Gutting, RN, BSN,  January 25, 2011 4:13 PM  New/Updated Medications: POTASSIUM CHLORIDE CRYS CR 20 MEQ CR-TABS (POTASSIUM CHLORIDE CRYS CR) take two tablets by mouth once a day except on Monday and Thursday  take three tablets daily METOLAZONE 2.5 MG TABS (METOLAZONE) take one tablet by mouth 30 minutes prior to Furosemide dose every Monday and Thursday morning

## 2011-02-03 ENCOUNTER — Other Ambulatory Visit: Payer: Self-pay | Admitting: Dermatology

## 2011-02-03 ENCOUNTER — Encounter (HOSPITAL_COMMUNITY): Payer: Medicare Other

## 2011-02-05 ENCOUNTER — Encounter (HOSPITAL_COMMUNITY): Payer: Medicare Other

## 2011-02-08 ENCOUNTER — Encounter (HOSPITAL_COMMUNITY): Payer: Medicare Other

## 2011-02-08 ENCOUNTER — Telehealth: Payer: Self-pay | Admitting: Cardiovascular Disease

## 2011-02-08 NOTE — Telephone Encounter (Signed)
metopralazone 2.5 mg [facility]

## 2011-02-09 ENCOUNTER — Other Ambulatory Visit: Payer: Self-pay | Admitting: Cardiovascular Disease

## 2011-02-09 DIAGNOSIS — R609 Edema, unspecified: Secondary | ICD-10-CM

## 2011-02-09 MED ORDER — METOLAZONE 2.5 MG PO TABS: 2.5000 mg | ORAL_TABLET | Freq: Every day | ORAL | Status: DC

## 2011-02-10 ENCOUNTER — Encounter (HOSPITAL_COMMUNITY): Payer: Medicare Other

## 2011-02-11 ENCOUNTER — Encounter: Payer: Medicare Other | Admitting: *Deleted

## 2011-02-11 ENCOUNTER — Ambulatory Visit (INDEPENDENT_AMBULATORY_CARE_PROVIDER_SITE_OTHER): Payer: Medicare Other | Admitting: *Deleted

## 2011-02-11 DIAGNOSIS — I4891 Unspecified atrial fibrillation: Secondary | ICD-10-CM

## 2011-02-11 DIAGNOSIS — Z8679 Personal history of other diseases of the circulatory system: Secondary | ICD-10-CM

## 2011-02-11 DIAGNOSIS — Z7901 Long term (current) use of anticoagulants: Secondary | ICD-10-CM

## 2011-02-11 LAB — POCT INR: INR: 2

## 2011-02-11 NOTE — Patient Instructions (Signed)
INR 2.0  Take 1 1/2 tablets today then resume same dose of 1 tablet every day.  Recheck INR in 4 weeks.

## 2011-02-12 ENCOUNTER — Encounter (HOSPITAL_COMMUNITY): Payer: Medicare Other

## 2011-02-12 ENCOUNTER — Telehealth: Payer: Self-pay | Admitting: Cardiovascular Disease

## 2011-02-12 DIAGNOSIS — R609 Edema, unspecified: Secondary | ICD-10-CM

## 2011-02-12 MED ORDER — METOLAZONE 2.5 MG PO TABS: ORAL_TABLET | ORAL | Status: DC

## 2011-02-12 NOTE — Telephone Encounter (Signed)
Medication refill for metolazone sent to [facility] pharmacy. Vikki Ports

## 2011-02-15 ENCOUNTER — Telehealth: Payer: Self-pay | Admitting: Cardiovascular Disease

## 2011-02-15 ENCOUNTER — Encounter (HOSPITAL_COMMUNITY): Payer: No Typology Code available for payment source

## 2011-02-15 MED ORDER — METOPROLOL TARTRATE 50 MG PO TABS: 50.0000 mg | ORAL_TABLET | Freq: Two times a day (BID) | ORAL | Status: DC

## 2011-02-15 NOTE — Telephone Encounter (Signed)
Medication refill sent to [facility] pharmacy. Vikki Ports

## 2011-02-17 ENCOUNTER — Encounter (HOSPITAL_COMMUNITY): Payer: No Typology Code available for payment source

## 2011-02-18 ENCOUNTER — Other Ambulatory Visit: Payer: Self-pay | Admitting: Cardiovascular Disease

## 2011-02-18 ENCOUNTER — Telehealth: Payer: Self-pay | Admitting: Cardiovascular Disease

## 2011-02-18 ENCOUNTER — Telehealth: Payer: Self-pay | Admitting: Cardiology

## 2011-02-18 DIAGNOSIS — R609 Edema, unspecified: Secondary | ICD-10-CM

## 2011-02-18 MED ORDER — METOLAZONE 2.5 MG PO TABS: ORAL_TABLET | ORAL | Status: DC

## 2011-02-18 NOTE — Telephone Encounter (Signed)
Pt has question re meds. °

## 2011-02-18 NOTE — Telephone Encounter (Signed)
Left message for pt to call back  °

## 2011-02-18 NOTE — Telephone Encounter (Signed)
Please see new telephone call encounter that was opened when pt called back.

## 2011-02-18 NOTE — Telephone Encounter (Signed)
Pt rtn call 

## 2011-02-18 NOTE — Telephone Encounter (Signed)
I spoke with the pt and she needs an updated Rx for Metolazone sent into [facility] on [address].

## 2011-02-19 ENCOUNTER — Other Ambulatory Visit: Payer: Self-pay | Admitting: *Deleted

## 2011-02-19 ENCOUNTER — Encounter (HOSPITAL_COMMUNITY): Payer: No Typology Code available for payment source

## 2011-02-19 ENCOUNTER — Other Ambulatory Visit: Payer: Self-pay | Admitting: Thoracic Surgery (Cardiothoracic Vascular Surgery)

## 2011-02-19 DIAGNOSIS — R609 Edema, unspecified: Secondary | ICD-10-CM

## 2011-02-22 ENCOUNTER — Encounter (HOSPITAL_COMMUNITY): Payer: No Typology Code available for payment source

## 2011-02-24 ENCOUNTER — Encounter (HOSPITAL_COMMUNITY): Payer: No Typology Code available for payment source

## 2011-02-26 ENCOUNTER — Encounter (HOSPITAL_COMMUNITY): Payer: No Typology Code available for payment source

## 2011-03-01 ENCOUNTER — Encounter (HOSPITAL_COMMUNITY): Payer: No Typology Code available for payment source

## 2011-03-01 ENCOUNTER — Other Ambulatory Visit: Payer: Self-pay | Admitting: Pulmonary Disease

## 2011-03-01 DIAGNOSIS — Z1231 Encounter for screening mammogram for malignant neoplasm of breast: Secondary | ICD-10-CM

## 2011-03-03 ENCOUNTER — Telehealth: Payer: Self-pay | Admitting: Cardiovascular Disease

## 2011-03-03 ENCOUNTER — Encounter (HOSPITAL_COMMUNITY): Payer: No Typology Code available for payment source

## 2011-03-03 NOTE — Telephone Encounter (Signed)
I spoke with the pt and tried to arrange appointment on 03/09/11 but the pt said this would not work with her schedule. I scheduled the pt to see Dr Excell Seltzer on 03/11/11. Pt aware of appointment.

## 2011-03-03 NOTE — Telephone Encounter (Signed)
Pt having sweeling in her legs and feet. Pt would like to talk to lauren.

## 2011-03-03 NOTE — Telephone Encounter (Signed)
I spoke with the pt and she is still having swelling in her legs.  The pt said she has not noticed very much improvement in symptoms since increasing her Zaroxolyn. The pt feels like she may have some mild SOB. The pt's weight has been consistent at 150 lbs.  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  This is followed by Dr Aldean Ast and the pt has a scheduled appointment in MAY. The pt also has not been wearing her compression stockings for a while.  I made the pt aware that she needs to start wearing these on a daily basis.  I will make Dr Excell Seltzer aware of the pt's symptoms and notify her with further recommendations.   The pt also has been staggering over the past few days and feels like her legs are giving out.  The pt thinks this is related to her Crestor.  I made the pt aware that she could hold Crestor for 10-14 days to see if symptoms improve.

## 2011-03-03 NOTE — Telephone Encounter (Signed)
Discussed with Dr Excell Seltzer and he would like to see the pt in the office.

## 2011-03-05 ENCOUNTER — Encounter (HOSPITAL_COMMUNITY): Payer: No Typology Code available for payment source

## 2011-03-08 ENCOUNTER — Emergency Department (HOSPITAL_COMMUNITY): Payer: Medicare Other

## 2011-03-08 ENCOUNTER — Encounter (HOSPITAL_COMMUNITY): Payer: No Typology Code available for payment source

## 2011-03-08 ENCOUNTER — Telehealth: Payer: Self-pay

## 2011-03-08 ENCOUNTER — Inpatient Hospital Stay (HOSPITAL_COMMUNITY)
Admission: EM | Admit: 2011-03-08 | Discharge: 2011-03-12 | DRG: 293 | Disposition: A | Discharge status: Home / Self Care | Payer: Medicare Other | Attending: Internal Medicine | Admitting: Internal Medicine

## 2011-03-08 DIAGNOSIS — I509 Heart failure, unspecified: Secondary | ICD-10-CM | POA: Diagnosis present

## 2011-03-08 DIAGNOSIS — I2510 Atherosclerotic heart disease of native coronary artery without angina pectoris: Secondary | ICD-10-CM | POA: Diagnosis present

## 2011-03-08 DIAGNOSIS — I079 Rheumatic tricuspid valve disease, unspecified: Secondary | ICD-10-CM | POA: Diagnosis present

## 2011-03-08 DIAGNOSIS — Z888 Allergy status to other drugs, medicaments and biological substances status: Secondary | ICD-10-CM

## 2011-03-08 DIAGNOSIS — G4733 Obstructive sleep apnea (adult) (pediatric): Secondary | ICD-10-CM | POA: Diagnosis present

## 2011-03-08 DIAGNOSIS — I872 Venous insufficiency (chronic) (peripheral): Secondary | ICD-10-CM | POA: Diagnosis present

## 2011-03-08 DIAGNOSIS — R0989 Other specified symptoms and signs involving the circulatory and respiratory systems: Secondary | ICD-10-CM

## 2011-03-08 DIAGNOSIS — R609 Edema, unspecified: Secondary | ICD-10-CM

## 2011-03-08 DIAGNOSIS — G609 Hereditary and idiopathic neuropathy, unspecified: Secondary | ICD-10-CM | POA: Diagnosis present

## 2011-03-08 DIAGNOSIS — K589 Irritable bowel syndrome without diarrhea: Secondary | ICD-10-CM | POA: Diagnosis present

## 2011-03-08 DIAGNOSIS — R0609 Other forms of dyspnea: Secondary | ICD-10-CM

## 2011-03-08 DIAGNOSIS — E785 Hyperlipidemia, unspecified: Secondary | ICD-10-CM | POA: Diagnosis present

## 2011-03-08 DIAGNOSIS — Z79899 Other long term (current) drug therapy: Secondary | ICD-10-CM

## 2011-03-08 DIAGNOSIS — I4891 Unspecified atrial fibrillation: Secondary | ICD-10-CM | POA: Diagnosis present

## 2011-03-08 DIAGNOSIS — M549 Dorsalgia, unspecified: Secondary | ICD-10-CM | POA: Diagnosis present

## 2011-03-08 DIAGNOSIS — Z7901 Long term (current) use of anticoagulants: Secondary | ICD-10-CM

## 2011-03-08 DIAGNOSIS — F341 Dysthymic disorder: Secondary | ICD-10-CM | POA: Diagnosis present

## 2011-03-08 DIAGNOSIS — Z7982 Long term (current) use of aspirin: Secondary | ICD-10-CM

## 2011-03-08 DIAGNOSIS — D696 Thrombocytopenia, unspecified: Secondary | ICD-10-CM | POA: Diagnosis present

## 2011-03-08 DIAGNOSIS — I2789 Other specified pulmonary heart diseases: Secondary | ICD-10-CM | POA: Diagnosis present

## 2011-03-08 DIAGNOSIS — I5033 Acute on chronic diastolic (congestive) heart failure: Principal | ICD-10-CM | POA: Diagnosis present

## 2011-03-08 DIAGNOSIS — I059 Rheumatic mitral valve disease, unspecified: Secondary | ICD-10-CM | POA: Diagnosis present

## 2011-03-08 DIAGNOSIS — Z951 Presence of aortocoronary bypass graft: Secondary | ICD-10-CM

## 2011-03-08 DIAGNOSIS — G8929 Other chronic pain: Secondary | ICD-10-CM | POA: Diagnosis present

## 2011-03-08 DIAGNOSIS — K219 Gastro-esophageal reflux disease without esophagitis: Secondary | ICD-10-CM | POA: Diagnosis present

## 2011-03-08 DIAGNOSIS — Z8673 Personal history of transient ischemic attack (TIA), and cerebral infarction without residual deficits: Secondary | ICD-10-CM

## 2011-03-08 LAB — DIFFERENTIAL
Basophils Relative: 1 % (ref 0–1)
Eosinophils Absolute: 0.1 10*3/uL (ref 0.0–0.7)
Lymphs Abs: 0.7 10*3/uL (ref 0.7–4.0)
Neutro Abs: 3 10*3/uL (ref 1.7–7.7)
Neutrophils Relative %: 74 % (ref 43–77)

## 2011-03-08 LAB — CBC
Hemoglobin: 10.5 g/dL — ABNORMAL LOW (ref 12.0–15.0)
Platelets: 119 10*3/uL — ABNORMAL LOW (ref 150–400)
RBC: 3.49 MIL/uL — ABNORMAL LOW (ref 3.87–5.11)
WBC: 4.1 10*3/uL (ref 4.0–10.5)

## 2011-03-08 LAB — POCT I-STAT, CHEM 8
Calcium, Ion: 1.05 mmol/L — ABNORMAL LOW (ref 1.12–1.32)
Chloride: 100 mEq/L (ref 96–112)
HCT: 34 % — ABNORMAL LOW (ref 36.0–46.0)
Sodium: 140 mEq/L (ref 135–145)

## 2011-03-08 LAB — CK TOTAL AND CKMB (NOT AT ARMC)
CK, MB: 2 ng/mL (ref 0.3–4.0)
Total CK: 125 U/L (ref 7–177)

## 2011-03-08 LAB — TSH: TSH: 3.823 u[IU]/mL (ref 0.350–4.500)

## 2011-03-08 LAB — SEDIMENTATION RATE: Sed Rate: 15 mm/hr (ref 0–22)

## 2011-03-08 LAB — BRAIN NATRIURETIC PEPTIDE: Pro B Natriuretic peptide (BNP): 722 pg/mL — ABNORMAL HIGH (ref 0.0–100.0)

## 2011-03-08 LAB — PROTIME-INR: Prothrombin Time: 19.8 seconds — ABNORMAL HIGH (ref 11.6–15.2)

## 2011-03-08 NOTE — Telephone Encounter (Signed)
I actually spoke with this pt earlier today about her husband and his ER visit over the weekend for Vertigo.  She called to let me know that she almost passed out walking into the building where she does her walking trail. The pt said she also had another spell last night where she almost passed out but forgot to mention this during our earlier conversation.  The pt said she just feels weak, legs are giving out and she continues to have problems with swelling and SOB.  The pt is scheduled to see Dr Excell Seltzer on 03/11/11.  At this time due to her recent episodes of pre-syncope I recommended that the pt go to the [facility] ER for evaluation.  The pt's husband is driving at this time. Pt agreed with plan. Trish notified.

## 2011-03-09 ENCOUNTER — Ambulatory Visit: Payer: Medicare Other | Admitting: Cardiovascular Disease

## 2011-03-09 DIAGNOSIS — I5033 Acute on chronic diastolic (congestive) heart failure: Secondary | ICD-10-CM

## 2011-03-09 LAB — CARDIAC PANEL(CRET KIN+CKTOT+MB+TROPI)
CK, MB: 1.3 ng/mL (ref 0.3–4.0)
Relative Index: INVALID (ref 0.0–2.5)
Total CK: 105 U/L (ref 7–177)
Troponin I: 0.03 ng/mL (ref 0.00–0.06)

## 2011-03-09 LAB — CBC
Hemoglobin: 9.9 g/dL — ABNORMAL LOW (ref 12.0–15.0)
MCHC: 31.3 g/dL (ref 30.0–36.0)
Platelets: 109 10*3/uL — ABNORMAL LOW (ref 150–400)

## 2011-03-09 LAB — BASIC METABOLIC PANEL
BUN: 23 mg/dL (ref 6–23)
CO2: 31 mEq/L (ref 19–32)
Calcium: 9.2 mg/dL (ref 8.4–10.5)
GFR calc non Af Amer: 50 mL/min — ABNORMAL LOW (ref >60)
Glucose, Bld: 132 mg/dL — ABNORMAL HIGH (ref 70–99)
Sodium: 137 mEq/L (ref 135–145)

## 2011-03-09 LAB — PROTIME-INR: Prothrombin Time: 20.2 seconds — ABNORMAL HIGH (ref 11.6–15.2)

## 2011-03-09 LAB — COMPREHENSIVE METABOLIC PANEL
ALT: 12 U/L (ref 0–35)
AST: 24 U/L (ref 0–37)
Alkaline Phosphatase: 51 U/L (ref 39–117)
CO2: 32 mEq/L (ref 19–32)
Chloride: 100 mEq/L (ref 96–112)
GFR calc Af Amer: >60 mL/min (ref >60)
GFR calc non Af Amer: >60 mL/min (ref >60)
Glucose, Bld: 107 mg/dL — ABNORMAL HIGH (ref 70–99)
Potassium: 3.4 mEq/L — ABNORMAL LOW (ref 3.5–5.1)
Sodium: 140 mEq/L (ref 135–145)
Total Bilirubin: 1.1 mg/dL (ref 0.3–1.2)

## 2011-03-09 LAB — HEPARIN LEVEL (UNFRACTIONATED)
Heparin Unfractionated: 0.15 IU/mL — ABNORMAL LOW (ref 0.30–0.70)
Heparin Unfractionated: 0.29 IU/mL — ABNORMAL LOW (ref 0.30–0.70)

## 2011-03-09 LAB — LIPID PANEL: Cholesterol: 113 mg/dL (ref 0–200)

## 2011-03-09 LAB — MRSA PCR SCREENING: MRSA by PCR: POSITIVE — AB

## 2011-03-10 ENCOUNTER — Encounter (HOSPITAL_COMMUNITY): Payer: No Typology Code available for payment source

## 2011-03-10 ENCOUNTER — Encounter: Payer: Self-pay | Admitting: *Deleted

## 2011-03-10 ENCOUNTER — Encounter: Payer: Self-pay | Admitting: Cardiovascular Disease

## 2011-03-10 DIAGNOSIS — I369 Nonrheumatic tricuspid valve disorder, unspecified: Secondary | ICD-10-CM

## 2011-03-10 LAB — BASIC METABOLIC PANEL
CO2: 31 mEq/L (ref 19–32)
Calcium: 9 mg/dL (ref 8.4–10.5)
Creatinine, Ser: 0.91 mg/dL (ref 0.4–1.2)
GFR calc Af Amer: >60 mL/min (ref >60)
GFR calc non Af Amer: >60 mL/min (ref >60)
Glucose, Bld: 115 mg/dL — ABNORMAL HIGH (ref 70–99)
Sodium: 139 mEq/L (ref 135–145)

## 2011-03-10 LAB — CBC
HCT: 30.6 % — ABNORMAL LOW (ref 36.0–46.0)
Hemoglobin: 9.7 g/dL — ABNORMAL LOW (ref 12.0–15.0)
MCH: 29.8 pg (ref 26.0–34.0)
MCHC: 31.7 g/dL (ref 30.0–36.0)
RDW: 13.9 % (ref 11.5–15.5)

## 2011-03-10 LAB — HEPARIN LEVEL (UNFRACTIONATED)
Heparin Unfractionated: 0.1 IU/mL — ABNORMAL LOW (ref 0.30–0.70)
Heparin Unfractionated: 0.45 IU/mL (ref 0.30–0.70)

## 2011-03-11 ENCOUNTER — Encounter: Payer: Medicare Other | Admitting: *Deleted

## 2011-03-11 ENCOUNTER — Ambulatory Visit: Payer: Medicare Other | Admitting: Cardiovascular Disease

## 2011-03-11 DIAGNOSIS — I5033 Acute on chronic diastolic (congestive) heart failure: Secondary | ICD-10-CM

## 2011-03-11 LAB — BRAIN NATRIURETIC PEPTIDE: Pro B Natriuretic peptide (BNP): 311 pg/mL — ABNORMAL HIGH (ref 0.0–100.0)

## 2011-03-11 LAB — BASIC METABOLIC PANEL
BUN: 19 mg/dL (ref 6–23)
Chloride: 98 mEq/L (ref 96–112)
Creatinine, Ser: 0.92 mg/dL (ref 0.4–1.2)
GFR calc Af Amer: >60 mL/min (ref >60)
GFR calc non Af Amer: 60 mL/min — ABNORMAL LOW (ref >60)

## 2011-03-11 LAB — CBC
MCV: 94 fL (ref 78.0–100.0)
Platelets: 136 10*3/uL — ABNORMAL LOW (ref 150–400)
RBC: 3.5 MIL/uL — ABNORMAL LOW (ref 3.87–5.11)
RDW: 14 % (ref 11.5–15.5)
WBC: 4.4 10*3/uL (ref 4.0–10.5)

## 2011-03-11 LAB — HEPARIN LEVEL (UNFRACTIONATED)
Heparin Unfractionated: 0.28 IU/mL — ABNORMAL LOW (ref 0.30–0.70)
Heparin Unfractionated: 0.52 IU/mL (ref 0.30–0.70)

## 2011-03-12 DIAGNOSIS — I279 Pulmonary heart disease, unspecified: Secondary | ICD-10-CM

## 2011-03-12 DIAGNOSIS — I509 Heart failure, unspecified: Secondary | ICD-10-CM

## 2011-03-12 DIAGNOSIS — I369 Nonrheumatic tricuspid valve disorder, unspecified: Secondary | ICD-10-CM

## 2011-03-12 LAB — CBC
Platelets: 127 10*3/uL — ABNORMAL LOW (ref 150–400)
RDW: 14 % (ref 11.5–15.5)
WBC: 4.1 10*3/uL (ref 4.0–10.5)

## 2011-03-12 LAB — POCT I-STAT 3, VENOUS BLOOD GAS (G3P V)
pCO2, Ven: 49.7 mmHg (ref 45.0–50.0)
pO2, Ven: 28 mmHg — CL (ref 30.0–45.0)

## 2011-03-12 LAB — PROTIME-INR
INR: 1.3 (ref 0.00–1.49)
Prothrombin Time: 16.4 seconds — ABNORMAL HIGH (ref 11.6–15.2)

## 2011-03-15 ENCOUNTER — Telehealth: Payer: Self-pay | Admitting: Cardiovascular Disease

## 2011-03-15 LAB — CULTURE, BLOOD (ROUTINE X 2): Culture: NO GROWTH

## 2011-03-15 NOTE — Telephone Encounter (Signed)
I spoke with the pt and she wanted to know why Digoxin was started and then stopped during her hospital admission.  Per discharge summary this medication was stopped due to Bradycardia and pauses up to 2 seconds. The pt will keep 03/29/11 [facility] appointment.

## 2011-03-15 NOTE — Telephone Encounter (Signed)
Pt has question re her meds. Pt would like to talk to a nurse.

## 2011-03-16 ENCOUNTER — Ambulatory Visit (INDEPENDENT_AMBULATORY_CARE_PROVIDER_SITE_OTHER): Payer: Medicare Other | Admitting: *Deleted

## 2011-03-16 DIAGNOSIS — Z8679 Personal history of other diseases of the circulatory system: Secondary | ICD-10-CM

## 2011-03-16 DIAGNOSIS — I4891 Unspecified atrial fibrillation: Secondary | ICD-10-CM

## 2011-03-16 LAB — POCT INR: INR: 1.6

## 2011-03-16 NOTE — H&P (Signed)
[name], [name]             ACCOUNT NO.:  [id number]  MEDICAL RECORD NO.:  [id number]           PATIENT TYPE:  I  LOCATION:  2030                         FACILITY:  [facility]  PHYSICIAN:  Bevelyn Buckles. Bensimhon, MDDATE OF BIRTH:  1934/12/20  DATE OF ADMISSION:  03/08/2011 DATE OF DISCHARGE:                             HISTORY & PHYSICAL   PRIMARY CARDIOLOGIST:  Veverly [name]. Excell Seltzer, MD  PROFILE:  A 76 year old female with prior history of paroxysmal atrial fibrillation, on chronic Coumadin and more recently to have aortic dissection, status post repair on June 06, 2011 who presents with a 2- week history of waking lower extremity edema and 1 week history progressive dyspnea on exertion and weakness.  PROBLEMS: 1. Acute congestive heart failure.     a.     On July 15, 2011, 2-D echocardiogram; ejection fraction 55-      65%, moderate mitral regurgitation, severe tricuspid      regurgitation.2. Moderate mitral regurgitation.     a.     TEE in July 2011 showed only trivial mitral regurgitation. 3. Severe tricuspid regurgitation. 4. Paroxysmal atrial fibrillation.     a.     Status post cardioversion in 1982 and 1993.     b.     Chronic Coumadin. 5. Atrial septal defect.     a.     Status post closure in 1992. 6. History of transient ischemic attack versus sleep paralysis in     August 2006. 7. Type A aortic dissection - acute, June 05, 2010.     a.     Status post resuspension of native aortic valve and coronary      artery bypass graft x2 with placement of a vein graft to the      distal left anterior descending artery and vein graft to the      obtuse marginal.  Of note, the patient had nonobstructive disease      on catheterization at that time. 8. Obstructive sleep apnea. 9. Status post right rotator cuff repair in 2007. 10.Status post vaginal hysterectomy in 1987. 11.Status post ovarian surgery in 1993. 12.Status post bilateral tubal ligation in 1969. 13.Peripheral  neuropathy. 14.Chronic venous insufficiency with lower extremity edema. 15.Hyperlipidemia. 16.Gastroesophageal reflux disease. 17.Irritable bowel syndrome. 18.Degenerative joint disease and chronic back pain. 19.Fibromyalgia. 20.Depression and anxiety.  ALLERGIES: 1. BACITRACIN. 2. CODEINE. 3. DEMEROL. 4. LIPITOR. 5. NEOMYCIN. 6. ZOCOR. 7. MORPHINE. 8. NEOSPORIN. 9. REQUIP. 10.CRESTOR (though the patient takes).  HISTORY OF PRESENT ILLNESS:  A 76 year old female with history of paroxysmal atrial fibrillation, on chronic Coumadin therapy, who in July 2011 suffered acute chest pain with question of ST-segment elevation on EKG.  A Code STEMI was called, the patient underwent diagnostic catheterization revealing nonobstructive CAD and type A aortic dissection.  The patient went to Surgery emergently and underwent repair of the dissection with resuspension of the native aortic valve and also CABG x3 with placement of a vein graft to the distal LAD and vein graft to the obtuse marginal.  Since surgery, the patient has been was working in cardiac rehab and then was really been doing quite well, walking  one mile four to five times per week without significant limitations.  Over the past 2 weeks, however, the patient has noted weight gain of somewhat between 7 and 15 pounds and starting about a week ago, she began to have episodes of exertional dyspnea associated with profound weakness.  She is also noted worsening lower extremity edema.  She has been taking Lasix 80 mg b.i.d. and increase her metolazone to twice weekly, then initially saw some effect last week, but fluid has reaccumulated. Yesterday, she had two more episodes of extreme weakness with dyspnea and then another episode this morning prompting her to present to the ED.  She has not had any chest pain with any of these episodes.  HOME MEDICATIONS: 1. Aspirin 81 mg daily. 2. Coumadin daily. 3. Lasix 80 mg b.i.d. 4.  Metolazone 2.5 mg twice a week. 5. Lopressor 50 mg b.i.d. 6. Ambien 10 mg nightly. 7. Crestor 10 mg half a tablet, which she stopped last week. 8. Gabapentin 300 mg b.i.d. 9. Potassium chloride 20 mEq two tablets daily except for on days when     she takes metolazone when she takes three tablets. 10.Prilosec 20 mg b.i.d. 11.Rhinocort as needed. 12.Tramadol 50 mg three times a day p.r.n. 13.Zoloft 50 mg daily. 14.Ocuvite one tablet b.i.d. 15.Colace 100 mg nightly. 16.Vitamin B1 1000 units daily. 17.Glucosamine and chondroitin b.i.d. 18.Bactroban cream to her legs b.i.d.  FAMILY HISTORY:  Mother died with a GERD disease.  Father died with vascular problems following A motor vehicle accident.  She has six brothers and sisters of which only one brother is living, multiple siblings have a history of coronary artery disease and cancer.  SOCIAL HISTORY:  The patient lives in [place] with her husband.  She denies tobacco, alcohol, or drug use.  She is walking one mile four to five times per week.  REVIEW OF SYSTEMS:  Notable for chronic three-pillow orthopnea.  She has had left greater than right lower extremity edema for the past 2 weeks. She has been experiencing presyncope in the setting of dyspnea on exertion and profound weakness.  She has not had any chest pain.  She is a full code.  Otherwise, all systems were reviewed and negative.  PHYSICAL EXAMINATION:  VITAL SIGNS:  Temperature 97.5, heart rate 70, respirations 18, blood pressure 151/75, pulse ox 100% on room air. GENERAL:  Pleasant white female in no acute distress.  Awake, alert and oriented x3. PSYCHIATRY:  She has normal affect. HEENT:  Normal. NEUROLOGIC:  Grossly intact and nonfocal. SKIN:  Warm and dry without lesions or masses. NECK:  Supple without bruises with markedly elevated JVP at least to the jaw. LUNGS:  Respirations are regular and unlabored with diminished breath sounds at the bases. CARDIAC:   Irregular S1 and S2, positive S3.  She has a 3/6 systolic murmur at the apex and 2/6 at the left lower sternal border.  RV heave is noted. ABDOMEN:  Round, soft, nontender, and nondistended.  Bowel sounds present x4. EXTREMITIES:  Warm, dry, and pink.  No clubbing, cyanosis, and 2+ bilateral extremity edema to the knees.  Chest x-ray is pending.  EKG shows sinus rhythm, PACs, rate is 63, normal axis, inferolateral flat ST depression, not significantly changed from old ECGs.  Hemoglobin 10.5, hematocrit 33.2, WBC 4.1, platelets 119.  Sodium 140, potassium 3.6, chloride 100, CO2 28, BUN 31, creatinine 1.0, glucose 105, INR 1.66.  Cardiac enzymes and BNP are pending.  ASSESSMENT AND PLAN: 1. Acute congestive heart  failure, likely biventricular.  The patient     with 2-week history of increasing right lower extremity edema,     dyspnea on exertion associated with presyncope and profound     weakness.  She has evidence of marked volume overload on exam.  Her     last echocardiogram in August 2011 showed normal left ventricular     function with moderate mitral regurgitation (trace by TEE in July     2011) and severe tricuspid regurgitation.  Plan to repeat 2-D     echocardiogram to help determine etiology of volume overload.  Plan     to diurese.  Follow up chest x-ray and BNP.  We will also check     cardiac markers.  May need right left and heart cardiac     catheterizations. 2. Paroxysmal atrial fibrillation.  The patient is in sinus rhythm     with frequent premature atrial contractions,     continue Coumadin and beta-blocker. 3. Hyperlipidemia, Crestor was stopped last week secondary to     weakness, though it is more clear that her weakness is more likely     related to heart failure at this time.  We will resume.     Nicolasa Ducking, ANP   ______________________________ Bevelyn Buckles. Bensimhon, MD    CB/MEDQ  D:  03/08/2011  T:  03/09/2011  Job:  [id number]  Electronically  Signed by Nicolasa Ducking ANP on 03/13/2011 04:09:23 PM Electronically Signed by Arvilla Meres MD on 03/16/2011 07:19:54 PM

## 2011-03-22 ENCOUNTER — Other Ambulatory Visit: Payer: Medicare Other

## 2011-03-22 ENCOUNTER — Encounter: Payer: Medicare Other | Admitting: Thoracic Surgery (Cardiothoracic Vascular Surgery)

## 2011-03-25 ENCOUNTER — Encounter: Payer: Medicare Other | Admitting: Physician Assistant

## 2011-03-26 ENCOUNTER — Encounter: Payer: Medicare Other | Admitting: Physician Assistant

## 2011-03-29 ENCOUNTER — Ambulatory Visit
Admission: RE | Admit: 2011-03-29 | Discharge: 2011-03-29 | Disposition: A | Discharge status: Home / Self Care | Payer: Medicare Other | Source: Ambulatory Visit | Attending: Thoracic Surgery (Cardiothoracic Vascular Surgery) | Admitting: Thoracic Surgery (Cardiothoracic Vascular Surgery)

## 2011-03-29 ENCOUNTER — Ambulatory Visit (INDEPENDENT_AMBULATORY_CARE_PROVIDER_SITE_OTHER): Payer: Medicare Other | Admitting: *Deleted

## 2011-03-29 ENCOUNTER — Encounter: Payer: Self-pay | Admitting: Physician Assistant

## 2011-03-29 ENCOUNTER — Ambulatory Visit (INDEPENDENT_AMBULATORY_CARE_PROVIDER_SITE_OTHER): Payer: Medicare Other | Admitting: Physician Assistant

## 2011-03-29 ENCOUNTER — Encounter (INDEPENDENT_AMBULATORY_CARE_PROVIDER_SITE_OTHER): Payer: Medicare Other | Admitting: Thoracic Surgery (Cardiothoracic Vascular Surgery)

## 2011-03-29 VITALS — BP 108/60 | HR 65 | Ht 67.0 in | Wt 147.0 lb

## 2011-03-29 DIAGNOSIS — I4891 Unspecified atrial fibrillation: Secondary | ICD-10-CM

## 2011-03-29 DIAGNOSIS — I10 Essential (primary) hypertension: Secondary | ICD-10-CM

## 2011-03-29 DIAGNOSIS — Z8679 Personal history of other diseases of the circulatory system: Secondary | ICD-10-CM

## 2011-03-29 DIAGNOSIS — I7101 Dissection of thoracic aorta: Secondary | ICD-10-CM

## 2011-03-29 DIAGNOSIS — I50810 Right heart failure, unspecified: Secondary | ICD-10-CM

## 2011-03-29 DIAGNOSIS — E7800 Pure hypercholesterolemia, unspecified: Secondary | ICD-10-CM

## 2011-03-29 DIAGNOSIS — I509 Heart failure, unspecified: Secondary | ICD-10-CM

## 2011-03-29 LAB — POCT INR: INR: 2.1

## 2011-03-29 MED ORDER — IOHEXOL 350 MG/ML SOLN
100.0000 mL | Freq: Once | INTRAVENOUS | Status: AC | PRN
  Administered 2011-03-29: 100 mL via INTRAVENOUS

## 2011-03-29 NOTE — Assessment & Plan Note (Signed)
Her weight and breathing are stable.  Continue current diuresis.  She had a BMET done with her urologist recently.  We will obtain those results.  She has been instructed to weigh daily and to take extra metolazone as needed.  Follow up with Dr. Excell Seltzer in 2 months.

## 2011-03-29 NOTE — Assessment & Plan Note (Signed)
Controlled.  Continue current therapy.  

## 2011-03-29 NOTE — Assessment & Plan Note (Signed)
TC 113, TG 43, HDL 34, LDL 70 and ALT was 12 in the hospital.  Continue Crestor.

## 2011-03-29 NOTE — Progress Notes (Signed)
History of Present Illness: Primary Cardiologist: Dr. Tonny Bollman  [name] is a 76 y.o. female with a history of ASD repair at [facility] in 1982 who underwent emergent repair of a type A aortic dissection 7/11 accompanied by bypass x2 with a vein graft to the LAD and circumflex (of note she had 40% left main stenosis with mild plaque disease in the circumflex, LAD and RCA), diastolic heart failure, right-sided heart failure in the setting of tricuspid regurgitation RV dysfunction and paroxysmal atrial fibrillation on chronic Coumadin therapy.  She recently presented to [facility] 4/23[facility] with a significant weight gain and dyspnea with exertion.  She had failed adjustment in her diuretics as an outpatient.  Admission weight was 152 pounds.  She was diuresed for acute right-sided heart failure in the setting of moderate to severe tricuspid regurgitation and RV dysfunction.  She also underwent right heart catheterization.  This demonstrated no significant pulmonary hypertension.  Echocardiogram performed demonstrated normal LV function with moderate RV enlargement with moderate decreased RV systolic function moderate to severe tricuspid regurgitation.  Her discharge weight was 144 pounds.  She presents today for follow up.  She is doing well since discharge from the hospital.  She has only gained about 3 pounds by our scales.  She feels that her swelling has remained stable.  She sleeps on an incline but has done this chronically without significant change.  She denies PND.  She denies significant dyspnea with exertion.  She denies chest pain or syncope.  She has occasional palpitations.  These are chronic without significant change.  Past Medical History  Diagnosis Date  . OSA (obstructive sleep apnea)   . HTN (hypertension)   . Atrial fibrillation     paroxysmal; on coumadin  . CAD (coronary artery disease)     a. cath 7/11: LM 40%, mild plaque disease in CFX, LAD, and RCA;  b. CABG  with S-LAD and S-OM done at time of Aortic dissection repair  . Diastolic heart failure   . Dissection of aorta, thoracic     a. Type A; s/p repair 7/11 with aortic root repair and CABG x 2   . ASD (atrial septal defect)     a. s/p repair 1982 at [facility]  . Right heart failure     a. 2/2 TR and RV dysfxn;  b. echo 4/12: EF 60%, mild LVH, mild AI, mild MR, mod LAE, mod RVE with mod dec. RVSF, mod RAE, mod to severe TR, PASP 58;  c. right heart cath 4/12:  RA mean 8, RV 46/1 with mean 6, PA 45/13 with mean 26, PCWP mean 14, CO 3.68, CI 2.1 (no sig pulmon HTN)  . GERD (gastroesophageal reflux disease)   . IBS (irritable bowel syndrome)   . Esophageal stricture   . Colonic polyp   . Peripheral neuropathy   . Back pain   . Fibromyalgia   . Anxiety   . HLD (hyperlipidemia)   . Borderline diabetes   . History of TIAs   . DJD (degenerative joint disease)   . Normocytic anemia   . Thrombocytopenia     Current Outpatient Prescriptions  Medication Sig Dispense Refill  . amoxicillin (AMOXIL) 500 MG capsule Take 500 mg by mouth. Take 4 capsules 1 hour prior dental procedure       . aspirin 81 MG tablet Take 81 mg by mouth daily.        . beta carotene w/minerals (OCUVITE) tablet Take 1 tablet by  mouth 2 (two) times daily.        Jennette Banker Sodium 30-100 MG CAPS Take by mouth. [facility] BRAND LAXA-w/ STOOL SOFTNER EVERY EVENING       . Cholecalciferol (VITAMIN D) 1000 UNITS capsule Take 1,000 Units by mouth daily.        . furosemide (LASIX) 80 MG tablet Take 80 mg by mouth 2 (two) times daily.        Marland Kitchen gabapentin (NEURONTIN) 300 MG capsule Take 300 mg by mouth 2 (two) times daily.        . metolazone (ZAROXOLYN) 2.5 MG tablet Take One Tablet By Mouth 30 Minutes Prior To Furosemide Dose Every Monday and Thursday morning  90 tablet  3  . metoprolol (LOPRESSOR) 50 MG tablet Take 1 tablet (50 mg total) by mouth 2 (two) times daily.  60 tablet  6  . mupirocin (BACTROBAN) 2 % ointment       .  omeprazole (PRILOSEC) 20 MG capsule Take 20 mg by mouth 2 (two) times daily.        . potassium chloride SA (K-DUR,KLOR-CON) 20 MEQ tablet Take 20 mEq by mouth. Take 2 tablets once a day . Take an extra tablet on Mondays and Thursdays       . rosuvastatin (CRESTOR) 10 MG tablet Take 1/2 tab daily      . sertraline (ZOLOFT) 50 MG tablet Take 50 mg by mouth daily.        . traMADol (ULTRAM) 50 MG tablet Take 50 mg by mouth every 6 (six) hours as needed.        . warfarin (COUMADIN) 5 MG tablet Take by mouth as directed.        . zolpidem (AMBIEN) 10 MG tablet Take 10 mg by mouth at bedtime as needed.        Marland Kitchen DISCONTD: docusate sodium (COLACE) 100 MG capsule Take 100 mg by mouth daily.         No current facility-administered medications for this visit.   Facility-Administered Medications Ordered in Other Visits  Medication Dose Route Frequency Provider Last Rate Last Dose  . iohexol (OMNIPAQUE) 350 MG/ML injection 100 mL  100 mL Intravenous Once PRN Medication Radiologist   100 mL at 03/29/11 1123    Allergies: Allergies  Allergen Reactions  . Atorvastatin     REACTION: muscle pain  . Codeine     REACTION: itching  . Ezetimibe     REACTION: INTOL to Zetia w/ cough  . Meperidine Hcl     REACTION: dizziness  . Morphine   . Ropinirole Hydrochloride     REACTION: INTOL to Requip w/ sleep paralysis  . Simvastatin     REACTION: unable to walk--muscle pain    Vital Signs: BP 108/60  Pulse 65  Ht 5\' 7"  (1.702 m)  Wt 147 lb (66.679 kg)  BMI 23.02 kg/m2  PHYSICAL EXAM: Well nourished, well developed, in no acute distress HEENT: normal Neck: JVP 5-6 cm Cardiac:  normal S1, S2; RRR; 2/6 holosystolic murmur heard best at the Third left Intercostal space Lungs:  Decreased breath sounds at the bases bilaterally, no wheezing, rhonchi or rales Abd: soft, nontender, no hepatomegaly Ext: Trace to 1+ bilateral edema Skin: warm and dry Neuro:  CNs 2-12 intact, no focal abnormalities  noted  EKG:  Sinus rhythm, heart rate 65, normal axis, Minimal ST depression in leads 2, 3, aVF and V3 through V6; No significant change when compared to prior tracings  ASSESSMENT  AND PLAN:

## 2011-03-29 NOTE — Assessment & Plan Note (Signed)
Maintaining NSR.  She remains on coumadin.

## 2011-03-29 NOTE — Assessment & Plan Note (Signed)
She had follow up today with Dr. Cornelius Moras with a CT scan.

## 2011-03-29 NOTE — Patient Instructions (Addendum)
Weigh yourself daily.  If you gain 3 pounds in 24 hours or 5 pounds in one week, take an extra metolazone and call us.  Schedule a follow up with Dr. Excell Seltzer in 2 months 06/01/11 @ 2:15 pm.  Return sooner if you develop problems with increased swelling or shortness of breath.

## 2011-03-30 NOTE — Assessment & Plan Note (Signed)
OFFICE VISIT   [name], [name]  DOB:  March 06, 1935                                        July 06, 2010  CHART #:  [id number]   HISTORY OF PRESENT ILLNESS:  The patient returns for follow up of acute  aortic dissection status post emergency redo median sternotomy for  repair of acute type A aortic dissection with resuspension of the native  aortic valve on June 05, 2010.  She was initially discharged home on  June 16, 2010, after a relatively uncomplicated postoperative recovery.  She was readmitted to the hospital 2 days later with altered mental  status and low-grade fever and ultimately found to have an urinary tract  infection.  Urinary cultures grew Morganella morganii.  She was treated  with oral ciprofloxacin.  Prior to readmission, she was also having  considerable difficulty with anxiety and sleep deprivation.  This  improved dramatically while she was in the hospital.  During that  hospitalization, it was suggested that she might do well with a course  of inpatient rehab or temporary admission to a skilled nursing facility  for physical rehab.  The patient and her family insisted on taking her  home with home health arrangements.  By the time she was discharged from  the hospital, she was actually getting around quite well and sleeping  much better at night.  Since hospital discharge, she returned to the  emergency room on July 01, 2010, with shortness of breath.  She  apparently was thought to be in atrial fibrillation prior to her arrival  in the emergency department due to the presence of elevated pulse.  Reportedly, her pulse was normal by the time she got to the emergency  department.  She was discharged home and seen in follow up at the end of  last week in the office by Dr. Excell Seltzer.  Oral amiodarone therapy was  restarted.  Since then, the patient reports that she has decreased  sensation of tachy palpitations.  She has not been walking  much at all.  Early after discharge, home health physical therapist told her not to  walk because they were concerned that her blood pressure had dropped a  little bit with exercise.  As a result, for the last week or so she has  not been ambulating much at all.  She has been taking naps in the day  and having trouble sleeping at night.  She remains very anxious, and  according to her daughter is checking her blood pressure every 30-45  minutes.  She denies any fevers or chills.  She reports that her  breathing is improved.  She has no urinary symptoms.  The remainder of  her review of systems is unremarkable.   PHYSICAL EXAMINATION:  Notable for an elderly female who appears  somewhat weak and tired.  Blood pressure 128/84, pulse 76 and regular,  oxygen saturation 96% on room air.  Median sternotomy incision and right  subclavian incision are all healing nicely.  The sternum is stable on  palpation.  Auscultation of the chest reveals fairly clear breath sounds  with a few bibasilar crackles and diminished breath sounds at both  bases.  Cardiovascular exam demonstrates regular rate and rhythm.  No  murmurs, rubs, or gallops are noted.  The abdomen is soft, nontender.  The extremities  are warm and well perfused.  There is no lower extremity  edema.  Small incision from endoscopic vein harvest is healing nicely.  The remainder of her physical exam is noncontributory.   DIAGNOSTIC TESTS:  Chest x-ray performed at [facility]  today is reviewed and compared with previous exams.  This demonstrates  stable small bilateral pleural effusions and bibasilar atelectasis,  right greater than left.   IMPRESSION:  Overall, the patient remained stable.  It sounds as though  she may have had recurrence of paroxysmal atrial fibrillation since  hospital discharge.  She had been taken off amiodarone while she was in  the hospital last time, but she has now recently been restarted on   amiodarone by Dr. Excell Seltzer.  This seems very reasonable.  Her prothrombin  time was elevated today with an INR reportedly 4.9.  She has been  instructed by physical therapy not to be walking because of concerns  regarding her blood pressure.  I think this is a mistake and feel that  she really needs to be up and on her feet as much as possible.  I have  encouraged her and her family to continue to work on this.  The physical  therapist should not be instructing the patient not to walk without  explicit orders from the physician.  If she does have recurrence of  rapid atrial fibrillation, this will need to be brought to a physician's  attention.  This might be the only reason to consider limiting her  physical activity.  Otherwise, I suspect she will improve and more  actively allow her to be calm during the daytime.  Chronic anxiety is  definitely a problem.   PLAN:  I have strongly encouraged the patient to start working on  walking more every day.  We will try to touch base directly with the  home health physical therapist to make sure that they are not getting  mixed messages.  We have not made any changes in her current  medications.  I have reminded her to avoid any heavy lifting or  strenuous use of her arms or shoulders.  I do not think she is ready to  resume driving an automobile.  We will plan to see her back in 3 months  with a followup CT angiogram of the chest.   Salvatore Decent. Cornelius Moras, M.D.  Electronically Signed   CHO/MEDQ  D:  07/06/2010  T:  07/07/2010  Job:  [id number]   cc:   [name]. Excell Seltzer, MD  Lonzo Cloud. Kriste Basque, MD

## 2011-03-30 NOTE — Assessment & Plan Note (Signed)
[facility]                            CARDIOLOGY OFFICE NOTE   [name], [name]                    MRN:          [id number]  DATE:01/31/2008                            DOB:          09-05-1935    Chase Caller returns for followup At the [facility] Cardiology office on  January 31, 2008.  [name] is a delightful 76 year old woman with a  history of ASD repair in 1982 at [facility].  She had a large secundum ASD  that was followed since 1970 and her ASD was repaired after  catheterization demonstrated a large left-to-right shunt.  She has had  paroxysmal atrial fibrillation and right heart dilatation, but has  really done quite well from a symptomatic standpoint.  Her last  echocardiogram was an December 2007 and demonstrated normal left  ventricular size and systolic function with mild to moderate dilatation  of the right atrium and right ventricle as well as mild aortic root  dilatation.  Of note, the aortic root was not visualized well on the  study.   [name] complains of some left-sided chest pain around the left  lateral rib cage area that hurts with certain movements.  She has no  exertional chest pain or pressure.  She denies dyspnea, orthopnea, PND  or edema.  She complains of cold feet as well as redness in the feet.  She does have a good deal of pain in her legs, but this has been  attributed to neuropathy.  She has no other complaints at today's visit.   MEDICATIONS:  1. Metoprolol 50 mg b.i.d.  2. Digoxin 0.25 mg daily.  3. Coumadin as directed.  4. Quinapril 20 mg daily.  5. Potassium 20 mEq daily.  6. Multivitamin daily.  7. Prilosec OTC daily.  8. Ocuvite 2 daily.  9. Glucosamine and chondroitin b.i.d.  10.Ambien 5 mg daily.  11.Crestor 5 mg daily.  12.Triamterene/hydrochlorothiazide 37.5/25 mg daily.  13.Gabapentin 300 mg twice daily.   ALLERGIES:  Include DEMEROL and CODEINE.   PHYSICAL EXAMINATION:  She is alert  and oriented, in no acute distress.  Weight is 167, blood pressure 124/70, heart rate is 52, respiratory rate  16.  HEENT:  Normal.  NECK:  Normal carotid upstrokes without bruits.  Jugulovenous pressure  is normal.  LUNGS:  Clear to auscultation bilaterally.  HEART:  The apex is discrete and nondisplaced.  The heart is regular  rate and rhythm without murmurs or gallops.  The second heart sound is  tambouric.  ABDOMEN:  Soft and nontender.  No organomegaly.  No bruits.  EXTREMITIES:  No clubbing, cyanosis or edema.  Pedal pulses are 2+ and  equal.  The feet are cool with dependent rubor.   EKG:  Shows sinus bradycardia with a heart rate of 53 beats per minute  and LVH with diffuse repolarization change as well as ST change  consistent with digoxin.   ASSESSMENT:  1. Status post atrioseptal defect repair.  The patient remains stable.  2. Paroxysmal atrial fibrillation.  Continue anticoagulation with      Coumadin and continue digoxin and metoprolol.  3. Question of dilated aortic root.  I have asked that [name]      undergo a cardiac MR study to better assess her aortic root.  She      also has some right heart dysfunction with dilated chambers and a      cardiac MR will be a better way to assess her right heart volumes.  4. Chest pain.  Her chest pain is consistent with musculoskeletal      pain.  She is quite concerned about this and I have ordered a chest      x-ray to rule out any major bony abnormalities.  5. Leg pain with skin changes involving the feet.  She does not appear      to have any of large vessel vascular disease with good pulses in      her feet.  I would like her to undergo ankle-brachial indices as      well as toe brachial indices to look for a small vessel disease.   For followup, I would like to see Ms. Haworth back in 6 months.  We will  be in touch with her by telephone after the results of her tests are  back.     [name]. Excell Seltzer, MD   Electronically Signed    MDC/MedQ  DD: 01/31/2008  DT: 02/01/2008  Job #: [id number]   cc:   Lonzo Cloud. Kriste Basque, MD

## 2011-03-30 NOTE — Assessment & Plan Note (Signed)
[facility]ALTHCARE                            CARDIOLOGY OFFICE NOTE   Felicia, [name]                    MRN:          [id number]  DATE:10/28/2008                            DOB:          15-Sep-1935    REASON FOR VISIT:  Atrial fibrillation.   HISTORY OF PRESENT ILLNESS:  [name] is a 76 year old woman with a  history of ASD repair over 25 years ago.  She had a large secundum ASD.  She has followed for paroxysmal atrial fibrillation and right heart  dilatation.  She was hospitalized in the summer with atypical chest pain  and underwent an adenosine Myoview stress scan dated September 16, which  showed normal contractility with an LVEF of 77% and no evidence of  ischemia.  She is still having some problems with chest pain related to  eating.  She has had an esophageal stricture that has been dilated.  As  long as she eats slow, she does pretty well.  She denies dyspnea.  She  remains active and rides a stationary bike regularly.  She has had some  trouble with foot pain that is related to neuropathy.  She has no other  complaints at present.   MEDICATIONS:  1. Metoprolol 50 mg b.i.d.  2. Digoxin 0.25 mg daily.  3. Coumadin as directed.  4. Quinapril 20 mg daily.  5. Potassium chloride 20 mEq daily.  6. Multivitamin 1 daily.  7. Prilosec OTC daily.  8. Ocuvite daily.  9. Ambien 5 mg at bedtime.  10.Crestor 5 mg daily.  11.Stool softener at bedtime.  12.Gabapentin 300 mg b.i.d.  13.Triamterene/HCTZ 37.5/25 mg daily.  14.Glucosamine.  15.Chondroitin twice daily.  16.Fish oil twice daily.   ALLERGIES:  DEMEROL and CODEINE.   PHYSICAL EXAMINATION:  GENERAL:  She is alert and oriented in no acute  distress.  VITAL SIGNS:  Weight is 154 pounds, blood pressure 154/90, heart rate  51, and respiratory rate 16.  HEENT:  Normal.  NECK:  Normal carotid upstrokes.  No bruits.  JVP normal.  LUNGS:  Clear bilaterally.  HEART:  Regular rate and  rhythm.  There are no murmurs or gallops.  ABDOMEN:  Soft and nontender.  No organomegaly.  EXTREMITIES:  No clubbing, cyanosis, or edema.  Peripheral pulses are 2+  and equal throughout.   EKG shows sinus bradycardia with LVH and ST-T change consistent with  repolarization abnormality and digitalis effect.   ASSESSMENT:  1. Paroxysmal atrial fibrillation.  Doing well on chronic Coumadin.      Continue digoxin and metoprolol.  Maintaining sinus rhythm.  2. Status post atrial septal defect repair.  3. Dilated aortic root.  She underwent a cardiac MR demonstrating      aortic root measurement 4.3 x 4.3 with a normal arch and descending      thoracic aorta.  4. Hypertension.  She reports good blood pressure control at home and      at other office visits.  Continue current therapy.  Further      management based on continued readings.  5. Dyslipidemia, managed by Dr. Kriste Basque.  For followup, I would like to see [name] in 6 months.     [name]. Excell Seltzer, MD  Electronically Signed    MDC/MedQ  DD: 11/01/2008  DT: 11/01/2008  Job #: [id number]   cc:   Lonzo Cloud. Kriste Basque, MD

## 2011-03-30 NOTE — Assessment & Plan Note (Signed)
OFFICE VISIT   SEQUOYA, HOGSETT  DOB:  09-12-1935                                        October 12, 2010  CHART #:  [id number]   HISTORY:  The patient returns to the office today for followup status  post emergency redo median sternotomy for hemiarch repair of acute type  A aortic dissection with resuspension of the native aortic valve on June 05, 2010.  She was last seen here in the office on July 06, 2010, at  which time, she was slowly improving.  Since then, the patient has done  fairly well.  She continues to follow up closely with Dr. Excell Seltzer who has  been adjusting her medical therapy for long-term congestive heart  failure and fluid retention.  Overall, she has improved quite steadily  and dramatically.  She has not had any further problems with atrial  fibrillation.  She has not had any further problems with fluid overload  or severe shortness of breath.  Her activity level has improved quite  nicely and she is now back doing most of all of her physical activities  that she had enjoyed prior to her surgery.  She still reports some mild  weakness of her right forearm as well as mild numbness involving her  right thumb and index finger.  She had more pronounced numbness and  weakness involving the entire right hand early after surgery and these  symptoms have all gradually improved.  She is left with mild residual  weakness and numbness, but overall she has good use of her right hand  and forearm.  She otherwise has no complaints.  She is not having any  chest pain.  She does report that she gets pain in her left mid back  with ambulation that seems to be relieved by rest.  This is not  associated with shortness of breath and seems somewhat atypical.  She  attributes it to her long-term severe arthritis and kyphoscoliosis.  The  remainder of her review of systems is unrevealing.  The remainder of her  past medical history is unchanged.   PHYSICAL EXAMINATION:  Notable for a well-appearing thin elderly female  with blood pressure 134/88, pulse 69 and regular, and oxygen saturation  98% on room air.  Examination of the chest reveals clear breath sounds  that are slightly diminished at the bases, but otherwise symmetrical.  No wheezes, rales, or rhonchi are noted.  Cardiovascular exam is notable  for regular rate and rhythm.  There is a grade 2/6 systolic murmur heard  best at the left lower sternal border.  No diastolic murmurs are noted.  The abdomen is soft and nontender.  The extremities are warm and well  perfused.  There is no lower extremity edema.  The remainder of her  physical exam is unrevealing.   DIAGNOSTIC TESTS:  CT angiogram of the chest and abdomen performed today  is reviewed.  This demonstrates stable appearance of the replaced  ascending thoracic aorta.  The transverse arch and descending thoracic  aorta appears normal.  There is chronic dissection involving the  innominate artery up to and involving the bifurcation of the right  common carotid and right subclavian artery.  There is no aneurysmal  enlargement.  There are small bilateral pleural effusions more so on the  right than  the left.  There is a smooth nonenhanced pleural-based  density near the apex on the left side in the posterior mediastinum.  This is potentially be a schwannoma or other benign abnormality,  although it could represent loculated component of residual fluid.  No  other abnormalities are noted.   IMPRESSION:  The patient is doing very well under the circumstances and  seems to be clinically quite stable at this time.   PLAN:  I have reviewed the long term implications of the presence of  repaired aortic dissection with the patient and her husband.  We have  discussed the rationale for long-term surveillance with routine CT  scans.  They understand the need for continued long-term attention to  strict control of her blood  pressure.  All of her questions have been  addressed.  With respect to physical activity, as long as she avoids any  sort of heavy lifting or strenuous activity, I think she can increase  her physical activity as tolerated.  We will plan to see her back in 6  months' time with a followup CT angiogram.   Salvatore Decent. Cornelius Moras, M.D.  Electronically Signed   CHO/MEDQ  D:  10/12/2010  T:  10/13/2010  Job:  [id number]   cc:   [name]. Excell Seltzer, MD  Lonzo Cloud. Kriste Basque, MD

## 2011-03-30 NOTE — Assessment & Plan Note (Signed)
[facility]                            CARDIOLOGY OFFICE NOTE   Carrol, Hougland KALEB LINQUIST                    MRN:          [id number]  DATE:07/13/2007                            DOB:          [birth year]    [name] is a delightful 76 year old woman who is a longtime  patient of Joe Bryce's. I am going to be assuming her care from here  forward. She has a history of ASD repair in 1982 at [facility]. She, in  reviewing her records, had a large secundum ASD that had been followed  since 1970. Her cardiac catheterization back in 1982 demonstrated a  3.5:1 left to right shunt and she was referred for surgical closure.  Since surgery, she has done very well. Of note, she did not have any  coronary artery disease at that time of her catheterization. Her main  cardiac problems have been paroxysmal atrial fibrillation and right  heart dilatation. Her most recent echocardiogram from October 21, 2006,  showed normal left ventricular size and systolic function with an  estimated EF of 55% and a dilated right ventricle and right atrium. Her  right ventricular systolic function has remained normal. She has not had  signs or symptoms of congestive heart failure.   From a symptomatic standpoint, she is doing well. She has no specific  complaints today. She denies chest pain, dyspnea or orthopnea, PND or  edema. She has not had palpitations, lightheadedness or syncope. She  does tire easily and this is a chronic problem.   CURRENT MEDICATIONS:  1. Metoprolol 50 mg twice daily.  2. Digoxin 0.25 mg daily.  3. Coumadin as directed.  4. Triamterene/hydrochlorothiazide 37.5/25 mg daily.  5. Quinapril 20 mg daily.  6. Potassium chloride 20 mg daily.  7. Zetia 10 mg daily.  8. Gabapentin 300 mg twice daily.  9. Senior multivitamin daily.  10.Prilosec over-the-counter daily.  11.Crestor 5 mg daily.  12.Astelin daily.  13.Ocuvite two daily.  14.Glucosamine  chondroitin twice daily.  15.Ambien 5 mg as needed.   PHYSICAL EXAMINATION:  She is alert and oriented in no acute distress.  Weight is 172 pounds. Blood pressure 136/82, heart rate 61, respiratory  rate is 16.  HEENT: Is normal.  NECK: Normal carotid upstrokes without bruits. Jugular venous pressure  is normal.  LUNGS:  Clear to auscultation bilaterally.  CARDIOVASCULAR: Apex is discrete and nondisplaced. The RV pulsation is  palpable. The heart is regular rate and rhythm. I do not appreciate any  murmurs or gallops.  ABDOMEN: Soft and nontender. No organomegaly.  EXTREMITIES: No clubbing, cyanosis or edema. Peripheral pulses are 2+  and equal throughout.   Chest x-ray from May of this year showed stable cardiomyopathy with no  active disease.   EKG from the office today shows normal sinus rhythm with a diffuse ST  segment and T wave abnormality, could be secondary to ischemia versus  repolarization change from hypertrophy.   Lipids were reviewed and her last panel from July of this year showed a  cholesterol of 143 with triglycerides of 173 and HDL 32 and LDL 76.  ASSESSMENT:  1. ASD status post surgical repair. The patient is stable. She does      have a dilated right heart, but no signs of right-sided congestive      heart failure. Continue current medical therapy.  2. Paroxysmal atrial fibrillation. She inquired today about need for      ongoing Coumadin. She has had a questionable transient ischemic      attack several years ago. In the setting of her dilated right      atrium and structural heart disease with an ASD, I think her risk      of recurrent atrial dysrhythmias is significant and I would favor      lifelong Coumadin as long as she tolerates. Rate control is      excellent. The patient is in sinus rhythm today and her heart rate      is excellent on digoxin and metoprolol.  3. Dyslipidemia. Would continue current therapy with Crestor and      Zetia. She has  had a difficult time tolerating statins and just      increased her Crestor from 2.5 to 5 mg. She seems to be tolerating      this change without symptoms at this point.   For followup, I would like to see Ms. Prude back in six months or  sooner if any new problems arise.     [name]. Excell Seltzer, MD  Electronically Signed    MDC/MedQ  DD: 07/13/2007  DT: 07/13/2007  Job #: [id number]   cc:   Lonzo Cloud. Kriste Basque, MD

## 2011-03-30 NOTE — Assessment & Plan Note (Signed)
[facility]                                 ON-CALL NOTE   MIKKI, ZIFF                    MRN:          [id number]  DATE:07/18/2008                            DOB:          16-Jul-1935    PRIMARY CARDIOLOGIST:  [name]. Excell Seltzer, MD   Ms. Funchess called her daughter to say she was having chest pain.  Her  daughter's name is Rhona Raider.  Ms. Linna Darner contacted me and ask what  she should do.  I advised her that since her mother had multiple cardiac  risk factors that she needed to be evaluated and I recommended having  her transported to the hospital by EMS.  She stated that she would try  to get her mother to do this.      Theodore Demark, PA-C  Electronically Signed      Rollene Rotunda, MD, [facility]  Electronically Signed   RB/MedQ  DD: 07/18/2008  DT: 07/19/2008  Job #: [id number]

## 2011-03-30 NOTE — Discharge Summary (Signed)
[name], [name]             ACCOUNT NO.:  [id number]   MEDICAL RECORD NO.:  [id number]          PATIENT TYPE:  INP   LOCATION:  6526                         FACILITY:  [facility]   PHYSICIAN:  [name]. Excell Seltzer, MD  DATE OF BIRTH:  [birth year]   DATE OF ADMISSION:  07/18/2008  DATE OF DISCHARGE:  07/19/2008                               DISCHARGE SUMMARY   PRIMARY CARDIOLOGIST:  [name]. Excell Seltzer, MD   PRIMARY CARE Jaben Benegas:  Lonzo Cloud. Kriste Basque, MD   DISCHARGE DIAGNOSIS:  Chest pain.   SECONDARY DIAGNOSES:  1. Paroxysmal atrial fibrillation, on chronic Coumadin      anticoagulation.  2. History of atrial septal defect, status post repair.  3. History of moderate aortic root enlargement measuring 4.3 x 4.3 cm.  4. History of mild-to-moderate right ventricular enlargement with      moderate biatrial enlargement and moderate pulmonary artery      enlargement by MRI and MRA, February 20, 2008.  5. History of transient ischemic attack.  6. Obstructive sleep apnea.  7. Peripheral neuropathy.  8. Gastroesophageal reflux disease and esophageal stricture status      post dilatation.  9. Hypertension.  10.Hyperlipidemia.   ALLERGIES:  1. LIPITOR.  2. ZOCOR.  3. CODEINE.  4. DEMEROL.  5. NEOSPORIN.  6. TRIPLE ANTIBIOTIC OINTMENT.  7. REQUIP.  8. CAFFEINE.   PROCEDURES:  None.   HISTORY OF PRESENT ILLNESS:  A 76 year old female with no prior history  of coronary artery disease.  She was in her usual state of health on  July 18, 2008, which she began to experience chest discomfort and  she called into our on-call team and was advised to come into the ED.  In the emergency room, her ECG showed no acute changes and her cardiac  markers were negative.  She was admitted for observation.   HOSPITAL COURSE:  The patient ruled out for MI.  She had no recurrence  of chest pain or dyspnea.  ECG showed no acute changes.  We have  arranged for her to have an outpatient Myoview on July 26, 2008, at  7:45 a.m. and she is being discharged home today in good condition.   DISCHARGE LABORATORY DATA:  Hemoglobin 13.5, hematocrit 39.8, WBC 7.2,  and platelets 195.  INR 1.1.  Sodium 141, potassium 3.7, chloride 101,  CO2 of 31, BUN 20, creatinine 0.65, glucose 124, total bilirubin 0.7,  alkaline phosphatase 48, AST 20, ALT 16, total protein 7.3, albumin 4.2,  calcium 9.6, CK 84, MB 1.6, and troponin I is 0.01.  BNP 135.  TSH  2.203.   DISPOSITION:  The patient is being discharged home today in good  condition.   FOLLOWUP PLANS AND APPOINTMENTS:  We have arranged for followup  outpatient adenosine Myoview on July 26, 2008, at 7:45 a.m.  She  will follow up with Jacolyn Reedy, PA for Dr. Excell Seltzer on July 31, 2008, at 9:45 a.m.  She has to follow up with Dr. Kriste Basque as previously  scheduled.   DISCHARGE MEDICATIONS:  1. Ambien 5 mg nightly.  2. Astelin p.r.n.  3. Crestor 5 mg daily.  4. Darvocet-N 100 one tablet q.4-6 h. P.r.n.  5. Digoxin 0.25 mg daily.  6. Fioricet p.r.n.  7. Hydrochlorothiazide.  8. Maxzide 37.5/25 daily.  9. Metoprolol 50 mg daily.  10.Neurontin 300 mg daily.  11.Ocuvite 2 tablets daily.  12.Potassium chloride 20 mEq daily.  13.Prilosec 20 mg daily.  14.Quinapril 20 mg daily.  15.Rhinocort 2 puffs nightly.  16.Coumadin as previously prescribed.   OUTSTANDING LABORATORY STUDIES:  Adenosine Myoview, pending, July 26, 2008.   DURATION OF DISCHARGE ENCOUNTER:  45 minutes including physician time.      Nicolasa Ducking, ANP      [name]. Excell Seltzer, MD  Electronically Signed    CB/MEDQ  D:  07/19/2008  T:  07/20/2008  Job:  [id number]   cc:   Lonzo Cloud. Kriste Basque, MD

## 2011-03-30 NOTE — H&P (Signed)
NAMEBROOKS, STOTZ             ACCOUNT NO.:  [id number]   MEDICAL RECORD NO.:  [id number]          PATIENT TYPE:  INP   LOCATION:  6526                         FACILITY:  [facility]   PHYSICIAN:  Brayton El, MD    DATE OF BIRTH:  [birth year]   DATE OF ADMISSION:  07/18/2008  DATE OF DISCHARGE:                              HISTORY & PHYSICAL   CHIEF COMPLAINT:  Chest pain.   HISTORY OF PRESENT ILLNESS:  [name] is a 76 year old white female  with past medical history significant for ASD repair in 1982, paroxysmal  atrial fibrillation, and esophageal obstruction status post dilatation  yesterday, presenting with a 1-hour history of substernal chest  heaviness.  The patient states that yesterday she had an endoscopy at  which time they had diagnosed her with some variety of yeast infection  in her esophagus and some type of obstruction.  The patient states that  they dilated her esophagus.  Over the past 24 hours, the patient states  she has been doing well without any complaints until this evening around  7 p.m. when she had sudden onset of substernal chest heaviness  associated with nausea.  The patient states she had been nauseous since  she ate dinner, but the chest heaviness was new.  The patient denies any  history of this type of sensation.  She states it lasted approximately 1  hour and that resolved on its own.  She is currently denying any  complaints.  She also states that other than the episode tonight, she  has been in her normal state of health without any other unusual  symptoms.   PAST MEDICAL HISTORY:  1. ASD repair in 1982.  2. Paroxysmal atrial fibrillation.  3. TIA versus sleep paralysis.  4. Sleep apnea.  5. Peripheral neuropathy.   She has had multiple surgeries which included a right rotator cuff  surgery in 2007, ovarian surgery with adhesions in 1993, vaginal  hysterectomy in 1987, cystocele and rectocele repair in 1987, and tubal  ligation in  1969.  She has a mildly dilated aortic root at 4.3 x 4.3 cm  per an MRI in April of this year.   FAMILY HISTORY:  Positive for premature coronary artery disease.   SOCIAL HISTORY:  No tobacco.  No alcohol.   DRUG ALLERGIES:  DEMEROL and CODEINE.   MEDICATIONS:  1. Lopressor 50 mg b.i.d.  2. Quinapril 10 mg daily.  3. Crestor 5 mg daily.  4. Triamterene/HCTZ 37.5/25 mg daily.  5. Neurontin 300 mg b.i.d.  6. Prilosec.  7. Potassium chloride 20 mEq daily.  8. Digoxin 0.25 mg daily.  9. Coumadin as directed by the INR.   REVIEW OF SYSTEMS:  As in HPI.  All other systems were reviewed and are  negative.   PHYSICAL EXAMINATION:  VITAL SIGNS:  Temperature 98.4, pulse 64, blood  pressure 184/88, respirations 16, and sating 100% on room air.  GENERAL:  She is in no acute distress.  HEENT:  Nonfocal.  NECK:  Supple without carotid bruit or JVD.  HEART:  Regular rate and rhythm without murmur, rub,  or gallop.  LUNGS:  Clear to auscultation bilaterally.  ABDOMEN:  Soft, nontender, and nondistended.  EXTREMITIES:  Without edema.  SKIN:  Warm and dry.  NEUROLOGIC:  Nonfocal.   LABORATORY DATA:  Sodium of 141, potassium 3.7, chloride 107, CO2 31,  BUN 20, creatinine 0.65, and glucose 124.  White count 7, hemoglobin 14,  hematocrit 40, and platelet count 195.  INR is 1.1.  D-dimer is 0.33.  CK-MB 1.8 and troponin is less than 0.05.  Chest x-ray within normal  limits.   IMPRESSION:  A 76 year old white female presenting with chest tightness  that is likely secondary to esophageal spasm versus angina.   PLAN:  The patient will be admitted to telemetry and ruled out for acute  myocardial infarction.  This is an unlikely diagnosis for this patient.  She will be made n.p.o. in anticipation of a probable stress test in the  morning.  Her medications, as listed above will be continued with the  addition of aspirin 81 mg p.o. daily.  She will also be placed on  Lovenox 40 mg subcu daily for  a DVT prophylaxis.      Brayton El, MD  Electronically Signed     SGA/MEDQ  D:  07/19/2008  T:  07/19/2008  Job:  [id number]

## 2011-03-30 NOTE — Assessment & Plan Note (Signed)
OFFICE VISIT  [name], [name] DOB:  19-Mar-1935                                        Mar 29, 2011 CHART #:  [id number]  HISTORY OF PRESENT ILLNESS:  The patient returns to the office today for followup and surveillance having undergone emergency hemiarch repair of acute type A aortic dissection with resuspension of native aortic valve on June 05, 2010.  She was last seen here in the office on October 12, 2010.  Since then overall, the patient has done fairly well, although she was hospitalized in April with acute on chronic exacerbation of congestive heart failure that has been primarily related to right heart failure and chronic tricuspid regurgitation.  Echocardiogram performed during that hospital admission was notable for normal left ventricular function with ejection fraction estimated 60%.  There was mild aortic insufficiency and mild mitral regurgitation.  There was moderate dilatation of the right ventricle with moderate right ventricular systolic dysfunction and severe tricuspid regurgitation.  Right heart catheterization confirmed the presence of moderate pulmonary hypertension.  The patient did well with medical therapy and has remained stable over the last couple of weeks since hospital discharge. She returns to our office today for followup and surveillance related to her aorta.  She has not had any pain in her chest, back, or abdomen, which could be related to her previous aortic dissection.  She has recovered to remarkably good physical activity level under the circumstances.  The remainder of her review of systems is noncontributory.  PAST MEDICAL HISTORY:  Complicated, but otherwise unchanged.  PHYSICAL EXAM:  Notable for a thin, well-appearing female with blood pressure 113/64, pulse is 59, oxygen saturation 98% on room air. Examination of the chest reveals clear breath sounds that are symmetrical bilaterally.  Cardiovascular exam  is notable for somewhat irregular heart rhythm.  There is a soft systolic murmur heard best along the left sternal border.  No diastolic murmurs are noted.  The abdomen is soft, nondistended, nontender.  There is no ascites.  The extremities are warm and well perfused.  There is mild lower extremity edema.  Distal pulses are not palpable at the ankle.  Femoral pulses are palpable.  The remainder of her physical exam is noncontributory.  DIAGNOSTIC TEST:  CT angiogram of the chest, abdomen, and pelvis performed earlier today at [facility] is reviewed.  This demonstrates stable radiographic appearance of the entire aorta.  The replaced ascending thoracic aorta graft is stable.  The remaining chronic dissection involving the innominate artery and right common carotid artery also remains stable.  The entire descending thoracic abdominal aorta is stable and without dissection.  There are no pleural effusions.  There are rounded, soft tissue masses in the lung apices on both sides, larger on the left than the right that could represent soft tissue tumors of neural origin such as schwannoma.  These are stable and unchanged.  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  Clinically, the patient is doing fairly well under the circumstances.  She has had problems with right heart failure, but this seems to be much improved on her current medical therapy.  I agree with plans for medical therapy  and would be reluctant to consider surgical treatment for tricuspid regurgitation.  PLAN:  We will have the patient return in 6 months' time with repeat CT angiogram.  Salvatore Decent. Cornelius Moras, M.D. Electronically Signed  CHO/MEDQ  D:  03/29/2011  T:  03/30/2011  Job:  [id number]  cc:    [name]. Excell Seltzer, MD Lonzo Cloud. Kriste Basque, MD

## 2011-03-30 NOTE — Assessment & Plan Note (Signed)
[facility]                            CARDIOLOGY OFFICE NOTE   ZENDAYA, GROSECLOSE                    MRN:          [id number]  DATE:08/07/2008                            DOB:          Aug 29, 1935    PRIMARY CARDIOLOGIST:  [name]. Excell Seltzer, MD.   PRIMARY CARE PHYSICIAN:  Lonzo Cloud. Kriste Basque, MD.   GASTROENTEROLOGIST:  Hedwig Morton. Juanda Chance, MD   This is a very pleasant 76 year old white female, the patient of Dr.  Excell Seltzer who has a history of paroxysmal atrial fibrillation on chronic  Coumadin as well as repair of atrial septal defect.  She went into the  hospital on July 18, 2008, with chest pain and ruled out for an MI.  She was diagnosed with esophageal spasm and placed on Protonix.  She  then had an outpatient adenosine Myoview on July 31, 2008, that  showed no sign of scar or ischemia and ejection fraction 77%.   The patient said she has not had any more than severe pain, but she is  still having quite a bit of indigestion, burping, and trouble  swallowing.  She has had esophageal dilatation in the past and believes  that she may need it again.  She already has an appointment to see Dr.  Lina Sar back.   CURRENT MEDICATIONS:  1. Metoprolol 50 mg b.i.d.  2. Digoxin 0.25 mg daily.  3. Coumadin as directed.  4. Quinapril 20 mg daily.  5. KCl 20 mEq daily.  6. Multivitamin daily.  7. Prilosec daily.  8. Ocuvite daily.  9. Ambien 5 mg daily.  10.Crestor 5 mg daily.  11.Stool softener.  12.Gabapentin 300 mg b.i.d.  13.Triamterene and hydrochlorothiazide 37.5/25 daily.  14.Glucosamine chondroitin b.i.d.  15.Fish oil 1000 mg b.i.d.   PHYSICAL EXAMINATION:  GENERAL:  A pleasant 76 year old white female, in  no acute distress.  VITAL SIGNS:  Blood pressure 132/79, pulse 61, and weight 166.  NECK:  Without JVD, HJR bruit, or thyroid enlargement.  LUNGS:  Clear anterior, posterior, and lateral.  HEART:  Regular rate and rhythm at 61  beats per minute.  Normal S1 and  S2 with a 2/6 systolic murmur at the left and right sternal border.  ABDOMEN:  Soft without organomegaly, masses, lesions, or abnormal  tenderness.  EXTREMITIES:  She has trace of edema bilaterally, but positive distal  pulses.   IMPRESSION:  1. Chest pain felt secondary to esophageal spasm for followup with Dr.      Lina Sar.  2. Normal stress adenosine Myoview in July 31, 2008.  3. Paroxysmal atrial fibrillation on chronic Coumadin.  4. History of atrial septal defect, status post repair.  5. History of moderate aortic root enlargement measuring 4.3 x 4.3.  6. History of mild-to-moderate right ventricular enlargement with      bilateral enlargement and moderate pulmonary artery enlargement by      MRI and MRA in April 2009.  7. History of transient ischemic attack.  8. Obstructive sleep apnea.  9. Peripheral neuropathy.  10.Gastroesophageal reflux disease and esophageal stricture dilatation      in the  past.  11.Hypertension.  12.Hyperlipidemia.  13.Recent squamous and basal cell skin cancer, removed.   PLAN:  The patient is doing well from a cardiac standpoint.  She will  follow up with Dr. Excell Seltzer in 3 months.  She does have an appointment in  the [facility] today and she has already scheduled to see Dr. Lina Sar back for followup of her GI problems.      Jacolyn Reedy, PA-C  Electronically Signed      Luis Abed, MD, [facility]  Electronically Signed   ML/MedQ  DD: 08/07/2008  DT: 08/07/2008  Job #: [id number]

## 2011-03-31 ENCOUNTER — Encounter: Payer: Medicare Other | Admitting: *Deleted

## 2011-04-02 ENCOUNTER — Telehealth: Payer: Self-pay | Admitting: Physician Assistant

## 2011-04-02 NOTE — Procedures (Signed)
EEG NUMBER:  [id number].   CLINICAL HISTORY:  A 76 year old woman with episode of possible sleep  paralysis versus TIA versus seizure.  The EEG is performed for evaluation.   DESCRIPTION:  Dominant rhythm tracing is a moderate amplitude alpha rhythm  of 10-11 Hz which predominates posteriorly, appears without abnormal  asymmetry and attenuates with eye opening and closing.  Abundant low  amplitude fast activity is seen throughout the recording,  dominant  especially in the frontal and temporal areas.  Some occasional moderate  amplitude theta rhythms were seen emanating from the central head regions.  No focal slowing is noted.  No epileptiform discharges were seen.  The  patient remained in the awake state throughout the recording.  Photic  stimulation produced symmetric driving responses.  Hyperventilation was not  performed.  Single channel devoted EKG revealed sinus rhythm throughout with  a rate of approximately 78 beats per minute.   CONCLUSIONS:  Essentially normal study in the awake state. The abundant  amount of beta activity seen in this study is a normal finding in anxious  individuals or in those taking medications of the sedative/hypnotic class.  No focal slowing is noted and no epileptiform discharges were seen.      Michael L. Thad Ranger, M.D.  Electronically Signed     ZOX:WRUE  D:  10/04/2006 17:16:27  T:  10/05/2006 01:22:01  Job #:  [id number]

## 2011-04-02 NOTE — H&P (Signed)
[name], BATESON             ACCOUNT NO.:  [id number]   MEDICAL RECORD NO.:  [id number]          PATIENT TYPE:  INP   LOCATION:  6705                         FACILITY:  [facility]   PHYSICIAN:  Gustavus Messing. Orlin Hilding, M.D.DATE OF BIRTH:  [birth year]   DATE OF ADMISSION:  07/09/2005  DATE OF DISCHARGE:                                HISTORY & PHYSICAL   CHIEF COMPLAINT:  Code stroke. Patient's chief complaint, weak all over, I  feel like I did when I had my transient ischemic attack.   HISTORY OF PRESENT ILLNESS:  [name] is a 76 year old right handed white  woman with a history of possible TIA in June of 2000. She does have a  history of atrial fibrillation and is on Coumadin. She sees Dr. Sandria Manly for  neuropathy. At baseline she is complaining of weakness of her legs at all  times but she is able to walk unassisted. She shuffles a little bit. Dr. Glennon Hamilton is her cardiologist. Today she was standing out in her yard with her  husband, talking to neighbors at about 4:15. Her husband went inside and  took a nap. He heard his wife come in but she did not come into talk to him.  He heard the phone ring and nobody answered it. He then woke up and noted  his wife was snoring in the recliner next to him and he was not able to wake  her up. She was moaning and groaning. A son called because no one answered  the phone, and when the husband told him what had happened he came over and  she would not wake up then; 911 was called and she was transferred to the  hospital. She is now more alert and rapidly approaching her baseline.   REVIEW OF SYSTEMS:  Positive for headache. She took some Tylenol. She has  chronic leg pains because of neuropathy. Chronic left rotator cuff damage.   PAST MEDICAL HISTORY:  1.  Significant for a remote TIA in June of 2000.  2.  She has atrial fibrillation on Coumadin with an INR of 2.1. She has had      an atrial septal defect repaired in the past.  3.  She has  a left rotator cuff injury but is not a surgical candidate.  4.  She has hypertension.  5.  There is no history of diabetes.  6.  She has hyperlipidemia.  7.  She had some kind of a rectocele repair.  8.  Hysterectomy.  9.  Had a remote left Bells palsy.  10. Had esophageal dilation.  11. She may also have restless leg syndrome.   MEDICATIONS:  1.  Requip 1 mg q.h.s.  2.  Coumadin 5 mg daily except 2.5 mg on Wednesday.  3.  Metoprolol 50 mg b.i.d.  4.  Gabapentin 300 mg b.i.d.  5.  Potassium 20 mEq daily.  6.  Digoxin 250 mcg daily.  7.  Z-P 10 mg daily.  8.  Crestor 2.5 mg daily.  9.  Quinapril 20 mg daily.  10. Estradiol 0.5 mg daily.  11. Triamterine-hydrochlorothiazide  37.5/25 once daily.  12. Prilosec over-the-counter once daily.  13. Benadryl 25 q.h.s.  14. Rhinocort p.r.n.  15. Astelin p.r.n.  16. Darvocet p.r.n.  17. Ambien 5 mg p.r.n.  18. Clinoril p.r.n.  19. Ocu-Vite daily.  20. Multivitamin daily.  21. Glucosamine-chondroitin.   ALLERGIES:  Mostly intolerances to CODEINE AND DEMEROL.   SOCIAL HISTORY:  She is married. No cigarette or alcohol use.   FAMILY HISTORY:  Very strongly positive for coronary artery disease.   PHYSICAL EXAMINATION:  GENERAL:  She is awake and alert. She is able to  answer two questions, follow two commands.  HEENT:  She has full extraocular movements with no gaze deficit. She may  possibly have a very slight left facial droop. She does have an old Bell's  which is quite subtle.  MOTOR EXAM: She has a drift in all four extremities with some significant  limitation of range of motion with pain in the left shoulder because of the  rotator cuff. I do not really get any clear cut neurologic weakness but she  is drifting all over. There is no ataxia on finger-to-nose, heel-to-shin.  SENSORY:  Normal. There is no aphasia. Mild dysarthria is present.   CT of the brain is essentially negative except for minimal small vessel  changes in  the posterior grounds. She had a CT angiogram which was read as  essentially normal. Digoxin level was 0.8. INR was 2.1. CBC and I-STAT are  fairly normal.   ASSESSMENT:  Possible transient ischemic attack. Nothing clear on exam  currently. She is therapeutic on her Coumadin.   PLAN:  Admit, continue Coumadin if she passes swallow evaluation. Will get  MRI, MRA. I have notified Dr. Glennon Hamilton at the patient's request. We may  be able to discharge her soon.      [name], M.D.  Electronically Signed     CAW/MEDQ  D:  07/09/2005  T:  07/10/2005  Job:  [id number]   cc:   Genene Churn. Love, M.D.  [address]  [address]  [place]  [place] [address]  Fax: [phone]   E. Graceann Congress, M.D.  [address]  [address]  [place]  [place] [address]

## 2011-04-02 NOTE — Op Note (Signed)
[name], [name]             ACCOUNT NO.:  [id number]   MEDICAL RECORD NO.:  [id number]          PATIENT TYPE:  AMB   LOCATION:  SDS                          FACILITY:  [facility]   PHYSICIAN:  Vania Rea. Supple, M.D.  DATE OF BIRTH:  03/14/1935   DATE OF PROCEDURE:  04/06/2006  DATE OF DISCHARGE:                                 OPERATIVE REPORT   PREOPERATIVE DIAGNOSES:  1.  Right shoulder chronic impingement syndrome with rotator cuff tear.  2.  Right shoulder symptomatic acromioclavicular joint arthropathy.   POSTOPERATIVE DIAGNOSES:  1.  Right shoulder chronic impingement syndrome with rotator cuff tear.  2.  Right shoulder symptomatic acromioclavicular joint arthropathy.  3.  Severe biceps tendon tendinopathy with degenerative tear.   PROCEDURES:  1.  Open right shoulder rotator cuff repair with subacromial decompression.  2.  Right shoulder distal clavicle resection.  3.  Right shoulder biceps tendon tenodesis.   SURGEON:  Vania Rea. Supple, M.D.   Threasa HeadsFrench Ana A. Shuford, P.A.-C.   ANESTHESIA:  General endotracheal.   ESTIMATED BLOOD LOSS:  150 cc.   DRAINS:  None.   HISTORY:  [name] is a 76 year old female who has had chronic bilateral  shoulder pain, left being more severe historically and she does have a known  left shoulder rotator cuff tear arthropathy.  Over the last several months  her right shoulder has become increasingly painful with subsequent MRI scan  confirming rotator cuff tear.  Due to her significant pain related to the  right shoulder and the MRI findings of a moderate sized rotator cuff tear,  she is brought to the operating room at this time for planned right shoulder  rotator cuff repair.   Preoperatively I counseled [name] on treatment options, as well as  risks versus benefits thereof.  Possible surgical complications of bleeding,  infection, neurovascular injury, persistent pain, loss of motion, recurrence  of rotator cuff  tear, and additional need for additional surgery, as well as  potential for anesthetic complications were reviewed.  She understands and  accepts, and agrees with our planned procedure.   Of note, [name] is also on chronic Coumadin, and we have made  coordinated efforts through her primary care physician, Dr. Corinda Gubler, for a  transition to Lovenox preoperatively and then resuming Coumadin postop.   PROCEDURE IN DETAIL:  After undergoing routine preoperative evaluation, the  patient received prophylactic antibiotics.  Patient was brought to the  operating room and placed supine on the operating table, and underwent  smooth induction of general endotracheal anesthesia.  Placed in the beach  chair position and appropriately padded and protected.  The right shoulder  girdle region was then sterilely prepped and draped in standard fashion.  A  proposed incision was outlined from the [facility] joint and extending laterally and  distally for a total length of approximately 6 cm.  Skin flaps were  mobilized and the deltoid was split at the junction between the anterior and  mid thirds.  Electrocautery was used for hemostasis.  The distal clavicle  and anterior acromion were exposed with subperiosteal dissection.  The  distal 1 cm of the clavicle was then resected with an oscillating saw.  An  anterior-inferior acromionectomy was then performed with an oscillating saw,  creating a type-1 morphology.  Multiple bursal adhesions were identified and  divided with a combination of blunt and sharp dissection.  The rotator cuff  was found to have a V-shaped tear involving the distal supraspinatus.  The  teres minor and subscapularis were intact.  The free margin of the rotator  cuff tear was debrided sharply back to healthy appearing tissue and then we  mobilized the rotator cuff on superior and inferior aspects.  Inspection of  the biceps tendon through the tear in the rotator cuff showed it to be   markedly attenuated at the level of the rotator interval and, with this  finding, we elected to perform a biceps tenodesis.  A grasping suture of #1  FiberWire was passed through the biceps tendon distally at the bicipital  groove, and then the proximal origin was amputated with a Mayo scissors.  The glenohumeral joint was then irrigated.  A bony trough was then developed  at the apex of the greater tuberosity at the rotator cuff tear site using an  osteotome.  Two side-to-side, figure-of-eight sutures were placed to allow a  closure of the interval between the anterior and posterior aspects of the  rotator cuff.  An Arthrex bio corkscrew anchor was then placed at the  osteoarticular margin of the greater tuberosity with free limbs brought up  through the adjacent aspects of the rotator cuff, utilizing a horizontal  mattress construct, and these sutures were then tied with multiple overhand  throws.  We then performed a suture bridge repair, bringing the limbs of the  suture anchor as well as the more lateral side-to-side suture limbs through  the eyelets of the two Arthrex push-locks and these were then directed down  through the greater tuberosity, allowing excellent reinforcement, repair and  compression of the rotator cuff margin against the bony bed on the  tuberosity.  Attention was then directed to the biceps tendon stump, which  was tenodesed within the bicipital groove utilizing a #2 FiberWire suture,  stitching the biceps tendon to the adjacent soft tissues, and firmly  transfixing the biceps tendon within the groove.  At this point final  inspection and irrigation were then completed.  The deltoid split was then  repaired utilizing a series of two #2 FiberWires through bone tunnels in the  anterior acromion to repair the anterior deltoid and the remaining deltoid  split was then reapproximated with figure-of-eight #1 Vicryl sutures, 2-0 Vicryl for the subcu and intracuticular 3-0  Monocryl for the skin, followed  by Steri-Strips.  I should mention, at the beginning of the case,  approximately 8 cc of 0.5% Marcaine with epi was instilled along the skin  edges, and additional Marcaine with epi were instilled along the skin edges  and into the incision at the end of the case.  A bulky dry dressing was then  applied, followed by sling immobilizer.  The patient was then placed supine,  extubated, and taken to the recovery room in stable condition.      Vania Rea. Supple, M.D.  Electronically Signed     KMS/MEDQ  D:  04/06/2006  T:  04/06/2006  Job:  [id number]

## 2011-04-02 NOTE — Telephone Encounter (Signed)
Creatinine done at urologist's office 5/3 was ok (0.89).  I want her to come in for a repeat BMET in one week.

## 2011-04-02 NOTE — Discharge Summary (Signed)
[name]             ACCOUNT NO.:  [id number]   MEDICAL RECORD NO.:  [id number]          PATIENT TYPE:  INP   LOCATION:  3008                         FACILITY:  [facility]   PHYSICIAN:  Genene Churn. Love, M.D.    DATE OF BIRTH:  03/06/1935   DATE OF ADMISSION:  10/03/2006  DATE OF DISCHARGE:  10/04/2006                                 DISCHARGE SUMMARY   This is one of several [facility] admissions for this 76 year old  right-handed white married female admitted from the emergency room for  evaluation of dysarthria and quadriparesis and to rule out stroke.   HISTORY OF PRESENT ILLNESS:  [name] has a history of peripheral  neuropathy, and since August 2006 she has had episodes in which she will be  taking a nap and had significant difficulty in awaking and then have extreme  difficulty with movement of her arms and legs. She was evaluated July 09, 2005 with negative studies including MRI of the brain, intracranial MRA,  EKG, and monitoring. At that time her INR was 2.1 on Coumadin for history of  paroxysmal atrial fibrillation. She has been documented as an outpatient.  Had evidence of sleep apnea. At times uses CPAP, but has difficulty with  night time nocturia. Does not get restful sleep and using CPAP because of  her nocturia. There have been questions to whether or not she may have sleep  paralysis, but she has no other symptoms of narcolepsy such as hypnagogic  hallucinations, cataplexy or irresistible daytime sleep. She does have poor  sleep at night and has been on Ambien 5 mg at night. The morning of  admission she got up and made coffee. Had a good night's sleep but then went  to sit down in the recliner and had difficulty in being aroused. Her family  has been through this before. Initially watched her for several hours, and  she was subsequently brought to the emergency room. 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 She was admitted for further evaluation and observation.   PAST MEDICAL HISTORY:  Significant for open heart surgery with closure of  ASD in 1982, atrial fib requiring DC cardioversion in 1982 and again in  1993, history of TIA versus sleep paralysis in August 2006, right rotator  cuff surgery in 2007 with continued left rotator cuff problems, ovarian  surgery with adhesions 1993, vaginal hysterectomy 1987, cystocele and  rectocele repair 1987, tubal ligation 1969, sleep apnea, peripheral  neuropathy, and restless leg syndrome.   ALLERGIES:  History of allergy to DEMEROL and CODEINE and cannot take  LIPITOR and ZOCOR because of increased muscle weakness.   MEDICATIONS:  1. Metoprolol 50 mg b.i.d.  2. Digoxin 0.25 mg daily.  3. Darvocet-N 100 p.r.n.  as needed.  4. Coumadin 5 mg daily except 2.5 mg on Wednesdays.  5. Triamterene/HCTZ 37.5/25 one daily.  6. Quinapril 20 mg daily.  7. KCl 20 mEq daily.  8. Rhinocort one spray in one nostril p.r.n.  9. Fioricet as needed.  10.Zetia 10 mg daily.  11.Gabapentin 300 mg q.h.s.  12.Ocuvite daily.  13.Multivitamin daily.  14.Glucosamine/chondroitin daily.  15.Ambien 5 mg q.h.s.  16.Estradiol 0.5 mg daily.  17.Prilosec 20 mg daily.  18.Requip 1 mg q.h.s.  19.Crestor 2.5 mg daily.   PHYSICAL EXAMINATION:  GENERAL:  At time of admission revealed a well-  developed white female.  VITAL SIGNS:  Blood pressure right, left arm 160/80, 170/80, heart rate 64  and regular.  NEUROLOGICAL:  She was alert. She would follow commands and understood the  conversation. She would attempt to hold up two fingers with each hand.  She  got __________  the extremity but hands and arms, did  not fall in her face.  She had decreased tone in her upper and lower extremities. She had no 7th  nerve. Hearing was intact. Tongue was midline. Gags were present.  Sternocleidomastoid and trapezius testing were normal. She had no movement  in her extremities, but the reflexes were intact. Plantar responses were  downgoing. The rest for general examination was unremarkable.   LABORATORY DATA:  CT scan of the brain showed small vessel disease. White  blood cell count was 5500, hemoglobin 14.7, hematocrit 43.3, platelets 201K.  INR was 2.4. Liver function tests were normal. CK myoglobin was 64.3 which  is normal range. CK-MB was less than 1. Troponin was less than 0.05. Her  liver function tests as mentioned were normal. Her CK was 64.   A 12-lead EKG showed normal sinus rhythm and inferoposterior infarct age  indeterminate, and was considered an abnormal EKG. Her MRI showed 3 x 2 mm  aneurysm in the right middle cerebral artery bifurcation. Head CT showed a  stable enhanced CT scan with no acute findings.  An EEG was performed in the  hospital which was unremarkable.   HOSPITAL COURSE:  The patient's symptoms cleared by 9 p.m. the day of  admission. She was watched overnight.  She had no recurrent episodes. There  was no evidence to suggest hypokalemia though a potassium was slightly low  at 3.3. She had good reflexes. Question was raised of a sleep paralysis with  sleep disorder.   IMPRESSION:  1. Quadriparesis, code 344.00 associated with sleep. Rule out sleep      paralysis.  2. History of atrial septal defect status post closure in 1982.  3. Atrial fibrillation recurrent with DC cardioversion 1982 and 1993.  4. History of transient ischemic attacks versus sleep paralysis August      2006, code 435.9.  5. History of right rotator cuff surgery 2007 with residual left rotator      cuff disease. 6. History of ovarian surgery with adhesions in 1993.  7. Vaginal hysterectomy 1987.   8. Cystocele and rectocele repair in 1987.  9. Tubal ligation 1969.  10.History of obstructive sleep apnea, code 780.57.  11.Peripheral neuropathy, code 356.9.  12.Restless leg syndrome, code 333.99.   At this time let us discharge her on home medicines:   HOME MEDICINES:  1. Requip 1 mg p.o. q.h.s.  2. Crestor 2.5 mg p.o. daily.  3. Benadryl 25 mg p.o. daily.  4. Astelin nasal spray p.r.n.  5. Lopressor 50 mg b.i.d.  6. Coumadin 5 mg p.o.  daily except 2.5 mg on Wednesdays.  7. Quinapril 20 mg p.o. daily.  8. KCl 20 mEq p.o. daily.  9. Fioricet p.o. p.r.n.  10.Zetia 10 mg p.o. daily.  11.Neurontin 300 mg p.o. q.h.s.  12.Ocuvite one p.o. daily.  13.Ambien 5 mg p.o. q.h.s.  14.Estradiol 5 mg p.o. daily.  15.Prilosec 20 mg p.o. daily.  16.Triamterene/HCTZ 37.5/25 daily.  17.Digoxin 0.25 mg daily.   He was discharged improved on prehospital status. A polysomnogram will be  obtained as an outpatient.           ______________________________  Genene Churn. Sandria Manly, M.D.     JML/MEDQ  D:  10/04/2006  T:  10/05/2006  Job:  [id number]   cc:   Lonzo Cloud. Kriste Basque, MD

## 2011-04-02 NOTE — Consult Note (Signed)
[name], [name]             ACCOUNT NO.:  [id number]   MEDICAL RECORD NO.:  [id number]          PATIENT TYPE:  INP   LOCATION:  6705                         FACILITY:  [facility]   PHYSICIAN:  Jesse Sans. Wall, M.D.   DATE OF BIRTH:  02-13-1935   DATE OF CONSULTATION:  07/10/2005  DATE OF DISCHARGE:                                   CONSULTATION   REFERRING PHYSICIAN:  Santina Evans A. Orlin Hilding, M.D.   REASON FOR CONSULTATION:  We were asked by Dr. Orlin Hilding to evaluate [name], a delightful, 76 year old, white female followed by Dr. Glennon Hamilton  for history of atrial septal defect and paroxysmal atrial fibrillation.   She was admitted with an episode of mental lethargy and confusion similar to  previous TIAs she had in the past.  She was admitted by Dr. Orlin Hilding.  CT  scan and MRI scan was negative.  Question of a possible TIA was raised.  However, she has been in sinus rhythm and her INR was 2.1.   She is recovering now, but seems a little bit sleepy and lethargic on my  exam.   PAST MEDICAL HISTORY:  1.  Hypertension.  2.  Hyperlipidemia.  3.  TIA in 2000.  4.  Atrial fibrillation with cardioversion x3.  5.  She is on chronic Coumadin.  6.  ASD repair.  7.  Rectocele repair.  8.  Hysterectomy.  9.  EGD with dilatation.   MEDICATIONS PRIOR TO ADMISSION:  1.  Estradiol.  2.  Potassium 20 mEq a day.  3.  Digoxin 0.25 mg a day.  4.  Lopressor 50 mg b.i.d.  5.  Coumadin 5 mg a day, except 2.5 mg on Wednesday.  6.  Gabapentin 300 mg p.o. b.i.d.  7.  Triamterene HCT 37.5/25 mg daily.  8.  Quinapril 20 mg a day.  9.  Requip 1 mg a day.  10. Multivitamin daily.  11. Darvocet p.r.n.  12. Tranxene 3.75 mg p.r.n.  13. Zetia 10 mg a day.  14. Crestor 2.5 mg a day.   SOCIAL HISTORY:  She lives in [place] with her husband.  She is a retired  Engineer, civil (consulting).   FAMILY HISTORY:  Noncontributory.   REVIEW OF SYSTEMS:  Other than the HPI, noncontributory.   PHYSICAL EXAMINATION:   VITAL SIGNS:  Blood pressure 143/87, pulse 64 in  sinus rhythm, respirations 18, temperature 97.9, O2 saturations 95% on room  air.  GENERAL:  She is alert and oriented x3, but somewhat sluggish.  She is in no  acute distress.  NEUROLOGIC:  Per neurology.  HEENT:  Normocephalic, atraumatic.  NECK:  Carotid upstrokes equal bilaterally without bruits.  No JVD.  HEART:  Regular rate and rhythm with no obvious murmur.  LUNGS:  Clear.  ABDOMEN:  Soft, good bowel sounds.  There is no midline bruit.  There is no  hepatomegaly.  There is no tenderness.  EXTREMITIES:  No clubbing, cyanosis or edema.  Pulses are present.   LABORATORY DATA AND X-RAY FINDINGS:  Other than the INR of 2.1, it is  unremarkable and not notable.  ASSESSMENT:  1.  Question transient ischemic attack per neurology assessment.  Sounds      like global ischemic event or a global amnesia event.  2.  History of paroxysmal atrial fibrillation, currently in sinus rhythm.      She has had an atrial septal defect repair.  She is on Coumadin and is      therapeutic at 2.1.  3.  Hyperlipidemia with history of intolerance to statins.  She is on      Crestor and Zetia and tolerating at present.   RECOMMENDATIONS:  1.  Continue current medications.  2.  Follow up with Dr. Corinda Gubler in 3-4 weeks.  3.  We will consider echocardiogram down the road.  It is not necessary this      be done in the hospital.      Maisie Fus C. Wall, M.D.  Electronically Signed     TCW/MEDQ  D:  07/10/2005  T:  07/11/2005  Job:  [id number]   cc:   Santina Evans A. Orlin Hilding, M.D.  Fax: [phone]   E. Graceann Congress, M.D.  [address]  [address]  [place]  [place] [address]   Lonzo Cloud. Kriste Basque, M.D. [facility]  [address]  [place]  [place] [address]

## 2011-04-02 NOTE — Discharge Summary (Signed)
[name], [name]             ACCOUNT NO.:  [id number]   MEDICAL RECORD NO.:  [id number]          PATIENT TYPE:  INP   LOCATION:  6705                         FACILITY:  [facility]   PHYSICIAN:  Gustavus Messing. Orlin [name], M.D.DATE OF BIRTH:  [birth year]   DATE OF ADMISSION:  07/09/2005  DATE OF DISCHARGE:  07/11/2005                                 DISCHARGE SUMMARY   ADMITTING DIAGNOSES:  Possible transient ischemic attack.  Patient arrived  as a code stroke.   DISCHARGE DIAGNOSIS:  Spell of unresponsiveness, possible transient ischemic  attack, 435.8; however, could have been an episode of sleep paralysis.   DIET ON DISCHARGE:  No restrictions.   ACTIVITY:  Increase activity slowly.  May shower and bathe.  May walk with  supervision.  May walk up steps with assistance.   WOUND CARE:  Not applicable.   PAIN MANAGEMENT:  Continue prior regimen.  Patient is advised to continue  all of her home medications which include:  1.  Coumadin 5 mg a day to be adjusted as needed.  2.  Requip 1 mg q.h.s.  3.  Metoprolol 50 mg b.i.d.  4.  Gabapentin 300 mg b.i.d.  5.  Potassium 20 mEq daily.  6.  Digoxin 0.25 mg daily.  7.  Zetia 10 mg daily.  8.  Crestor 2.5 mg daily.  9.  Quinapril 20 mg daily.  10. Estradiol 0.5 mg daily.  11. Triamterene/hydrochlorothiazide 37.5/25 daily.  12. Prilosec OTC one a day.  13. Benadryl 25 q.h.s. p.r.n.  14. Darvocet N 100 p.o. q.4-6h. p.r.n.  15. Ambien 5 mg p.o. q.h.s. p.r.n.  16. Multivitamin one a day.   FOLLOWUP:  Patient is to call Dr. Sandria Manly at [phone] to arrange a followup in  about two to four weeks and follow up with Dr. Glennon Hamilton as instructed.  She needs to go to the Coumadin clinic on Monday or Tuesday of this week.   PROCEDURES:  1.  CT scan of the brain which is essentially negative.  2.  MRI of the brain that showed no acute abnormality, very minimal      posterior white matter change consistent with small vessel ischemia.  3.  MRA of  the intracranial structures was negative and MRA of the neck was      negative.  4.  On the monitor, no significant ectopy was noted.   LABORATORIES:  INR of 2.1, digoxin of 0.8.  CBC was normal.  Activated  partial thromboplastin time was 36.  Differential was normal.  I-STAT showed  that she had glucose of 130, but was otherwise normal.   HOSPITAL COURSE:  Please refer to the history and physical for details.  Briefly, this is a 76 year old woman with history of remote TIA, atrial  fibrillation on Coumadin, atrial septal defect repair remotely, left rotator  cuff injury, hypertension, hyperlipidemia, rectocele repair, hysterectomy,  remote Bell's palsy, restless leg syndrome, questionable history of  fibromyalgia, who had a spell of unresponsiveness.  She had been outside  talking and behaved normally; came inside and was lying down on the couch  and snoring.  A telephone call awoke her husband, who woke up and tried to  wake his wife up and could not wake her up immediately.  She seemed confused  and complained of being wobbly all over.  On arrival to the emergency room,  a CT was negative.  Her symptoms improved rapidly; there were no focal  findings, so she was not deemed a candidate for any acute intervention,  moreover, her pro time was therapeutic with an INR of 2.1.  She was observed  for 36 hours, seen by cardiology.  They did not recommend any specific  further workup and she spontaneously resolved and was discharged at baseline  condition with a mild gait disorder due to her neuropathy in her feet with  follow up instructions as above.      [name], M.D.  Electronically Signed     CAW/MEDQ  D:  07/11/2005  T:  07/12/2005  Job:  [id number]   cc:   Genene Churn. Love, M.D.  [address]  [address]  [place]  [place] [address]  Fax: [phone]   E. Graceann Congress, M.D.  [address]  [address]  [place]  [place] [address]

## 2011-04-02 NOTE — Assessment & Plan Note (Signed)
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  She  had repair of the atrial septal defect at [facility] by Dr. Arnette [name] in  1982.  She has done quite well, though she has had paroxysmal atrial  fibrillation and hyperlipidemia, questionable TIA.   She had her fifth episode of quadriparesis or sleep paralysis November  20 and was seen by Dr. Sandria [name].  This first occurred in the summer of 2006.  She has been seen at the hospital on two occasions.  MRI suggested a  small aneurysm but this apparently was stable.  She is to have a sleep  study in the near future and Dr. Sandria [name] has also suggested possibly a  second opinion.  She is having no cardiac symptoms.   Medications include:  1. Metoprolol 50 b.i.d.  2. Digoxin 0.25 daily.  3. Coumadin.  4. Triamterene/HCTZ 37.5/25 daily.  5. Quinapril 20.  6. Potassium chloride 20 a day.  7. Gabapentin 300.  8. Estradiol 0.5 daily.  9. Prilosec over the counter.  10.Requip 1 mg daily.  11.Crestor 2.5.  12.Ambien 5.   Her recent laboratory data was unremarkable except for potassium of 3.3.   PHYSICAL EXAMINATION:  VITAL SIGNS:  Blood pressure 124/76, pulse 65,  sinus bradycardia.  GENERAL:  Appearance normal.  NECK:  JVP is not elevated.  Carotid palpables are palpable and equal  without bruits.  LUNGS:  Clear.  CARDIAC:  No murmur.  There is a widely split second sound.  ABDOMEN:  Unremarkable.  EXTREMITIES:  Normal.   IMPRESSION:  Diagnoses as listed, specifically recurrent neurological  syndrome of undetermined etiology per Dr. Sandria [name].  She also has some  hypokalemia.   We plan to recheck her BMP.  She is to have a  sleep study.  Follow up  with Dr. Sandria [name].  I will see her back in six months or p.r.n.     E. Felicia Congress, MD, [facility]  Electronically Signed    EJL/MedQ  DD: 10/12/2006  DT: 10/12/2006  Job #: [id number]

## 2011-04-02 NOTE — Assessment & Plan Note (Signed)
[facility]                            CARDIOLOGY OFFICE NOTE   [name], [name]                    MRN:          [id number]  DATE:03/16/2007                            DOB:          04-Oct-1935    [name] is a very pleasant 76 year old white, married female,  retired Designer, jewellery, followed here since 1970, when she was  diagnosed with atrial septal defect.  She had repair of atrial septal  defect at [facility] by Dr. Arnette Felts in 1982.  She has done quite well since  that time, although she has had paroxysmal atrial fibrillation,  hyperlipidemia and questionable TIA.   She has had history of sleep paralysis and been seen by Dr. Sandria Manly.  She  had an MRI, suggesting a small cerebral aneurysm of the right middle  cerebral artery.   She continues to have some discomfort of her legs, at times waking her  from sleep, relieved by walking around.   She is having no cardiac symptoms, though she does tire somewhat easily.   1. She is on metoprolol 50 b.i.d.,  2. Digoxin 0.25 daily.  3. Coumadin.  4. Triamterene/HCTZ 37.5/25.  5. Quinapril 20.  6. Potassium chloride.  7. Zetia 10.  8. Crestor 2.5.  9. Gabapentin 300 b.i.d.  10.Astelin.   Patient states she has been followed in the lipid clinic and has been  hesitant to take more statin.   PHYSICAL EXAM:  Blood pressure 140/90, pulse 53, sinus bradycardia.  GENERAL APPEARANCE:  Normal.  JVP is not elevated.  Carotid pulses  palpable and equal without bruits.  LUNGS:  Clear.  CARDIAC EXAM:  Reveals no murmur.  There is wide splitting of the second  sound.  ABDOMINAL EXAM:  Unremarkable.   The EKGs are normal.   IMPRESSION:  Diagnosis as above.  1. postoperative closure of secundum ASD 26 years ago.  2. Hyperlipidemia.  3. History of sleep paralysis.  4. Paroxysmal atrial fibrillation.  5. Question of OTA.   The EKG today reveals sinus bradycardia, first degree AV block,  nonspecific  STT abnormality.   I should note the most recent echo, December 2007, revealed a normal LV  with normal EF.  There was some dilatation of the right ventricle and  right atrium, EF was 55%.   I have suggested the patient follow up, along with her husband, with Dr.  Excell Seltzer.   I have suggested the patient have a chest x-ray, digoxin level today and  in 3-4 months she will have lipids and BMP.     Cecil Cranker, MD, [facility]  Electronically Signed    EJL/MedQ  DD: 03/16/2007  DT: 03/16/2007  Job #: [id number]

## 2011-04-02 NOTE — Assessment & Plan Note (Signed)
 HEALTHCARE                            CARDIOLOGY OFFICE NOTE   CLIFFIE, GINGRAS                    MRN:          [id number]  DATE:03/16/2007                            DOB:          04-17-1935    I have suggested the patient have a chest x-ray, digoxin level today and  in 3-4 months she will have lipids and BMP.     Cecil Cranker, MD, [facility]     EJL/MedQ  DD: 03/16/2007  DT: 03/16/2007  Job #: [id number]

## 2011-04-02 NOTE — Assessment & Plan Note (Signed)
[facility]                             PULMONARY OFFICE NOTE   Felicia, [name]                    MRN:          [id number]  DATE:12/27/2006                            DOB:          [birth year]    HISTORY OF PRESENT ILLNESS:  The patient is a 76 year old white female  patient of Dr. Jodelle Green who has a known history of atrial fibrillation,  on chronic anticoagulation therapy, hyperlipidemia, hypertension, who  presents for an acute office visit. The patient complains of a 1-day  history of nasal congestion, sinus pain and pressure, fever, chills and  body aches, T-max of 100.6. The patient denies any hemoptysis, recent  travel, antibody use, chest pain or shortness of breath.   PAST MEDICAL HISTORY:  Reviewed.   CURRENT MEDICATIONS:  Reviewed.   PHYSICAL EXAMINATION:  The patient is a pleasant female in no acute  distress. She is afebrile with stable vital signs. O2 saturation is 97%  on room air.  HEENT: Nasal mucosa is slightly pale. Nontender sinuses. Posterior  pharynx is clear. Tympanic membranes are normal.  NECK: Supple without adenopathy. No JVD.  LUNGS: Lung sounds are clear to auscultation bilaterally.  CARDIAC: Regular rate.  ABDOMEN: Soft and nontender.  EXTREMITIES: Warm without edema.   IMPRESSION/PLAN:  Acute upper respiratory infection, suspect it is viral  in nature. The patient is to begin Mucinex DM twice daily, Tylenol as  needed for fever, saline nasal spray along with Rhinocort twice daily.  The patient is to increase fluid intake and rest. The patient is to  return if no improvement or symptoms worsen.      Rubye Oaks, NP  Electronically Signed      Lonzo Cloud. Kriste Basque, MD  Electronically Signed   TP/MedQ  DD: 12/27/2006  DT: 12/27/2006  Job #: [id number]

## 2011-04-02 NOTE — H&P (Signed)
NAMESHAMICA, [name]             ACCOUNT NO.:  [id number]   MEDICAL RECORD NO.:  [id number]          PATIENT TYPE:  INP   LOCATION:  3008                         FACILITY:  [facility]   PHYSICIAN:  Genene Churn. Love, M.D.    DATE OF BIRTH:  September 06, 1935   DATE OF ADMISSION:  10/03/2006  DATE OF DISCHARGE:                                HISTORY & PHYSICAL   CHIEF COMPLAINT:  This is one of multiple [facility] admissions for  this 76 year old, right-handed, white, married female admitted from the  emergency room for evaluation of quadriparesis, dysarthria and to rule out  stroke.   HISTORY OF PRESENT ILLNESS:  [name] has a history of peripheral  neuropathy and has been on Gabapentin therapy for that.  She has episodes  while taking a nap of being difficult to arouse and on occasion will wake up  with quadriparesis.  There has been a question of TIA and stroke versus  sleep paralysis as a cause for her symptomatology.  She was last evaluated  for this in August in 2006, at which time an MRI study of the brain was  normal except for evidence of small vessel disease and no evidence of acute  stroke.  An MRA was unremarkable.  Telemetry showed no ectopy.  INR was 2.1.  She has a known history of sleep apnea and has been using CPAP. There has  also been a question of sleep paralysis as a cause for her symptomatology.  She went to bed feeling well last night and slept throughout the night, got  up and fixed coffee, but then went to the recliner, laid down and was very  difficult to arouse per husband.  He was unable to arouse her.  She then got  up with quadriparesis and slurred speech this afternoon and came to the  emergency room.  CT scan of the brain showed old small vessel disease.  An  MRI study the brain is pending.   PAST MEDICAL HISTORY:  1. Open heart surgery with closure of an ASD in 1982.  2. Atrial fibrillation with DC cardioversion in 1982 and 1993.  3. TIA versus sleep  paralysis in August 2006.  4. Right rotator cuff surgery in 2007, with residual left rotator cuff      disease.  5. Ovarian surgery with adhesions in 1993.  6. Vaginal hysterectomy in 1987.  7. Cystocele and rectocele repair in 1987.  8. Tubal ligation in 1969.  9. History of sleep apnea.  10.She has been followed for peripheral neuropathy.   ALLERGIES:  DEMEROL and CODEINE.  Weakness with LIPITOR and ZOCOR.   CURRENT MEDICATIONS:  1. Metoprolol 50 mg b.i.d.  2. Digoxin 25 mg daily.  3. Darvocet N-100 as needed.  4. Coumadin 5 mg daily except 2.5 mg on Wednesday.  5. Triamterene/hydrochlorothiazide daily.  6. Quinapril 20 mg daily.  7. Kay Ciel 20 mEq daily.  8. Rhinocort one spray in one nostril per day.  9. Fioricet as needed for headache.  10.Zetia 10 mg daily.  11.Gabapentin 300 mg daily.  12.Ocuvite daily.  13.Multivitamins daily.  14.Multivitamins daily.  15.Glucosamine chondroitin once daily.  16.Ambien 5 mg nightly.  17.Estradiol 0.5 mg daily.  18.Prilosec 20 mg daily.  19.Requip 1 mg nightly for restless leg syndrome.  20.Crestor 2.5 mg daily.   FAMILY HISTORY:  Her mother died at age 65 from ALS.  Father died at age 52  from motor vehicle accident.  She has a brother who died at age 78 from  myocardial infarction.  Brother died at 79 for myocardial infarction.  Brother died at 39 from myocardial infarction.  She has a brother age 80 who  is alive and has had open heart surgery.  She has a sister who died at age  1 from cancer of the pancreas.  She has another sister who died at age 51  from melanoma.  She has three children, son 33 and daughters 5 and 52,  living and well.   SOCIAL HISTORY:  She does not drink alcohol or smoke cigarettes.   PHYSICAL EXAMINATION:  GENERAL:  A well-developed, white female.  VITAL SIGNS:  Blood pressure in right and left arm 160/81, 170/80, heart  rate 64, no bruits.  NEUROLOGIC:  Mental status showed she was alert.  She would  follow some  commands.  She spoke with severe dysarthria and facial contortions.  She had  difficulty moving her arms and legs, but at times would hold up two fingers  with each hand.  She would nod her head yes and no.  She tried to write.  Her cranial nerve examination showed visual fields to be full.  Both disks  were seen and flat.  The extraocular movements were full.  Corneals were  present.  Facial sensation was equal.  There is a facial motor asymmetry  with grimacing of her face, but she can do this bilaterally.  Hearing was  decreased.  Air conduction was greater than bone conduction.  Tongue was  midline.  She did have gags.  Motor examination revealed that she could  barely move her extremities, but could move each extremity, although not  well against gravity.  Her right leg seemed to be weaker to me than left,  but earlier her left leg was supposedly weaker than her right.  She had deep  tendon reflexes which were 1-2+.  Plantar responses were downgoing.  General  examination revealed tympanic membranes to be clear.  HEART:  There was a mid systolic sound in her heart.  No abnormal sounds in  her lung fields.  ABDOMEN:  No enlargement of liver, spleen or kidneys.   LABORATORY DATA:  CT scan showed evidence of old small vessel disease.  White blood cell count 5500, hemoglobin 14.9, hematocrit 43.3, platelets  291,000.  INR 2.4.  Sodium of 143, potassium 3.3, chloride 104, CO2 content  28, BUN 13, creatinine 0.6, glucose 114.  Liver function tests normal.   IMPRESSION:  1. Quadriparesis, rule out brain stem stroke. (344.0)  2. History of atrial septal defect repair.  3. History of hypertension. (796.2)  4. History of atrial fibrillation, status post direct cardioversion x2.      (427.31)  5. History of obstructive sleep apnea. (780.57)  6. Peripheral neuropathy. (256.9)  7. Restless leg syndrome. (333.9) 8. Right rotator cuff surgery, code unknown.  9. Ovarian surgery  with adhesions in 1993.  10.Vaginal hysterectomy 1987.  11.History of cystocele and rectocele repair in 1997.  12.Tubal ligation in 1969.   PLAN:  At this time, obtain MRI study for further evaluation of  the brain  and to rule out possibility of acute brain stem stroke.           ______________________________  Genene Churn. Sandria Manly, M.D.     JML/MEDQ  D:  10/03/2006  T:  10/04/2006  Job:  [id number]   cc:   Lonzo Cloud. Kriste Basque, MD  Cecil Cranker, MD, [facility]

## 2011-04-05 ENCOUNTER — Telehealth: Payer: Self-pay | Admitting: *Deleted

## 2011-04-05 DIAGNOSIS — I4891 Unspecified atrial fibrillation: Secondary | ICD-10-CM

## 2011-04-05 DIAGNOSIS — I10 Essential (primary) hypertension: Secondary | ICD-10-CM

## 2011-04-05 NOTE — Telephone Encounter (Signed)
See phone note

## 2011-04-07 NOTE — Telephone Encounter (Signed)
Pt is coming in 04/09/11 for repeat labs. [name]

## 2011-04-07 NOTE — Telephone Encounter (Signed)
Okay 

## 2011-04-09 ENCOUNTER — Other Ambulatory Visit (INDEPENDENT_AMBULATORY_CARE_PROVIDER_SITE_OTHER): Payer: Medicare Other | Admitting: *Deleted

## 2011-04-09 DIAGNOSIS — I10 Essential (primary) hypertension: Secondary | ICD-10-CM

## 2011-04-09 DIAGNOSIS — I4891 Unspecified atrial fibrillation: Secondary | ICD-10-CM

## 2011-04-09 LAB — BASIC METABOLIC PANEL
Chloride: 97 mEq/L (ref 96–112)
Creatinine, Ser: 0.9 mg/dL (ref 0.4–1.2)
GFR: 64.31 mL/min (ref >60.00)
Potassium: 3.2 mEq/L — ABNORMAL LOW (ref 3.5–5.1)

## 2011-04-14 ENCOUNTER — Telehealth: Payer: Self-pay | Admitting: *Deleted

## 2011-04-14 ENCOUNTER — Encounter: Payer: Self-pay | Admitting: *Deleted

## 2011-04-14 DIAGNOSIS — I4891 Unspecified atrial fibrillation: Secondary | ICD-10-CM

## 2011-04-14 NOTE — Telephone Encounter (Signed)
See phone note

## 2011-04-15 ENCOUNTER — Encounter: Payer: Self-pay | Admitting: Cardiovascular Disease

## 2011-04-19 ENCOUNTER — Ambulatory Visit: Payer: Medicare Other

## 2011-04-21 ENCOUNTER — Ambulatory Visit
Admission: RE | Admit: 2011-04-21 | Discharge: 2011-04-21 | Disposition: A | Discharge status: Home / Self Care | Payer: Medicare Other | Source: Ambulatory Visit | Attending: Pulmonary Disease | Admitting: Pulmonary Disease

## 2011-04-21 ENCOUNTER — Other Ambulatory Visit (INDEPENDENT_AMBULATORY_CARE_PROVIDER_SITE_OTHER): Payer: Medicare Other | Admitting: *Deleted

## 2011-04-21 DIAGNOSIS — I4891 Unspecified atrial fibrillation: Secondary | ICD-10-CM

## 2011-04-21 DIAGNOSIS — Z1231 Encounter for screening mammogram for malignant neoplasm of breast: Secondary | ICD-10-CM

## 2011-04-21 LAB — BASIC METABOLIC PANEL
CO2: 36 mEq/L — ABNORMAL HIGH (ref 19–32)
GFR: 72.52 mL/min (ref >60.00)
Glucose, Bld: 111 mg/dL — ABNORMAL HIGH (ref 70–99)
Potassium: 3.9 mEq/L (ref 3.5–5.1)
Sodium: 141 mEq/L (ref 135–145)

## 2011-04-23 ENCOUNTER — Telehealth: Payer: Self-pay | Admitting: Pulmonary Disease

## 2011-04-26 ENCOUNTER — Other Ambulatory Visit: Payer: Self-pay | Admitting: *Deleted

## 2011-04-26 ENCOUNTER — Ambulatory Visit (INDEPENDENT_AMBULATORY_CARE_PROVIDER_SITE_OTHER): Payer: Medicare Other | Admitting: *Deleted

## 2011-04-26 DIAGNOSIS — I4891 Unspecified atrial fibrillation: Secondary | ICD-10-CM

## 2011-04-26 DIAGNOSIS — Z8679 Personal history of other diseases of the circulatory system: Secondary | ICD-10-CM

## 2011-04-26 MED ORDER — FLUTICASONE PROPIONATE 50 MCG/ACT NA SUSP: NASAL | Status: DC

## 2011-04-26 MED ORDER — CLORAZEPATE DIPOTASSIUM 7.5 MG PO TABS: 7.5000 mg | ORAL_TABLET | Freq: Three times a day (TID) | ORAL | Status: AC | PRN

## 2011-04-26 NOTE — Telephone Encounter (Signed)
Per SN: ok for Clorazepate 7.5mg  # 90 1 po tid prn nerves refill x 5

## 2011-04-26 NOTE — Telephone Encounter (Signed)
Called and spoke with pt. Pt aware rx sent to pharmacy.   

## 2011-04-26 NOTE — Telephone Encounter (Signed)
Called and spoke with pt.  Pt is requesting rx for Tranxene.  I do not see this med on pt's current med list.  Pt states SN prescribed this for her awhile ago.  Pt states she has a "a lot going on" in her life at the moment"- a daughter going through a divorce, a son staying with her who has a lot of problems.  And pt states this is causing a lot of stress on her heart.  Requesting rx for the Tranxene.  Please advise. Thank you.

## 2011-04-26 NOTE — Telephone Encounter (Signed)
[email or web address]

## 2011-04-26 NOTE — Telephone Encounter (Signed)
lmomtcb  

## 2011-04-27 ENCOUNTER — Ambulatory Visit: Payer: Medicare Other | Admitting: Cardiovascular Disease

## 2011-04-29 ENCOUNTER — Other Ambulatory Visit: Payer: Self-pay | Admitting: *Deleted

## 2011-04-29 MED ORDER — FLUTICASONE PROPIONATE 50 MCG/ACT NA SUSP: NASAL | Status: DC

## 2011-05-05 NOTE — Discharge Summary (Signed)
[name], [name]             ACCOUNT NO.:  [id number]  MEDICAL RECORD NO.:  [id number]           PATIENT TYPE:  I  LOCATION:  2030                         FACILITY:  [facility]  PHYSICIAN:  Rollene Rotunda, MD, FACCDATE OF BIRTH:  09/25/1935  DATE OF ADMISSION:  03/08/2011 DATE OF DISCHARGE:  03/12/2011                              DISCHARGE SUMMARY   PRIMARY CARDIOLOGIST:  [name]. Excell Seltzer, MD  DISCHARGE DIAGNOSES: 1. Acute-on-chronic congestive heart failure secondary to tricuspid     regurgitation/right heart disease.     a.     Weight on admission 69.1 kg to 65.5 kg on date of discharge.      BNP 722 on admission, 311 on March 11, 2011.     b.     A 2-D echocardiogram, March 10, 2011:  Left ventricular      cavity size normal, mild left ventricular hypertrophy, left      ventricular ejection fraction 60%.  Mild aortic insufficiency.      Mild mitral regurgitation.  Left atrial enlargement moderate.  Right ventricle moderately dilated with moderate reduction in      systolic function.  Right atrial enlargement moderate - severe      tricuspid regurgitation.  Pulmonary artery peak pressure 58 mmHg.     c.     Right heart cath, March 12, 2011:  RA 8, RV 46/1, average 6.      PA 45/13, average 26.  Pulmonary capillary wedge pressure 14,      (large V-wave). 2. Valvular disease (moderate - severe tricuspid regurgitation,     moderate mitral regurgitation, see echo above).  SECONDARY DIAGNOSES: 1. Paroxysmal atrial fibrillation.     a.     Status post direct current cardioversion, 1982, 1993.     b.     Chronic Coumadin. 2. Thrombocytopenia, mild. 3. Atrial septal defect.     a.     Status post closure, 1992. 4. History of transient ischemic attack versus sleep paralysis, August     2006. 5. Type A aortic dissection - acute, June 05, 2010.     a.     Status post resuspension of native aortic valve and coronary      artery bypass graft x2 with placement of vein graft to the  distal      left anterior descending and vein graft to obtuse marginal.  Of      note, the patient had nonobstructive disease on cardiac      catheterization at that time. 6. Obstructive sleep apnea. 7. History of hyperlipidemia. 8. Peripheral neuropathy. 9. Chronic venous insufficiency with lower extremity edema. 10.Gastroesophageal reflux disease. 11.Irritable bowel syndrome. 12.Degenerative joint disease/chronic back pain. 13.Fibromyalgia. 14.Depression/anxiety. 15.Status post right rotator cuff repair, 2007. 16.Status post vaginal hysterectomy, 1987. 17.Status post ovarian surgery, 1993. 18.Status post bilateral tubal ligation, 1969.  ALLERGIES AND INTOLERANCES: 1. CODEINE (itching). 2. MEPERIDINE/DEMEROL (hallucinations). 3. ATORVASTATIN (myalgias). 4. SIMVASTATIN (myalgias). 5. NEOMYCIN SULFATE (blisters). 6. BACITRACIN (unknown reaction). 7. ROPINIROLE (sleep paralysis).  PROCEDURES: 1. EKG, March 08, 2011:  Normal sinus rhythm, premature atrial     contractions, 63 bpm, normal axis,  inferolateral flat ST     depression, not significantly changed from prior tracing. 2. Chest x-ray, March 08, 2011:  New pulmonary vascular congestion.     Chronic obstructive lung disease.  Chronic marked cardiomegaly. 3. A 2-D echocardiogram, March 10, 2011:  Please see discharge     diagnoses section under acute congestive heart failure. 4. Right heart catheterization, March 12, 2011:  Please see in     discharge diagnoses under acute congestive heart failure.  HISTORY OF PRESENT ILLNESS:  [name] is a 76 year old Caucasian female with the above-noted complex medical history who has been doing quite well since her surgery in July 2011 working with cardiac rehab and walking one mile up to five times per week without significant limitations.  Unfortunately, over the past 2 weeks, she has noted somewhere in the neighborhood of 7-15 pounds of weight gain, increasing exertional  dyspnea and profound weakness.  She has also noted worsening lower extremity edema.  She reports compliance with her Lasix 80 mg p.o. b.i.d. and her metolazone was increased to twice weekly.  Initially, she saw some improvement but signs and symptoms of volume overload again worsened.  On the day prior to procedure, she had more severe episodes of weakness and dyspnea and decided to present to [facility] ED for further eval.  She denies chest discomfort at any time.  HOSPITAL COURSE:  The patient was admitted and was diuresed with IV Lasix 80 mg IV b.i.d. with good urine output and improvement in symptoms.  Her 2-D echocardiogram raised the concern of significant pulmonary hypertension and therefore, she underwent right heart cath on March 12, 2011.  Of note, she had been switched back to p.o. Lasix as of March 11, 2011.  Luckily, per right heart cath completed by Dr. Rollene Rotunda, there was not significant pulmonary hypertension and therefore, no pulmonary vascular vasodilators have been initiated.  The patient will continue on her preadmission meds (please see discharge med section).  Her Coumadin was restarted on March 11, 2011, and she will have an early appointment at [facility]in Clinic on Mar 16, 2011.  The patient will also have early followup at [facility] with physician assistant, Tereso Newcomer, on Mar 25, 2011, at 11 a.m.  It was reiterated to the patient as to the importance of a low-sodium heart- healthy diet, and she was encouraged to increase her activity slowly in hopes of returning to her prior baseline (formally with good exercise tolerance).  At the time of discharge, the patient received her medication list, followup instructions, post right heart cath instructions, and all questions and concerns were addressed prior to leaving the hospital.  Note, the patient was initiated on digoxin this admission but had heart rates in the 50s with some inconsequential pauses of up  to 2 seconds but given excellent rate control, I did not feel this medication was worth adding to her preadmission medication list.  DISCHARGE LABORATORY DATA:  WBC is 4.1, HGB 10.1, HCT 32.5, PLT count is 127.  WBC differential on admission was within normal limits.  Pro time 16.4, INR 1.30.  Sodium 142, potassium 3.6, chloride 98, bicarb 31, BUN is 19, creatinine 0.92, glucose 112, liver function tests were within normal limits.  On date of admission, albumin 3.7, calcium 8.6.  Three full sets of cardiac enzymes were negative.  BNP on admission 722, recheck on March 11, 2011, was 311.  Total cholesterol 113, triglycerides 43, HDL 34, LDL 70, total cholesterol/HDL ratio situation of 3.3.  TSH 3.823.  MRSA positive.  Blood cultures, no growth to date.  FOLLOWUP PLANS AND APPOINTMENTS: 1. Coumadin Clinic, [facility], Mar 16, 2011,     at 11:35 a.m. 2. Tereso Newcomer, PA-C, at [facility], Mar 25, 2011, at 11:00 a.m.  DISCHARGE MEDICATIONS: 1. Ambien 10 mg one tablet p.o. nightly p.r.n. 2. Bactroban one application topically b.i.d. 3. EC-ASA 81 mg p.o. daily. 4. Fluticasone 1 spray each nostril nightly p.r.n. 5. Gabapentin 300 mg p.o. b.i.d. 6. Glucosamine chondroitin 1500/1200 mg one tablet p.o. b.i.d. 7. Lasix 80 mg one tablet p.o. b.i.d. 8. Metolazone 2.5 mg one tablet on Mondays and Thursdays. 9. Metoprolol tartrate 50 mg p.o. b.i.d. 10.Ocuvite 1 tablet p.o. b.i.d. 11.Omeprazole 20 mg p.o. b.i.d. 12.Potassium chloride 20 mEq p.o. b.i.d. (on Mondays and Thursdays,     the patient takes 2 tablets in the morning and 1 tab in the     evening). 13.Tramadol 50 mg one tablet p.o. q.6 h. p.r.n. 14.Vitamin D3 - 1000 units one tablet p.o. daily. 15.Warfarin 10 mg one tablet daily. 16.Zoloft 50 mg one tablet p.o. nightly.  DURATION OF DISCHARGE ENCOUNTER:  Including physician time was 40 minutes.     Jarrett Ables,  PAC   ______________________________ Rollene Rotunda, MD, [facility]    MS/MEDQ  D:  03/12/2011  T:  03/13/2011  Job:  [id number]  cc:   [name]. Excell Seltzer, MD Tereso Newcomer, PA-C  Electronically Signed by Jarrett Ables PAC on 03/24/2011 01:15:57 PM Electronically Signed by Rollene Rotunda MD [facility] on 05/05/2011 11:25:30 AM

## 2011-05-05 NOTE — Cardiovascular Report (Signed)
  NAMEJONEL, [name]             ACCOUNT NO.:  [id number]  MEDICAL RECORD NO.:  [id number]           PATIENT TYPE:  I  LOCATION:  2030                         FACILITY:  [facility]  PHYSICIAN:  Rollene Rotunda, MD, FACCDATE OF BIRTH:  Oct 28, 1935  DATE OF PROCEDURE:  03/12/2011 DATE OF DISCHARGE:  03/12/2011                           CARDIAC CATHETERIZATION   PROCEDURE:  Right heart catheterization.  INDICATIONS:  Dyspnea and pulmonary hypertension.  PROCEDURE NOTE:  Right heart catheterization performed via right femoral vein.  Vessel was cannulated using anterior wall puncture.  A #7-French venous sheath was inserted via the modified Seldinger technique.  Theone Murdoch catheter was utilized.  The patient tolerated the procedure well.  RESULTS:  Hemodynamics:  RA mean 8, RV 46/1 with a mean of 6, PA 45/13 with a mean of 26, pulmonary capillary pressure mean 14 with a large V- wave, cardiac output/cardiac index (Fick) 3.68/2.1.  CONCLUSION:  The patient has no elevated pulmonary pressures by right heart catheterization or very minimally elevated pressures.  She does have a large CV wave as expected with her tricuspid regurgitation. There is a large V-wave on wedge, which is not explained by echocardiogram findings, though we will need to review this to see if there is evidence of mitral regurgitation or elevated left atrial pressure.     Rollene Rotunda, MD, [facility]     JH/MEDQ  D:  03/12/2011  T:  03/13/2011  Job:  [id number]  cc:   Dr. Excell Seltzer  Electronically Signed by Rollene Rotunda MD [facility] on 05/05/2011 11:25:33 AM

## 2011-05-27 ENCOUNTER — Encounter: Payer: Self-pay | Admitting: Internal Medicine

## 2011-06-01 ENCOUNTER — Encounter: Payer: Self-pay | Admitting: Cardiovascular Disease

## 2011-06-01 ENCOUNTER — Ambulatory Visit (INDEPENDENT_AMBULATORY_CARE_PROVIDER_SITE_OTHER): Payer: Medicare Other | Admitting: Cardiovascular Disease

## 2011-06-01 ENCOUNTER — Ambulatory Visit (INDEPENDENT_AMBULATORY_CARE_PROVIDER_SITE_OTHER): Payer: Medicare Other | Admitting: *Deleted

## 2011-06-01 VITALS — BP 111/65 | HR 60 | Resp 14 | Ht 67.0 in | Wt 152.0 lb

## 2011-06-01 DIAGNOSIS — R0602 Shortness of breath: Secondary | ICD-10-CM

## 2011-06-01 DIAGNOSIS — I50810 Right heart failure, unspecified: Secondary | ICD-10-CM

## 2011-06-01 DIAGNOSIS — I4891 Unspecified atrial fibrillation: Secondary | ICD-10-CM

## 2011-06-01 DIAGNOSIS — I509 Heart failure, unspecified: Secondary | ICD-10-CM

## 2011-06-01 DIAGNOSIS — I7101 Dissection of thoracic aorta: Secondary | ICD-10-CM

## 2011-06-01 DIAGNOSIS — Z8679 Personal history of other diseases of the circulatory system: Secondary | ICD-10-CM

## 2011-06-01 DIAGNOSIS — I5033 Acute on chronic diastolic (congestive) heart failure: Secondary | ICD-10-CM

## 2011-06-01 DIAGNOSIS — I71019 Dissection of thoracic aorta, unspecified: Secondary | ICD-10-CM

## 2011-06-01 LAB — POCT INR: INR: 2.7

## 2011-06-01 LAB — BASIC METABOLIC PANEL
GFR: 72.5 mL/min (ref >60.00)
Glucose, Bld: 116 mg/dL — ABNORMAL HIGH (ref 70–99)
Potassium: 3.5 mEq/L (ref 3.5–5.1)
Sodium: 141 mEq/L (ref 135–145)

## 2011-06-01 NOTE — Patient Instructions (Signed)
Your physician recommends that you schedule a follow-up appointment in: 3 months with Dr. Excell Seltzer with fasting lab work that day.  You had lab work done today.

## 2011-06-21 ENCOUNTER — Encounter: Payer: Self-pay | Admitting: Cardiovascular Disease

## 2011-06-21 NOTE — Progress Notes (Signed)
HPI:  This is a 76 year old woman presenting for follow up evaluation. She has a history of remote ASD repair, paroxysmal atrial fibrillation,Type A  aortic dissection in 2011, and right heart failure with severe tricuspid regurgitation.  The Overall she has been doing well since her last visit. She continues to do regular walking for exercise. She notes occasional pain in her left lower back when she walks. She denies chest pain or tightness. She also notes blood-tinged urine she has an appointment to see a urologist. She complains of generalized fatigue but this has not worsened. She has only mild dyspnea with activity. She maintains a very active lifestyle. She's had no neurologic symptoms. She has no other complaints at this time.  Outpatient Encounter Prescriptions as of 06/01/2011  Medication Sig Dispense Refill  . amoxicillin (AMOXIL) 500 MG capsule Take 500 mg by mouth. Take 4 capsules 1 hour prior dental procedure       . aspirin 81 MG tablet Take 81 mg by mouth daily.        . beta carotene w/minerals (OCUVITE) tablet Take 1 tablet by mouth 2 (two) times daily.        Jennette Banker Sodium 30-100 MG CAPS Take by mouth. [facility] BRAND LAXA-w/ STOOL SOFTNER EVERY EVENING       . Cholecalciferol (VITAMIN D) 1000 UNITS capsule Take 1,000 Units by mouth daily.        . Clorazepate Dipotassium (TRANXENE-T PO) as needed.        . fluticasone (FLONASE) 50 MCG/ACT nasal spray 1-2 sprays in each nostril at bedtime  48 g  3  . furosemide (LASIX) 80 MG tablet Take 80 mg by mouth 2 (two) times daily.        Marland Kitchen gabapentin (NEURONTIN) 300 MG capsule Take 300 mg by mouth 2 (two) times daily.        . metolazone (ZAROXOLYN) 2.5 MG tablet Take One Tablet By Mouth 30 Minutes Prior To Furosemide Dose Every Monday and Thursday morning  90 tablet  3  . metoprolol (LOPRESSOR) 50 MG tablet Take 1 tablet (50 mg total) by mouth 2 (two) times daily.  60 tablet  6  . omeprazole (PRILOSEC) 20 MG capsule Take 20 mg by  mouth 2 (two) times daily.        . potassium chloride SA (K-DUR,KLOR-CON) 20 MEQ tablet TAKE 1 TAB THREE TIMES  DAILY; EXCEPT ON Monday AND Thursday TAKE AN EXTRA POTASSIUM WITH YOUR METOLAZONE      . rosuvastatin (CRESTOR) 10 MG tablet Take 1/2 tab daily      . sertraline (ZOLOFT) 50 MG tablet Take 50 mg by mouth daily.        . traMADol (ULTRAM) 50 MG tablet Take 50 mg by mouth every 6 (six) hours as needed.        . warfarin (COUMADIN) 5 MG tablet Take by mouth as directed.        . zolpidem (AMBIEN) 10 MG tablet Take 10 mg by mouth at bedtime as needed.        Marland Kitchen DISCONTD: mupirocin (BACTROBAN) 2 % ointment         Allergies  Allergen Reactions  . Atorvastatin     REACTION: muscle pain  . Codeine     REACTION: itching  . Ezetimibe     REACTION: INTOL to Zetia w/ cough  . Meperidine Hcl     REACTION: dizziness  . Morphine   . Ropinirole Hydrochloride  REACTION: INTOL to Requip w/ sleep paralysis  . Simvastatin     REACTION: unable to walk--muscle pain    Past Medical History  Diagnosis Date  . OSA (obstructive sleep apnea)   . HTN (hypertension)   . Atrial fibrillation     paroxysmal; on coumadin  . CAD (coronary artery disease)     a. cath 7/11: LM 40%, mild plaque disease in CFX, LAD, and RCA;  b. CABG with S-LAD and S-OM done at time of Aortic dissection repair  . Diastolic heart failure   . Dissection of aorta, thoracic     a. Type A; s/p repair 7/11 with aortic root repair and CABG x 2   . ASD (atrial septal defect)     a. s/p repair 1982 at [facility]  . Right heart failure     a. 2/2 TR and RV dysfxn;  b. echo 4/12: EF 60%, mild LVH, mild AI, mild MR, mod LAE, mod RVE with mod dec. RVSF, mod RAE, mod to severe TR, PASP 58;  c. right heart cath 4/12:  RA mean 8, RV 46/1 with mean 6, PA 45/13 with mean 26, PCWP mean 14, CO 3.68, CI 2.1 (no sig pulmon HTN)  . GERD (gastroesophageal reflux disease)   . IBS (irritable bowel syndrome)   . Esophageal stricture   . Colonic  polyp   . Peripheral neuropathy   . Back pain   . Fibromyalgia   . Anxiety   . HLD (hyperlipidemia)   . Borderline diabetes   . History of TIAs   . DJD (degenerative joint disease)   . Normocytic anemia   . Thrombocytopenia     ROS: Negative except as per HPI  BP 111/65  Pulse 60  Resp 14  Ht 5\' 7"  (1.702 m)  Wt 152 lb (68.947 kg)  BMI 23.81 kg/m2  PHYSICAL EXAM: Pt is alert and oriented, NAD HEENT: normal Neck: JVP - large V waves noted, carotids 2+= without bruits Lungs: CTA bilaterally CV: regular rate and rhythm with 2/6 systolic murmur along the left lower sternal border Abd: soft, NT, Positive BS, mild hepatomegaly Ext: trace bilateral pretibial edema, distal pulses intact and equal Skin: warm/dry no rash  ASSESSMENT AND PLAN:

## 2011-06-21 NOTE — Assessment & Plan Note (Signed)
The patient's volume status appears stable. She is on a combination of furosemide and metolazone. Metabolic panel was checked demonstrating stable renal function and normal potassium. She will continue her current medical program and followup in 3 months.

## 2011-06-21 NOTE — Assessment & Plan Note (Signed)
The patient's in sinus rhythm today. She is on chronic anticoagulation for atrial fibrillation.

## 2011-06-21 NOTE — Assessment & Plan Note (Signed)
The patient is stable. She continues to have regular followup with serial imaging by CT scan.

## 2011-06-29 ENCOUNTER — Encounter: Payer: Medicare Other | Admitting: *Deleted

## 2011-06-29 ENCOUNTER — Telehealth: Payer: Self-pay | Admitting: Pulmonary Disease

## 2011-06-29 ENCOUNTER — Other Ambulatory Visit: Payer: Self-pay | Admitting: Pulmonary Disease

## 2011-06-29 NOTE — Telephone Encounter (Signed)
Per epic, refills sent this morning.  Called spoke with patient, advised rx has been sent.  Pt okay with this and verbalized her understanding.  Will sign off.

## 2011-06-30 ENCOUNTER — Ambulatory Visit (INDEPENDENT_AMBULATORY_CARE_PROVIDER_SITE_OTHER): Payer: Medicare Other | Admitting: *Deleted

## 2011-06-30 DIAGNOSIS — I4891 Unspecified atrial fibrillation: Secondary | ICD-10-CM

## 2011-06-30 DIAGNOSIS — Z8679 Personal history of other diseases of the circulatory system: Secondary | ICD-10-CM

## 2011-06-30 LAB — POCT INR: INR: 1.7

## 2011-07-19 ENCOUNTER — Other Ambulatory Visit: Payer: Self-pay | Admitting: Pulmonary Disease

## 2011-07-20 ENCOUNTER — Telehealth: Payer: Self-pay | Admitting: Pulmonary Disease

## 2011-07-21 MED ORDER — SERTRALINE HCL 50 MG PO TABS: 50.0000 mg | ORAL_TABLET | Freq: Every day | ORAL | Status: DC

## 2011-07-21 MED ORDER — OMEPRAZOLE 20 MG PO CPDR: 20.0000 mg | DELAYED_RELEASE_CAPSULE | Freq: Two times a day (BID) | ORAL | Status: DC

## 2011-07-21 MED ORDER — METOPROLOL TARTRATE 50 MG PO TABS: 50.0000 mg | ORAL_TABLET | Freq: Two times a day (BID) | ORAL | Status: DC

## 2011-07-21 MED ORDER — POTASSIUM CHLORIDE CRYS ER 20 MEQ PO TBCR: EXTENDED_RELEASE_TABLET | ORAL | Status: DC

## 2011-07-21 NOTE — Telephone Encounter (Signed)
Spoke with pt to verify msg. Rxs were sent to [facility] and advised pt to keep Oct appt for future refills. She verbalized understanding.

## 2011-07-27 DIAGNOSIS — H3530 Unspecified macular degeneration: Secondary | ICD-10-CM | POA: Insufficient documentation

## 2011-07-28 ENCOUNTER — Ambulatory Visit (INDEPENDENT_AMBULATORY_CARE_PROVIDER_SITE_OTHER): Payer: Medicare Other | Admitting: Obstetrics and Gynecology

## 2011-07-28 ENCOUNTER — Encounter: Payer: Self-pay | Admitting: Obstetrics and Gynecology

## 2011-07-28 ENCOUNTER — Ambulatory Visit (INDEPENDENT_AMBULATORY_CARE_PROVIDER_SITE_OTHER): Payer: Medicare Other | Admitting: *Deleted

## 2011-07-28 DIAGNOSIS — N950 Postmenopausal bleeding: Secondary | ICD-10-CM

## 2011-07-28 DIAGNOSIS — I4891 Unspecified atrial fibrillation: Secondary | ICD-10-CM

## 2011-07-28 DIAGNOSIS — I729 Aneurysm of unspecified site: Secondary | ICD-10-CM | POA: Insufficient documentation

## 2011-07-28 DIAGNOSIS — N9089 Other specified noninflammatory disorders of vulva and perineum: Secondary | ICD-10-CM

## 2011-07-28 DIAGNOSIS — Z8679 Personal history of other diseases of the circulatory system: Secondary | ICD-10-CM

## 2011-07-28 NOTE — Progress Notes (Signed)
Patient came to see me today because she had noticed some blood in her underwear. She noticed on the outside that she had some moles and that she had bled from several of these moles. She is status post vaginal hysterectomy. She then had a laparoscopic BSO from me at a later date. These were for benign reasons. Significant medical history is that she has atrial fibrillation and is fully anticoagulated on Coumadin. 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 She is up-to-date on mammograms wash was here she wanted me to do a clinical breast exam for completeness.  Breasts: Carefully examine the sitting and lying position. No skin changes or masses.  External genitalia: Patient has 2 moles on each side of the labia. The 2 inner ones on both sides are red and looked nonsuspicious. The 2 outer ones on both sides are black and slightly raised. It looks as if she's bled into them and that was the source of the bleeding.  Assessment: Vulvar lesions. Bleeding from these lesions.  Plan: I think if the patient was not on Coumadin I would've remove them today. I suspect they are all benign lesions and that  the 2 black ones are where she bled from. My best guess is that they will resolve. She has an appointment next week to see Dr. Emily Filbert  who is her dermatologist. She will have her look at them and see if she thinks they need to be biopsied. If they do she will either do it after consultation with her PCP regarding her Coumadin or refer back to me and we will check with him at that  time. If they continue to bleed but looked nonsuspicious to her I suggested she consider freezing them off.

## 2011-08-09 ENCOUNTER — Ambulatory Visit (INDEPENDENT_AMBULATORY_CARE_PROVIDER_SITE_OTHER): Payer: Medicare Other | Admitting: Internal Medicine

## 2011-08-09 ENCOUNTER — Encounter: Payer: Self-pay | Admitting: Internal Medicine

## 2011-08-09 VITALS — BP 124/60 | HR 58 | Ht 67.0 in | Wt 156.0 lb

## 2011-08-09 DIAGNOSIS — K602 Anal fissure, unspecified: Secondary | ICD-10-CM

## 2011-08-09 DIAGNOSIS — K625 Hemorrhage of anus and rectum: Secondary | ICD-10-CM

## 2011-08-09 NOTE — Progress Notes (Signed)
[name] 16-Aug-1935 MRN [id number]    History of Present Illness:  This is a 80 white female with intermittent painless rectal bleeding in small amounts. She has a rectocele and difficult evacuation of the stool. She has constipation and has to strain to have a bowel movement. She is on a stool softer. Her last colonoscopy in September 2009 was normal. A prior colonoscopy in 2004 showed an adenomatous polyp. She has a positive family history of colon cancer in a sibling and colon polyps in her brother. She underwent a graft repair of the ascending aorta in July 2011 for aortic dissection. She has a tricuspid insufficiency. She has been recently hospitalized with congestive heart failure as well.   Past Medical History  Diagnosis Date  . OSA (obstructive sleep apnea)   . HTN (hypertension)   . Atrial fibrillation     paroxysmal; on coumadin  . CAD (coronary artery disease)     a. cath 7/11: LM 40%, mild plaque disease in CFX, LAD, and RCA;  b. CABG with S-LAD and S-OM done at time of Aortic dissection repair  . Diastolic heart failure   . Dissection of aorta, thoracic     a. Type A; s/p repair 7/11 with aortic root repair and CABG x 2   . ASD (atrial septal defect)     a. s/p repair 1982 at [facility]  . Right heart failure     a. 2/2 TR and RV dysfxn;  b. echo 4/12: EF 60%, mild LVH, mild AI, mild MR, mod LAE, mod RVE with mod dec. RVSF, mod RAE, mod to severe TR, PASP 58;  c. right heart cath 4/12:  RA mean 8, RV 46/1 with mean 6, PA 45/13 with mean 26, PCWP mean 14, CO 3.68, CI 2.1 (no sig pulmon HTN)  . GERD (gastroesophageal reflux disease)   . IBS (irritable bowel syndrome)   . Esophageal stricture   . Colonic polyp   . Peripheral neuropathy   . Back pain   . Fibromyalgia   . Anxiety   . HLD (hyperlipidemia)   . Borderline diabetes   . History of TIAs   . DJD (degenerative joint disease)   . Normocytic anemia   . Thrombocytopenia   . Macular degeneration   . Aneurysm  2011    Disecting aortic aneurysm  . Depression   . Atrial septal defect   . Esophageal dysmotilities    Past Surgical History  Procedure Date  . Asd repair 1982    [facility]  . Rotator cuff repair 2007    right  . Cardioversion     x 3  . Tubal ligation   . Appendectomy   . Vaginal hysterectomy 1987    A/P Repair With Cystocele and rectocele repair  . Tonsillectomy   . Oophorectomy 1994    BSO  . Pelvic laparoscopy 1994    reports that she has never smoked. She has never used smokeless tobacco. She reports that she does not drink alcohol or use illicit drugs. family history includes ALS in her mother; Colon cancer in her sister; Diabetes in her mother; Heart attack in her brother; Heart disease in her brother; Hypertension in her mother; Lung cancer in her brother; Melanoma in her sister; Osteoporosis in her sister; and Pancreatic cancer in her sister. Allergies  Allergen Reactions  . Atorvastatin     REACTION: muscle pain  . Codeine     REACTION: itching  . Erythromycin   . Ezetimibe  REACTION: INTOL to Zetia w/ cough  . Meperidine Hcl     REACTION: dizziness  . Morphine   . Ropinirole Hydrochloride     REACTION: INTOL to Requip w/ sleep paralysis  . Simvastatin     REACTION: unable to walk--muscle pain  . Triple Antibiotic         Review of Systems: Denies dysphagia, odynophagia or abdominal pain. Occasional pain in tailbone when she sits  The remainder of the 10  point ROS is negative except as outlined in H&P   Physical Exam: General appearance  Well developed, in no distress. Eyes- non icteric. HEENT nontraumatic, normocephalic. Mouth no lesions, tongue papillated, no cheilosis. Neck supple without adenopathy, thyroid not enlarged, no carotid bruits, right jugular vein distended. Lungs Clear to auscultation bilaterally, post thoracotomy scar. Cor normal S1 normal S2, regular rhythm , no murmur,  quiet precordium. Abdomen large nontender liver 4 cm below  right costal margin, No ascites. No tenderness. Rectal: and anoscopic exam reveals small external hemorrhoids. Several superficial anal fissures with small amounts of bright red blood in the anal canal. Stool was Hemoccult-positive. No evidence of proctitis.  Extremities no pedal edema. Status post venous bypass , trace edema.  Skin no lesions. Neurological alert and oriented x 3. Psychological normal mood and affect.  Assessment and Plan:  Problem #1 Small-volume rectal bleeding from anal fissures. Patient is on Coumadin. She strains excessively because of a rectocele which causes her to bleed. He have discussed manual evacuation of the stool and applying abdominal pressure. She will use Nupercainal ointment over the couner. She will also increase her stool softer to 2 a day.  Problem #2 Family history of colon cancer. Patient is due for a colonoscopy in September 2014.   08/09/2011 [name]

## 2011-08-09 NOTE — Patient Instructions (Addendum)
CC: Dr Nadel 

## 2011-08-11 ENCOUNTER — Ambulatory Visit (INDEPENDENT_AMBULATORY_CARE_PROVIDER_SITE_OTHER): Payer: Medicare Other | Admitting: *Deleted

## 2011-08-11 DIAGNOSIS — Z8679 Personal history of other diseases of the circulatory system: Secondary | ICD-10-CM

## 2011-08-11 DIAGNOSIS — I4891 Unspecified atrial fibrillation: Secondary | ICD-10-CM

## 2011-08-12 ENCOUNTER — Other Ambulatory Visit: Payer: Self-pay | Admitting: Dermatology

## 2011-08-18 LAB — CARDIAC PANEL(CRET KIN+CKTOT+MB+TROPI)
CK, MB: 1.6
Relative Index: INVALID
Troponin I: 0.01

## 2011-08-18 LAB — CBC
Hemoglobin: 13.5
MCHC: 34
MCV: 99.4
RBC: 4.01
WBC: 7.2

## 2011-08-18 LAB — COMPREHENSIVE METABOLIC PANEL
ALT: 16
AST: 20
CO2: 31
Calcium: 9.6
Chloride: 101
Creatinine, Ser: 0.65
GFR calc Af Amer: >60
GFR calc non Af Amer: >60
Glucose, Bld: 124 — ABNORMAL HIGH
Total Bilirubin: 0.7

## 2011-08-18 LAB — PROTIME-INR
INR: 1.1
Prothrombin Time: 14.1

## 2011-08-18 LAB — DIFFERENTIAL
Basophils Absolute: 0
Eosinophils Absolute: 0.1
Eosinophils Relative: 2
Lymphocytes Relative: 24
Neutrophils Relative %: 67

## 2011-08-18 LAB — FUNGUS CULTURE W SMEAR

## 2011-08-18 LAB — TSH: TSH: 2.203

## 2011-08-18 LAB — POCT CARDIAC MARKERS
CKMB, poc: 1.8
Myoglobin, poc: 66.8

## 2011-08-31 ENCOUNTER — Ambulatory Visit: Payer: Medicare Other | Admitting: Cardiovascular Disease

## 2011-08-31 ENCOUNTER — Ambulatory Visit (INDEPENDENT_AMBULATORY_CARE_PROVIDER_SITE_OTHER): Payer: Medicare Other | Admitting: Cardiovascular Disease

## 2011-08-31 ENCOUNTER — Ambulatory Visit (INDEPENDENT_AMBULATORY_CARE_PROVIDER_SITE_OTHER): Payer: Medicare Other | Admitting: *Deleted

## 2011-08-31 ENCOUNTER — Encounter: Payer: Self-pay | Admitting: Cardiovascular Disease

## 2011-08-31 VITALS — BP 114/60 | HR 60 | Resp 18 | Ht 66.0 in | Wt 156.0 lb

## 2011-08-31 DIAGNOSIS — I509 Heart failure, unspecified: Secondary | ICD-10-CM

## 2011-08-31 DIAGNOSIS — I5032 Chronic diastolic (congestive) heart failure: Secondary | ICD-10-CM

## 2011-08-31 DIAGNOSIS — Z8679 Personal history of other diseases of the circulatory system: Secondary | ICD-10-CM

## 2011-08-31 DIAGNOSIS — I4891 Unspecified atrial fibrillation: Secondary | ICD-10-CM

## 2011-08-31 DIAGNOSIS — I50810 Right heart failure, unspecified: Secondary | ICD-10-CM

## 2011-08-31 DIAGNOSIS — I7101 Dissection of thoracic aorta: Secondary | ICD-10-CM

## 2011-08-31 DIAGNOSIS — I71019 Dissection of thoracic aorta, unspecified: Secondary | ICD-10-CM

## 2011-08-31 LAB — POCT INR: INR: 2.3

## 2011-08-31 MED ORDER — AZITHROMYCIN 250 MG PO TABS: ORAL_TABLET | ORAL | Status: AC

## 2011-08-31 NOTE — Patient Instructions (Signed)
Your physician has requested that you have an echocardiogram. Echocardiography is a painless test that uses sound waves to create images of your heart. It provides your doctor with information about the size and shape of your heart and how well your heart's chambers and valves are working. This procedure takes approximately one hour. There are no restrictions for this procedure.  Your physician wants you to follow-up in: 3 MONTHS.  You will receive a reminder letter in the mail two months in advance. If you don't receive a letter, please call our office to schedule the follow-up appointment.  Your physician recommends that you continue on your current medications as directed. Please refer to the Current Medication list given to you today.

## 2011-09-01 ENCOUNTER — Encounter: Payer: Self-pay | Admitting: Cardiovascular Disease

## 2011-09-01 ENCOUNTER — Other Ambulatory Visit: Payer: Self-pay | Admitting: Thoracic Surgery (Cardiothoracic Vascular Surgery)

## 2011-09-01 DIAGNOSIS — I7101 Dissection of thoracic aorta: Secondary | ICD-10-CM

## 2011-09-01 NOTE — Assessment & Plan Note (Signed)
The patient is in sinus rhythm. She is doing well on long-term Coumadin.

## 2011-09-01 NOTE — Assessment & Plan Note (Addendum)
The patient's volume status looks good today. She has required Lasix 80 mg twice daily and Zaroxolyn 2 days per week. Her weight has fluctuated but overall is stable. I recommended a repeat echocardiogram to reassess her right ventricular function and tricuspid regurgitation. Will continue her current medications.  She sees Dr. Kriste Basque next week and I'm going to request that he draw a metabolic panel with her routine labs so that we keep a close eye on her potassium and renal function.

## 2011-09-01 NOTE — Progress Notes (Signed)
HPI:  [name] is a 76 year old woman presenting for followup evaluation. The patient has a history of remote surgical ASD repair, Type A. Aortic dissection in 2011 requiring emergency surgical repair and 2 vessel bypass surgery, paroxysmal atrial fib, and right heart failure with severe tricuspid regurgitation.  From a symptomatic standpoint, she is doing fairly well. She complains of some fatigue but she has been able to maintain a very active lifestyle. She's had some upper respiratory symptoms over the last week and has complained of a nonproductive cough, sinus congestion, and sore throat. She is concerned that her symptoms haven't improved. She denies orthopnea, PND, or edema. She continues to walk one to 2 miles every day. She denies chest pain or palpitations at present.  Outpatient Encounter Prescriptions as of 08/31/2011  Medication Sig Dispense Refill  . amoxicillin (AMOXIL) 500 MG capsule Take 500 mg by mouth. Take 4 capsules 1 hour prior dental procedure       . aspirin 81 MG tablet Take 81 mg by mouth daily.        . beta carotene w/minerals (OCUVITE) tablet Take 1 tablet by mouth 2 (two) times daily.        Jennette Banker Sodium 30-100 MG CAPS Take by mouth. [facility] BRAND LAXA-w/ STOOL SOFTNER EVERY EVENING       . Cholecalciferol (VITAMIN D) 1000 UNITS capsule Take 1,000 Units by mouth daily.        . Clorazepate Dipotassium (TRANXENE-T PO) as needed.        . fluticasone (FLONASE) 50 MCG/ACT nasal spray 1-2 sprays in each nostril at bedtime  48 g  3  . furosemide (LASIX) 80 MG tablet Take 80 mg by mouth 2 (two) times daily.        Marland Kitchen gabapentin (NEURONTIN) 300 MG capsule Take 300 mg by mouth 2 (two) times daily.        . metolazone (ZAROXOLYN) 2.5 MG tablet Take One Tablet By Mouth 30 Minutes Prior To Furosemide Dose Every Monday and Thursday morning  90 tablet  3  . metoprolol (LOPRESSOR) 50 MG tablet Take 1 tablet (50 mg total) by mouth 2 (two) times daily.  180 tablet  0  .  omeprazole (PRILOSEC) 20 MG capsule Take 1 capsule (20 mg total) by mouth 2 (two) times daily.  180 capsule  0  . potassium chloride SA (K-DUR,KLOR-CON) 20 MEQ tablet TAKE 1 TAB THREE TIMES  DAILY; EXCEPT ON Monday AND Thursday TAKE AN EXTRA POTASSIUM WITH YOUR METOLAZONE  420 tablet  0  . rosuvastatin (CRESTOR) 10 MG tablet Take 1/2 tab daily      . sertraline (ZOLOFT) 50 MG tablet Take 1 tablet (50 mg total) by mouth daily.  90 tablet  0  . traMADol (ULTRAM) 50 MG tablet TAKE 1 TABLET THREE TIMES DAILY AS NEEDED FOR PAIN  90 tablet  3  . warfarin (COUMADIN) 5 MG tablet Take by mouth as directed.        . zolpidem (AMBIEN) 10 MG tablet Take 10 mg by mouth at bedtime as needed.        Marland Kitchen azithromycin (ZITHROMAX Z-PAK) 250 MG tablet Take 2 tablets (500 mg) on  Day 1,  followed by 1 tablet (250 mg) once daily on Days 2 through 5.  6 each  0    Allergies  Allergen Reactions  . Atorvastatin     REACTION: muscle pain  . Codeine     REACTION: itching  . Erythromycin   .  Ezetimibe     REACTION: INTOL to Zetia w/ cough  . Meperidine Hcl     REACTION: dizziness  . Morphine   . Ropinirole Hydrochloride     REACTION: INTOL to Requip w/ sleep paralysis  . Simvastatin     REACTION: unable to walk--muscle pain  . Triple Antibiotic     Past Medical History  Diagnosis Date  . OSA (obstructive sleep apnea)   . HTN (hypertension)   . Atrial fibrillation     paroxysmal; on coumadin  . CAD (coronary artery disease)     a. cath 7/11: LM 40%, mild plaque disease in CFX, LAD, and RCA;  b. CABG with S-LAD and S-OM done at time of Aortic dissection repair  . Diastolic heart failure   . Dissection of aorta, thoracic     a. Type A; s/p repair 7/11 with aortic root repair and CABG x 2   . ASD (atrial septal defect)     a. s/p repair 1982 at [facility]  . Right heart failure     a. 2/2 TR and RV dysfxn;  b. echo 4/12: EF 60%, mild LVH, mild AI, mild MR, mod LAE, mod RVE with mod dec. RVSF, mod RAE, mod to  severe TR, PASP 58;  c. right heart cath 4/12:  RA mean 8, RV 46/1 with mean 6, PA 45/13 with mean 26, PCWP mean 14, CO 3.68, CI 2.1 (no sig pulmon HTN)  . GERD (gastroesophageal reflux disease)   . IBS (irritable bowel syndrome)   . Esophageal stricture   . Colonic polyp   . Peripheral neuropathy   . Back pain   . Fibromyalgia   . Anxiety   . HLD (hyperlipidemia)   . Borderline diabetes   . History of TIAs   . DJD (degenerative joint disease)   . Normocytic anemia   . Thrombocytopenia   . Macular degeneration   . Aneurysm 2011    Disecting aortic aneurysm  . Depression   . Atrial septal defect   . Esophageal dysmotilities     ROS: Negative except as per HPI  BP 114/60  Pulse 60  Resp 18  Ht 5\' 6"  (1.676 m)  Wt 156 lb (70.761 kg)  BMI 25.18 kg/m2  PHYSICAL EXAM: Pt is alert and oriented, NAD HEENT: normal Neck: JVP - large V waves noted, carotids 2+= without bruits Lungs: CTA bilaterally CV: RRR with a blowing grade 2/6 systolic murmur along the left lower sternal border Abd: soft, NT, Positive BS, mild hepatomegaly Ext: no C/C/E, distal pulses intact and equal Skin: warm/dry no rash  EKG:  Normal sinus rhythm 59 beats per minute, LVH with repolarization abnormality  ASSESSMENT AND PLAN:

## 2011-09-01 NOTE — Assessment & Plan Note (Signed)
She is followed by Dr. Cornelius Moras and undergoes surveillance chest CTs on a yearly basis.

## 2011-09-06 ENCOUNTER — Ambulatory Visit (HOSPITAL_COMMUNITY): Payer: Medicare Other | Attending: Cardiovascular Disease | Admitting: Radiology

## 2011-09-06 DIAGNOSIS — I059 Rheumatic mitral valve disease, unspecified: Secondary | ICD-10-CM | POA: Insufficient documentation

## 2011-09-06 DIAGNOSIS — I509 Heart failure, unspecified: Secondary | ICD-10-CM | POA: Insufficient documentation

## 2011-09-06 DIAGNOSIS — I079 Rheumatic tricuspid valve disease, unspecified: Secondary | ICD-10-CM | POA: Insufficient documentation

## 2011-09-06 DIAGNOSIS — I379 Nonrheumatic pulmonary valve disorder, unspecified: Secondary | ICD-10-CM | POA: Insufficient documentation

## 2011-09-06 DIAGNOSIS — I5032 Chronic diastolic (congestive) heart failure: Secondary | ICD-10-CM

## 2011-09-07 ENCOUNTER — Ambulatory Visit (INDEPENDENT_AMBULATORY_CARE_PROVIDER_SITE_OTHER): Payer: Medicare Other | Admitting: Pulmonary Disease

## 2011-09-07 ENCOUNTER — Encounter: Payer: Self-pay | Admitting: Pulmonary Disease

## 2011-09-07 ENCOUNTER — Telehealth: Payer: Self-pay | Admitting: Pulmonary Disease

## 2011-09-07 ENCOUNTER — Other Ambulatory Visit (INDEPENDENT_AMBULATORY_CARE_PROVIDER_SITE_OTHER): Payer: Medicare Other

## 2011-09-07 DIAGNOSIS — I4891 Unspecified atrial fibrillation: Secondary | ICD-10-CM

## 2011-09-07 DIAGNOSIS — I10 Essential (primary) hypertension: Secondary | ICD-10-CM

## 2011-09-07 DIAGNOSIS — I2510 Atherosclerotic heart disease of native coronary artery without angina pectoris: Secondary | ICD-10-CM

## 2011-09-07 DIAGNOSIS — R7309 Other abnormal glucose: Secondary | ICD-10-CM

## 2011-09-07 DIAGNOSIS — I7101 Dissection of thoracic aorta: Secondary | ICD-10-CM

## 2011-09-07 DIAGNOSIS — R0600 Dyspnea, unspecified: Secondary | ICD-10-CM

## 2011-09-07 DIAGNOSIS — I872 Venous insufficiency (chronic) (peripheral): Secondary | ICD-10-CM

## 2011-09-07 DIAGNOSIS — F341 Dysthymic disorder: Secondary | ICD-10-CM

## 2011-09-07 DIAGNOSIS — F419 Anxiety disorder, unspecified: Secondary | ICD-10-CM

## 2011-09-07 DIAGNOSIS — F411 Generalized anxiety disorder: Secondary | ICD-10-CM

## 2011-09-07 DIAGNOSIS — R0609 Other forms of dyspnea: Secondary | ICD-10-CM

## 2011-09-07 DIAGNOSIS — Z23 Encounter for immunization: Secondary | ICD-10-CM

## 2011-09-07 DIAGNOSIS — R0989 Other specified symptoms and signs involving the circulatory and respiratory systems: Secondary | ICD-10-CM

## 2011-09-07 DIAGNOSIS — I5033 Acute on chronic diastolic (congestive) heart failure: Secondary | ICD-10-CM

## 2011-09-07 DIAGNOSIS — G609 Hereditary and idiopathic neuropathy, unspecified: Secondary | ICD-10-CM

## 2011-09-07 DIAGNOSIS — R609 Edema, unspecified: Secondary | ICD-10-CM

## 2011-09-07 DIAGNOSIS — M1990 Unspecified osteoarthritis, unspecified site: Secondary | ICD-10-CM

## 2011-09-07 DIAGNOSIS — E7800 Pure hypercholesterolemia, unspecified: Secondary | ICD-10-CM

## 2011-09-07 LAB — BASIC METABOLIC PANEL
BUN: 28 mg/dL — ABNORMAL HIGH (ref 6–23)
Chloride: 94 mEq/L — ABNORMAL LOW (ref 96–112)
GFR: 76.75 mL/min (ref >60.00)
Potassium: 3.2 mEq/L — ABNORMAL LOW (ref 3.5–5.1)
Sodium: 140 mEq/L (ref 135–145)

## 2011-09-07 LAB — CBC WITH DIFFERENTIAL/PLATELET
Basophils Relative: 0.5 % (ref 0.0–3.0)
Eosinophils Absolute: 0 10*3/uL (ref 0.0–0.7)
Eosinophils Relative: 0.7 % (ref 0.0–5.0)
HCT: 33.9 % — ABNORMAL LOW (ref 36.0–46.0)
Lymphs Abs: 0.9 10*3/uL (ref 0.7–4.0)
MCHC: 33.7 g/dL (ref 30.0–36.0)
MCV: 91.7 fl (ref 78.0–100.0)
Monocytes Absolute: 0.6 10*3/uL (ref 0.1–1.0)
Platelets: 136 10*3/uL — ABNORMAL LOW (ref 150.0–400.0)
RBC: 3.7 Mil/uL — ABNORMAL LOW (ref 3.87–5.11)
WBC: 5.6 10*3/uL (ref 4.5–10.5)

## 2011-09-07 LAB — HEPATIC FUNCTION PANEL
ALT: 13 U/L (ref 0–35)
AST: 25 U/L (ref 0–37)
Bilirubin, Direct: 0.3 mg/dL (ref 0.0–0.3)
Total Bilirubin: 1.6 mg/dL — ABNORMAL HIGH (ref 0.3–1.2)

## 2011-09-07 LAB — LIPID PANEL
HDL: 51.5 mg/dL (ref >39.00)
LDL Cholesterol: 82 mg/dL (ref 0–99)
Total CHOL/HDL Ratio: 3
Triglycerides: 81 mg/dL (ref 0.0–149.0)

## 2011-09-07 NOTE — Telephone Encounter (Signed)
[email or web address] for pt 

## 2011-09-07 NOTE — Patient Instructions (Signed)
Today we updated your med list in EPIC...    Be sure to let the nurse know about any refills we need to fax into [facility]...  Today we did your follow up fasting blood work...    Please call the [facility] in a few days for your results...    Dial [id number] & when prompted enter your patient number followed by the # symbol...    Your patient number is:  [id number]#  Keep up the good work w/ your diet & exercise program...  Let me know if we can be of service in any way...  Let's plan a follow up recheck in about 6 months, sooner if needed.Marland KitchenMarland Kitchen

## 2011-09-07 NOTE — Progress Notes (Signed)
Subjective:    Patient ID: [name], female    DOB: 1935/09/07, 76 y.o.   MRN: [id number]  HPI 76 y/o WF here for a follow up visit... she has multiple medical problems as noted below...     Followed by DrCooper for Cards- hx ASD repair 1982 & dilated AoRoot;  HBP & PAF;  then Aortic dissection w/ emergency surg 7/11 by DrOwen w/ CABG x2 and miraculous recovery...   Followed by DrDBrodie for GI- hx dysphagia & esoph strictures dilated, also hx candida esoph... also prob w/ constip part related to rectocele...  ~  September 04, 2010:  She developed sudden left sided back & chest pain 06/04/10 w/ signs of ischemia & taken to cath lab w/ AoDissection found along w/ Lmain disease- 11hr emergency operation by DrOwen w/ repair of aortic dissection & CABG x2 (SVG to LAD, & SVG to obtuse marg branch of Circ) was required to get her off the bypass machine... she made a miraculous recovery- followed closely by DrOwen & DrCooper w/ meds as below... getting stonger w/ home health rehab, etc... some recent incr trouble w/ fluid retention & they have adjusted diuretics... XRays & labs reviewed... I have offered assist in any way needed- she requests refill Rxs to [facility].  ~  September 07, 2011:  1 year ROV & she has had a lot going on >> long prob list & multisystem disease w/ mult specialists involved in her care...    Abn CXR> s/p AscAo dissection repair & bilat apical schwannomas (see CXR & CTA report); O2sat= 91% on RA & advised incr exercise at home...    HBP> on Metoprolol50Bid, Lasix80Bid, Zaroxyln2.5 2d/wk, K20Tid- ;  BP= 114/68 & labs showed K= 3.2; advised incr K20 to 2Bid everyday...    CARDIAC> followed by DrCooper & DrOwens w/ long complic cardiac hx> ASD secundum repair at [facility], CAD, PAF, Ac on Chr Diastolic CHF, & 7/11 Asc ThorAo Dissection w/ severe AI & Lmain lesion==> s/p repair of dissecting aneurysm & CABGx2... She knows to take Amox before dental work.    PAF> on ASA81 +  Coumadin in CC, off Amio, followed by DrCooper for Cards    CHOL> on Crestor5 & her FLP looks good (see below)...    DM> borderline DM on diet alone & labs look good; continue diet + exercise...    Hx esophagitis & stricture> followed by DrDBrodie on Prilosec20Bid; last EGD 3/11ndida (Rx'd); denies heartburn, pain, dysphagia, etc...    IBS w/ constip & Rectocele> also followed by DrGottsegen; on stool softeners but she has trouble w/ evacuation & they are aware...    Left flank discomfort> she also sees Urology (prev DrKimbrough) & has f/u appt pending...    DJD/ LBP/ FM> she is actually c/o "corns" on her feet that make walking difficult she says; she will try OTC pads & if not improved she will call Podiatry...    Neuropathy> followed by Autumn Patty on Neurontin300Bid & Tramadol Prn...    Anxiety/ Depression> on Zoloft50, Tranxene7.5 Prn (ave~1/d she says), & [id number];           Problem List:  OBSTRUCTIVE SLEEP APNEA (ICD-327.23) - sleep study 9/06 by Breck Coons showed RDI=25/hr during REM w/ desat to 77%...  ABN CXR w/ left superior mediastinal opacity ?etiology- no change back to 2006 films= benign... ~  CXR 2/11 showed cardiomeg, ectatic Ao, left superior mediast soft tissue prom w/o change (?ectasia of great vessels or benign mass). ~  CXR 9/11 s/p median sternotomy, dissection repair,  & CABG- cardiomeg, bilat effusions & basilar atx, no ch in left superior lesion... ~  CXR 4/12 showed med sternotomy, cardiomeg, hyerinflation/ scarring, some vasc congestion... ~  CTAngio 5/12 showed stable asc ao dissection repair, rounded soft tissues masses both apicies L>R are prob neural tumors (schwannomas)...  HYPERTENSION (ICD-401.9) - controlled on METOPROLOL 50mg Bid, FUROSEMIDE 80mg /d, & KCL 72mEq/d ...  BP=110/70 and she denies HA, visual changes, CP, palipit, syncope... she is fatigued, SOB/ DOE, & +edema... getting PT/ rehab therapy & improving slowly...  PAROXYSMAL ATRIAL FIBRILLATION  (ICD-427.31) - on COUMADIN followed in the CC, & AMIODARONE 200mg /d... followed by Drcooper for Cards.  CORONARY ARTERY DISEASE (ICD-414.00) - on ASA 81mg /d ACUTE ON CHRONIC DIASTOLIC HEART FAILURE (ICD-428.33) - on Lasix, Zaroxyln, & KCl... 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 long-time pt of DrJoeLeBauer and now DrCooper> ~  Cardiolite 5/06 showed no ischemia & EF=65%...  ~  2DEcho 12/07 showed norm LVF, EF=55%, no regional wall motion abn, & RA/RV= mod dil... ~  Cardiac MRI 3/09 showed mod Ao root enlargement 4.3cm, norm AoV/ Arch/ Desc Ao, mod RV & biatrial enlargement from the prev ASD repair (no resid leak), mod PA enlargement, norm LV w/ EF=59%... ~  [facility] 9/09 w/ MI ruled-out, atyp CP (prob esoph spasm) & Myoview was neg- no scar or ischemia, EF= 77%... ~  2DEcho 9/10 showed sl incr LV wall thikness c/w mild LVH, norm wall motion, EF= 55-60%, gr2 DD, mild AI, mod dil LA/RA, mod-severe TR, mild pulmHTN... ~  Emergency hosp 7/11 w/ Aortic dissection, severe AI, severe TR & cath w/ 40%Lmain lesion- s/p repair & required CABG x2 as well... ~  DrOwen does CTAngiograms every 10mo... ~  [facility] 4/12 by Cards for CHF from severe TR & right heart disease> SEE 2DEcho & Right heart cath data> reviewed... ~  She saw DrCooper 10/12> felt to be reasonably stable, holding NSR, on Coumadin via CC, & f/u 2DEcho ordered & pending... ~  Labs 10/12 showed K=3.2 on K20Tid+4MTh; TCO2=37, BUN=28, Creat=0.8; pt instructed to incr K20 to [facility] everyday...  VENOUS INSUFFICIENCY (ICD-459.81) & EDEMA (ICD-782.3) - she has chr VI changes, superficial VV, and incr edema since surg 7/11... ~  3/09 ABI's done and were WNL...  HYPERCHOLESTEROLEMIA (ICD-272.0) - on CRESTOR 5mg /d; she has blamed Zetia for cough in the past... ~  FLP 7/08 showing TChol 143, TG 173, HDL 32, LDL 76. ~  FLP 4/09 showed  TChol 172, Tg 161, HDL 38, LDL 102 ~  FLP 7/09 showed TChol 167. Tg 148, HDL 40, LDL 98... rec- continue same. ~  FLP 1/10 showed TChol 177, TG 85, HDL 43, LDL 117 ~  FLP 2/11 showed TChol 160, TG 83, HDL 50, LDL 94 ~  FLP 10/12 on Cres5 showed TChol 150, TG 81, HDL 52, LDL 82  DIABETES MELLITUS, BORDERLINE (ICD-790.29) - on diet Rx alone... ~  Hx borderline BS w/ FBS=119 in 7/08 & HgA1c=6.1 on diet alone. ~  labs 4/09 showed BS= 120, HgA1c= 6.0.Marland KitchenMarland Kitchen continue diet rx. ~  labs 7/09 showed BS= 132 ~  labs 1/10 showed BS= 120, A1c= 6.1 ~  labs 2/11 showed BS= 106 ~  Labs 10/12 showed BS= 121, A1c= 6.3.Marland KitchenMarland Kitchen Ok on diet Rx.  Hx of ESOPHAGEAL STRICTURE (ICD-530.3) - last EGD 6/04 by DrDBrodie w/ esophagitis,  hx gastritis... 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 treated w/ PRILOSEC 20mg Bid... ~  [facility] Sep09 w/ atyp CP felt to be esoph spasm... GI f/u DrDBrodie w/ EGD Sep09= candida esoph, nonobstructing esoph stricture dilated. ~  2/11: presents w/ incr dysphagia & f/u GI w/ Ba Esophagram-mild stricture;  EGD 3/11 showed candida esophagitis (Rx'd);  Sonar- no gallstones, WNL.Marland KitchenMarland Kitchen  IRRITABLE BOWEL SYNDROME, HX OF (ICD-V12.79) - colonoscopy was 6/04 by DrDBrodie= neg without polyps etc... f/u 9/09 was negative- no recurrent polyps... f/u planned 9/14... ~  she reports trouble w/ constip and hx recurrent rectocele- initial surg 1987, now sees DrGottsegen for GYN & may need additional surg... ~  She saw DrDBrodie 9/12> sm vol intermit painless rectal bleeding from anal fissures; rectocele, constip, difficulty evacuating; on stool softeners & Nupercainal oint.  UROLOGY> followed by DrKimbrough> Hx microhematuria, renal cyst, duplication & dilatation on the left, left sided pain off & on...  DEGENERATIVE JOINT DISEASE (ICD-715.90)  BACK PAIN, LUMBAR (ICD-724.2)  FIBROMYALGIA (ICD-729.1)  PERIPHERAL NEUROPATHY (ICD-356.9) - sl worse per pt and DrLove  follows... she takes NEURONTIN 300mg Bid and refuses other meds... 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  ?TIA - hosp 11/07 by Autumn Patty w/ ?TIA vs sleep paralysis episode... MRIBrain w/ sm vessel dis and atherosclerotic changes... she still complains of "weak spells, my whole body gets weak in the afternoons"... these episodes last ~70min, resolve spont w/ rest or ?better after eating... she is convinced that the sleep paralysis episodes were caused by the Requip med...  ANXIETY DEPRESSION (ICD-300.4) - she states that "my nerves are shot" w/ stress from son's alcoholism & divorce, plus her husb's illness etc... started ZOLOFT 50mg /d 7/10 & improved...  Health Maintenance: ~  GYN= DrGottsegen w/ Mammograms at [facility] (last 4/10 w/ ultrasound f/u)... Gyn does BMD's and she reports it was normal several yrs ago, not on calcium, MVI, etc... ~  Immunizations:  she reports 2010 Flu vaccine & Pneumonia shot in 2010... cna't recall last Tetanus.   Past Medical History  Diagnosis Date  . OSA (obstructive sleep apnea)   . HTN (hypertension)   . Atrial fibrillation     paroxysmal; on coumadin  . CAD (coronary artery disease)     a. cath 7/11: LM 40%, mild plaque disease in CFX, LAD, and RCA;  b. CABG with S-LAD and S-OM done at time of Aortic dissection repair  . Diastolic heart failure   . Dissection of aorta, thoracic     a. Type A; s/p repair 7/11 with aortic root repair and CABG x 2   . ASD (atrial septal defect)     a. s/p repair 1982 at [facility]  . Right heart failure     a. 2/2 TR and RV dysfxn;  b. echo 4/12: EF 60%, mild LVH, mild AI, mild MR, mod LAE, mod RVE with mod dec. RVSF, mod RAE, mod to severe TR, PASP 58;  c. right heart cath 4/12:  RA mean 8, RV 46/1 with mean 6, PA 45/13 with mean 26, PCWP mean 14, CO 3.68, CI 2.1 (no sig pulmon HTN)  . GERD (gastroesophageal reflux disease)   . IBS  (irritable bowel syndrome)   . Esophageal stricture   . Colonic polyp   . Peripheral neuropathy   . Back pain   . Fibromyalgia   .  Anxiety   . HLD (hyperlipidemia)   . Borderline diabetes   . History of TIAs   . DJD (degenerative joint disease)   . Normocytic anemia   . Thrombocytopenia   . Macular degeneration   . Aneurysm 2011    Disecting aortic aneurysm  . Depression   . Atrial septal defect   . Esophageal dysmotilities     Past Surgical History  Procedure Date  . Asd repair 1982    [facility]  . Rotator cuff repair 2007    right  . Cardioversion     x 3  . Tubal ligation   . Appendectomy   . Vaginal hysterectomy 1987    A/P Repair With Cystocele and rectocele repair  . Tonsillectomy   . Oophorectomy 1994    BSO  . Pelvic laparoscopy 1994    Outpatient Encounter Prescriptions as of 09/07/2011  Medication Sig Dispense Refill  . amoxicillin (AMOXIL) 500 MG capsule Take 500 mg by mouth. Take 4 capsules 1 hour prior dental procedure       . aspirin 81 MG tablet Take 81 mg by mouth daily.        . beta carotene w/minerals (OCUVITE) tablet Take 1 tablet by mouth 2 (two) times daily.        Jennette Banker Sodium 30-100 MG CAPS Take by mouth. [facility] BRAND LAXA-w/ STOOL SOFTNER EVERY EVENING       . Cholecalciferol (VITAMIN D) 1000 UNITS capsule Take 1,000 Units by mouth daily.        . Clorazepate Dipotassium (TRANXENE-T PO) as needed.        . fluticasone (FLONASE) 50 MCG/ACT nasal spray 1-2 sprays in each nostril at bedtime  48 g  3  . furosemide (LASIX) 80 MG tablet Take 80 mg by mouth 2 (two) times daily.        Marland Kitchen gabapentin (NEURONTIN) 300 MG capsule Take 300 mg by mouth 2 (two) times daily.        . metolazone (ZAROXOLYN) 2.5 MG tablet Take One Tablet By Mouth 30 Minutes Prior To Furosemide Dose Every Monday and Thursday morning  90 tablet  3  . metoprolol (LOPRESSOR) 50 MG tablet Take 1 tablet (50 mg total) by mouth 2 (two) times daily.  180 tablet  0  .  omeprazole (PRILOSEC) 20 MG capsule Take 1 capsule (20 mg total) by mouth 2 (two) times daily.  180 capsule  0  . potassium chloride SA (K-DUR,KLOR-CON) 20 MEQ tablet TAKE 1 TAB THREE TIMES  DAILY; EXCEPT ON Monday AND Thursday TAKE AN EXTRA POTASSIUM WITH YOUR METOLAZONE  420 tablet  0  . rosuvastatin (CRESTOR) 10 MG tablet Take 1/2 tab daily      . sertraline (ZOLOFT) 50 MG tablet Take 1 tablet (50 mg total) by mouth daily.  90 tablet  0  . traMADol (ULTRAM) 50 MG tablet TAKE 1 TABLET THREE TIMES DAILY AS NEEDED FOR PAIN  90 tablet  3  . warfarin (COUMADIN) 5 MG tablet Take by mouth as directed.        . zolpidem (AMBIEN) 10 MG tablet Take 10 mg by mouth at bedtime as needed.          Allergies  Allergen Reactions  . Atorvastatin     REACTION: muscle pain  . Codeine     REACTION: itching  . Erythromycin   . Ezetimibe     REACTION: INTOL to Zetia w/ cough  . Meperidine Hcl  REACTION: dizziness  . Morphine   . Ropinirole Hydrochloride     REACTION: INTOL to Requip w/ sleep paralysis  . Simvastatin     REACTION: unable to walk--muscle pain  . Triple Antibiotic     Current Medications, Allergies, Past Medical History, Past Surgical History, Family History, and Social History were reviewed in [employer] record.    Review of Systems         See HPI - all other systems neg except as noted...  The patient complains of decreased hearing, dyspnea on exertion, and peripheral edema.  The patient denies anorexia, fever, weight loss, weight gain, vision loss, hoarseness, chest pain, syncope, prolonged cough, headaches, hemoptysis, abdominal pain, melena, hematochezia, severe indigestion/heartburn, hematuria, incontinence, muscle weakness, suspicious skin lesions, transient blindness, difficulty walking, depression, unusual weight change, abnormal bleeding, enlarged lymph nodes, and angioedema.    Objective:   Physical Exam    WD, Thin, 76 y/o WF in  NAD... GENERAL:  Alert & oriented; pleasant & cooperative... HEENT:  [place]/AT, EOM-wnl, PERRLA, Fundi-benign, EACs-clear, TMs-wnl, NOSE-clear, THROAT-clear & wnl... NECK:  Supple w/ fairROM; no JVD; normal carotid impulses w/o bruits; no thyromegaly or nodules palpated; no lymphadenopathy. CHEST:  Median sterotomy scar, decr BS at bases w/ few bibasilar rales... HEART:  Regular Rhythm;  gr 1/6 sys murmur, without rubs or gallops detected... s/p repair ASD & Ao dissection... ABDOMEN:  Soft & nontender; normal bowel sounds; no organomegaly or masses detected. EXT: without deformities, mild arthritic changes; no varicose veins/ +venous insuffic/ 3+ edema. NEURO:  CN's intact;  peripheral neuropathy without focal neuro deficits on exam... DERM:  No lesions noted; no rash etc...  RADIOLOGY DATA:  Reviewed in the EPIC EMR & discussed w/ the patient...  LABORATORY DATA:  Reviewed in the EPIC EMR & discussed w/ the patient...   Assessment & Plan:   Abn CXR> s/p AscAo dissection repair & bilat apical schwannomas (see CXR & CTA report); O2sat= 91% on RA & advised incr exercise at home...     HBP> on Metoprolol50Bid, Lasix80Bid, Zaroxyln2.5 2d/wk, K20Tid- ;  BP= 114/68 & labs showed K= 3.2; advised incr K20 to 2Bid everyday...     CARDIAC> followed by DrCooper & DrOwens w/ long complic cardiac hx> ASD secundum repair at [facility], CAD, PAF, Ac on Chr Diastolic CHF, & 7/11 Asc ThorAo Dissection w/ severe AI & Lmain lesion==> s/p repair of dissecting aneurysm & CABGx2... She knows to take Amox before dental work.     PAF> on ASA81 + Coumadin in CC, off Amio, holding NSR, followed by DrCooper for Cards     CHOL> on Crestor5 & her FLP looks good (see below)...     DM> borderline DM on diet alone & labs look good; continue diet + exercise...     Hx esophagitis & stricture> followed by DrDBrodie on Prilosec20Bid; last EGD 3/11ndida (Rx'd); denies heartburn, pain, dysphagia, etc...  IBS w/ constip &  Rectocele> also followed by DrGottsegen; on stool softeners but she has trouble w/ evacuation & they are aware...     Left flank discomfort> she also sees Urology (prev DrKimbrough) & has f/u appt pending...     DJD/ LBP/ FM> she is actually c/o "corns" on her feet that make walking difficult she says; she will try OTC pads & if not improved she will call Podiatry...     Neuropathy> followed by Autumn Patty on Neurontin300Bid & Tramadol Prn...     Anxiety/ Depression> on Zoloft50, Tranxene7.5 Prn (ave~1/d  she says), & [id number];

## 2011-09-08 NOTE — Telephone Encounter (Signed)
Unable to reach pt.  Will wait for refill requests from pharmacy to refill meds per SN.

## 2011-09-08 NOTE — Telephone Encounter (Signed)
[id number]

## 2011-09-09 ENCOUNTER — Telehealth: Payer: Self-pay | Admitting: Pulmonary Disease

## 2011-09-09 NOTE — Telephone Encounter (Signed)
Per SN, we will wait for pharmacy to send refill requests before filling.  Called and spoke with pt's husband and informed him of Sn's response.  Husband verbalized understanding and stated he would relay message to pt.

## 2011-09-09 NOTE — Telephone Encounter (Signed)
Called and spoke with pt.  Pt requesting refills sent to [facility] for:  Prilosec, Warfarin, Metoprolol, Potassium, Gabapentin, Sertraline, Ambien and Flonase.  And then would like refills sent to [facility] on [address] for Lasix, Metolazone and Tranxene.

## 2011-09-10 ENCOUNTER — Telehealth: Payer: Self-pay | Admitting: Pulmonary Disease

## 2011-09-10 NOTE — Telephone Encounter (Signed)
Called and spoke with pt and she is aware that lab results not completed at this time.  Pt is aware that i will call her on Monday with her results.

## 2011-09-17 ENCOUNTER — Telehealth: Payer: Self-pay | Admitting: Pulmonary Disease

## 2011-09-17 DIAGNOSIS — I10 Essential (primary) hypertension: Secondary | ICD-10-CM

## 2011-09-17 NOTE — Telephone Encounter (Signed)
[email or web address]  To review lab results with pt---per SN---chol looks great on crestor 10mg   1/2 tab daily, low potassium, recs to take k20   2 po bid everyday,  Borderline anemia and tsh and a1c are both ok.  Waiting for pt to call me back to give her lab results per SN.

## 2011-09-17 NOTE — Telephone Encounter (Signed)
Pt calling and requesting lab results. TP please advise. thanks

## 2011-09-18 ENCOUNTER — Encounter: Payer: Self-pay | Admitting: Pulmonary Disease

## 2011-09-20 ENCOUNTER — Encounter: Payer: Self-pay | Admitting: Thoracic Surgery (Cardiothoracic Vascular Surgery)

## 2011-09-20 MED ORDER — POTASSIUM CHLORIDE CRYS ER 20 MEQ PO TBCR: EXTENDED_RELEASE_TABLET | ORAL | Status: DC

## 2011-09-20 NOTE — Telephone Encounter (Addendum)
Called and spoke with pt about her lab resutls per SN--she is aware to return to the lab in mid December for repeat potassium check---dec 19-- and we will call her with these results.  Pt is aware of lab results per SN and to increase her potassium to 2 tablets bid.  This new rx has been sent [facility] per pts request.  Pt is to call for further questions or concerns.  Pt voiced her understanding.

## 2011-09-22 ENCOUNTER — Other Ambulatory Visit: Payer: Self-pay | Admitting: Thoracic Surgery (Cardiothoracic Vascular Surgery)

## 2011-09-27 ENCOUNTER — Encounter: Payer: Self-pay | Admitting: Thoracic Surgery (Cardiothoracic Vascular Surgery)

## 2011-09-27 ENCOUNTER — Ambulatory Visit (INDEPENDENT_AMBULATORY_CARE_PROVIDER_SITE_OTHER): Payer: Medicare Other | Admitting: Thoracic Surgery (Cardiothoracic Vascular Surgery)

## 2011-09-27 ENCOUNTER — Ambulatory Visit
Admission: RE | Admit: 2011-09-27 | Discharge: 2011-09-27 | Disposition: A | Discharge status: Home / Self Care | Payer: Medicare Other | Source: Ambulatory Visit | Attending: Thoracic Surgery (Cardiothoracic Vascular Surgery) | Admitting: Thoracic Surgery (Cardiothoracic Vascular Surgery)

## 2011-09-27 VITALS — BP 126/77 | HR 72 | Resp 20 | Ht 67.0 in | Wt 152.0 lb

## 2011-09-27 DIAGNOSIS — I7101 Dissection of thoracic aorta: Secondary | ICD-10-CM

## 2011-09-27 DIAGNOSIS — I071 Rheumatic tricuspid insufficiency: Secondary | ICD-10-CM

## 2011-09-27 DIAGNOSIS — I079 Rheumatic tricuspid valve disease, unspecified: Secondary | ICD-10-CM

## 2011-09-27 MED ORDER — IOHEXOL 350 MG/ML SOLN: 100.0000 mL | Freq: Once | INTRAVENOUS | Status: DC | PRN

## 2011-09-27 NOTE — Progress Notes (Signed)
PCP is Michele Mcalpine, MD, MD Referring Provider is Michele Mcalpine, MD  Chief Complaint  Patient presents with  . Follow-up    6 month f/u with Chest/ABD/Pelvis CT, surveilliance  of repair of acute type A aortic dissection, 06/05/2010     HPI: Patient is a 76 year old female who returns for routine followup and surveillance status post emergency repair of acute type A aortic dissection in July 2011. She was last seen here in our office on 03/29/2011. She has remained reasonably stable overall in remarkably active under the circumstances. She was hospitalized for congestive heart failure on one occasion, but recently she has been doing quite well and very stable. She has no signs of pain in her chest or back sounds be related to her previous aortic dissection. She has chronic exertional shortness of breath which is quite stable on her current medical therapy. She is functionally remarkably active under the circumstances. The remainder of her review of systems noncontributory.  Past Medical History  Diagnosis Date  . OSA (obstructive sleep apnea)   . HTN (hypertension)   . Atrial fibrillation     paroxysmal; on coumadin  . CAD (coronary artery disease)     a. cath 7/11: LM 40%, mild plaque disease in CFX, LAD, and RCA;  b. CABG with S-LAD and S-OM done at time of Aortic dissection repair  . Diastolic heart failure   . Dissection of aorta, thoracic     a. Type A; s/p repair 7/11 with aortic root repair and CABG x 2   . ASD (atrial septal defect)     a. s/p repair 1982 at [facility]  . Right heart failure     a. 2/2 TR and RV dysfxn;  b. echo 4/12: EF 60%, mild LVH, mild AI, mild MR, mod LAE, mod RVE with mod dec. RVSF, mod RAE, mod to severe TR, PASP 58;  c. right heart cath 4/12:  RA mean 8, RV 46/1 with mean 6, PA 45/13 with mean 26, PCWP mean 14, CO 3.68, CI 2.1 (no sig pulmon HTN)  . GERD (gastroesophageal reflux disease)   . IBS (irritable bowel syndrome)   . Esophageal stricture   . Colonic  polyp   . Peripheral neuropathy   . Back pain   . Fibromyalgia   . Anxiety   . HLD (hyperlipidemia)   . Borderline diabetes   . History of TIAs   . DJD (degenerative joint disease)   . Normocytic anemia   . Thrombocytopenia   . Macular degeneration   . Aneurysm 2011    Disecting aortic aneurysm  . Depression   . Atrial septal defect   . Esophageal dysmotilities     Past Surgical History  Procedure Date  . Asd repair 1982    [facility]  . Rotator cuff repair 2007    right  . Cardioversion     x 3  . Tubal ligation   . Appendectomy   . Vaginal hysterectomy 1987    A/P Repair With Cystocele and rectocele repair  . Tonsillectomy   . Oophorectomy 1994    BSO  . Pelvic laparoscopy 1994  . Emergency redo median sternotomy for hemiarch repair of acute type a aortic  dissection 06/05/2010    Family History  Problem Relation Age of Onset  . Pancreatic cancer Sister   . Osteoporosis Sister   . Lung cancer Brother     x 2  . Heart attack Brother   . Melanoma Sister   .  Diabetes Mother   . Hypertension Mother   . Colon cancer Sister   . ALS Mother   . Heart disease Brother     x 2    Social History History  Substance Use Topics  . Smoking status: Never Smoker   . Smokeless tobacco: Never Used  . Alcohol Use: No    Current Outpatient Prescriptions  Medication Sig Dispense Refill  . amoxicillin (AMOXIL) 500 MG capsule Take 500 mg by mouth. Take 4 capsules 1 hour prior dental procedure       . aspirin 81 MG tablet Take 81 mg by mouth daily.        . beta carotene w/minerals (OCUVITE) tablet Take 1 tablet by mouth 2 (two) times daily.        Jennette Banker Sodium 30-100 MG CAPS Take by mouth. [facility] BRAND LAXA-w/ STOOL SOFTNER EVERY EVENING       . Cholecalciferol (VITAMIN D) 1000 UNITS capsule Take 1,000 Units by mouth daily.        . Clorazepate Dipotassium (TRANXENE-T PO) as needed.        . fluticasone (FLONASE) 50 MCG/ACT nasal spray 1-2 sprays in each  nostril at bedtime  48 g  3  . furosemide (LASIX) 80 MG tablet Take 80 mg by mouth 2 (two) times daily.        Marland Kitchen gabapentin (NEURONTIN) 300 MG capsule Take 300 mg by mouth 2 (two) times daily.        Marland Kitchen glucosamine-chondroitin 500-400 MG tablet Take 1 tablet by mouth 1 day or 1 dose.        . metolazone (ZAROXOLYN) 2.5 MG tablet Take One Tablet By Mouth 30 Minutes Prior To Furosemide Dose Every Monday and Thursday morning  90 tablet  3  . metoprolol (LOPRESSOR) 50 MG tablet Take 1 tablet (50 mg total) by mouth 2 (two) times daily.  180 tablet  0  . omeprazole (PRILOSEC) 20 MG capsule Take 1 capsule (20 mg total) by mouth 2 (two) times daily.  180 capsule  0  . potassium chloride SA (K-DUR,KLOR-CON) 20 MEQ tablet Take 2 tablets by mouth two times daily  360 tablet  3  . rosuvastatin (CRESTOR) 10 MG tablet Take 1/2 tab daily      . sertraline (ZOLOFT) 50 MG tablet Take 1 tablet (50 mg total) by mouth daily.  90 tablet  0  . traMADol (ULTRAM) 50 MG tablet TAKE 1 TABLET THREE TIMES DAILY AS NEEDED FOR PAIN  90 tablet  3  . warfarin (COUMADIN) 5 MG tablet Take by mouth as directed.        . zolpidem (AMBIEN) 10 MG tablet Take 10 mg by mouth at bedtime as needed.         No current facility-administered medications for this visit.   Facility-Administered Medications Ordered in Other Visits  Medication Dose Route Frequency Provider Last Rate Last Dose  . iohexol (OMNIPAQUE) 350 MG/ML injection 100 mL  100 mL Intravenous Once PRN Medication Radiologist        Allergies  Allergen Reactions  . Atorvastatin     REACTION: muscle pain  . Codeine     REACTION: itching  . Erythromycin   . Ezetimibe     REACTION: INTOL to Zetia w/ cough  . Meperidine Hcl     REACTION: dizziness  . Morphine   . Ropinirole Hydrochloride     REACTION: INTOL to Requip w/ sleep paralysis  . Simvastatin  REACTION: unable to walk--muscle pain  . Triple Antibiotic     Review of Systems: Otherwise  unremarkable  BP 126/77  Pulse 72  Resp 20  Ht 5\' 7"  (1.702 m)  Wt 152 lb (68.947 kg)  BMI 23.81 kg/m2  SpO2 98% Physical Exam: Well-appearing elderly female with stable blood pressure and regular rate and rhythm. Breath sounds are clear to auscultation and symmetrical bilaterally. Her median sternotomy scar has healed completely and the sternum is stable and nontender. Cardiovascular exam is notable for regular rhythm with occasional ectopic beats. No murmurs are noted. The abdomen is soft and nontender. Extremities are warm and well-perfused. There is mild bilateral lower extremity edema. Distal pulses are not palpable at the ankle.  Diagnostic Tests: CT angiogram of the chest abdomen and pelvis performed today is reviewed. This demonstrates no significant changes in comparison with the last exam. There is stable radiographic appearance of the remaining dissection involving the innominate artery.  The remaining transverse aortic arch and descending thoracic aorta all appear normal with no associated residual aortic dissection. No new abnormalities are noted.  Impression: The patient remains clinically stable following repair of acute aortic dissection in July 2011. Followup CT angiogram remained stable with a relatively small area of residual dissection involving the innominate artery and no other pathologic findings.  Plan: We will have the patient return in 6 months for followup CT angiogram. If it continues to remain stable at that time we probably can start decreasing the frequency of followup exams. The patient has been reminded to avoid any sort of heavy lifting or straining, although it does not sound as though she is likely to be pushing herself physically and manner which could potentially cause problems. She has also been reminded how important remains for her to keep her blood pressure under good control. She is being followed carefully by Dr. Excell Seltzer who obviously is doing a good job  with her long-term medical therapy. All of her questions been addressed.

## 2011-09-29 ENCOUNTER — Ambulatory Visit (INDEPENDENT_AMBULATORY_CARE_PROVIDER_SITE_OTHER): Payer: Medicare Other | Admitting: *Deleted

## 2011-09-29 DIAGNOSIS — Z8679 Personal history of other diseases of the circulatory system: Secondary | ICD-10-CM

## 2011-09-29 DIAGNOSIS — I4891 Unspecified atrial fibrillation: Secondary | ICD-10-CM

## 2011-09-29 LAB — POCT INR: INR: 2.5

## 2011-10-01 ENCOUNTER — Other Ambulatory Visit: Payer: Self-pay | Admitting: Pulmonary Disease

## 2011-10-04 ENCOUNTER — Telehealth: Payer: Self-pay | Admitting: Pulmonary Disease

## 2011-10-04 MED ORDER — OMEPRAZOLE 20 MG PO CPDR: 20.0000 mg | DELAYED_RELEASE_CAPSULE | Freq: Two times a day (BID) | ORAL | Status: DC

## 2011-10-04 MED ORDER — GABAPENTIN 300 MG PO CAPS: 300.0000 mg | ORAL_CAPSULE | Freq: Two times a day (BID) | ORAL | Status: DC

## 2011-10-04 MED ORDER — ZOLPIDEM TARTRATE 10 MG PO TABS: 10.0000 mg | ORAL_TABLET | Freq: Every evening | ORAL | Status: DC | PRN

## 2011-10-04 NOTE — Telephone Encounter (Signed)
I spoke with pt and husband and was advised she needed refill sent to [facility] on gabapentin, prilosec, and ambien. I advised will send rx's in for pt. rx's have been called into [facility] for pt and nothing further was needed

## 2011-10-08 ENCOUNTER — Telehealth: Payer: Self-pay | Admitting: Pulmonary Disease

## 2011-10-08 NOTE — Telephone Encounter (Signed)
Will check with SN and print out rx for [facility] and will have SN sign these on Monday and fax to the pharmacy.

## 2011-10-08 NOTE — Telephone Encounter (Signed)
Per phone note from 10/04/11, Mindy called these rxs into [facility].  [facility]C [facility] but their office is closed for the day.  Will call them back on Monday.  LMOM for pt to inform her of this

## 2011-10-11 MED ORDER — ZOLPIDEM TARTRATE 10 MG PO TABS: 10.0000 mg | ORAL_TABLET | Freq: Every evening | ORAL | Status: DC | PRN

## 2011-10-11 MED ORDER — GABAPENTIN 300 MG PO CAPS: 300.0000 mg | ORAL_CAPSULE | Freq: Two times a day (BID) | ORAL | Status: DC

## 2011-10-11 MED ORDER — OMEPRAZOLE 20 MG PO CPDR: 20.0000 mg | DELAYED_RELEASE_CAPSULE | Freq: Two times a day (BID) | ORAL | Status: DC

## 2011-10-11 NOTE — Telephone Encounter (Signed)
All 3 rx have been printed out and placed on SN cart to be signed and we will fax these to [facility] for refills.

## 2011-10-14 NOTE — Telephone Encounter (Signed)
Can you please close encounter 

## 2011-10-21 ENCOUNTER — Other Ambulatory Visit (INDEPENDENT_AMBULATORY_CARE_PROVIDER_SITE_OTHER): Payer: Medicare Other

## 2011-10-21 ENCOUNTER — Telehealth: Payer: Self-pay | Admitting: Pulmonary Disease

## 2011-10-21 DIAGNOSIS — I10 Essential (primary) hypertension: Secondary | ICD-10-CM

## 2011-10-21 LAB — BASIC METABOLIC PANEL
BUN: 30 mg/dL — ABNORMAL HIGH (ref 6–23)
CO2: 32 mEq/L (ref 19–32)
Chloride: 99 mEq/L (ref 96–112)
Glucose, Bld: 153 mg/dL — ABNORMAL HIGH (ref 70–99)
Potassium: 3.7 mEq/L (ref 3.5–5.1)
Sodium: 140 mEq/L (ref 135–145)

## 2011-10-21 NOTE — Telephone Encounter (Signed)
Pt spouse is aware samples of crestor has been left upfront for p/u. Nothing further was needed

## 2011-10-25 ENCOUNTER — Telehealth: Payer: Self-pay | Admitting: *Deleted

## 2011-10-25 NOTE — Telephone Encounter (Signed)
Called and spoke with pt about her lab results per SN---see result note---  Pt is aware to keep potassium at 2 tab bid daily.  Pt to call back for any questions or concerns.

## 2011-10-25 NOTE — Telephone Encounter (Signed)
[email or web address] for pt to discuss her lab results.

## 2011-10-25 NOTE — Telephone Encounter (Signed)
Pt returned call. [name]  

## 2011-11-02 ENCOUNTER — Other Ambulatory Visit: Payer: Self-pay | Admitting: Pulmonary Disease

## 2011-11-02 ENCOUNTER — Other Ambulatory Visit: Payer: Self-pay | Admitting: Cardiovascular Disease

## 2011-11-11 ENCOUNTER — Encounter: Payer: Medicare Other | Admitting: *Deleted

## 2011-11-15 ENCOUNTER — Ambulatory Visit (INDEPENDENT_AMBULATORY_CARE_PROVIDER_SITE_OTHER): Payer: Medicare Other | Admitting: *Deleted

## 2011-11-15 DIAGNOSIS — I4891 Unspecified atrial fibrillation: Secondary | ICD-10-CM

## 2011-11-15 DIAGNOSIS — Z8679 Personal history of other diseases of the circulatory system: Secondary | ICD-10-CM

## 2011-11-16 HISTORY — PX: NASAL HEMORRHAGE CONTROL: SHX287

## 2011-12-01 ENCOUNTER — Ambulatory Visit (INDEPENDENT_AMBULATORY_CARE_PROVIDER_SITE_OTHER): Payer: Medicare Other | Admitting: Cardiovascular Disease

## 2011-12-01 ENCOUNTER — Encounter: Payer: Self-pay | Admitting: Cardiovascular Disease

## 2011-12-01 DIAGNOSIS — I4891 Unspecified atrial fibrillation: Secondary | ICD-10-CM

## 2011-12-01 DIAGNOSIS — I509 Heart failure, unspecified: Secondary | ICD-10-CM

## 2011-12-01 DIAGNOSIS — I729 Aneurysm of unspecified site: Secondary | ICD-10-CM

## 2011-12-01 DIAGNOSIS — I50810 Right heart failure, unspecified: Secondary | ICD-10-CM

## 2011-12-01 DIAGNOSIS — I071 Rheumatic tricuspid insufficiency: Secondary | ICD-10-CM

## 2011-12-01 DIAGNOSIS — I079 Rheumatic tricuspid valve disease, unspecified: Secondary | ICD-10-CM

## 2011-12-01 NOTE — Progress Notes (Signed)
HPI:  [name] presents for followup evaluation. She is a 77 year old woman with remote surgical ASD repair and more recent type A. Aortic dissection in 2011 requiring emergency surgery and 2 vessel bypass. She also is followed for paroxysmal atrial fibrillation and right heart failure with severe tricuspid regurgitation.  Overall Ivianna feels well. She continues to walk about 1 mile several days per week. Sunday she is able to do this without a rest and other day she needs to stop about halfway through her walk to rest for a few minutes. She does have mild exertional dyspnea. Her legs feel weak. She denies chest pain or pressure. She denies orthopnea or PND.  Outpatient Encounter Prescriptions as of 12/01/2011  Medication Sig Dispense Refill  . amoxicillin (AMOXIL) 500 MG capsule Take 500 mg by mouth. Take 4 capsules 1 hour prior dental procedure       . aspirin 81 MG tablet Take 81 mg by mouth daily.        . beta carotene w/minerals (OCUVITE) tablet Take 1 tablet by mouth 2 (two) times daily.        Jennette Banker Sodium 30-100 MG CAPS Take by mouth. [facility] BRAND LAXA-w/ STOOL SOFTNER EVERY EVENING       . Cholecalciferol (VITAMIN D) 1000 UNITS capsule Take 1,000 Units by mouth daily.        . clorazepate (TRANXENE) 7.5 MG tablet TAKE ONE TABLET BY MOUTH THREE TIMES DAILY AS NEEDED FOR NERVES  90 tablet  5  . fluticasone (FLONASE) 50 MCG/ACT nasal spray 1-2 sprays in each nostril at bedtime  48 g  3  . furosemide (LASIX) 80 MG tablet TAKE ONE TABLET BY MOUTH TWICE DAILY  180 tablet  2  . gabapentin (NEURONTIN) 300 MG capsule Take 1 capsule (300 mg total) by mouth 2 (two) times daily.  180 capsule  3  . glucosamine-chondroitin 500-400 MG tablet Take 1 tablet by mouth 1 day or 1 dose.        . metolazone (ZAROXOLYN) 2.5 MG tablet Take One Tablet By Mouth 30 Minutes Prior To Furosemide Dose Every Monday and Thursday morning  90 tablet  3  . metoprolol (LOPRESSOR) 50 MG tablet Take 1  tablet (50 mg total) by mouth 2 (two) times daily.  180 tablet  0  . omeprazole (PRILOSEC) 20 MG capsule Take 1 capsule (20 mg total) by mouth 2 (two) times daily.  180 capsule  3  . potassium chloride SA (K-DUR,KLOR-CON) 20 MEQ tablet Take 2 tablets by mouth two times daily  360 tablet  3  . potassium chloride SA (K-DUR,KLOR-CON) 20 MEQ tablet TAKE 1 TABLET THREE TIMES DAILY EXCEPT ON MONDAY AND THURSDAY TAKE AN EXTRA POTASSIUM WITH YOUR METOLAZONE  299 tablet  3  . rosuvastatin (CRESTOR) 10 MG tablet Take 1/2 tab daily      . sertraline (ZOLOFT) 50 MG tablet Take 1 tablet (50 mg total) by mouth daily.  90 tablet  0  . traMADol (ULTRAM) 50 MG tablet TAKE 1 TABLET THREE TIMES DAILY AS NEEDED FOR PAIN  90 tablet  3  . warfarin (COUMADIN) 5 MG tablet TAKE AS DIRECTED  90 tablet  3  . zolpidem (AMBIEN) 10 MG tablet Take 1 tablet (10 mg total) by mouth at bedtime as needed.  90 tablet  3  . DISCONTD: Clorazepate Dipotassium (TRANXENE-T PO) as needed.          Allergies  Allergen Reactions  . Atorvastatin  REACTION: muscle pain  . Codeine     REACTION: itching  . Erythromycin   . Ezetimibe     REACTION: INTOL to Zetia w/ cough  . Meperidine Hcl     REACTION: dizziness  . Morphine   . Ropinirole Hydrochloride     REACTION: INTOL to Requip w/ sleep paralysis  . Simvastatin     REACTION: unable to walk--muscle pain  . Triple Antibiotic     Past Medical History  Diagnosis Date  . OSA (obstructive sleep apnea)   . HTN (hypertension)   . Atrial fibrillation     paroxysmal; on coumadin  . CAD (coronary artery disease)     a. cath 7/11: LM 40%, mild plaque disease in CFX, LAD, and RCA;  b. CABG with S-LAD and S-OM done at time of Aortic dissection repair  . Diastolic heart failure   . Dissection of aorta, thoracic     a. Type A; s/p repair 7/11 with aortic root repair and CABG x 2   . ASD (atrial septal defect)     a. s/p repair 1982 at [facility]  . Right heart failure     a. 2/2 TR and  RV dysfxn;  b. echo 4/12: EF 60%, mild LVH, mild AI, mild MR, mod LAE, mod RVE with mod dec. RVSF, mod RAE, mod to severe TR, PASP 58;  c. right heart cath 4/12:  RA mean 8, RV 46/1 with mean 6, PA 45/13 with mean 26, PCWP mean 14, CO 3.68, CI 2.1 (no sig pulmon HTN)  . GERD (gastroesophageal reflux disease)   . IBS (irritable bowel syndrome)   . Esophageal stricture   . Colonic polyp   . Peripheral neuropathy   . Back pain   . Fibromyalgia   . Anxiety   . HLD (hyperlipidemia)   . Borderline diabetes   . History of TIAs   . DJD (degenerative joint disease)   . Normocytic anemia   . Thrombocytopenia   . Macular degeneration   . Aneurysm 2011    Disecting aortic aneurysm  . Depression   . Atrial septal defect   . Esophageal dysmotilities     ROS: Negative except as per HPI  BP 112/68  Pulse 63  Ht 5\' 7"  (1.702 m)  Wt 72.576 kg (160 lb)  BMI 25.06 kg/m2  PHYSICAL EXAM: Pt is alert and oriented, NAD HEENT: normal Neck: JVP - There are visible V waves in her neck veins, carotids 2+= without bruits Lungs: CTA bilaterally CV: RRR without murmur or gallop Abd: soft, NT, Positive BS, no hepatomegaly but the liver is pulsatile Ext: no C/C/E, distal pulses intact and equal Skin: warm/dry no rash  EKG:  Sinus rhythm with PACs, LVH with repolarization abnormality.  ASSESSMENT AND PLAN:

## 2011-12-01 NOTE — Patient Instructions (Signed)
Your physician has recommended you make the following change in your medication: CHANGE Crestor to every other day  Your physician wants you to follow-up in: 6 MONTHS.  You will receive a reminder letter in the mail two months in advance. If you don't receive a letter, please call our office to schedule the follow-up appointment.  Your physician has requested that you have an echocardiogram in October 2013. Echocardiography is a painless test that uses sound waves to create images of your heart. It provides your doctor with information about the size and shape of your heart and how well your heart's chambers and valves are working. This procedure takes approximately one hour. There are no restrictions for this procedure.

## 2011-12-02 ENCOUNTER — Encounter: Payer: Self-pay | Admitting: Cardiovascular Disease

## 2011-12-02 NOTE — Assessment & Plan Note (Signed)
The patient has tricuspid regurgitation and pulmonary hypertension. Will continue her same therapy and followup with an echocardiogram in October.

## 2011-12-02 NOTE — Assessment & Plan Note (Signed)
The patient is in sinus rhythm. She has marked atrial dilatation. She will continue on long-term anticoagulation with warfarin.

## 2011-12-02 NOTE — Assessment & Plan Note (Signed)
She continues on a CT surveillance program with Dr. Cornelius Moras. Her blood pressure is very well controlled and she will continue on her current medical program.

## 2011-12-06 ENCOUNTER — Telehealth: Payer: Self-pay | Admitting: Cardiovascular Disease

## 2011-12-06 NOTE — Telephone Encounter (Signed)
Pt changed drug companies and needs all cardiac [facility]s [facility] [address] does not want to get refill but wants them on file so she can call the pharmacy when she needs the refill

## 2011-12-07 ENCOUNTER — Telehealth: Payer: Self-pay | Admitting: Cardiovascular Disease

## 2011-12-07 DIAGNOSIS — I071 Rheumatic tricuspid insufficiency: Secondary | ICD-10-CM

## 2011-12-07 DIAGNOSIS — I4891 Unspecified atrial fibrillation: Secondary | ICD-10-CM

## 2011-12-07 MED ORDER — FUROSEMIDE 80 MG PO TABS: 80.0000 mg | ORAL_TABLET | Freq: Two times a day (BID) | ORAL | Status: DC

## 2011-12-07 MED ORDER — POTASSIUM CHLORIDE CRYS ER 20 MEQ PO TBCR: EXTENDED_RELEASE_TABLET | ORAL | Status: DC

## 2011-12-07 MED ORDER — METOPROLOL TARTRATE 50 MG PO TABS: 50.0000 mg | ORAL_TABLET | Freq: Two times a day (BID) | ORAL | Status: DC

## 2011-12-07 MED ORDER — ROSUVASTATIN CALCIUM 10 MG PO TABS: ORAL_TABLET | ORAL | Status: DC

## 2011-12-07 NOTE — Telephone Encounter (Signed)
[facility] [address] [phone]

## 2011-12-07 NOTE — Telephone Encounter (Signed)
Pharmacy changed to [facility] [address]  Called pharmacy and put all cardiac meds on order, to hold, until refill needed.  Called patient to inform.  Left message that request had been taken care of.  Judithe Modest, CMA.

## 2011-12-10 ENCOUNTER — Telehealth: Payer: Self-pay | Admitting: Cardiovascular Disease

## 2011-12-10 ENCOUNTER — Telehealth: Payer: Self-pay | Admitting: Pulmonary Disease

## 2011-12-10 DIAGNOSIS — R609 Edema, unspecified: Secondary | ICD-10-CM

## 2011-12-10 MED ORDER — METOLAZONE 2.5 MG PO TABS: ORAL_TABLET | ORAL | Status: DC

## 2011-12-10 NOTE — Telephone Encounter (Signed)
I spoke with the pt and she [facility]  The pt also requested a refill for Tranxene and Tramadol.  I advised her that these were previously filled by Dr Kriste Basque and that she should contact his office for further refills.  Pt agreed with plan.

## 2011-12-10 NOTE — Telephone Encounter (Signed)
New Msg: pt calling wanting to speak to nurse regarding her medications. Please return pt call to discuss further.

## 2011-12-10 NOTE — Telephone Encounter (Signed)
[email or web address] for pt 

## 2011-12-13 MED ORDER — TRAMADOL HCL 50 MG PO TABS: ORAL_TABLET | ORAL | Status: DC

## 2011-12-13 NOTE — Telephone Encounter (Signed)
I spoke with the pt and she states her scripts need to be sent to [facility] [address]. She needs a refill on tramadol at this time. Refill sent. Pt aware. Carron Curie, CMA

## 2011-12-15 ENCOUNTER — Telehealth: Payer: Self-pay | Admitting: Pulmonary Disease

## 2011-12-15 MED ORDER — CLORAZEPATE DIPOTASSIUM 7.5 MG PO TABS: ORAL_TABLET | ORAL | Status: DC

## 2011-12-15 NOTE — Telephone Encounter (Signed)
Rx was called to pharmacy under SN name not CY as I accidentally signed the RX in EPIC. Pt is aware to keep appt with SN in April for more refills.

## 2011-12-16 ENCOUNTER — Ambulatory Visit (INDEPENDENT_AMBULATORY_CARE_PROVIDER_SITE_OTHER): Payer: Medicare Other | Admitting: *Deleted

## 2011-12-16 DIAGNOSIS — I4891 Unspecified atrial fibrillation: Secondary | ICD-10-CM

## 2011-12-16 DIAGNOSIS — Z8679 Personal history of other diseases of the circulatory system: Secondary | ICD-10-CM

## 2011-12-20 ENCOUNTER — Encounter: Payer: Medicare Other | Admitting: *Deleted

## 2011-12-27 ENCOUNTER — Emergency Department (HOSPITAL_COMMUNITY): Payer: Medicare Other | Admitting: Anesthesiology

## 2011-12-27 ENCOUNTER — Encounter (HOSPITAL_COMMUNITY): Payer: Self-pay | Admitting: Anesthesiology

## 2011-12-27 ENCOUNTER — Encounter (HOSPITAL_COMMUNITY)
Admission: EM | Disposition: A | Discharge status: Home / Self Care | Payer: Self-pay | Source: Home / Self Care | Attending: Internal Medicine

## 2011-12-27 ENCOUNTER — Inpatient Hospital Stay (HOSPITAL_COMMUNITY)
Admission: EM | Admit: 2011-12-27 | Discharge: 2011-12-31 | DRG: 133 | Disposition: A | Discharge status: Home / Self Care | Payer: Medicare Other | Attending: Internal Medicine | Admitting: Internal Medicine

## 2011-12-27 ENCOUNTER — Encounter (HOSPITAL_COMMUNITY): Payer: Self-pay | Admitting: *Deleted

## 2011-12-27 DIAGNOSIS — I4891 Unspecified atrial fibrillation: Secondary | ICD-10-CM | POA: Diagnosis present

## 2011-12-27 DIAGNOSIS — I729 Aneurysm of unspecified site: Secondary | ICD-10-CM | POA: Diagnosis present

## 2011-12-27 DIAGNOSIS — I5032 Chronic diastolic (congestive) heart failure: Secondary | ICD-10-CM | POA: Diagnosis present

## 2011-12-27 DIAGNOSIS — K219 Gastro-esophageal reflux disease without esophagitis: Secondary | ICD-10-CM | POA: Diagnosis present

## 2011-12-27 DIAGNOSIS — D62 Acute posthemorrhagic anemia: Secondary | ICD-10-CM | POA: Diagnosis not present

## 2011-12-27 DIAGNOSIS — F341 Dysthymic disorder: Secondary | ICD-10-CM | POA: Diagnosis present

## 2011-12-27 DIAGNOSIS — E876 Hypokalemia: Secondary | ICD-10-CM | POA: Diagnosis not present

## 2011-12-27 DIAGNOSIS — G609 Hereditary and idiopathic neuropathy, unspecified: Secondary | ICD-10-CM | POA: Diagnosis present

## 2011-12-27 DIAGNOSIS — Z951 Presence of aortocoronary bypass graft: Secondary | ICD-10-CM

## 2011-12-27 DIAGNOSIS — E7800 Pure hypercholesterolemia, unspecified: Secondary | ICD-10-CM | POA: Diagnosis present

## 2011-12-27 DIAGNOSIS — I480 Paroxysmal atrial fibrillation: Secondary | ICD-10-CM | POA: Diagnosis present

## 2011-12-27 DIAGNOSIS — I2510 Atherosclerotic heart disease of native coronary artery without angina pectoris: Secondary | ICD-10-CM | POA: Diagnosis present

## 2011-12-27 DIAGNOSIS — I509 Heart failure, unspecified: Secondary | ICD-10-CM | POA: Diagnosis present

## 2011-12-27 DIAGNOSIS — G4733 Obstructive sleep apnea (adult) (pediatric): Secondary | ICD-10-CM | POA: Diagnosis present

## 2011-12-27 DIAGNOSIS — R040 Epistaxis: Principal | ICD-10-CM | POA: Diagnosis present

## 2011-12-27 DIAGNOSIS — I10 Essential (primary) hypertension: Secondary | ICD-10-CM | POA: Diagnosis present

## 2011-12-27 DIAGNOSIS — R609 Edema, unspecified: Secondary | ICD-10-CM | POA: Diagnosis present

## 2011-12-27 DIAGNOSIS — Z8673 Personal history of transient ischemic attack (TIA), and cerebral infarction without residual deficits: Secondary | ICD-10-CM

## 2011-12-27 HISTORY — PX: NASAL HEMORRHAGE CONTROL: SHX287

## 2011-12-27 LAB — BASIC METABOLIC PANEL
BUN: 32 mg/dL — ABNORMAL HIGH (ref 6–23)
CO2: 27 mEq/L (ref 19–32)
GFR calc Af Amer: >90 mL/min (ref >90)
Glucose, Bld: 194 mg/dL — ABNORMAL HIGH (ref 70–99)
Sodium: 139 mEq/L (ref 135–145)

## 2011-12-27 LAB — PROTIME-INR: Prothrombin Time: 25.3 seconds — ABNORMAL HIGH (ref 11.6–15.2)

## 2011-12-27 LAB — DIFFERENTIAL
Eosinophils Absolute: 0.1 10*3/uL (ref 0.0–0.7)
Eosinophils Relative: 1 % (ref 0–5)
Lymphocytes Relative: 24 % (ref 12–46)
Lymphs Abs: 1.2 10*3/uL (ref 0.7–4.0)
Monocytes Relative: 9 % (ref 3–12)

## 2011-12-27 LAB — CBC
HCT: 37 % (ref 36.0–46.0)
Hemoglobin: 11.7 g/dL — ABNORMAL LOW (ref 12.0–15.0)
MCH: 30.1 pg (ref 26.0–34.0)
MCV: 95.1 fL (ref 78.0–100.0)
RBC: 3.89 MIL/uL (ref 3.87–5.11)
WBC: 5.2 10*3/uL (ref 4.0–10.5)

## 2011-12-27 SURGERY — CONTROL OF EPISTAXIS
Anesthesia: General | Site: Nose | Wound class: Clean Contaminated

## 2011-12-27 MED ORDER — VITAMIN K1 10 MG/ML IJ SOLN
10.0000 mg | INTRAVENOUS | Status: AC
  Administered 2011-12-27: 10 mg via INTRAVENOUS
  Filled 2011-12-27: qty 1

## 2011-12-27 MED ORDER — OXYMETAZOLINE HCL 0.05 % NA SOLN
NASAL | Status: DC | PRN
  Administered 2011-12-27: 1 via NASAL

## 2011-12-27 MED ORDER — FENTANYL CITRATE 0.05 MG/ML IJ SOLN
INTRAMUSCULAR | Status: DC | PRN
  Administered 2011-12-27 (×2): 50 ug via INTRAVENOUS

## 2011-12-27 MED ORDER — LACTATED RINGERS IV SOLN
INTRAVENOUS | Status: DC | PRN
  Administered 2011-12-27: 23:00:00 via INTRAVENOUS

## 2011-12-27 MED ORDER — SUCCINYLCHOLINE CHLORIDE 20 MG/ML IJ SOLN
INTRAMUSCULAR | Status: DC | PRN
  Administered 2011-12-27: 80 mg via INTRAVENOUS

## 2011-12-27 MED ORDER — DIAZEPAM 5 MG/ML IJ SOLN
INTRAMUSCULAR | Status: AC
  Administered 2011-12-27: 5 mg
  Filled 2011-12-27: qty 2

## 2011-12-27 MED ORDER — DIAZEPAM 5 MG/ML IJ SOLN: 5.0000 mg | Freq: Once | INTRAMUSCULAR | Status: AC

## 2011-12-27 MED ORDER — LIDOCAINE-EPINEPHRINE 1 %-1:100000 IJ SOLN
INTRAMUSCULAR | Status: DC | PRN
  Administered 2011-12-27: 10 mL

## 2011-12-27 MED ORDER — PHENYLEPHRINE HCL 10 MG/ML IJ SOLN
INTRAMUSCULAR | Status: DC | PRN
  Administered 2011-12-27 – 2011-12-28 (×2): 80 ug via INTRAVENOUS

## 2011-12-27 MED ORDER — ROCURONIUM BROMIDE 100 MG/10ML IV SOLN
INTRAVENOUS | Status: DC | PRN
  Administered 2011-12-27: 5 mg via INTRAVENOUS

## 2011-12-27 MED ORDER — PROPOFOL 10 MG/ML IV EMUL
INTRAVENOUS | Status: DC | PRN
  Administered 2011-12-27: 130 mg via INTRAVENOUS

## 2011-12-27 MED ORDER — EPHEDRINE SULFATE 50 MG/ML IJ SOLN
INTRAMUSCULAR | Status: DC | PRN
  Administered 2011-12-27 (×5): 10 mg via INTRAVENOUS
  Administered 2011-12-27: 20 mg via INTRAVENOUS
  Administered 2011-12-28 (×2): 5 mg via INTRAVENOUS

## 2011-12-27 MED ORDER — LIDOCAINE HCL (CARDIAC) 20 MG/ML IV SOLN
INTRAVENOUS | Status: DC | PRN
  Administered 2011-12-27: 60 mg via INTRAVENOUS

## 2011-12-27 SURGICAL SUPPLY — 28 items
CANISTER SUCTION 2500CC (MISCELLANEOUS) ×2 IMPLANT
CLOTH BEACON ORANGE TIMEOUT ST (SAFETY) ×2 IMPLANT
COAGULATOR SUCT SWTCH 10FR 6 (ELECTROSURGICAL) ×3 IMPLANT
COVER MAYO STAND STRL (DRAPES) ×2 IMPLANT
DRESSING MEROCEL 8CM (GAUZE/BANDAGES/DRESSINGS) IMPLANT
DRSG MEROCEL 8CM (GAUZE/BANDAGES/DRESSINGS) ×6
GAUZE SPONGE 2X2 8PLY STRL LF (GAUZE/BANDAGES/DRESSINGS) ×1 IMPLANT
GAUZE SPONGE 4X4 16PLY XRAY LF (GAUZE/BANDAGES/DRESSINGS) ×2 IMPLANT
GAUZE VASELINE FOILPK 1/2 X 72 (GAUZE/BANDAGES/DRESSINGS) IMPLANT
GLOVE BIOGEL M 7.0 STRL (GLOVE) ×2 IMPLANT
GOWN STRL NON-REIN LRG LVL3 (GOWN DISPOSABLE) ×4 IMPLANT
HEMOSTAT SURGICEL .5X2 ABSORB (HEMOSTASIS) ×2 IMPLANT
KIT BASIN OR (CUSTOM PROCEDURE TRAY) ×2 IMPLANT
KIT ROOM TURNOVER OR (KITS) ×2 IMPLANT
NDL HYPO 25GX1X1/2 BEV (NEEDLE) ×1 IMPLANT
NDL SPNL 20GX3.5 QUINCKE YW (NEEDLE) IMPLANT
NEEDLE HYPO 25GX1X1/2 BEV (NEEDLE) ×2 IMPLANT
NEEDLE SPNL 20GX3.5 QUINCKE YW (NEEDLE) ×2 IMPLANT
NS IRRIG 1000ML POUR BTL (IV SOLUTION) ×2 IMPLANT
PAD ARMBOARD 7.5X6 YLW CONV (MISCELLANEOUS) ×4 IMPLANT
SOLUTION ANTI FOG 6CC (MISCELLANEOUS) ×1 IMPLANT
SPLINT NASAL THERMO PLAST (MISCELLANEOUS) ×2 IMPLANT
SPONGE GAUZE 2X2 STER 10/PKG (GAUZE/BANDAGES/DRESSINGS) ×1
SPONGE NEURO XRAY DETECT 1X3 (DISPOSABLE) ×2 IMPLANT
SYR CONTROL 10ML LL (SYRINGE) ×2 IMPLANT
TOWEL OR 17X24 6PK STRL BLUE (TOWEL DISPOSABLE) ×4 IMPLANT
TUBE CONNECTING 12X1/4 (SUCTIONS) ×2 IMPLANT
WATER STERILE IRR 1000ML POUR (IV SOLUTION) ×2 IMPLANT

## 2011-12-27 NOTE — Anesthesia Preprocedure Evaluation (Addendum)
Anesthesia Evaluation  Patient identified by MRN, date of birth, ID band Patient awake    Reviewed: Allergy & Precautions, H&P , NPO status , Patient's Chart, lab work & pertinent test results  Airway Mallampati: I TM Distance: >3 FB Neck ROM: full    Dental  (+) Poor Dentition   Pulmonary sleep apnea ,          Cardiovascular hypertension, + CAD, + Peripheral Vascular Disease and +CHF + dysrhythmias Atrial Fibrillation     Neuro/Psych PSYCHIATRIC DISORDERS  Neuromuscular disease    GI/Hepatic GERD-  ,  Endo/Other    Renal/GU      Musculoskeletal   Abdominal   Peds  Hematology   Anesthesia Other Findings   Reproductive/Obstetrics                          Anesthesia Physical Anesthesia Plan  ASA: IV  Anesthesia Plan: General   Post-op Pain Management:    Induction: Intravenous  Airway Management Planned: Oral ETT  Additional Equipment:   Intra-op Plan:   Post-operative Plan: Extubation in OR  Informed Consent: I have reviewed the patients History and Physical, chart, labs and discussed the procedure including the risks, benefits and alternatives for the proposed anesthesia with the patient or authorized representative who has indicated his/her understanding and acceptance.     Plan Discussed with: Anesthesiologist, CRNA and Surgeon  Anesthesia Plan Comments:         Anesthesia Quick Evaluation

## 2011-12-27 NOTE — ED Provider Notes (Signed)
History     CSN: [id number]  Arrival date & time 12/27/11  1546   First MD Initiated Contact with Patient 12/27/11 1552      Chief Complaint  Patient presents with  . Active nose bleed     coumadin    (Consider location/radiation/quality/duration/timing/severity/associated sxs/prior treatment) Patient is a 77 y.o. female presenting with nosebleeds. The history is provided by the patient.  Epistaxis  This is a new problem. The current episode started 1 to 2 hours ago. The problem occurs constantly. The problem has been gradually worsening. The problem is associated with anticoagulants. The bleeding has been from the left nare. She has tried applying pressure (packing nostril with tissue) for the symptoms. The treatment provided no relief. Past medical history comments: CAD, HTN, and atrial fibrillation.    Past Medical History  Diagnosis Date  . OSA (obstructive sleep apnea)   . HTN (hypertension)   . Atrial fibrillation     paroxysmal; on coumadin  . CAD (coronary artery disease)     a. cath 7/11: LM 40%, mild plaque disease in CFX, LAD, and RCA;  b. CABG with S-LAD and S-OM done at time of Aortic dissection repair  . Diastolic heart failure   . Dissection of aorta, thoracic     a. Type A; s/p repair 7/11 with aortic root repair and CABG x 2   . ASD (atrial septal defect)     a. s/p repair 1982 at [facility]  . Right heart failure     a. 2/2 TR and RV dysfxn;  b. echo 4/12: EF 60%, mild LVH, mild AI, mild MR, mod LAE, mod RVE with mod dec. RVSF, mod RAE, mod to severe TR, PASP 58;  c. right heart cath 4/12:  RA mean 8, RV 46/1 with mean 6, PA 45/13 with mean 26, PCWP mean 14, CO 3.68, CI 2.1 (no sig pulmon HTN)  . GERD (gastroesophageal reflux disease)   . IBS (irritable bowel syndrome)   . Esophageal stricture   . Colonic polyp   . Peripheral neuropathy   . Back pain   . Fibromyalgia   . Anxiety   . HLD (hyperlipidemia)   . Borderline diabetes   . History of TIAs   . DJD  (degenerative joint disease)   . Normocytic anemia   . Thrombocytopenia   . Macular degeneration   . Aneurysm 2011    Disecting aortic aneurysm  . Depression   . Atrial septal defect   . Esophageal dysmotilities     Past Surgical History  Procedure Date  . Asd repair 1982    [facility]  . Rotator cuff repair 2007    right  . Cardioversion     x 3  . Tubal ligation   . Appendectomy   . Vaginal hysterectomy 1987    A/P Repair With Cystocele and rectocele repair  . Tonsillectomy   . Oophorectomy 1994    BSO  . Pelvic laparoscopy 1994  . Emergency redo median sternotomy for hemiarch repair of acute type a aortic  dissection 06/05/2010    Family History  Problem Relation Age of Onset  . Pancreatic cancer Sister   . Osteoporosis Sister   . Lung cancer Brother     x 2  . Heart attack Brother   . Melanoma Sister   . Diabetes Mother   . Hypertension Mother   . Colon cancer Sister   . ALS Mother   . Heart disease Brother  x 2    History  Substance Use Topics  . Smoking status: Never Smoker   . Smokeless tobacco: Never Used  . Alcohol Use: No    OB History    Grav Para Term Preterm Abortions TAB SAB Ect Mult Living   4 3 3  1     3       Review of Systems  Constitutional: Negative for fever, chills, activity change, appetite change and fatigue.  HENT: Positive for nosebleeds. Negative for congestion, facial swelling, rhinorrhea, neck pain, neck stiffness, postnasal drip and sinus pressure.   Respiratory: Negative for cough and shortness of breath.   Cardiovascular: Negative for chest pain.  Gastrointestinal: Negative for vomiting, abdominal pain and blood in stool.  Musculoskeletal: Negative for back pain.  Skin: Negative for color change and wound.  Neurological: Negative for syncope, weakness, light-headedness and headaches.  Psychiatric/Behavioral: Negative for confusion.    Allergies  Atorvastatin; Codeine; Erythromycin; Ezetimibe; Meperidine hcl;  Morphine; Ropinirole hydrochloride; Simvastatin; and Triple antibiotic  Home Medications   Current Outpatient Rx  Name Route Sig Dispense Refill  . AMOXICILLIN 500 MG PO CAPS Oral Take 500 mg by mouth. Take 4 capsules 1 hour prior dental procedure     . ASPIRIN 81 MG PO TABS Oral Take 81 mg by mouth daily.      . OCUVITE PO TABS Oral Take 1 tablet by mouth 2 (two) times daily.      Marland Kitchen CASANTHRANOL-DOCUSATE SODIUM 30-100 MG PO CAPS Oral Take by mouth. [facility] BRAND LAXA-w/ STOOL SOFTNER EVERY EVENING     . VITAMIN D 1000 UNITS PO CAPS Oral Take 1,000 Units by mouth daily.      Marland Kitchen CLORAZEPATE DIPOTASSIUM 7.5 MG PO TABS  TAKE ONE TABLET BY MOUTH THREE TIMES DAILY AS NEEDED FOR NERVES 90 tablet 1  . FLUTICASONE PROPIONATE 50 MCG/ACT NA SUSP  1-2 sprays in each nostril at bedtime 48 g 3  . FUROSEMIDE 80 MG PO TABS Oral Take 1 tablet (80 mg total) by mouth 2 (two) times daily. 180 tablet 2  . GABAPENTIN 300 MG PO CAPS Oral Take 1 capsule (300 mg total) by mouth 2 (two) times daily. 180 capsule 3  . GLUCOSAMINE-CHONDROITIN 500-400 MG PO TABS Oral Take 1 tablet by mouth 1 day or 1 dose.      Marland Kitchen METOLAZONE 2.5 MG PO TABS  Take One Tablet By Mouth 30 Minutes Prior To Furosemide Dose Every Monday and Thursday morning 30 tablet 3  . METOPROLOL TARTRATE 50 MG PO TABS Oral Take 1 tablet (50 mg total) by mouth 2 (two) times daily. 180 tablet 2  . OMEPRAZOLE 20 MG PO CPDR Oral Take 1 capsule (20 mg total) by mouth 2 (two) times daily. 180 capsule 3  . POTASSIUM CHLORIDE CRYS ER 20 MEQ PO TBCR  Take 2 tablets by mouth two times a day and take an extra potassium on Monday and Thursday with Metolazone 390 tablet 2  . ROSUVASTATIN CALCIUM 10 MG PO TABS  Take 1/2 tablet by mouth every other day 23 tablet 2  . SERTRALINE HCL 50 MG PO TABS Oral Take 1 tablet (50 mg total) by mouth daily. 90 tablet 0  . TRAMADOL HCL 50 MG PO TABS  Take 1 tablet three times daily as needed for pain 90 tablet 0  . WARFARIN SODIUM 5 MG PO TABS   TAKE AS DIRECTED 90 tablet 3  . ZOLPIDEM TARTRATE 10 MG PO TABS Oral Take 1 tablet (10  mg total) by mouth at bedtime as needed. 90 tablet 3    BP 136/96  Pulse 87  Resp 17  Physical Exam  Nursing note and vitals reviewed. Constitutional: She is oriented to person, place, and time. She appears well-developed and well-nourished.  Non-toxic appearance. No distress.  HENT:  Head: Normocephalic and atraumatic.  Nose: No sinus tenderness. Epistaxis (bright red blood from left nostril) is observed. Right sinus exhibits no maxillary sinus tenderness and no frontal sinus tenderness. Left sinus exhibits no maxillary sinus tenderness and no frontal sinus tenderness.  Mouth/Throat: Oropharynx is clear and moist.       Unable to visualize source of epistaxis of left nostril due to active bleeding from left nostril despite suction direct pressure and phenylephrine spray. Do not see source of bleeding in anterior nose. Blood draining into posterior oropharynx.    Eyes: Conjunctivae and EOM are normal. Pupils are equal, round, and reactive to light. No scleral icterus.  Neck: Normal range of motion. Neck supple. No JVD present.  Cardiovascular: Normal rate, regular rhythm, normal heart sounds and intact distal pulses.   No murmur heard. Pulmonary/Chest: Effort normal and breath sounds normal. No respiratory distress. She has no wheezes. She has no rales.  Abdominal: Soft. Bowel sounds are normal. She exhibits no distension. There is no tenderness. There is no rebound and no guarding.  Musculoskeletal: Normal range of motion.  Neurological: She is alert and oriented to person, place, and time. She has normal strength. No cranial nerve deficit. GCS eye subscore is 4. GCS verbal subscore is 5. GCS motor subscore is 6.  Skin: Skin is warm and dry. No rash noted. She is not diaphoretic.  Psychiatric: She has a normal mood and affect.    ED Course  Procedures (including critical care time)  Labs Reviewed    CBC - Abnormal; Notable for the following:    Hemoglobin 11.7 (*)    All other components within normal limits  BASIC METABOLIC PANEL - Abnormal; Notable for the following:    Glucose, Bld 194 (*)    BUN 32 (*)    GFR calc non Af Amer 81 (*)    All other components within normal limits  PROTIME-INR - Abnormal; Notable for the following:    Prothrombin Time 25.3 (*)    INR 2.26 (*)    All other components within normal limits  APTT - Abnormal; Notable for the following:    aPTT 43 (*)    All other components within normal limits  DIFFERENTIAL  TYPE AND SCREEN  PREPARE FRESH FROZEN PLASMA   No results found.   1. 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 She presents to the ED today due to epistaxis of left nostril. Onset after bending over to pick up something at a restaurant. No trauma or recent illness. Pt has tried direct pressure and packing it herself. Brisk bleeding from left nostril. Despite suctioning, topical phenylephrine, and direct pressure unable to get bleeding to stop. Do not visualize anterior source of bleeding and there is blood in posterior oropharynx. Suspect posterior source of bleeding. Long Rhinorocket applied. Bleeding appears to have stopped after Rhinorocket. Labs with coags sent. Will monitor.   Pt stopped bleeding temporarily with Rhinorocket. Bleeding returned in posterior nose and running down her throat. INR not supratherapeutic. ENT consulted.   ENT took out packing and cauterized at  bedside and also repacked nostril.   Pt still oozing blood. ENT offered to take pt to OR but pt declined. Hospitalist consulted for admission. Dr. Lavera Guise recommending INR reversal with IV vitamin K and FFP.      Verne Carrow, MD 12/27/11 2055

## 2011-12-27 NOTE — H&P (Signed)
12/27/11  [name]  PREOPERATIVE HISTORY AND PHYSICAL  CHIEF COMPLAINT: epistaxis  HISTORY: This is a 77 year old who presents with epistaxis.  She now presents for nasal endoscopy with cautery and packing.  Dr. Emeline Darling, [place] has discussed the risks, benefits, and alternatives of this procedure. The patient understands the risks and would like to proceed with the procedure. The chances of success of the procedure are ~75% and the patient understands this. I personally performed an examination of the patient within 24 hours of the procedure.  PAST MEDICAL HISTORY: Past Medical History  Diagnosis Date  . OSA (obstructive sleep apnea)   . HTN (hypertension)   . Atrial fibrillation     paroxysmal; on coumadin  . CAD (coronary artery disease)     a. cath 7/11: LM 40%, mild plaque disease in CFX, LAD, and RCA;  b. CABG with S-LAD and S-OM done at time of Aortic dissection repair  . Diastolic heart failure   . Dissection of aorta, thoracic     a. Type A; s/p repair 7/11 with aortic root repair and CABG x 2   . ASD (atrial septal defect)     a. s/p repair [facility] at [facility]  . Right heart failure     a. 2/2 TR and RV dysfxn;  b. echo 4/12: EF 60%, mild LVH, mild AI, mild MR, mod LAE, mod RVE with mod dec. RVSF, mod RAE, mod to severe TR, PASP 58;  c. right heart cath 4/12:  RA mean 8, RV 46/1 with mean 6, PA 45/13 with mean 26, PCWP mean 14, CO 3.68, CI 2.1 (no sig pulmon HTN)  . GERD (gastroesophageal reflux disease)   . IBS (irritable bowel syndrome)   . Esophageal stricture   . Colonic polyp   . Peripheral neuropathy   . Back pain   . Fibromyalgia   . Anxiety   . HLD (hyperlipidemia)   . Borderline diabetes   . History of TIAs   . DJD (degenerative joint disease)   . Normocytic anemia   . Thrombocytopenia   . Macular degeneration   . Aneurysm 2011    Disecting aortic aneurysm  . Depression   . Atrial septal defect   . Esophageal dysmotilities    PAST SURGICAL HISTORY: Past  Surgical History  Procedure Date  . Asd repair 1982    [facility]  . Rotator cuff repair 2007    right  . Cardioversion     x 3  . Tubal ligation   . Appendectomy   . Vaginal hysterectomy 1987    A/P Repair With Cystocele and rectocele repair  . Tonsillectomy   . Oophorectomy 1994    BSO  . Pelvic laparoscopy 1994  . Emergency redo median sternotomy for hemiarch repair of acute type a aortic  dissection 06/05/2010     MEDICATIONS:No current facility-administered medications for this encounter. Current outpatient prescriptions:beta carotene w/minerals (OCUVITE) tablet, Take 1 tablet by mouth 2 (two) times daily.  , Disp: , Rfl: ;  budesonide (RHINOCORT AQUA) 32 MCG/ACT nasal spray, Place 1 spray into the nose at bedtime., Disp: , Rfl: ;  Cholecalciferol (VITAMIN D) 1000 UNITS capsule, Take 1,000 Units by mouth daily.  , Disp: , Rfl:  clorazepate (TRANXENE) 7.5 MG tablet, Take 7.5 mg by mouth 3 (three) times daily as needed. For nerves., Disp: , Rfl: ;  furosemide (LASIX) 80 MG tablet, Take 80 mg by mouth 2 (two) times daily., Disp: , Rfl: ;  gabapentin (NEURONTIN) 300 MG  capsule, Take 600 mg by mouth at bedtime., Disp: , Rfl:  metolazone (ZAROXOLYN) 2.5 MG tablet, Take 2.5 mg by mouth See admin instructions. Take One Tablet By Mouth 30 Minutes Prior To Furosemide Dose Every Monday and Thursday morning, Disp: , Rfl: ;  metoprolol (LOPRESSOR) 50 MG tablet, Take 50 mg by mouth 2 (two) times daily., Disp: , Rfl: ;  omeprazole (PRILOSEC) 20 MG capsule, Take 20 mg by mouth 2 (two) times daily., Disp: , Rfl:  potassium chloride SA (K-DUR,KLOR-CON) 20 MEQ tablet, Take 40-60 mEq by mouth 2 (two) times daily. Take 2 tablets by mouth two times a day and take an extra potassium on Monday and Thursday with Metolazone, Disp: , Rfl: ;  rosuvastatin (CRESTOR) 5 MG tablet, Take 5 mg by mouth every other day., Disp: , Rfl: ;  sertraline (ZOLOFT) 50 MG tablet, Take 50 mg by mouth every morning., Disp: , Rfl:    traMADol (ULTRAM) 50 MG tablet, Take 50 mg by mouth every 8 (eight) hours as needed. For pain, Disp: , Rfl: ;  warfarin (COUMADIN) 5 MG tablet, Take 5-7.5 mg by mouth daily. Takes 1 tablet (5 MG) every day except Wednesdays; on Wednesdays take 1.5 tablets (7.5 MG), Disp: , Rfl: ;  zolpidem (AMBIEN) 10 MG tablet, Take 10 mg by mouth at bedtime as needed. For sleep., Disp: , Rfl:   ALLERGIES: Allergies  Allergen Reactions  . Atorvastatin     REACTION: muscle pain  . Codeine     REACTION: itching  . Erythromycin   . Ezetimibe     REACTION: INTOL to Zetia w/ cough  . Meperidine Hcl     REACTION: dizziness  . Morphine   . Ropinirole Hydrochloride     REACTION: INTOL to Requip w/ sleep paralysis  . Simvastatin     REACTION: unable to walk--muscle pain  . Triple Antibiotic     SOCIAL HISTORY: History   Social History  . Marital Status: Married    Spouse Name: N/A    Number of Children: N/A  . Years of Education: N/A   Occupational History  . retired Charity fundraiser    Social History Main Topics  . Smoking status: Never Smoker   . Smokeless tobacco: Never Used  . Alcohol Use: No  . Drug Use: No  . Sexually Active: Not on file   Other Topics Concern  . Not on file   Social History Narrative  . No narrative on file    FAMILY HISTORY: Family History  Problem Relation Age of Onset  . Pancreatic cancer Sister   . Osteoporosis Sister   . Lung cancer Brother     x 2  . Heart attack Brother   . Melanoma Sister   . Diabetes Mother   . Hypertension Mother   . Colon cancer Sister   . ALS Mother   . Heart disease Brother     x 2    REVIEW OF SYSTEMS:epistaxis, otherwise negative x 12 systems except per HPI   PHYSICAL EXAM:  GENERAL:   VITAL SIGNS:   Filed Vitals:   12/27/11 1546 12/27/11 1720  BP: 136/96 127/71  Pulse: 87 73  Resp: 17   SpO2:  97%   SKIN:  Warm, dry HEENT:  Epistaxis NECK:  Supple, trachea midline LUNGS:  Grossly clear CARDIOVASCULAR:   irregular ABDOMEN:  soft MUSCULOSKELETAL: normal strength PSYCH:  Awake, alert NEUROLOGIC:  Cn 2-12 intact and symmetric  Removed left rhinorocket and replaced with 9cm merocele with  surgicel with surgicel on right after silver nitrate and neosynephrine to left nasopharynx, ER states that patient is still bleeding although rate of bleeding slowed considerably.   ASSESSMENT AND PLAN: Plan to proceed with nasal endoscopy with cautery and packing. Patient understands the risks including perioperative MI or death, recurrent bleeding, benefits, and alternatives. Will request admission to hospitalist service given multiple medical comorbidities for monitoring postop. 12/27/2011 6:26 PM [name]

## 2011-12-27 NOTE — ED Notes (Signed)
[id number] Ready 

## 2011-12-27 NOTE — Progress Notes (Signed)
Patient [name], 77 year old white female arrived at E.D. Room 10 with a nose bleed after visiting her father-in-law who is a patient in the hospital.  Patient and her husband expressed appreciation for Chaplain provision of pastoral prayer, conversation, and presence.  No follow-up needed.

## 2011-12-27 NOTE — Anesthesia Procedure Notes (Signed)
Procedure Name: Intubation Date/Time: 12/27/2011 11:33 PM Performed by: Rossie Muskrat Pre-anesthesia Checklist: Patient identified, Patient being monitored, Timeout performed, Emergency Drugs available and Suction available Patient Re-evaluated:Patient Re-evaluated prior to inductionOxygen Delivery Method: Circle System Utilized Preoxygenation: Pre-oxygenation with 100% oxygen Intubation Type: IV induction Ventilation: Mask ventilation without difficulty Laryngoscope Size: Miller, 2, Mac and 3 Grade View: Grade II Tube type: Oral Tube size: 7.0 mm Number of attempts: 2 Airway Equipment and Method: stylet Placement Confirmation: ETT inserted through vocal cords under direct vision,  breath sounds checked- equal and bilateral and positive ETCO2 Secured at: 20 cm Tube secured with: Tape Dental Injury: Teeth and Oropharynx as per pre-operative assessment  Comments: ETT insertion attempted unsuccessfully x1 by J. Emmah Bratcher, Scientist, clinical (histocompatibility and immunogenetics). Grade 3 view, bloody oropharynx secondary to epistaxis present prior to induction. Oropharynx suctioned however visualization remained impaired. DL x 1 by Dr. Katrinka Blazing using Mac 3, Ett placed without difficulty, + ETCO2, =BBS, ETT secured.

## 2011-12-27 NOTE — Preoperative (Signed)
Beta Blockers   Reason not to administer Beta Blockers:B blocker taken @ 0900 12/27/11

## 2011-12-27 NOTE — ED Provider Notes (Signed)
4:01 PM  I performed a history and physical examination of [name] and discussed her management with Dr. Meredith Pel.  I agree with the history, physical, assessment, and plan of care, with the following exceptions: None  The patient is a 77 year old female on warfarin for treatment of atrial fibrillation who was in the hospital visiting a family member when she had a spontaneous onset of episode axis from the left near, not stopping with compression of the nose, and for which she presented to the emergency department. On arrival, the patient had active bleeding from the left near which Dr. Meredith Pel attempted to slow down with Neo-Synephrine spray, but the bleeding was brisk enough that he was unable to visualize the source of the bleeding directly, and placed a left nasal tampon which controlled the bleeding. The patient appears anxious, but in no respiratory distress, speaking full sentences. Skin is warm and dry.  I was present for the following procedures: None Time Spent in Critical Care of the patient: None Time spent in discussions with the patient and family: 5 minutes  [name] D    Felicia Bonier, MD 12/27/11 [phone]

## 2011-12-27 NOTE — ED Notes (Signed)
Pt was upstairs visiting husband and started bleeding from left nard and coming out mouth.  Pt is on coumadin.  Anxious.

## 2011-12-27 NOTE — H&P (Signed)
PCP:   Michele Mcalpine, MD, MD   Chief Complaint:  Epistaxis  HPI: 77 year old woman with known coronary disease, atrial fibrillation, and history of aortic dissection, presented to the emergency room today with epistaxis. This is brisk and arterial in nature. The emergency room team as well as the ear nose and throat Md on call Dr. Emeline Darling, have been unable to control the bleeding. The patient is currently spitting clots every 30 seconds.   Review of Systems:  Has chronic lower extremity edema ever since her heart surgery The patient denies anorexia, fever, weight loss,, vision loss, decreased hearing, hoarseness, chest pain, syncope, dyspnea on exertion, balance deficits, hemoptysis, abdominal pain, melena, hematochezia, severe indigestion/heartburn, hematuria, incontinence, genital sores, muscle weakness, suspicious skin lesions, transient blindness, difficulty walking, depression, unusual weight change,   Past Medical History: Past Medical History  Diagnosis Date  . OSA (obstructive sleep apnea)   . HTN (hypertension)   . Atrial fibrillation     paroxysmal; on coumadin  . CAD (coronary artery disease)     a. cath 7/11: LM 40%, mild plaque disease in CFX, LAD, and RCA;  b. CABG with S-LAD and S-OM done at time of Aortic dissection repair  . Diastolic heart failure   . Dissection of aorta, thoracic     a. Type A; s/p repair 7/11 with aortic root repair and CABG x 2   . ASD (atrial septal defect)     a. s/p repair 1982 at [facility]  . Right heart failure     a. 2/2 TR and RV dysfxn;  b. echo 4/12: EF 60%, mild LVH, mild AI, mild MR, mod LAE, mod RVE with mod dec. RVSF, mod RAE, mod to severe TR, PASP 58;  c. right heart cath 4/12:  RA mean 8, RV 46/1 with mean 6, PA 45/13 with mean 26, PCWP mean 14, CO 3.68, CI 2.1 (no sig pulmon HTN)  . GERD (gastroesophageal reflux disease)   . IBS (irritable bowel syndrome)   . Esophageal stricture   . Colonic polyp   . Peripheral neuropathy   . Back pain    . Fibromyalgia   . Anxiety   . HLD (hyperlipidemia)   . Borderline diabetes   . History of TIAs   . DJD (degenerative joint disease)   . Normocytic anemia   . Thrombocytopenia   . Macular degeneration   . Aneurysm 2011    Disecting aortic aneurysm  . Depression   . Atrial septal defect   . Esophageal dysmotilities    Past Surgical History  Procedure Date  . Asd repair 1982    [facility]  . Rotator cuff repair 2007    right  . Cardioversion     x 3  . Tubal ligation   . Appendectomy   . Vaginal hysterectomy 1987    A/P Repair With Cystocele and rectocele repair  . Tonsillectomy   . Oophorectomy 1994    BSO  . Pelvic laparoscopy 1994  . Emergency redo median sternotomy for hemiarch repair of acute type a aortic  dissection 06/05/2010    Medications: Prior to Admission medications   Medication Sig Start Date End Date Taking? Authorizing Provider  beta carotene w/minerals (OCUVITE) tablet Take 1 tablet by mouth 2 (two) times daily.     Yes Historical Provider, MD  budesonide (RHINOCORT AQUA) 32 MCG/ACT nasal spray Place 1 spray into the nose at bedtime.   Yes Historical Provider, MD  Cholecalciferol (VITAMIN D) 1000 UNITS capsule Take 1,000  Units by mouth daily.     Yes Historical Provider, MD  clorazepate (TRANXENE) 7.5 MG tablet Take 7.5 mg by mouth 3 (three) times daily as needed. For nerves.   Yes Historical Provider, MD  furosemide (LASIX) 80 MG tablet Take 80 mg by mouth 2 (two) times daily. 12/07/11  Yes Micheline Chapman, MD  gabapentin (NEURONTIN) 300 MG capsule Take 600 mg by mouth at bedtime. 10/11/11 10/10/12 Yes Michele Mcalpine, MD  metolazone (ZAROXOLYN) 2.5 MG tablet Take 2.5 mg by mouth See admin instructions. Take One Tablet By Mouth 30 Minutes Prior To Furosemide Dose Every Monday and Thursday morning 12/10/11  Yes Micheline Chapman, MD  metoprolol (LOPRESSOR) 50 MG tablet Take 50 mg by mouth 2 (two) times daily. 12/07/11 12/06/12 Yes Micheline Chapman, MD  omeprazole  (PRILOSEC) 20 MG capsule Take 20 mg by mouth 2 (two) times daily. 10/11/11 10/10/12 Yes Michele Mcalpine, MD  potassium chloride SA (K-DUR,KLOR-CON) 20 MEQ tablet Take 40-60 mEq by mouth 2 (two) times daily. Take 2 tablets by mouth two times a day and take an extra potassium on Monday and Thursday with Metolazone 12/07/11  Yes Micheline Chapman, MD  rosuvastatin (CRESTOR) 5 MG tablet Take 5 mg by mouth every other day.   Yes Historical Provider, MD  sertraline (ZOLOFT) 50 MG tablet Take 50 mg by mouth every morning.   Yes Historical Provider, MD  traMADol (ULTRAM) 50 MG tablet Take 50 mg by mouth every 8 (eight) hours as needed. For pain 12/13/11  Yes Michele Mcalpine, MD  warfarin (COUMADIN) 5 MG tablet Take 5-7.5 mg by mouth daily. Takes 1 tablet (5 MG) every day except Wednesdays; on Wednesdays take 1.5 tablets (7.5 MG)   Yes Historical Provider, MD  zolpidem (AMBIEN) 10 MG tablet Take 10 mg by mouth at bedtime as needed. For sleep. 10/11/11 10/10/12 Yes Michele Mcalpine, MD    Allergies:   Allergies  Allergen Reactions  . Atorvastatin     REACTION: muscle pain  . Codeine     REACTION: itching  . Erythromycin   . Ezetimibe     REACTION: INTOL to Zetia w/ cough  . Meperidine Hcl     REACTION: dizziness  . Morphine   . Ropinirole Hydrochloride     REACTION: INTOL to Requip w/ sleep paralysis  . Simvastatin     REACTION: unable to walk--muscle pain  . Triple Antibiotic     Social History:  reports that she has never smoked. She has never used smokeless tobacco. She reports that she does not drink alcohol or use illicit drugs.  History   Social History Narrative  . No narrative on file     Family History: Family History  Problem Relation Age of Onset  . Pancreatic cancer Sister   . Osteoporosis Sister   . Lung cancer Brother     x 2  . Heart attack Brother   . Melanoma Sister   . Diabetes Mother   . Hypertension Mother   . Colon cancer Sister   . ALS Mother   . Heart disease  Brother     x 2    Physical Exam: Filed Vitals:   12/27/11 1546 12/27/11 1720 12/27/11 2017 12/27/11 2030  BP: 136/96 127/71 145/68 153/71  Pulse: 87 73 78 78  Resp: 17  21 16   SpO2:  97% 100% 100%   General appearance: alert, cooperative and appears stated age Head: Normocephalic, without obvious abnormality, atraumatic  Eyes: conjunctivae/corneas clear. PERRL, EOM's intact. Fundi benign. Nose: Left nare with packing Throat: fresh blood Neck: no adenopathy, no carotid bruit, no JVD, supple, symmetrical, trachea midline and thyroid not enlarged, symmetric, no tenderness/mass/nodules Resp: clear to auscultation bilaterally Chest wall: no tenderness Cardio: regular rate and rhythm, S1, S2 normal, no murmur, click, rub or gallop GI: soft, non-tender; bowel sounds normal; no masses,  no organomegaly Extremities: edema bilat LE Pulses: 2+ and symmetric Skin: Skin color, texture, turgor normal. No rashes or lesions Neurologic: Grossly normal   Labs on Admission:   [facility]  NA 139  K 3.9  CL 96  CO2 27  GLUCOSE 194*  BUN 32*  CREATININE 0.75  CALCIUM 9.7  MG --  PHOS --   Basename 12/27/11 1554  WBC 5.2  NEUTROABS 3.4  HGB 11.7*  HCT 37.0  MCV 95.1  PLT 157   Prothrombin Time/INR  Results for ALLETTA, MATTOS (MRN [id number]) as of 12/27/2011 22:00  Ref. Range 12/27/2011 15:54  Prothrombin Time Latest Range: 11.6-15.2 seconds 25.3 (H)  INR Latest Range: 0.00-1.49  2.26 (H)   Radiological Exams on Admission: No results found.  Assessment/Plan  77 year old woman with extensive cardiac history, presenting with severe epistaxis in the setting of anticoagulation. We will admit her to step down, reverse her Coumadin with vitamin K and fresh frozen plasma and if the bleeding continue will reconsult Dr. Emeline Darling for operative management. For now the patient is n.p.o. we are going to have to hold all her medications. We will give Lasix intravenously in between  transfusions of fresh frozen plasma.  Present on Admission:  .Arterial epistaxis .Anemia due to blood loss, acute .Aneurysm .DISSECTING AORTIC ANEURYSM THORACIC .Edema .TRANSIENT ISCHEMIC ATTACKS, HX OF .HYPERCHOLESTEROLEMIA .HYPERTENSION .Atrial fibrillation  Rahiem Schellinger 12/27/2011, 9:55 PM

## 2011-12-27 NOTE — ED Notes (Signed)
MD to bedside and Rhinorocket inserted and bleeding has stopped and not seeing any bleeding in back of throat.

## 2011-12-28 ENCOUNTER — Encounter (HOSPITAL_COMMUNITY): Payer: Self-pay | Admitting: Otolaryngology

## 2011-12-28 LAB — CBC
HCT: 34.3 % — ABNORMAL LOW (ref 36.0–46.0)
HCT: 30.3 % — ABNORMAL LOW (ref 36.0–46.0)
MCH: 29.9 pg (ref 26.0–34.0)
MCHC: 31.7 g/dL (ref 30.0–36.0)
MCV: 94.2 fL (ref 78.0–100.0)
RBC: 3.64 MIL/uL — ABNORMAL LOW (ref 3.87–5.11)
RDW: 14.2 % (ref 11.5–15.5)
RDW: 14.4 % (ref 11.5–15.5)
WBC: 6.4 10*3/uL (ref 4.0–10.5)
WBC: 5.7 10*3/uL (ref 4.0–10.5)

## 2011-12-28 LAB — BASIC METABOLIC PANEL
BUN: 16 mg/dL (ref 6–23)
CO2: 30 mEq/L (ref 19–32)
Chloride: 100 mEq/L (ref 96–112)
Chloride: 100 mEq/L (ref 96–112)
Creatinine, Ser: 0.58 mg/dL (ref 0.50–1.10)
Creatinine, Ser: 0.54 mg/dL (ref 0.50–1.10)
Glucose, Bld: 153 mg/dL — ABNORMAL HIGH (ref 70–99)
Glucose, Bld: 137 mg/dL — ABNORMAL HIGH (ref 70–99)
Potassium: 3.3 mEq/L — ABNORMAL LOW (ref 3.5–5.1)

## 2011-12-28 LAB — PROTIME-INR
INR: 1.25 (ref 0.00–1.49)
Prothrombin Time: 16 seconds — ABNORMAL HIGH (ref 11.6–15.2)

## 2011-12-28 LAB — GLUCOSE, CAPILLARY
Glucose-Capillary: 136 mg/dL — ABNORMAL HIGH (ref 70–99)
Glucose-Capillary: 145 mg/dL — ABNORMAL HIGH (ref 70–99)

## 2011-12-28 MED ORDER — GABAPENTIN 300 MG PO CAPS
300.0000 mg | ORAL_CAPSULE | Freq: Every day | ORAL | Status: DC
  Administered 2011-12-28 – 2011-12-30 (×3): 300 mg via ORAL
  Filled 2011-12-28 (×4): qty 1

## 2011-12-28 MED ORDER — POTASSIUM CHLORIDE CRYS ER 20 MEQ PO TBCR: 40.0000 meq | EXTENDED_RELEASE_TABLET | Freq: Two times a day (BID) | ORAL | Status: DC

## 2011-12-28 MED ORDER — ZOLPIDEM TARTRATE 5 MG PO TABS: 5.0000 mg | ORAL_TABLET | Freq: Every evening | ORAL | Status: DC | PRN

## 2011-12-28 MED ORDER — SODIUM CHLORIDE 0.9 % IV SOLN
INTRAVENOUS | Status: DC
  Administered 2011-12-28: 03:00:00 via INTRAVENOUS

## 2011-12-28 MED ORDER — CLORAZEPATE DIPOTASSIUM 7.5 MG PO TABS
7.5000 mg | ORAL_TABLET | Freq: Three times a day (TID) | ORAL | Status: DC | PRN
  Administered 2011-12-28 – 2011-12-31 (×7): 7.5 mg via ORAL
  Filled 2011-12-28 (×7): qty 1

## 2011-12-28 MED ORDER — ALBUMIN HUMAN 5 % IV SOLN
INTRAVENOUS | Status: AC
  Administered 2011-12-28: 12.5 g via INTRAVENOUS
  Filled 2011-12-28: qty 250

## 2011-12-28 MED ORDER — BISACODYL 10 MG RE SUPP
10.0000 mg | Freq: Every day | RECTAL | Status: DC | PRN
  Administered 2011-12-30: 10 mg via RECTAL
  Filled 2011-12-28 (×2): qty 1

## 2011-12-28 MED ORDER — PANTOPRAZOLE SODIUM 40 MG PO TBEC
40.0000 mg | DELAYED_RELEASE_TABLET | Freq: Every day | ORAL | Status: DC
  Administered 2011-12-28 – 2011-12-31 (×4): 40 mg via ORAL
  Filled 2011-12-28 (×4): qty 1

## 2011-12-28 MED ORDER — HYDROMORPHONE HCL PF 1 MG/ML IJ SOLN
0.2500 mg | INTRAMUSCULAR | Status: DC | PRN
  Administered 2011-12-28 (×2): 0.5 mg via INTRAVENOUS

## 2011-12-28 MED ORDER — ACETAMINOPHEN 650 MG RE SUPP: 650.0000 mg | Freq: Four times a day (QID) | RECTAL | Status: DC | PRN

## 2011-12-28 MED ORDER — FUROSEMIDE 80 MG PO TABS
80.0000 mg | ORAL_TABLET | Freq: Two times a day (BID) | ORAL | Status: DC
  Filled 2011-12-28 (×2): qty 1

## 2011-12-28 MED ORDER — HYDROMORPHONE HCL PF 1 MG/ML IJ SOLN
0.5000 mg | INTRAMUSCULAR | Status: DC | PRN
  Administered 2011-12-28 (×3): 0.5 mg via INTRAVENOUS
  Filled 2011-12-28 (×3): qty 1

## 2011-12-28 MED ORDER — POTASSIUM CHLORIDE CRYS ER 20 MEQ PO TBCR
40.0000 meq | EXTENDED_RELEASE_TABLET | Freq: Every day | ORAL | Status: DC
  Administered 2011-12-28 – 2011-12-29 (×2): 40 meq via ORAL
  Filled 2011-12-28 (×2): qty 2

## 2011-12-28 MED ORDER — FLEET ENEMA 7-19 GM/118ML RE ENEM: 1.0000 | ENEMA | Freq: Once | RECTAL | Status: AC | PRN

## 2011-12-28 MED ORDER — CEFAZOLIN SODIUM 1-5 GM-% IV SOLN
1.0000 g | Freq: Two times a day (BID) | INTRAVENOUS | Status: DC
  Filled 2011-12-28 (×2): qty 50

## 2011-12-28 MED ORDER — MIDAZOLAM HCL 2 MG/2ML IJ SOLN
1.0000 mg | INTRAMUSCULAR | Status: DC | PRN
  Administered 2011-12-28: 1 mg via INTRAVENOUS

## 2011-12-28 MED ORDER — ONDANSETRON HCL 4 MG/2ML IJ SOLN: 4.0000 mg | Freq: Once | INTRAMUSCULAR | Status: DC | PRN

## 2011-12-28 MED ORDER — SERTRALINE HCL 50 MG PO TABS
50.0000 mg | ORAL_TABLET | Freq: Every day | ORAL | Status: DC
  Administered 2011-12-29 – 2011-12-31 (×3): 50 mg via ORAL
  Filled 2011-12-28 (×3): qty 1

## 2011-12-28 MED ORDER — HYDROMORPHONE HCL PF 1 MG/ML IJ SOLN: 0.5000 mg | INTRAMUSCULAR | Status: DC | PRN

## 2011-12-28 MED ORDER — FUROSEMIDE 10 MG/ML IJ SOLN
40.0000 mg | Freq: Once | INTRAMUSCULAR | Status: DC
  Filled 2011-12-28: qty 4

## 2011-12-28 MED ORDER — SENNOSIDES-DOCUSATE SODIUM 8.6-50 MG PO TABS
2.0000 | ORAL_TABLET | Freq: Every day | ORAL | Status: DC
  Administered 2011-12-28 – 2011-12-30 (×3): 2 via ORAL
  Filled 2011-12-28 (×4): qty 2

## 2011-12-28 MED ORDER — CEPHALEXIN 500 MG PO CAPS
500.0000 mg | ORAL_CAPSULE | Freq: Two times a day (BID) | ORAL | Status: DC
  Administered 2011-12-28 – 2011-12-31 (×6): 500 mg via ORAL
  Filled 2011-12-28 (×7): qty 1

## 2011-12-28 MED ORDER — POTASSIUM CHLORIDE 10 MEQ/100ML IV SOLN
INTRAVENOUS | Status: AC
Start: 2011-12-28 — End: 2011-12-28
  Administered 2011-12-28: 10 meq via INTRAVENOUS
  Filled 2011-12-28: qty 100

## 2011-12-28 MED ORDER — FUROSEMIDE 80 MG PO TABS
80.0000 mg | ORAL_TABLET | Freq: Every day | ORAL | Status: DC
  Administered 2011-12-29 – 2011-12-31 (×3): 80 mg via ORAL
  Filled 2011-12-28 (×4): qty 1

## 2011-12-28 MED ORDER — ALUM & MAG HYDROXIDE-SIMETH 200-200-20 MG/5ML PO SUSP
30.0000 mL | Freq: Four times a day (QID) | ORAL | Status: DC | PRN
  Filled 2011-12-28: qty 30

## 2011-12-28 MED ORDER — ONDANSETRON HCL 4 MG PO TABS: 4.0000 mg | ORAL_TABLET | Freq: Four times a day (QID) | ORAL | Status: DC | PRN

## 2011-12-28 MED ORDER — SODIUM CHLORIDE 0.9 % IJ SOLN
3.0000 mL | Freq: Two times a day (BID) | INTRAMUSCULAR | Status: DC
  Administered 2011-12-28 – 2011-12-31 (×7): 3 mL via INTRAVENOUS

## 2011-12-28 MED ORDER — OXYCODONE HCL 5 MG PO TABS
10.0000 mg | ORAL_TABLET | ORAL | Status: DC | PRN
  Administered 2011-12-28 – 2011-12-29 (×7): 10 mg via ORAL
  Administered 2011-12-30: 5 mg via ORAL
  Administered 2011-12-30 – 2011-12-31 (×3): 10 mg via ORAL
  Filled 2011-12-28 (×2): qty 2
  Filled 2011-12-28: qty 1
  Filled 2011-12-28 (×4): qty 2
  Filled 2011-12-28: qty 1
  Filled 2011-12-28 (×4): qty 2

## 2011-12-28 MED ORDER — FUROSEMIDE 10 MG/ML IJ SOLN
10.0000 mg | Freq: Once | INTRAMUSCULAR | Status: DC
  Filled 2011-12-28: qty 1

## 2011-12-28 MED ORDER — HYDROMORPHONE HCL PF 1 MG/ML IJ SOLN
INTRAMUSCULAR | Status: AC
  Administered 2011-12-28: 0.5 mg via INTRAVENOUS
  Filled 2011-12-28: qty 1

## 2011-12-28 MED ORDER — POLYETHYLENE GLYCOL 3350 17 G PO PACK
17.0000 g | PACK | Freq: Every day | ORAL | Status: DC | PRN
  Filled 2011-12-28 (×3): qty 1

## 2011-12-28 MED ORDER — METOPROLOL TARTRATE 50 MG PO TABS
50.0000 mg | ORAL_TABLET | Freq: Two times a day (BID) | ORAL | Status: DC
  Filled 2011-12-28 (×2): qty 1

## 2011-12-28 MED ORDER — WHITE PETROLATUM GEL
Status: AC
  Administered 2011-12-28: 0.2
  Filled 2011-12-28: qty 5

## 2011-12-28 MED ORDER — DOCUSATE SODIUM 100 MG PO CAPS
100.0000 mg | ORAL_CAPSULE | Freq: Two times a day (BID) | ORAL | Status: DC
  Administered 2011-12-28 – 2011-12-31 (×6): 100 mg via ORAL
  Filled 2011-12-28 (×7): qty 1

## 2011-12-28 MED ORDER — SODIUM CHLORIDE 0.9 % IV SOLN: 250.0000 mL | INTRAVENOUS | Status: DC | PRN

## 2011-12-28 MED ORDER — MIDAZOLAM HCL 2 MG/2ML IJ SOLN
INTRAMUSCULAR | Status: AC
  Filled 2011-12-28: qty 2

## 2011-12-28 MED ORDER — ACETAMINOPHEN 325 MG PO TABS
650.0000 mg | ORAL_TABLET | Freq: Four times a day (QID) | ORAL | Status: DC | PRN
  Administered 2011-12-29 – 2011-12-31 (×5): 650 mg via ORAL
  Filled 2011-12-28 (×8): qty 2

## 2011-12-28 MED ORDER — SODIUM CHLORIDE 0.9 % IJ SOLN: 3.0000 mL | INTRAMUSCULAR | Status: DC | PRN

## 2011-12-28 MED ORDER — POTASSIUM CHLORIDE 10 MEQ/100ML IV SOLN
10.0000 meq | INTRAVENOUS | Status: AC
  Administered 2011-12-28 (×4): 10 meq via INTRAVENOUS
  Filled 2011-12-28 (×3): qty 100

## 2011-12-28 MED ORDER — METOPROLOL TARTRATE 50 MG PO TABS
50.0000 mg | ORAL_TABLET | Freq: Two times a day (BID) | ORAL | Status: DC
  Administered 2011-12-28 – 2011-12-31 (×6): 50 mg via ORAL
  Filled 2011-12-28 (×8): qty 1

## 2011-12-28 MED ORDER — ONDANSETRON HCL 4 MG/2ML IJ SOLN
4.0000 mg | Freq: Four times a day (QID) | INTRAMUSCULAR | Status: DC | PRN
  Administered 2011-12-28: 4 mg via INTRAVENOUS
  Filled 2011-12-28: qty 2

## 2011-12-28 NOTE — Progress Notes (Signed)
   CARE MANAGEMENT NOTE 12/28/2011  Patient:  MICOLE, DELEHANTY   Account Number:  [id number]  Date Initiated:  12/28/2011  Documentation initiated by:  Onnie Boer  Subjective/Objective Assessment:   PT WAS ADMITTED WITH A NOSE BLEED     Action/Plan:   PROGRESSION OF CARE AND DISCHARGE PLANNING   Anticipated DC Date:  12/30/2011   Anticipated DC Plan:  HOME/SELF CARE      DC Planning Services  CM consult      Choice offered to / List presented to:             Status of service:  In process, will continue to follow Medicare Important Message given?   (If response is "NO", the following Medicare IM given date fields will be blank) Date Medicare IM given:   Date Additional Medicare IM given:    Discharge Disposition:    Per UR Regulation:  Reviewed for med. necessity/level of care/duration of stay  Comments:  UR COMPLETED.  Macee Venables, RN,BSN  12/28/11 1203 PT WAS ADMITTED WITH A NOSE BLD WHILE VISISTING HER HUSBAND, AT THIS TIME WE ARE AWAITING HER PT/OT EVAL TO ASSESS HER HH NEEDS.

## 2011-12-28 NOTE — Progress Notes (Signed)
Subjective: Feels very tired. Pain in face and nose due to nasal packing. Patient and daughter are concerned about why she is having frequent nose bleeds and what can be done to prevent future bleeds.   Objective: Blood pressure 114/52, pulse 86, temperature 99.1 F (37.3 C), temperature source Oral, resp. rate 17, height 5\' 7"  (1.702 m), weight 72.9 kg (160 lb 11.5 oz), SpO2 97.00%. Weight change:   Intake/Output Summary (Last 24 hours) at 12/28/11 1945 Last data filed at 12/28/11 1600  Gross per 24 hour  Intake   2645 ml  Output   3800 ml  Net  -1155 ml    Physical Exam: General appearance: alert and cooperative Nose: Nares normal. Septum midline. Mucosa normal. No drainage or sinus tenderness., mild swelling, b/l nasal packing Lungs: clear to auscultation bilaterally Heart: regular rate and rhythm, S1, S2 normal, no murmur, click, rub or gallop Abdomen: soft, non-tender; bowel sounds normal; no masses,  no organomegaly Extremities: extremities normal, atraumatic, no cyanosis or edema  Lab Results:  Basename 12/28/11 0959 12/28/11 0311  NA 141 141  K 3.3* 2.5*  CL 100 100  CO2 31 30  GLUCOSE 137* 153*  BUN 16 23  CREATININE 0.54 0.58  CALCIUM 9.3 9.5  MG -- --  PHOS -- --   No results found for this basename: AST:2,ALT:2,ALKPHOS:2,BILITOT:2,PROT:2,ALBUMIN:2 in the last 72 hours No results found for this basename: LIPASE:2,AMYLASE:2 in the last 72 hours  Basename 12/28/11 1650 12/28/11 0311 12/27/11 1554  WBC 5.7 6.4 --  NEUTROABS -- -- 3.4  HGB 9.6* 10.9* --  HCT 30.3* 34.3* --  MCV 94.7 94.2 --  PLT 105* 119* --   No results found for this basename: CKTOTAL:3,CKMB:3,CKMBINDEX:3,TROPONINI:3 in the last 72 hours No components found with this basename: POCBNP:3 No results found for this basename: DDIMER:2 in the last 72 hours No results found for this basename: HGBA1C:2 in the last 72 hours No results found for this basename:  CHOL:2,HDL:2,LDLCALC:2,TRIG:2,CHOLHDL:2,LDLDIRECT:2 in the last 72 hours No results found for this basename: TSH,T4TOTAL,FREET3,T3FREE,THYROIDAB in the last 72 hours No results found for this basename: VITAMINB12:2,FOLATE:2,FERRITIN:2,TIBC:2,IRON:2,RETICCTPCT:2 in the last 72 hours  Micro Results: No results found for this or any previous visit (from the past 240 hour(s)).  Studies/Results: No results found.  Medications: Scheduled Meds:   . albumin human      . cephALEXin  500 mg Oral Q12H  . docusate sodium  100 mg Oral BID  . furosemide  10 mg Intravenous Once  . furosemide  80 mg Oral Daily  . gabapentin  300 mg Oral QHS  . metoprolol tartrate  50 mg Oral BID  . midazolam      . pantoprazole  40 mg Oral Q1200  . phytonadione (VITAMIN K) IV  10 mg Intravenous To Major  . potassium chloride  10 mEq Intravenous Q1 Hr x 4  . potassium chloride SA  40 mEq Oral Daily  . senna-docusate  2 tablet Oral QHS  . sertraline  50 mg Oral Daily  . sodium chloride  3 mL Intravenous Q12H  . white petrolatum      . DISCONTD:  ceFAZolin (ANCEF) IV  1 g Intravenous Q12H  . DISCONTD: furosemide  40 mg Intravenous Once  . DISCONTD: furosemide  80 mg Oral BID  . DISCONTD: metoprolol tartrate  50 mg Oral BID  . DISCONTD: potassium chloride SA  40 mEq Oral BID   Continuous Infusions:   . DISCONTD: sodium chloride 75 mL/hr at 12/28/11  0314   PRN Meds:.sodium chloride, acetaminophen, acetaminophen, alum & mag hydroxide-simeth, bisacodyl, clorazepate, HYDROmorphone, ondansetron (ZOFRAN) IV, ondansetron, oxyCODONE, polyethylene glycol, sodium chloride, sodium phosphate, zolpidem, DISCONTD: HYDROmorphone, DISCONTD: HYDROmorphone, DISCONTD: lidocaine-EPINEPHrine, DISCONTD: midazolam, DISCONTD: ondansetron (ZOFRAN) IV, DISCONTD: oxymetazoline  Assessment/Plan: Principal Problem:  *Arterial epistaxis- currently controlled with packing. Will go ahead and start Keflex. She will continue to be followed in  the step down unit. She had just spit out a small clot. She complains of significant pain and states that the Dilaudid does not completely take away the discomfort and only last for about an hour. She is open to trying Oxycodone. She has been advised the packing will be in for another 4-5 days after which she will f/u with ENT to have it removed and to further evaluate for the source of her nose bleeds.  She uses Rhinocort which I have explained can cause nose bleeds.    Active Problems:  Atrial fibrillation- currently sinus rhythm  Coagulopathy, acquired due to Coumadin- Coumadin and ASA on hold for now. INR has been reversed completley. Will allow ENT to further advise on when to resume meds.    Anemia due to blood loss, acute- mild drop in hemoglobin. Follow.   Right heart failure- resumed Lasix - to be held if BP low.   HYPERTENSION- resumed Lasix and Metoprolol with holding parameters DISSECTING AORTIC ANEURYSM THORACIC- s/p repair  TRANSIENT ISCHEMIC ATTACKS, HX OF HYPERCHOLESTEROLEMIA Chronic pedal edema CAD s/p CABG Peripheral neuropathy   LOS: 1 day   [facility] [phone] 12/28/2011, 7:45 PM

## 2011-12-28 NOTE — Op Note (Signed)
DATE OF OPERATION: @T @ Surgeon: Melvenia Beam Procedure Performed: 16109-60 endoscopic ligation of bilateral sphenopalatine artery, bilateral nasal endoscopy with control of epistaxis.  PREOPERATIVE DIAGNOSIS: refractory acute epistaxis POSTOPERATIVE DIAGNOSIS: refractory acute epistaxis  SURGEON: Melvenia Beam ANESTHESIA: General endotracheal.  ESTIMATED BLOOD LOSS: Approximately 250 mL.  DRAINS/DRESSINGS: Foley in right nasopharynx, bilateral 9cm merocele packs SPECIMENS: none INDICATIONS: The patient is a 60 with a history of Afib on Coumadin, had acute left epistaxis today unable to be controlled in ER with multiple packs so taken to OR for nasal endoscopy, cautery, bilateral packing. DESCRIPTION OF OPERATION: The patient was brought to the operating room and was placed in the supine position and was placed under general endotracheal anesthesia by anesthesiology. The patient's nose was inspected, the nose was decongested with Afrin-coated pledgets which were then removed. The patient was prepped and draped in the usual sterile fashion. After the Afrin pledgets were removed, the inferior turbinates were outfractured.  I first began on the left. The middle turbinate was deflected medially and the crista ethmoidalis was identified. The Cottle was used to elevate the mucosa off of the crista ethmoidalis and the sphenopalatine foramen was identified with the sphenopalatine artery coursing out of it. TheBovie suction cautery was used to ligate and cauterize the left sphenopalatine artery with great difficulty. The posterior septal branch of the SPA coming over the arch of the choana was also cauterized, as well as diffuse oozing from the septum and right middle and inferior turbinates with great difficulty. There was also noted to be epistaxis from the right side as well. Next we turned to the right side. The middle turbinate was deflected medially and the crista ethmoidalis was identified. The Cottle  was used to elevate the mucosa off of the crista ethmoidalis and the sphenopalatine foramen was identified with the sphenopalatine artery coursing out of it. The Bovie suction cautery was used to ligate and cauterize the right sphenopalatine artery. This oozed for quite some time and a significant amount of time was spent obtaining hemostasis right and left. Once hemostasis was essentially achieved, a 14 french foley was placed in the posterior nasopharynx and inflated with 15mL of saline. Bilateral 9cm meroceles wrapped in surgicel and Bactroban ointment were placed in the nasal cavity bilaterally, and surgicel was packed around these. The stomach was suctioned out after bilateral greater palatine blocjks were placed and the patient was turned back to anesthesia and awakened from anesthesia and extubated without difficulty. The patient tolerated the procedure well with no immediate complications and was taken to the postoperative recovery area in stable condition.   Dr. Melvenia Beam was present and performed the entire procedure. 12/28/2011 1:03 AM Melvenia Beam

## 2011-12-28 NOTE — Significant Event (Signed)
Np to bedside when pt received from PACU to 3300. Drowsy but appropriate. Packing and tubing to nares per ENT. Post op vitals stable. Post op orders placed. Had i unit of FFP in ED. Will get PT/INR/PTT/H/H now and await results prior to administrating 2nd unit. Pt has CHF and takes Lasix/kcl at home. Holding that for now and will restart this a.m. If pt is stable postop and well hydrated.  Maren Reamer, NP [facility]

## 2011-12-28 NOTE — ED Provider Notes (Signed)
Evaluation and management procedures were performed by the resident physician under my supervision/collaboration.  I evaluated this patient face-to-face at the time of encounter.  Please see my note dated at that time.  [name], MD 12/28/11 [phone]

## 2011-12-28 NOTE — Anesthesia Postprocedure Evaluation (Signed)
  Anesthesia Post-op Note  Patient: [name]  Procedure(s) Performed: Procedure(s) (LRB): EPISTAXIS CONTROL (N/A)  Patient Location: PACU  Anesthesia Type: General  Level of Consciousness: awake, alert , oriented and patient cooperative  Airway and Oxygen Therapy: Patient Spontanous Breathing and Patient connected to nasal cannula oxygen  Post-op Pain: moderate  Post-op Assessment: Post-op Vital signs reviewed, Patient's Cardiovascular Status Stable, Respiratory Function Stable, Patent Airway, No signs of Nausea or vomiting and Pain level controlled  Post-op Vital Signs: stable  Complications: No apparent anesthesia complications

## 2011-12-28 NOTE — Transfer of Care (Signed)
Immediate Anesthesia Transfer of Care Note  Patient: [name]  Procedure(s) Performed: Procedure(s) (LRB): EPISTAXIS CONTROL (N/A)  Patient Location: PACU  Anesthesia Type: General  Level of Consciousness: awake, alert  and oriented  Airway & Oxygen Therapy: Patient Spontanous Breathing and Patient connected to face mask oxygen  Post-op Assessment: Report given to PACU RN and Patient moving all extremities X 4  Post vital signs: Reviewed and stable  Complications: No apparent anesthesia complications

## 2011-12-28 NOTE — Progress Notes (Signed)
Asked to see patient secondary to Foley catheter extruding through the mouth. Patient had severe epistaxis starting from the left side yesterday. She was packed by the emergency room and then Dr. Emeline Darling and then subsequently continued to bleed. Anterior and posterior packs were placed in the operating room. The patient has had no further bleeding since the operation. The patient had either a sneezing or coughing episode and the balloon of the Foley catheter came out the mouth. She has no bleeding. When I arrived the Foley catheter was hanging out of the mouth balloon still inflated. The family and patient immediately started telling me that they were very dissatisfied with the surgeon who performed the surgery and no longer wanted for him to care for her case. The Foley catheter has been out for approximately 1 hour with no active bleeding. She is being reversed of her Coumadin and she is getting FFP. The Foley catheter was deflated and removed. She still has bilateral what appears to be Murocel packs. There is no bleeding coming from the nasopharynx. She will continue her medical therapy and stay in the intensive care unit today for observation. We began discussing what the course of the situation would be and if she improves and has no further bleeding within 24 hours then she will may be able to go home with the anterior packs. The patient needs to be on Staphylococcus covering antibiotic while the packs are in place.

## 2011-12-29 LAB — PREPARE FRESH FROZEN PLASMA
Unit division: 0
Unit division: 0

## 2011-12-29 LAB — CBC
HCT: 32.6 % — ABNORMAL LOW (ref 36.0–46.0)
MCHC: 31.3 g/dL (ref 30.0–36.0)
Platelets: 106 10*3/uL — ABNORMAL LOW (ref 150–400)
RDW: 14.6 % (ref 11.5–15.5)
WBC: 5.9 10*3/uL (ref 4.0–10.5)

## 2011-12-29 LAB — PROTIME-INR
INR: 1.32 (ref 0.00–1.49)
Prothrombin Time: 16.6 seconds — ABNORMAL HIGH (ref 11.6–15.2)

## 2011-12-29 MED ORDER — ROSUVASTATIN CALCIUM 5 MG PO TABS
5.0000 mg | ORAL_TABLET | ORAL | Status: DC
  Administered 2011-12-29 – 2011-12-31 (×2): 5 mg via ORAL
  Filled 2011-12-29 (×2): qty 1

## 2011-12-29 MED ORDER — POTASSIUM CHLORIDE CRYS ER 20 MEQ PO TBCR
40.0000 meq | EXTENDED_RELEASE_TABLET | Freq: Two times a day (BID) | ORAL | Status: DC
  Administered 2011-12-29 – 2011-12-31 (×4): 40 meq via ORAL
  Filled 2011-12-29 (×2): qty 2
  Filled 2011-12-29: qty 1
  Filled 2011-12-29 (×4): qty 2

## 2011-12-29 NOTE — Progress Notes (Signed)
She looks much better today and there has been no significant bleeding. The packs are in place and no evidence of bleeding. She can go home from my standpoint at this point but must be on a Staphylococcus antibiotic. She should followup in approximately 5 days or so. Call if there's any further bleeding. She should stay in the hospital if her narcotic requirements are significant as I do not want her very minimal amount of narcotics at home.

## 2011-12-29 NOTE — Progress Notes (Signed)
Subjective: Pain in face and nose due to nasal packing persists, but is somewhat better controlled at present. She reports some weakness and light-headedness when ambulating.  She denies chest pain, n/v, or abdom pain.   Objective: Blood pressure 118/52, pulse 67, temperature 98.5 F (36.9 C), temperature source Oral, resp. rate 24, height 5\' 7"  (1.702 m), weight 73 kg (160 lb 15 oz), SpO2 99.00%. Weight change: 0.1 kg (3.5 oz)  Intake/Output Summary (Last 24 hours) at 12/29/11 1747 Last data filed at 12/29/11 1700  Gross per 24 hour  Intake    960 ml  Output   2275 ml  Net  -1315 ml    Physical Exam: General appearance: alert and cooperative Nose: packing in place B nares - no evidence of acute bleeding Lungs: clear to auscultation bilaterally w/o wheeze Heart: regular rate - no murmur, click, rub or gallop  Abdomen: soft, non-tender; bowel sounds normal; no masses,  no organomegaly Extremities: extremities normal, atraumatic, no cyanosis or edema B  Lab Results:  [facility] 12/28/11 0959 12/28/11 0311  NA 141 141  K 3.3* 2.5*  CL 100 100  CO2 31 30  GLUCOSE 137* 153*  BUN 16 23  CREATININE 0.54 0.58  CALCIUM 9.3 9.5  MG -- --  PHOS -- --    Basename 12/29/11 0410 12/28/11 1650 12/27/11 1554  WBC 5.9 5.7 --  NEUTROABS -- -- 3.4  HGB 10.2* 9.6* --  HCT 32.6* 30.3* --  MCV 95.9 94.7 --  PLT 106* 105* --   Assessment/Plan:  epistaxis currently controlled with packing - now on keflex as per ENT reccs while packing in place - advised the packing will be in for another 4-5 days after which she will f/u with ENT to have it removed and to further evaluate for the source of her nose bleeds  Hypokalemia Due to poor intake - replace and follow   Atrial fibrillation Rate controlled - no anticoag for now due to signif bleeding - resume anticoag when cleared by ENT  Coagulopathy, acquired due to Coumadin Coumadin and ASA on hold for now. INR has been reversed completley.  Will allow ENT to further advise on when to resume meds.   Anemia due to blood loss, acute Stable today - no evidence of further blood loss  Right heart failure Well compensated at present    HYPERTENSION Well controlled at present   DISSECTING AORTIC ANEURYSM THORACIC- s/p repair  TRANSIENT ISCHEMIC ATTACKS, HX OF  HYPERCHOLESTEROLEMIA Resume home med ([facility])  CAD s/p CABG  Peripheral neuropathy  Dispo  Home in AM if hgb stable and pain controlled   LOS: 2 days  12/29/2011, 5:47 PM  Lonia Blood, MD [facility] Office  [phone] Pager [phone]  On-Call/Text Page:      [email or web address]      password [facility]

## 2011-12-30 LAB — CBC
Hemoglobin: 10.9 g/dL — ABNORMAL LOW (ref 12.0–15.0)
Platelets: 121 10*3/uL — ABNORMAL LOW (ref 150–400)
RBC: 3.62 MIL/uL — ABNORMAL LOW (ref 3.87–5.11)
WBC: 7.5 10*3/uL (ref 4.0–10.5)

## 2011-12-30 LAB — BASIC METABOLIC PANEL
CO2: 29 mEq/L (ref 19–32)
Calcium: 9.5 mg/dL (ref 8.4–10.5)
Chloride: 102 mEq/L (ref 96–112)
Glucose, Bld: 131 mg/dL — ABNORMAL HIGH (ref 70–99)
Potassium: 3.8 mEq/L (ref 3.5–5.1)
Sodium: 138 mEq/L (ref 135–145)

## 2011-12-30 NOTE — Progress Notes (Addendum)
Patient ID: [name], female   DOB: 1934-12-14, 77 y.o.   MRN: [id number] Complaining of post nasal bloody drainage continuously for the past day or two. No anterior bleeding.   Packing in place, with some dried blood anteriorly. Oropharynx completely clear, no blood seen.  Reassurance provided - I asked her to spit all material into a cup and I will check it again in a few hours to see if there is any significant bleeding.   5:30 pm She seems to be doing much better. She is more alert and awake. She showed me the cup into which she has been spitting and there is mainly brownish thick mucoid material with a tiny amount of old appearing blood. There is no fresh blood.   We discussed when to remove the packing. Saturday would be five days. If she is doing well tomorrow, she may go home and follow up in our office on Monday to have the packing removed. She may call us over the weekend if there is any further bleeding.

## 2011-12-30 NOTE — Progress Notes (Signed)
Report called to Harriett Sine, RN [phone]. Pt transferred via WC to [id number] with Theadora Rama, NT. Carolin Coy, RN, BSN.

## 2011-12-30 NOTE — Progress Notes (Addendum)
Subjective: The patient is currently asleep. Per her daughter, she continues to cough up blood. There are a large amount of tissues at the bedside soaked with reddish/maroon blood.    Objective: Blood pressure 107/52, pulse 72, temperature 98.1 F (36.7 C), temperature source Oral, resp. rate 14, height 5\' 7"  (1.702 m), weight 73 kg (160 lb 15 oz), SpO2 95.00%. Weight change:   Intake/Output Summary (Last 24 hours) at 12/30/11 1052 Last data filed at 12/30/11 1000  Gross per 24 hour  Intake   1683 ml  Output   1250 ml  Net    433 ml    Physical Exam: General appearance: alert and cooperative Nose: packing in place B nares - no evidence of acute bleeding Lungs: clear to auscultation bilaterally w/o wheeze Heart: regular rate - no murmur, click, rub or gallop  Abdomen: soft, non-tender; bowel sounds normal; no masses,  no organomegaly Extremities: extremities normal, atraumatic, no cyanosis or edema B  Lab Results:  [facility] 12/30/11 0426 12/28/11 0959  NA 138 141  K 3.8 3.3*  CL 102 100  CO2 29 31  GLUCOSE 131* 137*  BUN 16 16  CREATININE 0.64 0.54  CALCIUM 9.5 9.3  MG -- --  PHOS -- --    Basename 12/30/11 0426 12/29/11 0410 12/27/11 1554  WBC 7.5 5.9 --  NEUTROABS -- -- 3.4  HGB 10.9* 10.2* --  HCT 34.8* 32.6* --  MCV 96.1 95.9 --  PLT 121* 106* --   Assessment/Plan:  epistaxis Large amount of blood loss- currently controlled with packing - now on keflex as per ENT reccs while packing in place - Due to the fact that she continues to ooze from her nasopharynx, I have contacted ENT and spoken with Dr Caron Presume (Dr Jearld Fenton if off). He will be returning to the hospital to evaluate her today. We will not discharge her today as bleeding has not stopped yet.   Hypokalemia  replaced  Atrial fibrillation Rate controlled - no anticoag for now due to signif bleeding - resume anticoag when cleared by ENT  Coagulopathy, acquired due to Coumadin Coumadin and ASA on hold for  now. INR has been reversed completley. Will allow ENT to further advise on when to resume meds.   Anemia due to blood loss, acute Stable today   Thrombocytopenia Likely related to acute blood loss- it is improving. We will cont to follow.   Right heart failure Well compensated at present    HYPERTENSION BP low normal- holding parameter on antihypertensives.    DISSECTING AORTIC ANEURYSM THORACIC- s/p repair  TRANSIENT ISCHEMIC ATTACKS, HX OF  HYPERCHOLESTEROLEMIA Resume home med (crestor)  CAD s/p CABG  Peripheral neuropathy  Dispo  Transfer to regular floor and cont to watch- Dr Caron Presume has stated that he may want to watch her in the hospital for 24-48 hrs. He will decide if he can delay removal of the packing until Saturday.    LOS: 3 days  12/30/2011, 10:52 AM  Calvert Cantor, MD [facility] Office  [phone] Pager [phone]  On-Call/Text Page:      [email or web address]      password [facility]

## 2011-12-31 LAB — CBC
HCT: 34.2 % — ABNORMAL LOW (ref 36.0–46.0)
Hemoglobin: 10.6 g/dL — ABNORMAL LOW (ref 12.0–15.0)
MCH: 29.8 pg (ref 26.0–34.0)
MCV: 96.1 fL (ref 78.0–100.0)
Platelets: 149 10*3/uL — ABNORMAL LOW (ref 150–400)
RBC: 3.56 MIL/uL — ABNORMAL LOW (ref 3.87–5.11)
WBC: 7.5 10*3/uL (ref 4.0–10.5)

## 2011-12-31 LAB — URINALYSIS, ROUTINE W REFLEX MICROSCOPIC
Bilirubin Urine: NEGATIVE
Glucose, UA: NEGATIVE mg/dL
Nitrite: NEGATIVE
Specific Gravity, Urine: 1.009 (ref 1.005–1.030)
pH: 6 (ref 5.0–8.0)

## 2011-12-31 LAB — TISSUE CULTURE: Gram Stain: NONE SEEN

## 2011-12-31 MED ORDER — DSS 100 MG PO CAPS: 100.0000 mg | ORAL_CAPSULE | Freq: Two times a day (BID) | ORAL | Status: AC

## 2011-12-31 MED ORDER — CEPHALEXIN 500 MG PO CAPS: 500.0000 mg | ORAL_CAPSULE | Freq: Two times a day (BID) | ORAL | Status: AC

## 2011-12-31 MED ORDER — HYDROCODONE-ACETAMINOPHEN 5-500 MG PO TABS: 1.0000 | ORAL_TABLET | ORAL | Status: AC | PRN

## 2011-12-31 MED ORDER — TRAMADOL HCL 50 MG PO TABS: 50.0000 mg | ORAL_TABLET | Freq: Three times a day (TID) | ORAL | Status: DC | PRN

## 2011-12-31 NOTE — Discharge Summary (Signed)
Physician Discharge Summary  Patient ID: BRIHANY BUTCH MRN: [id number] DOB/AGE: 75-Nov-1936 75 y.o.  Admit date: 12/27/2011 Discharge date: 12/31/2011  Primary Care Physician:  Michele Mcalpine, MD, MD   Discharge Diagnoses:   .Arterial epistaxis .Anemia due to blood loss, acute .Aneurysm .DISSECTING AORTIC ANEURYSM THORACIC .Edema .TRANSIENT ISCHEMIC ATTACKS, HX OF .HYPERCHOLESTEROLEMIA .HYPERTENSION .Atrial fibrillation  Medication List  As of 12/31/2011  3:11 PM   STOP taking these medications         warfarin 5 MG tablet         TAKE these medications         beta carotene w/minerals tablet   Take 1 tablet by mouth 2 (two) times daily.      cephALEXin 500 MG capsule   Commonly known as: KEFLEX   Take 1 capsule (500 mg total) by mouth 2 (two) times daily.      clorazepate 7.5 MG tablet   Commonly known as: TRANXENE   Take 7.5 mg by mouth 3 (three) times daily as needed. For nerves.      DSS 100 MG Caps   Take 100 mg by mouth 2 (two) times daily.      furosemide 80 MG tablet   Commonly known as: LASIX   Take 80 mg by mouth 2 (two) times daily.      gabapentin 300 MG capsule   Commonly known as: NEURONTIN   Take 600 mg by mouth at bedtime.      HYDROcodone-acetaminophen 5-500 MG per tablet   Commonly known as: VICODIN   Take 1-2 tablets by mouth every 4 (four) hours as needed for pain.      metolazone 2.5 MG tablet   Commonly known as: ZAROXOLYN   Take 2.5 mg by mouth See admin instructions. Take One Tablet By Mouth 30 Minutes Prior To Furosemide Dose Every Monday and Thursday morning      metoprolol 50 MG tablet   Commonly known as: LOPRESSOR   Take 50 mg by mouth 2 (two) times daily.      omeprazole 20 MG capsule   Commonly known as: PRILOSEC   Take 20 mg by mouth 2 (two) times daily.      potassium chloride SA 20 MEQ tablet   Commonly known as: K-DUR,KLOR-CON   Take 40-60 mEq by mouth 2 (two) times daily. Take 2 tablets by mouth two times a day  and take an extra potassium on Monday and Thursday with Metolazone      RHINOCORT AQUA 32 MCG/ACT nasal spray   Generic drug: budesonide   Place 1 spray into the nose at bedtime.      rosuvastatin 5 MG tablet   Commonly known as: CRESTOR   Take 5 mg by mouth every other day.      sertraline 50 MG tablet   Commonly known as: ZOLOFT   Take 50 mg by mouth every morning.      traMADol 50 MG tablet   Commonly known as: ULTRAM   Take 1 tablet (50 mg total) by mouth every 8 (eight) hours as needed. For pain      Vitamin D 1000 UNITS capsule   Take 1,000 Units by mouth daily.      zolpidem 10 MG tablet   Commonly known as: AMBIEN   Take 10 mg by mouth at bedtime as needed. For sleep.             Disposition and Follow-up:  Discharge in stable condition and w/o  signs of acute bleeding currently. Hgb stable. Patient will follow discharge recommendations and medications as prescribed. Will follow with PCP in 2 weeks and with Dr Pollyann Kennedy ENT early next week (3 days approx).  Consults:   ENT   Significant Diagnostic Studies:  No results found.   Brief H and P: 77 year old woman with known coronary disease, atrial fibrillation, and history of aortic dissection, presented to the emergency room today with epistaxis. This is brisk and arterial in nature. The emergency room team as well as the ear nose and throat Md on call Dr. Emeline Darling, have been unable to control the bleeding. The patient is currently spitting clots every 30 seconds.   Hospital Course:  1-Arterial epistaxis: now controlled after packing by ENT. Will continue holding ASA and coumadin for now; patient will follow with Dr. Pollyann Kennedy on Monday and will continue keflex.   2-HYPERCHOLESTEROLEMIA: continue statins  3-HYPERTENSION: well controlled; continue current regimen.  4-Atrial fibrillation: rate controlled; will hold anticoagulation for now due to acute bleeding.  5-Hx of DISSECTING AORTIC ANEURYSM THORACIC: no signs of  acute issues related with this problem.  6-TRANSIENT ISCHEMIC ATTACKS, HX OF: plan is to resume ASA and coumadin once clear for that by ENT. No acute neurologic changes or focal deficit. Continue controlling risk fx's with current medication regimen.  7-Anemia due to blood loss, acute: Stable; at discharge 10.6; no actively bleeding now; no transfusion; CBC during PCP follow up appointment.  8-depression and anxiety: Continue sertraline and tranxene as needed.  The rest of her medical problems remains stable and no changes has been made to her medication regimen.      Time spent on Discharge: 40 minutes  Signed: Lillyen Schow 12/31/2011, 3:11 PM

## 2011-12-31 NOTE — Progress Notes (Signed)
Pt is to dc to home today and has no other dc needs at this time. Willa Rough [phone] OR [phone] 12/31/2011

## 2011-12-31 NOTE — Progress Notes (Signed)
AVS reviewed with patient and daughter at bedside. Pt and daughter able to verbalize understanding and have no questions. Pt remains stable; nasal packing intact and will be removed outpatient. Pt assisted out of facility in wheelchair. [place], Rosanna Randy

## 2012-01-04 ENCOUNTER — Telehealth: Payer: Self-pay | Admitting: Cardiovascular Disease

## 2012-01-04 NOTE — Telephone Encounter (Signed)
I spoke with the pt and she saw Dr Annalee Genta yesterday and had nasal packing removed.  The pt is scheduled to see Dr Annalee Genta again on 01/13/12.  Per Dr Excell Seltzer the pt should remain off of Coumadin at this time.  Per Dr Earmon Phoenix recommendation he would like Dr Annalee Genta to decide if the pt is okay to resume coumadin at 01/13/12 appointment.  I made the pt aware that she needs to contact the coumadin clinic if she does resume coumadin.  Pt agreed with plan.

## 2012-01-04 NOTE — Telephone Encounter (Signed)
New msg Pt wants to talk to you when she should see Dr Excell Seltzer again.

## 2012-01-13 ENCOUNTER — Encounter (HOSPITAL_COMMUNITY): Payer: Self-pay | Admitting: Anesthesiology

## 2012-01-13 ENCOUNTER — Emergency Department (HOSPITAL_COMMUNITY): Payer: Medicare Other

## 2012-01-13 ENCOUNTER — Inpatient Hospital Stay (HOSPITAL_COMMUNITY)
Admission: EM | Admit: 2012-01-13 | Discharge: 2012-01-14 | DRG: 151 | Disposition: A | Discharge status: Other Acute Inpatient Hospital | Payer: Medicare Other | Attending: Otolaryngology | Admitting: Otolaryngology

## 2012-01-13 ENCOUNTER — Emergency Department (HOSPITAL_COMMUNITY): Payer: Medicare Other | Admitting: Anesthesiology

## 2012-01-13 ENCOUNTER — Encounter (HOSPITAL_COMMUNITY): Payer: Self-pay

## 2012-01-13 DIAGNOSIS — I2510 Atherosclerotic heart disease of native coronary artery without angina pectoris: Secondary | ICD-10-CM | POA: Diagnosis present

## 2012-01-13 DIAGNOSIS — G4733 Obstructive sleep apnea (adult) (pediatric): Secondary | ICD-10-CM | POA: Diagnosis present

## 2012-01-13 DIAGNOSIS — Z954 Presence of other heart-valve replacement: Secondary | ICD-10-CM

## 2012-01-13 DIAGNOSIS — Z8673 Personal history of transient ischemic attack (TIA), and cerebral infarction without residual deficits: Secondary | ICD-10-CM

## 2012-01-13 DIAGNOSIS — K589 Irritable bowel syndrome without diarrhea: Secondary | ICD-10-CM | POA: Diagnosis present

## 2012-01-13 DIAGNOSIS — Z951 Presence of aortocoronary bypass graft: Secondary | ICD-10-CM

## 2012-01-13 DIAGNOSIS — K219 Gastro-esophageal reflux disease without esophagitis: Secondary | ICD-10-CM | POA: Diagnosis present

## 2012-01-13 DIAGNOSIS — I4891 Unspecified atrial fibrillation: Secondary | ICD-10-CM | POA: Diagnosis present

## 2012-01-13 DIAGNOSIS — Z888 Allergy status to other drugs, medicaments and biological substances status: Secondary | ICD-10-CM

## 2012-01-13 DIAGNOSIS — R040 Epistaxis: Principal | ICD-10-CM | POA: Diagnosis present

## 2012-01-13 DIAGNOSIS — Z90710 Acquired absence of both cervix and uterus: Secondary | ICD-10-CM

## 2012-01-13 DIAGNOSIS — Z9079 Acquired absence of other genital organ(s): Secondary | ICD-10-CM

## 2012-01-13 DIAGNOSIS — I10 Essential (primary) hypertension: Secondary | ICD-10-CM | POA: Diagnosis present

## 2012-01-13 DIAGNOSIS — I739 Peripheral vascular disease, unspecified: Secondary | ICD-10-CM | POA: Diagnosis present

## 2012-01-13 DIAGNOSIS — Z538 Procedure and treatment not carried out for other reasons: Secondary | ICD-10-CM

## 2012-01-13 DIAGNOSIS — Z886 Allergy status to analgesic agent status: Secondary | ICD-10-CM

## 2012-01-13 LAB — POCT I-STAT, CHEM 8
BUN: 21 mg/dL (ref 6–23)
Chloride: 99 mEq/L (ref 96–112)
HCT: 31 % — ABNORMAL LOW (ref 36.0–46.0)
Potassium: 3.7 mEq/L (ref 3.5–5.1)

## 2012-01-13 LAB — PROTIME-INR
INR: 1.18 (ref 0.00–1.49)
Prothrombin Time: 15.3 seconds — ABNORMAL HIGH (ref 11.6–15.2)

## 2012-01-13 LAB — CBC
HCT: 24.3 % — ABNORMAL LOW (ref 36.0–46.0)
Hemoglobin: 7.7 g/dL — ABNORMAL LOW (ref 12.0–15.0)
MCH: 29.5 pg (ref 26.0–34.0)
MCV: 93.1 fL (ref 78.0–100.0)
RBC: 2.61 MIL/uL — ABNORMAL LOW (ref 3.87–5.11)

## 2012-01-13 MED ORDER — SODIUM CHLORIDE 0.9 % IV SOLN: INTRAVENOUS | Status: AC

## 2012-01-13 MED ORDER — SUFENTANIL CITRATE 50 MCG/ML IV SOLN
INTRAVENOUS | Status: DC | PRN
  Administered 2012-01-13: 5 ug via INTRAVENOUS
  Administered 2012-01-13: 20 ug via INTRAVENOUS

## 2012-01-13 MED ORDER — PROPOFOL 10 MG/ML IV EMUL
INTRAVENOUS | Status: DC | PRN
  Administered 2012-01-13: 80 mg via INTRAVENOUS

## 2012-01-13 MED ORDER — KETOROLAC TROMETHAMINE 30 MG/ML IJ SOLN
15.0000 mg | Freq: Four times a day (QID) | INTRAMUSCULAR | Status: DC | PRN
  Administered 2012-01-13 – 2012-01-14 (×3): 30 mg via INTRAVENOUS
  Filled 2012-01-13 (×2): qty 1

## 2012-01-13 MED ORDER — LORAZEPAM 2 MG/ML IJ SOLN
INTRAMUSCULAR | Status: AC
  Administered 2012-01-13: 0.5 mg via INTRAVENOUS
  Filled 2012-01-13: qty 1

## 2012-01-13 MED ORDER — FENTANYL CITRATE 0.05 MG/ML IJ SOLN
25.0000 ug | INTRAMUSCULAR | Status: DC | PRN
  Administered 2012-01-13 (×2): 25 ug via INTRAVENOUS

## 2012-01-13 MED ORDER — KETOROLAC TROMETHAMINE 15 MG/ML IJ SOLN: 15.0000 mg | Freq: Four times a day (QID) | INTRAMUSCULAR | Status: DC | PRN

## 2012-01-13 MED ORDER — SUCCINYLCHOLINE CHLORIDE 20 MG/ML IJ SOLN
INTRAMUSCULAR | Status: DC | PRN
  Administered 2012-01-13: 100 mg via INTRAVENOUS

## 2012-01-13 MED ORDER — CEPHALEXIN 250 MG PO CAPS
500.0000 mg | ORAL_CAPSULE | Freq: Three times a day (TID) | ORAL | Status: DC
  Administered 2012-01-13 – 2012-01-14 (×3): 500 mg via ORAL
  Filled 2012-01-13 (×8): qty 2

## 2012-01-13 MED ORDER — LORAZEPAM 2 MG/ML IJ SOLN
0.5000 mg | Freq: Once | INTRAMUSCULAR | Status: AC
  Administered 2012-01-13: 0.5 mg via INTRAVENOUS

## 2012-01-13 MED ORDER — ZOLPIDEM TARTRATE 5 MG PO TABS
5.0000 mg | ORAL_TABLET | Freq: Every evening | ORAL | Status: DC | PRN
  Administered 2012-01-13: 5 mg via ORAL
  Filled 2012-01-13: qty 1

## 2012-01-13 MED ORDER — GLYCOPYRROLATE 0.2 MG/ML IJ SOLN
INTRAMUSCULAR | Status: DC | PRN
  Administered 2012-01-13: .5 mg via INTRAVENOUS

## 2012-01-13 MED ORDER — KETOROLAC TROMETHAMINE 30 MG/ML IJ SOLN
INTRAMUSCULAR | Status: AC
  Administered 2012-01-13: 30 mg via INTRAVENOUS
  Filled 2012-01-13: qty 1

## 2012-01-13 MED ORDER — HEPARIN SODIUM (PORCINE) 1000 UNIT/ML IJ SOLN
INTRAMUSCULAR | Status: DC | PRN
  Administered 2012-01-13 (×2): 1000 [IU] via INTRAVENOUS

## 2012-01-13 MED ORDER — ROCURONIUM BROMIDE 100 MG/10ML IV SOLN
INTRAVENOUS | Status: DC | PRN
  Administered 2012-01-13: 50 mg via INTRAVENOUS

## 2012-01-13 MED ORDER — ONDANSETRON HCL 4 MG/2ML IJ SOLN
INTRAMUSCULAR | Status: DC | PRN
  Administered 2012-01-13: 4 mg via INTRAVENOUS

## 2012-01-13 MED ORDER — CEFAZOLIN SODIUM 1-5 GM-% IV SOLN
INTRAVENOUS | Status: DC | PRN
  Administered 2012-01-13: 1 g via INTRAVENOUS

## 2012-01-13 MED ORDER — IOHEXOL 300 MG/ML  SOLN: 150.0000 mL | Freq: Once | INTRAMUSCULAR | Status: AC | PRN

## 2012-01-13 MED ORDER — ACETAMINOPHEN 650 MG RE SUPP: 650.0000 mg | Freq: Four times a day (QID) | RECTAL | Status: DC | PRN

## 2012-01-13 MED ORDER — PHENYLEPHRINE HCL 10 MG/ML IJ SOLN
10.0000 mg | INTRAVENOUS | Status: DC | PRN
  Administered 2012-01-13: 10 ug/min via INTRAVENOUS

## 2012-01-13 MED ORDER — FENTANYL CITRATE 0.05 MG/ML IJ SOLN
50.0000 ug | Freq: Once | INTRAMUSCULAR | Status: AC
  Administered 2012-01-13: 50 ug via INTRAVENOUS
  Filled 2012-01-13: qty 2

## 2012-01-13 MED ORDER — METOPROLOL TARTRATE 1 MG/ML IV SOLN
INTRAVENOUS | Status: DC | PRN
  Administered 2012-01-13: 5 mg via INTRAVENOUS

## 2012-01-13 MED ORDER — LIDOCAINE HCL (CARDIAC) 20 MG/ML IV SOLN
INTRAVENOUS | Status: DC | PRN
  Administered 2012-01-13: 30 mg via INTRAVENOUS

## 2012-01-13 MED ORDER — SODIUM CHLORIDE 0.9 % IV SOLN
INTRAVENOUS | Status: DC | PRN
  Administered 2012-01-13: 13:00:00 via INTRAVENOUS

## 2012-01-13 MED ORDER — ACETAMINOPHEN 325 MG PO TABS
650.0000 mg | ORAL_TABLET | Freq: Four times a day (QID) | ORAL | Status: DC | PRN
  Administered 2012-01-13 – 2012-01-14 (×2): 650 mg via ORAL
  Filled 2012-01-13 (×2): qty 2

## 2012-01-13 MED ORDER — NEOSTIGMINE METHYLSULFATE 1 MG/ML IJ SOLN
INTRAMUSCULAR | Status: DC | PRN
  Administered 2012-01-13: 3 mg via INTRAVENOUS

## 2012-01-13 MED ORDER — KCL IN DEXTROSE-NACL 20-5-0.45 MEQ/L-%-% IV SOLN
INTRAVENOUS | Status: DC
  Administered 2012-01-13 – 2012-01-14 (×2): via INTRAVENOUS
  Filled 2012-01-13 (×3): qty 1000

## 2012-01-13 MED ORDER — WHITE PETROLATUM GEL
Status: AC
  Filled 2012-01-13: qty 5

## 2012-01-13 MED ORDER — SODIUM CHLORIDE 0.9 % IJ SOLN
1.5000 mg | INTRAVENOUS | Status: AC
  Filled 2012-01-13: qty 0.3

## 2012-01-13 MED ORDER — PROTAMINE SULFATE 10 MG/ML IV SOLN
INTRAVENOUS | Status: DC | PRN
  Administered 2012-01-13: 10 mg via INTRAVENOUS

## 2012-01-13 NOTE — ED Notes (Signed)
MD at bedside. Family at bedside

## 2012-01-13 NOTE — Procedures (Signed)
S/P innominate and lt ccarteriograms. RT CFA approach. Preliminary findings  Severely ectatic aortic arch with unfolding  Steep origins of LT CCA and RT CCA associated with sequela of previous dissection.

## 2012-01-13 NOTE — Anesthesia Postprocedure Evaluation (Signed)
  Anesthesia Post-op Note  Patient: [name]  Procedure(s) Performed: * No procedures listed *  Patient Location: PACU  Anesthesia Type: General  Level of Consciousness: awake  Airway and Oxygen Therapy: Patient Spontanous Breathing and Patient connected to nasal cannula oxygen  Post-op Pain: mild  Post-op Assessment: Post-op Vital signs reviewed, Patient's Cardiovascular Status Stable, Respiratory Function Stable and Patent Airway  Post-op Vital Signs: Reviewed and stable  Complications: No apparent anesthesia complications

## 2012-01-13 NOTE — H&P (Signed)
[name] is an 77 y.o. female.   Chief Complaint: Recurrent  Nose bleed. Cautery and packing but still has bleeding. Started to bleed again at approx 4am today HPI: scheduled now for cerebral arteriogram with possible embolization if bleeding vessel identified  Past Medical History  Diagnosis Date  . OSA (obstructive sleep apnea)   . HTN (hypertension)   . Atrial fibrillation     paroxysmal; on coumadin  . CAD (coronary artery disease)     a. cath 7/11: LM 40%, mild plaque disease in CFX, LAD, and RCA;  b. CABG with S-LAD and S-OM done at time of Aortic dissection repair  . Diastolic heart failure   . Dissection of aorta, thoracic     a. Type A; s/p repair 7/11 with aortic root repair and CABG x 2   . ASD (atrial septal defect)     a. s/p repair [facility]  . Right heart failure     a. 2/2 TR and RV dysfxn;  b. echo 4/12: EF 60%, mild LVH, mild AI, mild MR, mod LAE, mod RVE with mod dec. RVSF, mod RAE, mod to severe TR, PASP 58;  c. right heart cath 4/12:  RA mean 8, RV 46/1 with mean 6, PA 45/13 with mean 26, PCWP mean 14, CO 3.68, CI 2.1 (no sig pulmon HTN)  . GERD (gastroesophageal reflux disease)   . IBS (irritable bowel syndrome)   . Esophageal stricture   . Colonic polyp   . Peripheral neuropathy   . Back pain   . Fibromyalgia   . Anxiety   . HLD (hyperlipidemia)   . Borderline diabetes   . History of TIAs   . DJD (degenerative joint disease)   . Normocytic anemia   . Thrombocytopenia   . Macular degeneration   . Aneurysm 2011    Disecting aortic aneurysm  . Depression   . Atrial septal defect   . Esophageal dysmotilities     Past Surgical History  Procedure Date  . Asd repair 1982    [facility]  . Rotator cuff repair 2007    right  . Cardioversion     x 3  . Tubal ligation   . Appendectomy   . Vaginal hysterectomy 1987    A/P Repair With Cystocele and rectocele repair  . Tonsillectomy   . Oophorectomy 1994    BSO  . Pelvic laparoscopy 1994  .  Emergency redo median sternotomy for hemiarch repair of acute type a aortic  dissection 06/05/2010  . Nasal hemorrhage control 12/27/2011    Procedure: EPISTAXIS CONTROL;  Surgeon: Melvenia Beam, MD;  Location[facility];  Service: ENT;  Laterality: N/A;    Family History  Problem Relation Age of Onset  . Pancreatic cancer Sister   . Osteoporosis Sister   . Lung cancer Brother     x 2  . Heart attack Brother   . Melanoma Sister   . Diabetes Mother   . Hypertension Mother   . Colon cancer Sister   . ALS Mother   . Heart disease Brother     x 2   Social History:  reports that she has never smoked. She has never used smokeless tobacco. She reports that she does not drink alcohol or use illicit drugs.  Allergies:  Allergies  Allergen Reactions  . Atorvastatin     REACTION: muscle pain  . Codeine     REACTION: itching  . Erythromycin   . Ezetimibe  REACTION: INTOL to Zetia w/ cough  . Meperidine Hcl     REACTION: dizziness  . Morphine   . Ropinirole Hydrochloride     REACTION: INTOL to Requip w/ sleep paralysis  . Simvastatin     REACTION: unable to walk--muscle pain  . Triple Antibiotic     Medications Prior to Admission  Medication Dose Route Frequency Provider Last Rate Last Dose  . LORazepam (ATIVAN) injection 0.5 mg  0.5 mg Intravenous Once Rolan Bucco, MD   0.5 mg at 01/13/12 5621   Medications Prior to Admission  Medication Sig Dispense Refill  . beta carotene w/minerals (OCUVITE) tablet Take 1 tablet by mouth 2 (two) times daily.        . clorazepate (TRANXENE) 7.5 MG tablet Take 7.5 mg by mouth 3 (three) times daily as needed. For nerves.      . furosemide (LASIX) 80 MG tablet Take 80 mg by mouth 2 (two) times daily.      Marland Kitchen gabapentin (NEURONTIN) 300 MG capsule Take 600 mg by mouth at bedtime.      . metolazone (ZAROXOLYN) 2.5 MG tablet Take 2.5 mg by mouth See admin instructions. Take One Tablet By Mouth 30 Minutes Prior To Furosemide Dose Every Monday and  Thursday morning      . metoprolol (LOPRESSOR) 50 MG tablet Take 50 mg by mouth 2 (two) times daily.      Marland Kitchen omeprazole (PRILOSEC) 20 MG capsule Take 20 mg by mouth 2 (two) times daily.      . potassium chloride SA (K-DUR,KLOR-CON) 20 MEQ tablet Take 40-60 mEq by mouth See admin instructions. Take 2 tablets by mouth two times a day and take an extra potassium on Monday and Thursday with Metolazone      . rosuvastatin (CRESTOR) 5 MG tablet Take 5 mg by mouth every other day.      . sertraline (ZOLOFT) 50 MG tablet Take 50 mg by mouth every morning.      . traMADol (ULTRAM) 50 MG tablet Take 1 tablet (50 mg total) by mouth every 8 (eight) hours as needed. For pain  30 tablet  0  . zolpidem (AMBIEN) 10 MG tablet Take 10 mg by mouth at bedtime as needed. For sleep.        Results for orders placed during the hospital encounter of 01/13/12 (from the past 48 hour(s))  POCT I-STAT, CHEM 8     Status: Abnormal   Collection Time   01/13/12  8:26 AM      Component Value Range Comment   Sodium 141  135 - 145 (mEq/L)    Potassium 3.7  3.5 - 5.1 (mEq/L)    Chloride 99  96 - 112 (mEq/L)    BUN 21  6 - 23 (mg/dL)    Creatinine, Ser 3.08  0.50 - 1.10 (mg/dL)    Glucose, Bld 657 (*) 70 - 99 (mg/dL)    Calcium, Ion 8.46  1.12 - 1.32 (mmol/L)    TCO2 30  0 - 100 (mmol/L)    Hemoglobin 10.5 (*) 12.0 - 15.0 (g/dL)    HCT 96.2 (*) 95.2 - 46.0 (%)   PROTIME-INR     Status: Abnormal   Collection Time   01/13/12  8:34 AM      Component Value Range Comment   Prothrombin Time 15.3 (*) 11.6 - 15.2 (seconds)    INR 1.18  0.00 - 1.49     No results found.  Review of Systems  Constitutional:  Negative for fever.  Respiratory:       Recurrent nose bleed  Cardiovascular: Negative for chest pain.  Gastrointestinal: Positive for nausea. Negative for vomiting.  Neurological: Negative for headaches.    Blood pressure 113/63, pulse 95, temperature 97.9 F (36.6 C), temperature source Axillary, resp. rate 22, SpO2  100.00%. Physical Exam  Constitutional: She is oriented to person, place, and time. She appears well-developed.  Eyes: EOM are normal.  Cardiovascular: Normal rate and normal heart sounds.   No murmur heard. Respiratory: Effort normal and breath sounds normal. She has no wheezes.  GI: Soft.  Musculoskeletal: Normal range of motion.       weak  Neurological: She is alert and oriented to person, place, and time.  Skin: Skin is warm.     Assessment/Plan Recurrent nose bleed. Failed cautery and packing Re bleed this am Now scheduled for Cerebral Arteriogram with possible embolization Pt and family aware of procedure benefits and risks and agreeable to proceed. Consent signed.  Ramla Hase A 01/13/2012, 10:00 AM

## 2012-01-13 NOTE — ED Notes (Signed)
Bleeding from nose and post nasal drainage has stopped.  Head of bed remains elevated at 90 degrees.  Suction is at bedside.  Stimuli is minimized.  Pt requesting pain medication and "something to help me relax".

## 2012-01-13 NOTE — Progress Notes (Signed)
Patient ID: [name], female   DOB: 1935/01/18, 77 y.o.   MRN: [id number] Patient had attempted arteriogram today for potential embolization but procedure was unsuccessful due to problems encountered regarding her aortic replacement and associated artery wall dissection potential.  Thus, the procedure was aborted.  She is in ICU now recovering.  The right pack remains in the nose with no active bleeding.  She complains of facial pain only.  I discussed situation at length with the patient and family and recommended proceeding with transfer to an academic center for further management of her uncontrolled epistaxis.  The family agrees with the plan and would prefer [facility].  Transfer will be arranged.

## 2012-01-13 NOTE — ED Notes (Signed)
Patient repositioned in bed.

## 2012-01-13 NOTE — H&P (Signed)
[name] is an 77 y.o. female.   Chief Complaint: Epistaxis HPI: 77 year old female previously taking Coumadin for atrial fibrillation and aortic replacement who developed severe left epistaxis on 2/11 requiring surgical management.  Bleeding was found in both nasal passages at surgery and cautery was performed bilaterally.  Still, bleeding was not controlled so both nasal passages were packed for several days.  Since packing removal, she has has a few limited episodes of bright red bleeding but started bleeding heavily again this morning more from the right side.  She came to the ER where the ER staff placed a rapid rhino pack and bleeding has settled.  Past Medical History  Diagnosis Date  . OSA (obstructive sleep apnea)   . HTN (hypertension)   . Atrial fibrillation     paroxysmal; on coumadin  . CAD (coronary artery disease)     a. cath 7/11: LM 40%, mild plaque disease in CFX, LAD, and RCA;  b. CABG with S-LAD and S-OM done at time of Aortic dissection repair  . Diastolic heart failure   . Dissection of aorta, thoracic     a. Type A; s/p repair 7/11 with aortic root repair and CABG x 2   . ASD (atrial septal defect)     a. s/p repair 1982 at [facility]  . Right heart failure     a. 2/2 TR and RV dysfxn;  b. echo 4/12: EF 60%, mild LVH, mild AI, mild MR, mod LAE, mod RVE with mod dec. RVSF, mod RAE, mod to severe TR, PASP 58;  c. right heart cath 4/12:  RA mean 8, RV 46/1 with mean 6, PA 45/13 with mean 26, PCWP mean 14, CO 3.68, CI 2.1 (no sig pulmon HTN)  . GERD (gastroesophageal reflux disease)   . IBS (irritable bowel syndrome)   . Esophageal stricture   . Colonic polyp   . Peripheral neuropathy   . Back pain   . Fibromyalgia   . Anxiety   . HLD (hyperlipidemia)   . Borderline diabetes   . History of TIAs   . DJD (degenerative joint disease)   . Normocytic anemia   . Thrombocytopenia   . Macular degeneration   . Aneurysm 2011    Disecting aortic aneurysm  . Depression     . Atrial septal defect   . Esophageal dysmotilities     Past Surgical History  Procedure Date  . Asd repair 1982    [facility]  . Rotator cuff repair 2007    right  . Cardioversion     x 3  . Tubal ligation   . Appendectomy   . Vaginal hysterectomy 1987    A/P Repair With Cystocele and rectocele repair  . Tonsillectomy   . Oophorectomy 1994    BSO  . Pelvic laparoscopy 1994  . Emergency redo median sternotomy for hemiarch repair of acute type a aortic  dissection 06/05/2010  . Nasal hemorrhage control 12/27/2011    Procedure: EPISTAXIS CONTROL;  Surgeon: Melvenia Beam, MD;  Location: [facility] OR;  Service: ENT;  Laterality: N/A;    Family History  Problem Relation Age of Onset  . Pancreatic cancer Sister   . Osteoporosis Sister   . Lung cancer Brother     x 2  . Heart attack Brother   . Melanoma Sister   . Diabetes Mother   . Hypertension Mother   . Colon cancer Sister   . ALS Mother   . Heart disease Brother  x 2   Social History:  reports that she has never smoked. She has never used smokeless tobacco. She reports that she does not drink alcohol or use illicit drugs.  Allergies:  Allergies  Allergen Reactions  . Atorvastatin     REACTION: muscle pain  . Codeine     REACTION: itching  . Erythromycin   . Ezetimibe     REACTION: INTOL to Zetia w/ cough  . Meperidine Hcl     REACTION: dizziness  . Morphine   . Ropinirole Hydrochloride     REACTION: INTOL to Requip w/ sleep paralysis  . Simvastatin     REACTION: unable to walk--muscle pain  . Triple Antibiotic     Medications Prior to Admission  Medication Dose Route Frequency Provider Last Rate Last Dose  . LORazepam (ATIVAN) injection 0.5 mg  0.5 mg Intravenous Once Rolan Bucco, MD   0.5 mg at 01/13/12 3086   Medications Prior to Admission  Medication Sig Dispense Refill  . beta carotene w/minerals (OCUVITE) tablet Take 1 tablet by mouth 2 (two) times daily.        . clorazepate (TRANXENE) 7.5 MG tablet  Take 7.5 mg by mouth 3 (three) times daily as needed. For nerves.      . furosemide (LASIX) 80 MG tablet Take 80 mg by mouth 2 (two) times daily.      Marland Kitchen gabapentin (NEURONTIN) 300 MG capsule Take 600 mg by mouth at bedtime.      . metolazone (ZAROXOLYN) 2.5 MG tablet Take 2.5 mg by mouth See admin instructions. Take One Tablet By Mouth 30 Minutes Prior To Furosemide Dose Every Monday and Thursday morning      . metoprolol (LOPRESSOR) 50 MG tablet Take 50 mg by mouth 2 (two) times daily.      Marland Kitchen omeprazole (PRILOSEC) 20 MG capsule Take 20 mg by mouth 2 (two) times daily.      . potassium chloride SA (K-DUR,KLOR-CON) 20 MEQ tablet Take 40-60 mEq by mouth See admin instructions. Take 2 tablets by mouth two times a day and take an extra potassium on Monday and Thursday with Metolazone      . rosuvastatin (CRESTOR) 5 MG tablet Take 5 mg by mouth every other day.      . sertraline (ZOLOFT) 50 MG tablet Take 50 mg by mouth every morning.      . traMADol (ULTRAM) 50 MG tablet Take 1 tablet (50 mg total) by mouth every 8 (eight) hours as needed. For pain  30 tablet  0  . zolpidem (AMBIEN) 10 MG tablet Take 10 mg by mouth at bedtime as needed. For sleep.        Results for orders placed during the hospital encounter of 01/13/12 (from the past 48 hour(s))  POCT I-STAT, CHEM 8     Status: Abnormal   Collection Time   01/13/12  8:26 AM      Component Value Range Comment   Sodium 141  135 - 145 (mEq/L)    Potassium 3.7  3.5 - 5.1 (mEq/L)    Chloride 99  96 - 112 (mEq/L)    BUN 21  6 - 23 (mg/dL)    Creatinine, Ser 5.78  0.50 - 1.10 (mg/dL)    Glucose, Bld 469 (*) 70 - 99 (mg/dL)    Calcium, Ion 6.29  1.12 - 1.32 (mmol/L)    TCO2 30  0 - 100 (mmol/L)    Hemoglobin 10.5 (*) 12.0 - 15.0 (g/dL)  HCT 31.0 (*) 36.0 - 46.0 (%)   PROTIME-INR     Status: Abnormal   Collection Time   01/13/12  8:34 AM      Component Value Range Comment   Prothrombin Time 15.3 (*) 11.6 - 15.2 (seconds)    INR 1.18  0.00 -  1.49     No results found.  Review of Systems  All other systems reviewed and are negative.    Blood pressure 100/59, pulse 91, temperature 97.9 F (36.6 C), temperature source Axillary, resp. rate 22, SpO2 100.00%. Physical Exam  Constitutional: She is oriented to person, place, and time. She appears well-developed and well-nourished. No distress.  HENT:  Head: Normocephalic and atraumatic.  Right Ear: External ear normal.  Left Ear: External ear normal.  Mouth/Throat: Oropharynx is clear and moist.       Right nasal passage packed with rapid rhino.  No active nose bleeding.  Eyes: Conjunctivae and EOM are normal. Pupils are equal, round, and reactive to light.  Neck: Normal range of motion. Neck supple.  Cardiovascular: Normal rate.   Respiratory: Effort normal.  GI:       Did not examine.  Genitourinary:       Did not examine.  Musculoskeletal: Normal range of motion.  Neurological: She is alert and oriented to person, place, and time. No cranial nerve deficit.  Skin: Skin is warm and dry.  Psychiatric: She has a normal mood and affect. Her behavior is normal. Judgment and thought content normal.     Assessment/Plan Epistaxis Currently, bleeding controlled by packing placed by ER staff.  I discussed her case with Dr. Eustace Quail, interventional radiology, who has agreed to see the patient and consider embolization.  I discussed this approach with the patient and she agrees to proceeding.  She will be admitted to the hospital after the procedure for observation overnight.  Haru Shaff 01/13/2012, 10:19 AM

## 2012-01-13 NOTE — ED Provider Notes (Signed)
History     CSN: [id number]  Arrival date & time 01/13/12  0740   First MD Initiated Contact with Patient 01/13/12 [phone]      Chief Complaint  Patient presents with  . Epistaxis    (Consider location/radiation/quality/duration/timing/severity/associated sxs/prior treatment) HPI Comments: Patient presents dimension, with epistaxis that started about 5:30 this morning. She was previously on Coumadin, but hasn't been on it for about 2 she about one week ago for a nosebleed. Packing is attentive emergency department was unsuccessful. ENT was conchal fold and she was taken to the OR for cauterization of the sphenopalatine artery. This was also passed and then packing was removed yesterday. She denies a dizziness, headache, chest pain or shortness of breath. She is very anxious  Patient is a 77 y.o. female presenting with nosebleeds. The history is provided by the patient and a relative.  Epistaxis     Past Medical History  Diagnosis Date  . OSA (obstructive sleep apnea)   . HTN (hypertension)   . Atrial fibrillation     paroxysmal; on coumadin  . CAD (coronary artery disease)     a. cath 7/11: LM 40%, mild plaque disease in CFX, LAD, and RCA;  b. CABG with S-LAD and S-OM done at time of Aortic dissection repair  . Diastolic heart failure   . Dissection of aorta, thoracic     a. Type A; s/p repair 7/11 with aortic root repair and CABG x 2   . ASD (atrial septal defect)     a. s/p repair 1982 at [facility]  . Right heart failure     a. 2/2 TR and RV dysfxn;  b. echo 4/12: EF 60%, mild LVH, mild AI, mild MR, mod LAE, mod RVE with mod dec. RVSF, mod RAE, mod to severe TR, PASP 58;  c. right heart cath 4/12:  RA mean 8, RV 46/1 with mean 6, PA 45/13 with mean 26, PCWP mean 14, CO 3.68, CI 2.1 (no sig pulmon HTN)  . GERD (gastroesophageal reflux disease)   . IBS (irritable bowel syndrome)   . Esophageal stricture   . Colonic polyp   . Peripheral neuropathy   . Back pain   . Fibromyalgia   .  Anxiety   . HLD (hyperlipidemia)   . Borderline diabetes   . History of TIAs   . DJD (degenerative joint disease)   . Normocytic anemia   . Thrombocytopenia   . Macular degeneration   . Aneurysm 2011    Disecting aortic aneurysm  . Depression   . Atrial septal defect   . Esophageal dysmotilities     Past Surgical History  Procedure Date  . Asd repair 1982    [facility]  . Rotator cuff repair 2007    right  . Cardioversion     x 3  . Tubal ligation   . Appendectomy   . Vaginal hysterectomy 1987    A/P Repair With Cystocele and rectocele repair  . Tonsillectomy   . Oophorectomy 1994    BSO  . Pelvic laparoscopy 1994  . Emergency redo median sternotomy for hemiarch repair of acute type a aortic  dissection 06/05/2010  . Nasal hemorrhage control 12/27/2011    Procedure: EPISTAXIS CONTROL;  Surgeon: Melvenia Beam, MD;  Location: [facility] OR;  Service: ENT;  Laterality: N/A;    Family History  Problem Relation Age of Onset  . Pancreatic cancer Sister   . Osteoporosis Sister   . Lung cancer Brother  x 2  . Heart attack Brother   . Melanoma Sister   . Diabetes Mother   . Hypertension Mother   . Colon cancer Sister   . ALS Mother   . Heart disease Brother     x 2    History  Substance Use Topics  . Smoking status: Never Smoker   . Smokeless tobacco: Never Used  . Alcohol Use: No    OB History    Grav Para Term Preterm Abortions TAB SAB Ect Mult Living   4 3 3  1     3       Review of Systems  Constitutional: Negative for fever, chills, diaphoresis and fatigue.  HENT: Positive for nosebleeds. Negative for congestion, rhinorrhea and sneezing.   Eyes: Negative.   Respiratory: Negative for cough, chest tightness and shortness of breath.   Cardiovascular: Negative for chest pain and leg swelling.  Gastrointestinal: Negative for nausea, vomiting, abdominal pain, diarrhea and blood in stool.  Genitourinary: Negative for frequency, hematuria, flank pain and difficulty  urinating.  Musculoskeletal: Negative for back pain and arthralgias.  Skin: Negative for rash.  Neurological: Negative for dizziness, speech difficulty, weakness, numbness and headaches.    Allergies  Atorvastatin; Codeine; Erythromycin; Ezetimibe; Meperidine hcl; Morphine; Ropinirole hydrochloride; Simvastatin; and Triple antibiotic  Home Medications   Current Outpatient Rx  Name Route Sig Dispense Refill  . ACETAMINOPHEN 325 MG PO TABS Oral Take 650 mg by mouth every 6 (six) hours as needed. For pain    . OCUVITE PO TABS Oral Take 1 tablet by mouth 2 (two) times daily.      Marland Kitchen VITAMIN D 1000 UNITS PO TABS Oral Take 1,000 Units by mouth 2 (two) times daily.    Marland Kitchen CLORAZEPATE DIPOTASSIUM 7.5 MG PO TABS Oral Take 7.5 mg by mouth 3 (three) times daily as needed. For nerves.    . FUROSEMIDE 80 MG PO TABS Oral Take 80 mg by mouth 2 (two) times daily.    Marland Kitchen GABAPENTIN 300 MG PO CAPS Oral Take 600 mg by mouth at bedtime.    Marland Kitchen METOLAZONE 2.5 MG PO TABS Oral Take 2.5 mg by mouth See admin instructions. Take One Tablet By Mouth 30 Minutes Prior To Furosemide Dose Every Monday and Thursday morning    . METOPROLOL TARTRATE 50 MG PO TABS Oral Take 50 mg by mouth 2 (two) times daily.    Marland Kitchen OMEPRAZOLE 20 MG PO CPDR Oral Take 20 mg by mouth 2 (two) times daily.    Marland Kitchen POTASSIUM CHLORIDE CRYS ER 20 MEQ PO TBCR Oral Take 40-60 mEq by mouth See admin instructions. Take 2 tablets by mouth two times a day and take an extra potassium on Monday and Thursday with Metolazone    . ROSUVASTATIN CALCIUM 5 MG PO TABS Oral Take 5 mg by mouth every other day.    . SERTRALINE HCL 50 MG PO TABS Oral Take 50 mg by mouth every morning.    Marland Kitchen TRAMADOL HCL 50 MG PO TABS Oral Take 1 tablet (50 mg total) by mouth every 8 (eight) hours as needed. For pain 30 tablet 0  . ZOLPIDEM TARTRATE 10 MG PO TABS Oral Take 10 mg by mouth at bedtime as needed. For sleep.      BP 100/59  Pulse 91  Temp(Src) 97.9 F (36.6 C) (Axillary)  Resp  22  SpO2 100%  Physical Exam  Constitutional: She is oriented to person, place, and time. She appears well-developed and well-nourished.  HENT:  Head: Normocephalic and atraumatic.       Large amount of bleeding in the posterior pharynx. There is some dry blood in both nares, but no active bleeding from the ears  Eyes: Pupils are equal, round, and reactive to light.  Neck: Normal range of motion. Neck supple.  Cardiovascular: Normal rate, regular rhythm and normal heart sounds.   Pulmonary/Chest: Effort normal and breath sounds normal. No respiratory distress. She has no wheezes. She has no rales. She exhibits no tenderness.  Abdominal: Soft. Bowel sounds are normal. There is no tenderness. There is no rebound and no guarding.  Musculoskeletal: Normal range of motion. She exhibits no edema.  Lymphadenopathy:    She has no cervical adenopathy.  Neurological: She is alert and oriented to person, place, and time.  Skin: Skin is warm and dry. No rash noted.  Psychiatric: She has a normal mood and affect.    ED Course  EPISTAXIS MANAGEMENT Date/Time: 01/13/2012 8:36 AM Performed by: Tobenna Needs Authorized by: Rolan Bucco Consent: Verbal consent obtained. Risks and benefits: risks, benefits and alternatives were discussed Consent given by: patient Patient understanding: patient states understanding of the procedure being performed Patient consent: the patient's understanding of the procedure matches consent given Local anesthetic: lidocaine spray Anesthetic total: 1 ml Patient sedated: no Treatment site: right posterior and right anterior Repair method: anterior pack (7.5cm rapid rhino) Post-procedure assessment: bleeding decreased Patient tolerance: Patient tolerated the procedure well with no immediate complications.   (including critical care time)  Labs Reviewed  PROTIME-INR - Abnormal; Notable for the following:    Prothrombin Time 15.3 (*)    All other components  within normal limits  POCT I-STAT, CHEM 8 - Abnormal; Notable for the following:    Glucose, Bld 125 (*)    Hemoglobin 10.5 (*)    HCT 31.0 (*)    All other components within normal limits   No results found.   1. Epistaxis       MDM  Patient with nosebleed. Probable posterior, bleed. Will consult ENT 8:15, talked with Dr. Jenne Pane, he is in OR, will attempt nasal packing  Per Dr. Jenne Pane request, consulted IR for possible embolization of nasal artery       Rolan Bucco, MD 01/13/12 1557

## 2012-01-13 NOTE — Addendum Note (Signed)
Addendum  created 01/13/12 1548 by Zenon Mayo, MD   Modules edited:Orders, PRL Based Order Sets

## 2012-01-13 NOTE — ED Notes (Signed)
ENT consent for procedure obtained by ENT MD.

## 2012-01-13 NOTE — ED Notes (Addendum)
Pt with total output from nose equal to 75ml.  Suctioning at bedside for draining blood from patient's mouth.  Pt with multiple marble size, dime size, and quarter size clots from mouth from drainage from nose.  MD, Gilmore Laroche, at bedside to pack patient's nose.  Patient with decreases blood from nose.  Pt with vitals within normal limits. Head of Bed remains at 90 degrees.

## 2012-01-13 NOTE — ED Notes (Signed)
Awaiting confirmation of plan of care post procedure from MD, Belfi.

## 2012-01-13 NOTE — ED Notes (Signed)
This RN called Interventional radiology to inquire about time of pt's procedure.  Per Kriste Basque, RN at Interventional Radiology, pt to be transferred within the hour.  Plan of care discussed with patient and patient family.  Pt and pt family verbalized understanding.

## 2012-01-13 NOTE — Anesthesia Preprocedure Evaluation (Addendum)
Anesthesia Evaluation  Patient identified by MRN, date of birth, ID band Patient awake    Reviewed: Allergy & Precautions, H&P , NPO status , Patient's Chart, lab work & pertinent test results, reviewed documented beta blocker date and time   Airway Mallampati: II TM Distance: >3 FB Neck ROM: Full    Dental No notable dental hx. (+) Teeth Intact   Pulmonary sleep apnea ,  clear to auscultation  Pulmonary exam normal       Cardiovascular hypertension, On Medications and On Home Beta Blockers + CAD + dysrhythmias Atrial Fibrillation Irregular Normal    Neuro/Psych PSYCHIATRIC DISORDERS  Neuromuscular disease    GI/Hepatic Neg liver ROS, GERD-  Medicated and Controlled,  Endo/Other  Negative Endocrine ROS  Renal/GU negative Renal ROS  Genitourinary negative   Musculoskeletal   Abdominal   Peds  Hematology negative hematology ROS (+)   Anesthesia Other Findings   Reproductive/Obstetrics negative OB ROS                          Anesthesia Physical Anesthesia Plan  ASA: IV and Emergent  Anesthesia Plan: General   Post-op Pain Management:    Induction: Intravenous  Airway Management Planned: Oral ETT  Additional Equipment: Arterial line  Intra-op Plan:   Post-operative Plan: Extubation in OR and Possible Post-op intubation/ventilation  Informed Consent: I have reviewed the patients History and Physical, chart, labs and discussed the procedure including the risks, benefits and alternatives for the proposed anesthesia with the patient or authorized representative who has indicated his/her understanding and acceptance.     Plan Discussed with: CRNA  Anesthesia Plan Comments:        Anesthesia Quick Evaluation

## 2012-01-13 NOTE — ED Notes (Signed)
Patient states, "that pain has really eased off; I'm feeling much better".

## 2012-01-13 NOTE — ED Notes (Signed)
Report received from Julie, RN. Assumed care of pt at this time.

## 2012-01-13 NOTE — CV Procedure (Signed)
Posst procedure patient extubated without difficulty. Slowly returning to pre procedural levels neurologically.. C/O low back ache. Oriented to place year and space. Pupils 3mm RRRto LDC . EOMS full. No  Facial asymmetry. Tongue midline. Motor  No drift of outstretched hands. Moves  LEs equally . Rt groin soft .Peripheries warm  Plan  Admit for observation. D?w Dr Jenne Pane.

## 2012-01-13 NOTE — ED Notes (Signed)
[id number] Ready

## 2012-01-13 NOTE — ED Notes (Signed)
Patient less anxious.

## 2012-01-13 NOTE — ED Notes (Signed)
Per Kriste Basque, RN with interventional radiology, pt to have general anesthesia and will not return to ED.  MD, Belfi, could not confirm.  Charge RN, Tresa Endo, made aware.

## 2012-01-13 NOTE — ED Notes (Signed)
Pt awaiting ENT consult.

## 2012-01-13 NOTE — Preoperative (Signed)
Beta Blockers   Reason not to administer Beta Blockers:pt was given 5 mg Metoprolo prior tp induction

## 2012-01-13 NOTE — ED Notes (Signed)
Vital signs stable.  Per pt and pt family report, pt with baseline vitals at home +/- 110/60

## 2012-01-13 NOTE — ED Notes (Signed)
Per EMS pt woke up at 0530 this morning with blood in her mouth. Pt is not on any blood thinners at this time. BP 195/176 and then 132/68 in route. Pt sts her hgb has been low. 20g to LAC.

## 2012-01-13 NOTE — ED Notes (Signed)
Patient repositioned for comfort.  Pt assisted with swish and swallow of water to rinse blood out of patient's mouth.  PIV site checked and flushed for patency.  PIV patent.  No erythema/edema/swelling at PIV site in Left Antecubital.

## 2012-01-13 NOTE — Transfer of Care (Signed)
Immediate Anesthesia Transfer of Care Note  Patient: [name]  Procedure(s) Performed: * No procedures listed *  Patient Location: PACU  Anesthesia Type: General  Level of Consciousness: sedated and patient cooperative  Airway & Oxygen Therapy: Patient Spontanous Breathing and Patient connected to face mask oxygen  Post-op Assessment: Report given to PACU RN, Post -op Vital signs reviewed and stable, Patient moving all extremities X 4 and Patient able to stick tongue midline  Post vital signs: Reviewed and stable  Complications: No apparent anesthesia complications

## 2012-01-13 NOTE — ED Notes (Signed)
Verbal report given to Carmelina Noun, RN from interventional radiology. Pt to go to IR now.

## 2012-01-13 NOTE — ED Notes (Signed)
Family at bedside. 

## 2012-01-13 NOTE — Addendum Note (Signed)
Addendum  created 01/13/12 1548 by W. Edmond Abimael Zeiter, MD   Modules edited:Orders, PRL Based Order Sets    

## 2012-01-14 LAB — POCT ACTIVATED CLOTTING TIME: Activated Clotting Time: 154 seconds

## 2012-01-14 MED ORDER — TRAMADOL HCL 50 MG PO TABS
50.0000 mg | ORAL_TABLET | Freq: Three times a day (TID) | ORAL | Status: DC | PRN
  Administered 2012-01-14: 50 mg via ORAL
  Filled 2012-01-14: qty 1

## 2012-01-14 MED ORDER — CLORAZEPATE DIPOTASSIUM 7.5 MG PO TABS
7.5000 mg | ORAL_TABLET | Freq: Three times a day (TID) | ORAL | Status: DC | PRN
  Administered 2012-01-14: 7.5 mg via ORAL
  Filled 2012-01-14: qty 1

## 2012-01-14 NOTE — Discharge Summary (Signed)
Physician Discharge Summary  Patient ID: [name] MRN: [id number] DOB/AGE: 22-Mar-1935 77 y.o.  Admit date: 01/13/2012 Discharge date: 01/14/2012  Admission Diagnoses: Epistaxis  Discharge Diagnoses: Same Active Problems:  * No active hospital problems. *    Discharged Condition: serious  Hospital Course: 77 year old with history of aortic replacement and atrial fibrillation previously on Coumadin.  Treated for bilateral severe epistaxis 2/11 with bilateral sphenopalatine cautery and packing.  Had some minor bleeding since then at times but had bad bleeding from right nose yesterday morning early.  Came to ER where pack was placed controlling bleeding.  Was taken for attempted embolization yesterday but was unsuccessful due to concerns about the aortic replacement and potential dissection.  She has remained stable since then in the ICU setting with no active bleeding and a pack remaining in the right nose.  Hemoglobin level was 7.7 last night without concerning symptoms.  I have discussed her situation with the family, my partners, and doctors at [facility] and transfer for further management at [facility] is arranged.  Consults: Interventional radiology  Significant Diagnostic Studies: angiography: failed  Treatments: IV hydration  Discharge Exam: Blood pressure 104/58, pulse 88, temperature 98.4 F (36.9 C), temperature source Oral, resp. rate 20, height 5\' 7"  (1.702 m), weight 74.5 kg (164 lb 3.9 oz), SpO2 99.00%. General appearance: alert, cooperative and anxious. Nose: Right nasal passage with rapid rhino pack.  No significant bleeding.  Disposition: 01-Home or Self Care  Disregard.  Being transferred to [facility].  Discharge Orders    Future Appointments: Provider: Department: Dept Phone: Center:   01/27/2012 11:30 AM [facility] [phone] None   03/07/2012 9:30 AM Michele Mcalpine, MD [facility] [phone] None     Future  Orders Please Complete By Expires   Diet - low sodium heart healthy      Increase activity slowly        Medication List  As of 01/14/2012 10:02 AM   TAKE these medications         acetaminophen 325 MG tablet   Commonly known as: TYLENOL   Take 650 mg by mouth every 6 (six) hours as needed. For pain      beta carotene w/minerals tablet   Take 1 tablet by mouth 2 (two) times daily.      cholecalciferol 1000 UNITS tablet   Commonly known as: VITAMIN D   Take 1,000 Units by mouth 2 (two) times daily.      clorazepate 7.5 MG tablet   Commonly known as: TRANXENE   Take 7.5 mg by mouth 3 (three) times daily as needed. For nerves.      furosemide 80 MG tablet   Commonly known as: LASIX   Take 80 mg by mouth 2 (two) times daily.      gabapentin 300 MG capsule   Commonly known as: NEURONTIN   Take 600 mg by mouth at bedtime.      metolazone 2.5 MG tablet   Commonly known as: ZAROXOLYN   Take 2.5 mg by mouth See admin instructions. Take One Tablet By Mouth 30 Minutes Prior To Furosemide Dose Every Monday and Thursday morning      metoprolol 50 MG tablet   Commonly known as: LOPRESSOR   Take 50 mg by mouth 2 (two) times daily.      omeprazole 20 MG capsule   Commonly known as: PRILOSEC   Take 20 mg by mouth 2 (two) times daily.  potassium chloride SA 20 MEQ tablet   Commonly known as: K-DUR,KLOR-CON   Take 40-60 mEq by mouth See admin instructions. Take 2 tablets by mouth two times a day and take an extra potassium on Monday and Thursday with Metolazone      rosuvastatin 5 MG tablet   Commonly known as: CRESTOR   Take 5 mg by mouth every other day.      sertraline 50 MG tablet   Commonly known as: ZOLOFT   Take 50 mg by mouth every morning.      traMADol 50 MG tablet   Commonly known as: ULTRAM   Take 1 tablet (50 mg total) by mouth every 8 (eight) hours as needed. For pain      zolpidem 10 MG tablet   Commonly known as: AMBIEN   Take 10 mg by mouth at bedtime as  needed. For sleep.           Follow-up Information    Follow up with Michele Mcalpine, MD .         SignedJenne Pane, Shylo Zamor 01/14/2012, 10:02 AM

## 2012-01-14 NOTE — Progress Notes (Signed)
01/13/12  21:10 CBC results of Hgb 7.7 and Hct 24.3 called to Dr. Jenne Pane, who indicated he did not want to treat it at this time.

## 2012-01-17 ENCOUNTER — Other Ambulatory Visit (HOSPITAL_COMMUNITY): Payer: Self-pay | Admitting: Otolaryngology

## 2012-01-17 ENCOUNTER — Telehealth (HOSPITAL_COMMUNITY): Payer: Self-pay

## 2012-01-17 DIAGNOSIS — R040 Epistaxis: Secondary | ICD-10-CM

## 2012-01-17 LAB — TYPE AND SCREEN
ABO/RH(D): A POS
Antibody Screen: NEGATIVE
Unit division: 0

## 2012-01-19 ENCOUNTER — Telehealth: Payer: Self-pay | Admitting: Cardiovascular Disease

## 2012-01-19 NOTE — Telephone Encounter (Signed)
I spoke with the pt and she was discharged from [facility] last night after having nasal surgery due to recurrent nose bleeds.  Per the pt's discharge instructions she can resume coumadin.  The pt said she is very afraid of restarting this medication due to multiple nose bleeds.  The pt would like to be seen by the coumadin clinic and have them instruct her on restarting coumadin.  Appointment scheduled on 01/20/12.

## 2012-01-19 NOTE — Telephone Encounter (Signed)
Pt had nose bleed and needs to be seen asap

## 2012-01-20 ENCOUNTER — Ambulatory Visit (INDEPENDENT_AMBULATORY_CARE_PROVIDER_SITE_OTHER): Payer: Medicare Other | Admitting: *Deleted

## 2012-01-20 DIAGNOSIS — I4891 Unspecified atrial fibrillation: Secondary | ICD-10-CM

## 2012-01-20 DIAGNOSIS — Z8679 Personal history of other diseases of the circulatory system: Secondary | ICD-10-CM

## 2012-01-21 ENCOUNTER — Other Ambulatory Visit: Payer: Self-pay | Admitting: Pulmonary Disease

## 2012-01-24 ENCOUNTER — Telehealth: Payer: Self-pay | Admitting: Pulmonary Disease

## 2012-01-24 NOTE — Telephone Encounter (Signed)
LMOM that refill for Gabapentin was sent to pharmacy.

## 2012-01-25 ENCOUNTER — Ambulatory Visit (INDEPENDENT_AMBULATORY_CARE_PROVIDER_SITE_OTHER): Payer: Medicare Other | Admitting: Pharmacist

## 2012-01-25 DIAGNOSIS — Z8679 Personal history of other diseases of the circulatory system: Secondary | ICD-10-CM

## 2012-01-25 DIAGNOSIS — I4891 Unspecified atrial fibrillation: Secondary | ICD-10-CM

## 2012-01-27 ENCOUNTER — Telehealth: Payer: Self-pay | Admitting: Pulmonary Disease

## 2012-01-27 NOTE — Telephone Encounter (Signed)
lmtcb x1 for pt. 

## 2012-01-28 ENCOUNTER — Telehealth: Payer: Self-pay | Admitting: Cardiovascular Disease

## 2012-01-28 NOTE — Telephone Encounter (Signed)
Per last ov note- Dr Sandria Manly is the prescribing MD for these meds and so I advised that she needs to call and ask him for refills on these meds. Pt verbalized understanding and states nothing further needed.

## 2012-01-28 NOTE — Telephone Encounter (Signed)
Pt's dtr requesting blood work be added to visit on 02-08-12/pls call pt about what she needs [phone], ok to leave message, or cell [phone], pt knows lauren out today, and Monday call ok

## 2012-01-31 ENCOUNTER — Telehealth: Payer: Self-pay | Admitting: Cardiovascular Disease

## 2012-01-31 ENCOUNTER — Ambulatory Visit (INDEPENDENT_AMBULATORY_CARE_PROVIDER_SITE_OTHER): Payer: Medicare Other | Admitting: Physician Assistant

## 2012-01-31 ENCOUNTER — Encounter: Payer: Self-pay | Admitting: Physician Assistant

## 2012-01-31 VITALS — BP 111/70 | HR 71 | Ht 67.0 in | Wt 163.0 lb

## 2012-01-31 DIAGNOSIS — I7101 Dissection of thoracic aorta: Secondary | ICD-10-CM

## 2012-01-31 DIAGNOSIS — D62 Acute posthemorrhagic anemia: Secondary | ICD-10-CM

## 2012-01-31 DIAGNOSIS — I4891 Unspecified atrial fibrillation: Secondary | ICD-10-CM

## 2012-01-31 DIAGNOSIS — I2510 Atherosclerotic heart disease of native coronary artery without angina pectoris: Secondary | ICD-10-CM

## 2012-01-31 DIAGNOSIS — I50810 Right heart failure, unspecified: Secondary | ICD-10-CM

## 2012-01-31 DIAGNOSIS — I509 Heart failure, unspecified: Secondary | ICD-10-CM

## 2012-01-31 NOTE — Progress Notes (Signed)
[address] [place], [place]  [address] Phone: [phone] Fax:  [phone]  Date:  01/31/2012   Name:  [name]       DOB:  [birth year] MRN:  [id number]  PCP:  Dr. Kriste Basque  Primary Cardiologist:  Dr. Tonny Bollman  Primary Electrophysiologist:  None    History of Present Illness: [name] is a 77 y.o. female who presents for evaluation of edema and weight gain.  She has a history of ASD repair at [facility] in 1982 who underwent emergent repair of a type A aortic dissection 05/2010 accompanied by bypass x2 with a vein graft to the LAD and circumflex (of note she had 40% left main stenosis with mild plaque disease in the circumflex, LAD and RCA), diastolic heart failure, right-sided heart failure in the setting of tricuspid regurgitation RV dysfunction and paroxysmal atrial fibrillation on chronic Coumadin therapy. Admitted 5/12 with acute right-sided heart failure in the setting of moderate to severe tricuspid regurgitation and RV dysfunction. She underwent right heart catheterization that demonstrated no significant pulmonary hypertension.    Last echo 08/2011: Mild LVH, EF 55-60%, mild AI, massively dilated LA, mildly dilated RV, moderate R. AE, moderate TR, PAS P. 60.  Last seen by Dr. Excell Seltzer 11/2011.  At that time she was maintaining sinus rhythm.  Her volume status was stable.  She has had followup with cardiac surgery to survey her aortic dissection.  She recently suffered significant epistaxis.  She had Coumadin held for some time.  She was actually transferred from [facility] to [name] last month for surgical correction.  She has apparently recently been told she can go back on Coumadin.  Last noted Hgb prior to tx to [facility] was 7.7 and she required transfusion with PRBCs.    She called in today with weight gain and was added onto my schedule.  Of note, her healthcare power of attorney has called in with concerns of the patient falling this  morning and may be related to medications (Ambien and Tranxene).  There is also some concerns about the patient driving.  She has felt weak since she was transferred to [facility].  She also notes significant dyspnea on exertion.  She describes probable class III symptoms.  Her weight is up 5 pounds at home since 01/26/12.  She notes positive orthopnea sleeping on 3-4 pillows.  She denies PND.  She thinks she may have some extra swelling in her legs.  She can tell that she's been out of rhythm for the last couple of weeks.  She notes an elevated heart rate while she was at [facility].  She was wearing socks this morning in her bathroom and tripped on the threshold causing her to fall.  She landed on her behind.  She denies any head injury.  She denies syncope.  She denies chest pain.  Past Medical History  Diagnosis Date  . OSA (obstructive sleep apnea)   . HTN (hypertension)   . Atrial fibrillation     paroxysmal; on coumadin  . CAD (coronary artery disease)     a. cath 7/11: LM 40%, mild plaque disease in CFX, LAD, and RCA;  b. CABG with S-LAD and S-OM done at time of Aortic dissection repair  . Diastolic heart failure   . Dissection of aorta, thoracic     a. Type A; s/p repair 7/11 with aortic root repair and CABG x 2   . ASD (atrial septal defect)  a. s/p repair 1982 at [facility]  . Right heart failure     a. 2/2 TR and RV dysfxn;  b. echo 4/12: EF 60%, mild LVH, mild AI, mild MR, mod LAE, mod RVE with mod dec. RVSF, mod RAE, mod to severe TR, PASP 58;  c. right heart cath 4/12:  RA mean 8, RV 46/1 with mean 6, PA 45/13 with mean 26, PCWP mean 14, CO 3.68, CI 2.1 (no sig pulmon HTN)  . GERD (gastroesophageal reflux disease)   . IBS (irritable bowel syndrome)   . Esophageal stricture   . Colonic polyp   . Peripheral neuropathy   . Back pain   . Fibromyalgia   . Anxiety   . HLD (hyperlipidemia)   . Borderline diabetes   . History of TIAs   . DJD (degenerative joint disease)   . Normocytic anemia   .  Thrombocytopenia   . Macular degeneration   . Aneurysm 2011    Disecting aortic aneurysm  . Depression   . Atrial septal defect   . Esophageal dysmotilities     Current Outpatient Prescriptions  Medication Sig Dispense Refill  . acetaminophen (TYLENOL) 325 MG tablet Take 650 mg by mouth every 6 (six) hours as needed. For pain      . beta carotene w/minerals (OCUVITE) tablet Take 1 tablet by mouth 2 (two) times daily.        . cholecalciferol (VITAMIN D) 1000 UNITS tablet Take 1,000 Units by mouth 2 (two) times daily.      . clorazepate (TRANXENE) 7.5 MG tablet Take 7.5 mg by mouth 3 (three) times daily as needed. For nerves.      . furosemide (LASIX) 80 MG tablet Take 80 mg by mouth 2 (two) times daily.      Marland Kitchen gabapentin (NEURONTIN) 300 MG capsule TAKE 2 CAPSULES BY MOUTH AT BEDTIME  60 capsule  4  . metolazone (ZAROXOLYN) 2.5 MG tablet Take 2.5 mg by mouth See admin instructions. Take One Tablet By Mouth 30 Minutes Prior To Furosemide Dose Every Monday and Thursday morning      . metoprolol (LOPRESSOR) 50 MG tablet Take 50 mg by mouth 2 (two) times daily.      Marland Kitchen omeprazole (PRILOSEC) 20 MG capsule Take 20 mg by mouth 2 (two) times daily.      . potassium chloride SA (K-DUR,KLOR-CON) 20 MEQ tablet Take 40-60 mEq by mouth See admin instructions. Take 2 tablets by mouth two times a day and take an extra potassium on Monday and Thursday with Metolazone      . rosuvastatin (CRESTOR) 5 MG tablet Take 5 mg by mouth every other day.      . sertraline (ZOLOFT) 50 MG tablet Take 50 mg by mouth every morning.      . traMADol (ULTRAM) 50 MG tablet Take 1 tablet (50 mg total) by mouth every 8 (eight) hours as needed. For pain  30 tablet  0  . zolpidem (AMBIEN) 10 MG tablet Take 10 mg by mouth at bedtime as needed. For sleep.        Allergies: Allergies  Allergen Reactions  . Atorvastatin     REACTION: muscle pain  . Codeine     REACTION: itching  . Erythromycin   . Ezetimibe     REACTION:  INTOL to Zetia w/ cough  . Meperidine Hcl     REACTION: dizziness  . Morphine   . Ropinirole Hydrochloride     REACTION: INTOL to Requip  w/ sleep paralysis  . Simvastatin     REACTION: unable to walk--muscle pain  . Triple Antibiotic     History  Substance Use Topics  . Smoking status: Never Smoker   . Smokeless tobacco: Never Used  . Alcohol Use: No     ROS:  Please see the history of present illness.   She does take Ambien as well as Tranxene to help her sleep.  She also takes tramadol for pain.  She denies fevers, chills, cough, melena, hematochezia.  She denies any further epistaxis.  All other systems reviewed and negative.   PHYSICAL EXAM: VS:  BP 111/70  Pulse 71  Ht 5\' 7"  (1.702 m)  Wt 163 lb (73.936 kg)  BMI 25.53 kg/m2  O2 100% on room air Well nourished, well developed, in no acute distress HEENT: normal Neck: + JVD Cardiac:  normal S1, S2; irreg irreg; 2/6 systolic murmur along LSB Lungs:  clear to auscultation bilaterally, no wheezing, rhonchi or rales Abd: soft, nontender, no hepatomegaly Ext: 1+ bilateral edema Skin: warm and dry Neuro:  CNs 2-12 intact, no focal abnormalities noted  EKG:  Atrial fibrillation, rate 70, normal axis, nonspecific ST-T wave changes  Lab Results  Component Value Date   CREATININE 0.90 01/13/2012   BUN 21 01/13/2012   NA 141 01/13/2012   K 3.7 01/13/2012   CL 99 01/13/2012   CO2 29 12/30/2011    Lab Results  Component Value Date   WBC 5.9 01/13/2012   HGB 7.7* 01/13/2012   HCT 24.3* 01/13/2012   MCV 93.1 01/13/2012   PLT 173 01/13/2012    Lab Results  Component Value Date   INR 1.3 01/25/2012   INR 1.1 01/20/2012   INR 1.18 01/13/2012              ASSESSMENT AND PLAN:  1. Right heart failure  I believe that she is volume overloaded.  She takes metolazone twice a week.  I will have her take this for the rest of the week with extra potassium 20 mEq.  She has followup next week with Dr. Excell Seltzer.  She should keep that  appointment.  She knows to contact us if any worse.  Check a basic metabolic panel today as well as a repeat basic metabolic panel at the end of this week to keep a close eye on her renal function and potassium.   2. Atrial fibrillation  I reviewed her chart and she has been in sinus rhythm dating back to 2010.  I suspected being back in atrial fibrillation is contributing to some of her weakness.  I suspect her weakness is multifactorial and related to volume overload in addition to A. fibrillation as well as anemia and use of benzodiazepines and hypnotics.  She is back on Coumadin.  I'll have the Coumadin clinic monitor her Coumadin weekly in case we have to consider cardioversion.  I have asked her to try and limit use of ambien, tranxene, tramadol.     3. CORONARY ARTERY DISEASE  Stable without angina.   4. Anemia due to blood loss, acute  Check a followup CBC today.   5. DISSECTING AORTIC ANEURYSM THORACIC  She is followed closely by Dr. Cornelius Moras.     Signed, Tereso Newcomer, PA-C  2:20 PM 01/31/2012

## 2012-01-31 NOTE — Patient Instructions (Addendum)
Your physician has recommended you make the following change in your medication: FOR THE REST THIS WEEK WE WOULD LIKE FOR YOU TO TAKE Metolazone TUES, WED, THUR, FRI AND WITH THIS; TAKE YOUR POTASSIUM 3 TABS IN THE MORNING AND 2 TABS IN THE EVENING 3/19-3/22,   STARTING BACK ON Saturday 02/05/12 YOU WILL RESUME YOUR PREVIOUS DOSE  Your physician recommends that you return for lab work in: TODAY BMET, CBC W/DIFF  Your physician recommends that you return for lab work in: [employer] 427.31, 428.0, 285.1  LIMIT THE USE OF AMBIEN AND TRANXENE AS MUCH AS POSSIBLE  HAVE THE COUMADIN CLINIC CHECK YOUR INR WEEKLY FOR THE POSSIBILITY YOU WILL NEED TO BE CARDIOVERTER  KEEP YOUR APPT WITH DR. Excell Seltzer ON 02/08/12 @ 10AM

## 2012-01-31 NOTE — Telephone Encounter (Signed)
I spoke with the pt's daughter who is the pt's Healthcare POA.  The pt's daughter does not want the pt to know that she called the office.  Misty Stanley wanted to make [place] aware that the pt fell this morning.  She said this was most likely medication related.  The pt took an Ambien 10mg  last night at 10 PM and then took a Tranzene and 2 tylenol between 2-4 AM this morning.  The pt's daughter also wanted [place] to be aware that the pt wants to resume driving.  Misty Stanley feels like the pt cannot drive at this point.  I made Misty Stanley aware that I will make the PA aware of our discussion.

## 2012-01-31 NOTE — Telephone Encounter (Signed)
New msg Pt's daughter called and wanted to give some info before appt this afternoon with Tereso Newcomer. She said her BP 107/60. She said she walked at mall and was a little weak yesterday. She said she fell this morning also. Please call to discuss further

## 2012-01-31 NOTE — Telephone Encounter (Signed)
I spoke with the pt and she is complaining of SOB, weakness and increased weight.  The pt said she has not been eating very much due to nose bleeds and nasal surgery. The pt denies swelling.  Weight: 3/15 153 3/16 156.8 3/17 157.8 3/18 159.4    I have scheduled the pt to come into the office today to see Tereso Newcomer PA-C.

## 2012-02-01 ENCOUNTER — Telehealth: Payer: Self-pay | Admitting: *Deleted

## 2012-02-01 LAB — BASIC METABOLIC PANEL
BUN: 25 mg/dL — ABNORMAL HIGH (ref 6–23)
CO2: 26 mEq/L (ref 19–32)
Calcium: 9.1 mg/dL (ref 8.4–10.5)
Creatinine, Ser: 0.9 mg/dL (ref 0.4–1.2)
GFR: 68.49 mL/min (ref >60.00)
Glucose, Bld: 112 mg/dL — ABNORMAL HIGH (ref 70–99)

## 2012-02-01 LAB — CBC WITH DIFFERENTIAL/PLATELET
Basophils Absolute: 0 10*3/uL (ref 0.0–0.1)
Eosinophils Absolute: 0.1 10*3/uL (ref 0.0–0.7)
Lymphocytes Relative: 16.3 % (ref 12.0–46.0)
MCHC: 31.4 g/dL (ref 30.0–36.0)
Monocytes Relative: 9.7 % (ref 3.0–12.0)
Neutro Abs: 4.5 10*3/uL (ref 1.4–7.7)
Platelets: 212 10*3/uL (ref 150.0–400.0)
RDW: 15.3 % — ABNORMAL HIGH (ref 11.5–14.6)

## 2012-02-01 NOTE — Telephone Encounter (Signed)
s/w son and he is aware of  lab results and gave verbal understanding. Danielle Rankin

## 2012-02-01 NOTE — Telephone Encounter (Signed)
Message copied by Tarri Fuller on Tue Feb 01, 2012  5:30 PM ------      Message from: [place], [place] T      Created: Tue Feb 01, 2012  4:49 PM       [facility] improved      [place], [place]  4:48 PM 02/01/2012

## 2012-02-08 ENCOUNTER — Encounter: Payer: Self-pay | Admitting: Cardiovascular Disease

## 2012-02-08 ENCOUNTER — Telehealth: Payer: Self-pay | Admitting: *Deleted

## 2012-02-08 ENCOUNTER — Ambulatory Visit (INDEPENDENT_AMBULATORY_CARE_PROVIDER_SITE_OTHER): Payer: Medicare Other | Admitting: Cardiovascular Disease

## 2012-02-08 ENCOUNTER — Ambulatory Visit (INDEPENDENT_AMBULATORY_CARE_PROVIDER_SITE_OTHER): Payer: Medicare Other

## 2012-02-08 VITALS — BP 114/68 | HR 60 | Ht 67.0 in | Wt 154.0 lb

## 2012-02-08 DIAGNOSIS — I2510 Atherosclerotic heart disease of native coronary artery without angina pectoris: Secondary | ICD-10-CM

## 2012-02-08 DIAGNOSIS — I509 Heart failure, unspecified: Secondary | ICD-10-CM

## 2012-02-08 DIAGNOSIS — I5032 Chronic diastolic (congestive) heart failure: Secondary | ICD-10-CM

## 2012-02-08 DIAGNOSIS — Z8679 Personal history of other diseases of the circulatory system: Secondary | ICD-10-CM

## 2012-02-08 DIAGNOSIS — I50810 Right heart failure, unspecified: Secondary | ICD-10-CM

## 2012-02-08 DIAGNOSIS — I4891 Unspecified atrial fibrillation: Secondary | ICD-10-CM

## 2012-02-08 DIAGNOSIS — R0602 Shortness of breath: Secondary | ICD-10-CM

## 2012-02-08 LAB — BASIC METABOLIC PANEL
BUN: 27 mg/dL — ABNORMAL HIGH (ref 6–23)
Chloride: 97 mEq/L (ref 96–112)
Creatinine, Ser: 0.7 mg/dL (ref 0.4–1.2)
GFR: 88.31 mL/min (ref >60.00)
Potassium: 2.7 mEq/L — CL (ref 3.5–5.1)

## 2012-02-08 LAB — CBC WITH DIFFERENTIAL/PLATELET
Basophils Relative: 0.6 % (ref 0.0–3.0)
Eosinophils Relative: 1.5 % (ref 0.0–5.0)
MCV: 86.6 fl (ref 78.0–100.0)
Monocytes Relative: 11.7 % (ref 3.0–12.0)
Neutrophils Relative %: 69.9 % (ref 43.0–77.0)
Platelets: 215 10*3/uL (ref 150.0–400.0)
RBC: 3.31 Mil/uL — ABNORMAL LOW (ref 3.87–5.11)
WBC: 6.1 10*3/uL (ref 4.5–10.5)

## 2012-02-08 LAB — POCT INR: INR: 3.6

## 2012-02-08 LAB — BRAIN NATRIURETIC PEPTIDE: Pro B Natriuretic peptide (BNP): 709 pg/mL — ABNORMAL HIGH (ref 0.0–100.0)

## 2012-02-08 NOTE — Assessment & Plan Note (Signed)
The patient is back on Coumadin with no recurrent bleeding postoperatively. I've asked her to stop aspirin. She has episodic palpitations and if these worsen or if her heart failure progresses we may need to consider monitoring to evaluate her atrial fib burden and consider antiarrhythmic therapies which will be somewhat limited in this patient.

## 2012-02-08 NOTE — Assessment & Plan Note (Signed)
Patient's labs were drawn this morning to evaluate potential mechanism of her profound fatigue. I thought she may have worsening anemia, but in fact her hemoglobin is stable at 9.1 mg/dL. She does have marked hypokalemia and this will need to be aggressively repleted. She has been taking 40 mEq of K-Dur twice daily and this will be increased to 3 times daily over the next 3 days. She will return this Thursday for repeat labs. Despite her weight being down, her BNP is more elevated. Overall I think her volume status is better but remains tenuous in the setting of her severe tricuspid regurgitation. I will followup closely with her in the next one to 2 weeks.

## 2012-02-08 NOTE — Telephone Encounter (Signed)
Spoke with pt and reviewed BMP results and instructions from Dr. Excell Seltzer for pt to take K-Dur 40 meq three times today and tomorrow and to take 40 meq Thursday AM and come in for [facility] Thursday AM. She verbalizes understanding of instructions.

## 2012-02-08 NOTE — Progress Notes (Signed)
HPI:  77 year old woman presenting for followup evaluation. The patient has a complex history with remote ASD repair in 1982, Type A aortic dissection in 2011 status post emergency surgical repair and coronary bypass, and chronic right heart failure with severe tricuspid regurgitation and RV dysfunction. Right heart catheterization in April 2012 showed a mean right atrial pressure of 8 with large CV waves, PA pressure of 45/13 with a mean of 26, and pulmonary catheter wedge pressure of 14. Her cardiac output and index were 3.7 and 2.1 respectively. Unfortunately, she has developed marked epistaxis requiring multiple ENT surgeries. She had to be transferred to [facility] for surgery several weeks ago. She is finally starting to improve from all this, but postoperatively she developed progressive dyspnea with orthopnea and PND as well as weight gain. She was seen by Tereso Newcomer last week and was treated appropriately with increased diuretics. She is feeling better and breathing easier but remains severely weak. She denies chest pain or pressure, lightheadedness, syncope, or leg swelling. She is fatigued with minimal activity. She feels like sleeping much of the time. She has been taking her medications as prescribed. Her weight is down 9 pounds since her visit here one week ago.  Outpatient Encounter Prescriptions as of 02/08/2012  Medication Sig Dispense Refill  . acetaminophen (TYLENOL) 325 MG tablet Take 650 mg by mouth every 6 (six) hours as needed. For pain      . aspirin 81 MG tablet Take 81 mg by mouth daily.      . beta carotene w/minerals (OCUVITE) tablet Take 1 tablet by mouth 2 (two) times daily.        . cholecalciferol (VITAMIN D) 1000 UNITS tablet Take 1,000 Units by mouth 2 (two) times daily.      . furosemide (LASIX) 80 MG tablet Take 80 mg by mouth 2 (two) times daily.      Marland Kitchen gabapentin (NEURONTIN) 300 MG capsule TAKE 2 CAPSULES BY MOUTH AT BEDTIME  60 capsule  4  . metolazone  (ZAROXOLYN) 2.5 MG tablet Take 2.5 mg by mouth See admin instructions. Take One Tablet By Mouth 30 Minutes Prior To Furosemide Dose Every mon and Thursday morning      . metoprolol (LOPRESSOR) 50 MG tablet Take 50 mg by mouth 2 (two) times daily.      Marland Kitchen omeprazole (PRILOSEC) 20 MG capsule Take 20 mg by mouth 2 (two) times daily.      . potassium chloride SA (K-DUR,KLOR-CON) 20 MEQ tablet Take 40-60 mEq by mouth See admin instructions. Take 2 tablets by mouth two times a day and take an extra potassium on Monday and Thursday with Metolazone      . rosuvastatin (CRESTOR) 5 MG tablet Take 5 mg by mouth every other day.      . sertraline (ZOLOFT) 50 MG tablet Take 50 mg by mouth every morning.      . traMADol (ULTRAM) 50 MG tablet Take 1 tablet (50 mg total) by mouth every 8 (eight) hours as needed. For pain  30 tablet  0  . warfarin (COUMADIN) 5 MG tablet Take 5 mg by mouth as directed.      . zolpidem (AMBIEN) 10 MG tablet Take 10 mg by mouth at bedtime as needed. For sleep.      Marland Kitchen DISCONTD: Budesonide (RHINOCORT AQUA NA) Place into the nose at bedtime.        Allergies  Allergen Reactions  . Atorvastatin     REACTION: muscle  pain  . Codeine     REACTION: itching  . Erythromycin   . Ezetimibe     REACTION: INTOL to Zetia w/ cough  . Meperidine Hcl     REACTION: dizziness  . Morphine   . Ropinirole Hydrochloride     REACTION: INTOL to Requip w/ sleep paralysis  . Simvastatin     REACTION: unable to walk--muscle pain  . Triple Antibiotic     Past Medical History  Diagnosis Date  . OSA (obstructive sleep apnea)   . HTN (hypertension)   . Atrial fibrillation     paroxysmal; on coumadin  . CAD (coronary artery disease)     a. cath 7/11: LM 40%, mild plaque disease in CFX, LAD, and RCA;  b. CABG with S-LAD and S-OM done at time of Aortic dissection repair  . Diastolic heart failure   . Dissection of aorta, thoracic     a. Type A; s/p repair 7/11 with aortic root repair and CABG x 2     . ASD (atrial septal defect)     a. s/p repair 1982 at [facility]  . Right heart failure     a. 2/2 TR and RV dysfxn;  b. echo 4/12: EF 60%, mild LVH, mild AI, mild MR, mod LAE, mod RVE with mod dec. RVSF, mod RAE, mod to severe TR, PASP 58;  c. right heart cath 4/12:  RA mean 8, RV 46/1 with mean 6, PA 45/13 with mean 26, PCWP mean 14, CO 3.68, CI 2.1 (no sig pulmon HTN)  . GERD (gastroesophageal reflux disease)   . IBS (irritable bowel syndrome)   . Esophageal stricture   . Colonic polyp   . Peripheral neuropathy   . Back pain   . Fibromyalgia   . Anxiety   . HLD (hyperlipidemia)   . Borderline diabetes   . History of TIAs   . DJD (degenerative joint disease)   . Normocytic anemia   . Thrombocytopenia   . Macular degeneration   . Aneurysm 2011    Disecting aortic aneurysm  . Depression   . Atrial septal defect   . Esophageal dysmotilities     ROS: Negative except as per HPI  BP 114/68  Pulse 60  Ht 5\' 7"  (1.702 m)  Wt 69.854 kg (154 lb)  BMI 24.12 kg/m2  PHYSICAL EXAM: Pt is alert and oriented, pleasant woman in NAD HEENT: normal Neck: JVP - large V waves, carotids 2+= without bruits Lungs: CTA bilaterally CV: RRR with grade 2/6 blowing systolic murmur at the left lower sternal border Abd: soft, NT, Positive BS, there is hepatomegaly with a pulsatile liver Ext: no C/C/E, distal pulses intact and equal Skin: warm/dry no rash  ASSESSMENT AND PLAN:

## 2012-02-08 NOTE — Patient Instructions (Addendum)
Your physician recommends that you schedule a follow-up appointment in: 4 weeks.   Your physician recommends that you continue on your current medications as directed. Please refer to the Current Medication list given to you today.  Pt given samples of Crestor 5 mg. --21 tablets--Lot BB [id number]. Exp 10/15

## 2012-02-10 ENCOUNTER — Ambulatory Visit (INDEPENDENT_AMBULATORY_CARE_PROVIDER_SITE_OTHER): Payer: Medicare Other | Admitting: *Deleted

## 2012-02-10 ENCOUNTER — Other Ambulatory Visit (INDEPENDENT_AMBULATORY_CARE_PROVIDER_SITE_OTHER): Payer: Medicare Other

## 2012-02-10 ENCOUNTER — Other Ambulatory Visit: Payer: Self-pay

## 2012-02-10 DIAGNOSIS — Z8679 Personal history of other diseases of the circulatory system: Secondary | ICD-10-CM

## 2012-02-10 DIAGNOSIS — I509 Heart failure, unspecified: Secondary | ICD-10-CM

## 2012-02-10 DIAGNOSIS — I5032 Chronic diastolic (congestive) heart failure: Secondary | ICD-10-CM

## 2012-02-10 DIAGNOSIS — I4891 Unspecified atrial fibrillation: Secondary | ICD-10-CM

## 2012-02-10 LAB — BASIC METABOLIC PANEL
CO2: 31 mEq/L (ref 19–32)
Chloride: 98 mEq/L (ref 96–112)
Glucose, Bld: 119 mg/dL — ABNORMAL HIGH (ref 70–99)
Potassium: 3.8 mEq/L (ref 3.5–5.1)
Sodium: 139 mEq/L (ref 135–145)

## 2012-02-14 ENCOUNTER — Ambulatory Visit (INDEPENDENT_AMBULATORY_CARE_PROVIDER_SITE_OTHER): Payer: Medicare Other | Admitting: Cardiovascular Disease

## 2012-02-14 ENCOUNTER — Telehealth: Payer: Self-pay | Admitting: Cardiovascular Disease

## 2012-02-14 ENCOUNTER — Encounter: Payer: Self-pay | Admitting: Cardiovascular Disease

## 2012-02-14 VITALS — BP 124/72 | HR 77 | Ht 67.0 in | Wt 153.1 lb

## 2012-02-14 DIAGNOSIS — I50810 Right heart failure, unspecified: Secondary | ICD-10-CM

## 2012-02-14 DIAGNOSIS — I5033 Acute on chronic diastolic (congestive) heart failure: Secondary | ICD-10-CM

## 2012-02-14 DIAGNOSIS — I2510 Atherosclerotic heart disease of native coronary artery without angina pectoris: Secondary | ICD-10-CM

## 2012-02-14 DIAGNOSIS — I509 Heart failure, unspecified: Secondary | ICD-10-CM

## 2012-02-14 DIAGNOSIS — I4891 Unspecified atrial fibrillation: Secondary | ICD-10-CM

## 2012-02-14 NOTE — Telephone Encounter (Signed)
Please return call to patient [phone]  Patient c/o SOB, fatigue no dizziness at this time.  Patient would like to be seen, please return call to patient at [phone].

## 2012-02-14 NOTE — Patient Instructions (Addendum)
Your physician has recommended you make the following change in your medication: INCREASE Metolazone to every other day and take an extra of potassium on these days, Start holding Coumadin on Wednesday  Your physician has requested that you have a cardiac catheterization. Cardiac catheterization is used to diagnose and/or treat various heart conditions. Doctors may recommend this procedure for a number of different reasons. The most common reason is to evaluate chest pain. Chest pain can be a symptom of coronary artery disease (CAD), and cardiac catheterization can show whether plaque is narrowing or blocking your heart's arteries. This procedure is also used to evaluate the valves, as well as measure the blood flow and oxygen levels in different parts of your heart. For further information please visit [email or web address] Please follow instruction sheet, as given.

## 2012-02-14 NOTE — Telephone Encounter (Signed)
I spoke with the pt and she complains of SOB, nausea, heart pounding and fatigue. The pt said her legs just feel like they are going to give out and the pt has started using a walker again.   The pt's weight today is 158.4 and she does not have any appetite. The pt is having to sleep on a wedge and use multiple pillows due to difficulty breathing.  I will speak with Dr Excell Seltzer about this patient.    Per Dr Excell Seltzer the pt can be seen today at 2:15. Pt aware of appointment.

## 2012-02-15 ENCOUNTER — Telehealth: Payer: Self-pay | Admitting: Pulmonary Disease

## 2012-02-15 ENCOUNTER — Telehealth: Payer: Self-pay | Admitting: Cardiovascular Disease

## 2012-02-15 DIAGNOSIS — E876 Hypokalemia: Secondary | ICD-10-CM

## 2012-02-15 MED ORDER — TRAMADOL HCL 50 MG PO TABS: 50.0000 mg | ORAL_TABLET | Freq: Three times a day (TID) | ORAL | Status: DC | PRN

## 2012-02-15 NOTE — Telephone Encounter (Signed)
I spoke with Clydie Braun in the cath lab and cancelled procedure. BMP ordered for 02/24/12.

## 2012-02-15 NOTE — Assessment & Plan Note (Signed)
The patient is in atrial fib today. She is rate controlled. I suspect this is contributing to her clinical decompensation. I don't think there is any hope of maintaining sinus rhythm considering her valvular heart disease and giant atrial size.

## 2012-02-15 NOTE — Telephone Encounter (Signed)
I spoke with the patient and she does not want to proceed with cardiac catheterization at this time. I will see her back as scheduled in 2 weeks and we will do the best we can with medical therapy. I would like her to have a metabolic panel drawn when she comes in on April 11 for Coumadin clinic visit.

## 2012-02-15 NOTE — Telephone Encounter (Signed)
I spoke with the pt and she wants to know if it is okay to hold off on cardiac cath at this time. The pt feels like mentally and physically she cannot go through another procedure at this time.  I will forward this information to Dr Excell Seltzer for review.

## 2012-02-15 NOTE — Assessment & Plan Note (Signed)
The patient has progressive symptoms of heart failure. I have reviewed her previous echocardiographic and cardiac catheterization data. She had a right heart catheterization one year ago but has not undergone left heart catheterization or angiography since her emergency aortic root repair and coronary bypass surgery. The patient has severe tricuspid regurgitation and at least moderate mitral regurgitation. I am going to increase her metolazone to every other day dosing as she still has congestive symptoms. I have asked her to hold Coumadin beginning Wednesday and plan on a right and left heart catheterization next Monday. I am not going to bridge her with Lovenox considering her recent bleeding events. [name] has good insight into her disease process and she realizes there may not be much more we can offer her, but I think it is important to rule out pulmonary hypertension or severe coronary disease as contributing factors to her clinical decompensation. I will schedule her catheterization in the inpatient cath lab because of her medical complexity and likelihood that she may require hospital admission for treatment of heart failure.

## 2012-02-15 NOTE — Assessment & Plan Note (Signed)
The patient is status post CABG. Plans for cardiac catheterization as above.

## 2012-02-15 NOTE — Telephone Encounter (Signed)
New Msg: pt calling wanting to speak with nurse regarding pt heart Cath on Monday. Please return pt call to discuss further.

## 2012-02-15 NOTE — Telephone Encounter (Signed)
I spoke with pt and she stated she needed her tramadol sent to [facility]. I advised pt will send rx and nothing further was needed. rx has been sent

## 2012-02-15 NOTE — Progress Notes (Signed)
HPI:  76 year old woman presented for followup evaluation patient has a history of ASD repair, type A aortic dissection, coronary bypass, and chronic right heart failure with severe tricuspid regurgitation. She was recently seen last week after her diuretics have been increased to treat congestive heart failure. She complained of severe weakness at that time. She was found to have marked hypokalemia and potassium was aggressively supplemented.. Followup labs from March 28 showed a potassium of 3.8 and a creatinine of 0.8.  Unfortunately, the patient continues to be severely limited with marked shortness of breath. This occurs with minimal activity. The patient has orthopnea and continues to have very poor sleep. She denies leg swelling. Her appetite has been okay. Her weight is stable by our scales but has increased on her home scale. Yesterday at church she became severely short of breath with walking down the hallway and she also complained of associated lightheadedness and weakness. She has had no frank chest pain or pressure. She has had no further epistaxis.  Outpatient Encounter Prescriptions as of 02/14/2012  Medication Sig Dispense Refill  . acetaminophen (TYLENOL) 325 MG tablet Take 650 mg by mouth every 6 (six) hours as needed. For pain      . beta carotene w/minerals (OCUVITE) tablet Take 1 tablet by mouth 2 (two) times daily.        . cholecalciferol (VITAMIN D) 1000 UNITS tablet Take 1,000 Units by mouth 2 (two) times daily.      . furosemide (LASIX) 80 MG tablet Take 80 mg by mouth 2 (two) times daily.      Marland Kitchen gabapentin (NEURONTIN) 300 MG capsule TAKE 2 CAPSULES BY MOUTH AT BEDTIME  60 capsule  4  . metolazone (ZAROXOLYN) 2.5 MG tablet Take One Tablet By Mouth 30 Minutes Prior To Furosemide Dose Every other day      . metoprolol (LOPRESSOR) 50 MG tablet Take 50 mg by mouth 2 (two) times daily.      Marland Kitchen omeprazole (PRILOSEC) 20 MG capsule Take 20 mg by mouth 2 (two) times daily.      .  potassium chloride SA (K-DUR,KLOR-CON) 20 MEQ tablet Take 2 tablets by mouth two times a day and take an extra 40 mEq (2 tablets) potassium on the days you take Metolazone      . rosuvastatin (CRESTOR) 5 MG tablet Take 5 mg by mouth every other day.      . sertraline (ZOLOFT) 50 MG tablet Take 50 mg by mouth every morning.      . traMADol (ULTRAM) 50 MG tablet Take 1 tablet (50 mg total) by mouth every 8 (eight) hours as needed. For pain  30 tablet  0  . warfarin (COUMADIN) 5 MG tablet Take 5 mg by mouth as directed.      . zolpidem (AMBIEN) 10 MG tablet Take 10 mg by mouth at bedtime as needed. For sleep.      Marland Kitchen DISCONTD: metolazone (ZAROXOLYN) 2.5 MG tablet Take 2.5 mg by mouth See admin instructions. Take One Tablet By Mouth 30 Minutes Prior To Furosemide Dose Every mon and Thursday morning      . DISCONTD: potassium chloride SA (K-DUR,KLOR-CON) 20 MEQ tablet Take 2 tablets by mouth two times a day and take an extra 40 mEq potassium on Monday and Thursday with Metolazone      . DISCONTD: aspirin 81 MG tablet Take 81 mg by mouth daily.        Allergies  Allergen Reactions  . Atorvastatin  REACTION: muscle pain  . Codeine     REACTION: itching  . Erythromycin   . Ezetimibe     REACTION: INTOL to Zetia w/ cough  . Meperidine Hcl     REACTION: dizziness  . Morphine   . Ropinirole Hydrochloride     REACTION: INTOL to Requip w/ sleep paralysis  . Simvastatin     REACTION: unable to walk--muscle pain  . Triple Antibiotic     Past Medical History  Diagnosis Date  . OSA (obstructive sleep apnea)   . HTN (hypertension)   . Atrial fibrillation     paroxysmal; on coumadin  . CAD (coronary artery disease)     a. cath 7/11: LM 40%, mild plaque disease in CFX, LAD, and RCA;  b. CABG with S-LAD and S-OM done at time of Aortic dissection repair  . Diastolic heart failure   . Dissection of aorta, thoracic     a. Type A; s/p repair 7/11 with aortic root repair and CABG x 2   . ASD (atrial  septal defect)     a. s/p repair 1982 at [facility]  . Right heart failure     a. 2/2 TR and RV dysfxn;  b. echo 4/12: EF 60%, mild LVH, mild AI, mild MR, mod LAE, mod RVE with mod dec. RVSF, mod RAE, mod to severe TR, PASP 58;  c. right heart cath 4/12:  RA mean 8, RV 46/1 with mean 6, PA 45/13 with mean 26, PCWP mean 14, CO 3.68, CI 2.1 (no sig pulmon HTN)  . GERD (gastroesophageal reflux disease)   . IBS (irritable bowel syndrome)   . Esophageal stricture   . Colonic polyp   . Peripheral neuropathy   . Back pain   . Fibromyalgia   . Anxiety   . HLD (hyperlipidemia)   . Borderline diabetes   . History of TIAs   . DJD (degenerative joint disease)   . Normocytic anemia   . Thrombocytopenia   . Macular degeneration   . Aneurysm 2011    Disecting aortic aneurysm  . Depression   . Atrial septal defect   . Esophageal dysmotilities     ROS: Negative except as per HPI  BP 124/72  Pulse 77  Ht 5\' 7"  (1.702 m)  Wt 69.455 kg (153 lb 1.9 oz)  BMI 23.98 kg/m2  SpO2 100%  PHYSICAL EXAM: Pt is alert and oriented, pleasant elderly woman in NAD HEENT: normal Neck: JVP - giant V waves with positive HJR, carotids 2+= without bruits Lungs: CTA bilaterally CV: irregularly irregular with 3/6 holosystolic murmur at the LSB Abd: soft, NT, Positive BS, hepatomegaly with pulsatile liver Ext: no C/C/E, distal pulses intact and equal Skin: warm/dry no rash  EKG:  Atrial fibrillation 70 bpm, LVH with repolarization abnormality  ASSESSMENT AND PLAN:

## 2012-02-16 ENCOUNTER — Other Ambulatory Visit: Payer: Self-pay | Admitting: Pulmonary Disease

## 2012-02-21 ENCOUNTER — Encounter (HOSPITAL_COMMUNITY): Payer: Self-pay

## 2012-02-21 ENCOUNTER — Ambulatory Visit (HOSPITAL_COMMUNITY): Admit: 2012-02-21 | Payer: Medicare Other | Admitting: Cardiovascular Disease

## 2012-02-21 SURGERY — LEFT AND RIGHT HEART CATHETERIZATION WITH CORONARY ANGIOGRAM
Anesthesia: LOCAL

## 2012-02-24 ENCOUNTER — Other Ambulatory Visit: Payer: Self-pay | Admitting: Thoracic Surgery (Cardiothoracic Vascular Surgery)

## 2012-02-24 ENCOUNTER — Other Ambulatory Visit (INDEPENDENT_AMBULATORY_CARE_PROVIDER_SITE_OTHER): Payer: Medicare Other

## 2012-02-24 ENCOUNTER — Ambulatory Visit (INDEPENDENT_AMBULATORY_CARE_PROVIDER_SITE_OTHER): Payer: Medicare Other | Admitting: *Deleted

## 2012-02-24 DIAGNOSIS — I7101 Dissection of thoracic aorta: Secondary | ICD-10-CM

## 2012-02-24 DIAGNOSIS — Z8679 Personal history of other diseases of the circulatory system: Secondary | ICD-10-CM

## 2012-02-24 DIAGNOSIS — E876 Hypokalemia: Secondary | ICD-10-CM

## 2012-02-24 DIAGNOSIS — I4891 Unspecified atrial fibrillation: Secondary | ICD-10-CM

## 2012-02-24 LAB — BASIC METABOLIC PANEL
CO2: 30 mEq/L (ref 19–32)
Chloride: 98 mEq/L (ref 96–112)
Creatinine, Ser: 0.9 mg/dL (ref 0.4–1.2)
Glucose, Bld: 139 mg/dL — ABNORMAL HIGH (ref 70–99)

## 2012-02-29 ENCOUNTER — Encounter: Payer: Self-pay | Admitting: Cardiovascular Disease

## 2012-02-29 ENCOUNTER — Ambulatory Visit: Payer: Medicare Other | Admitting: Cardiovascular Disease

## 2012-02-29 ENCOUNTER — Ambulatory Visit (INDEPENDENT_AMBULATORY_CARE_PROVIDER_SITE_OTHER): Payer: Medicare Other | Admitting: Cardiovascular Disease

## 2012-02-29 VITALS — BP 110/66 | HR 72 | Ht 67.0 in | Wt 153.8 lb

## 2012-02-29 DIAGNOSIS — I4891 Unspecified atrial fibrillation: Secondary | ICD-10-CM

## 2012-02-29 DIAGNOSIS — I509 Heart failure, unspecified: Secondary | ICD-10-CM

## 2012-02-29 DIAGNOSIS — I50810 Right heart failure, unspecified: Secondary | ICD-10-CM

## 2012-02-29 MED ORDER — AMIODARONE HCL 400 MG PO TABS: 400.0000 mg | ORAL_TABLET | Freq: Two times a day (BID) | ORAL | Status: DC

## 2012-02-29 MED ORDER — AMIODARONE HCL 200 MG PO TABS: 200.0000 mg | ORAL_TABLET | Freq: Every day | ORAL | Status: DC

## 2012-02-29 NOTE — Assessment & Plan Note (Signed)
The patient is more symptomatic since she has been in atrial fib. I think she is going to be very difficult to maintain in sinus rhythm. She is tolerating long-term Coumadin again and her epistaxis has resolved. I have recommended that she start on amiodarone 400 mg twice daily for 2 weeks, then will decrease to 200 mg daily. Check a TSH and liver function tests when she returns in a few weeks for blood work. I reviewed all the potential long-term risks of amiodarone and she understands this. If she stays in atrial fib, I'll plan on outpatient cardioversion after she is fully loaded with amiodarone. She will come in for weekly INRs while amiodarone is being initiated.

## 2012-02-29 NOTE — Assessment & Plan Note (Signed)
The patient continues to have New York Heart Association class III symptoms. This is an improvement from when I saw her last a few weeks ago. I am going to continue her current diuretic regimen. I think atrial fibrillation is further exacerbating her CHF symptoms. She will followup in 3-4 weeks with repeat labs and an office visit. See below for discussion of her atrial fibrillation.

## 2012-02-29 NOTE — Patient Instructions (Addendum)
Your physician recommends that you schedule a follow-up appointment in: 3-4 weeks with Dr. Excell Seltzer.  Your physician recommends that you return for lab work in: 3-4 weeks just prior to seeing Dr. Excell Seltzer. (bmp, liver, cbc, tsh)  Please schedule an appointment with the Coumadin Clinic in 1 week.  Start Amiodarone 400mg  take one by mouth twice a day for 2 weeks and then decrease to 200mg  daily.

## 2012-02-29 NOTE — Progress Notes (Signed)
HPI:  77 year old woman presenting for followup evaluation. The patient has a history of remote surgical ASD repair, type A aortic dissection with surgical repair and concomitant coronary bypass surgery. She has developed chronic right heart failure with severe tricuspid regurgitation. When I saw her last, I recommended right and left heart catheterization in the setting of continued clinical decline. The patient decided not to move forward with this because she has been through so much in the last several months. We adjusted her diuretic therapy and she is now taking metolazone every other day in addition to Lasix 80 mg twice daily. She feels better. She notes that she's been sleeping a little better at night. She still has 3 pillow orthopnea but this is improved from where she has been. She has no more resting dyspnea and her legs feel stronger. She continues to have dyspnea with low-level activity. She denies chest pain. She has frequent palpitations, especially at rest. She feels her heart pounding and can see her heartbeat in her chest.  Outpatient Encounter Prescriptions as of 02/29/2012  Medication Sig Dispense Refill  . acetaminophen (TYLENOL) 325 MG tablet Take 650 mg by mouth every 6 (six) hours as needed. For pain      . beta carotene w/minerals (OCUVITE) tablet Take 1 tablet by mouth 2 (two) times daily.        . cholecalciferol (VITAMIN D) 1000 UNITS tablet Take 1,000 Units by mouth 2 (two) times daily.      . furosemide (LASIX) 80 MG tablet Take 80 mg by mouth 2 (two) times daily.      Marland Kitchen gabapentin (NEURONTIN) 300 MG capsule TAKE 2 CAPSULES BY MOUTH AT BEDTIME  60 capsule  4  . metolazone (ZAROXOLYN) 2.5 MG tablet Take One Tablet By Mouth 30 Minutes Prior To Furosemide Dose Every other day      . metoprolol (LOPRESSOR) 50 MG tablet Take 50 mg by mouth 2 (two) times daily.      Marland Kitchen omeprazole (PRILOSEC) 20 MG capsule TAKE 1 CAPSULE BY MOUTH  TWICE A DAY 30 MINUTES BEFORE MEALS  60 capsule  0   . potassium chloride SA (K-DUR,KLOR-CON) 20 MEQ tablet Take 20 mEq by mouth 2 (two) times daily. Take3 tablets by mouth two times a day and take an extra 40 mEq (2 tablets) potassium on the days you take Metolazone      . rosuvastatin (CRESTOR) 5 MG tablet Take 5 mg by mouth every other day.      . sertraline (ZOLOFT) 50 MG tablet Take 50 mg by mouth every morning.      . traMADol (ULTRAM) 50 MG tablet Take 1 tablet (50 mg total) by mouth 3 (three) times daily as needed. For pain  90 tablet  3  . warfarin (COUMADIN) 5 MG tablet Take 5 mg by mouth as directed.      Marland Kitchen DISCONTD: zolpidem (AMBIEN) 10 MG tablet Take 10 mg by mouth at bedtime as needed. For sleep.        Allergies  Allergen Reactions  . Atorvastatin     REACTION: muscle pain  . Codeine     REACTION: itching  . Erythromycin   . Ezetimibe     REACTION: INTOL to Zetia w/ cough  . Meperidine Hcl     REACTION: dizziness  . Morphine   . Ropinirole Hydrochloride     REACTION: INTOL to Requip w/ sleep paralysis  . Simvastatin     REACTION: unable to walk--muscle pain  .  Triple Antibiotic     Past Medical History  Diagnosis Date  . OSA (obstructive sleep apnea)   . HTN (hypertension)   . Atrial fibrillation     paroxysmal; on coumadin  . CAD (coronary artery disease)     a. cath 7/11: LM 40%, mild plaque disease in CFX, LAD, and RCA;  b. CABG with S-LAD and S-OM done at time of Aortic dissection repair  . Diastolic heart failure   . Dissection of aorta, thoracic     a. Type A; s/p repair 7/11 with aortic root repair and CABG x 2   . ASD (atrial septal defect)     a. s/p repair 1982 at [facility]  . Right heart failure     a. 2/2 TR and RV dysfxn;  b. echo 4/12: EF 60%, mild LVH, mild AI, mild MR, mod LAE, mod RVE with mod dec. RVSF, mod RAE, mod to severe TR, PASP 58;  c. right heart cath 4/12:  RA mean 8, RV 46/1 with mean 6, PA 45/13 with mean 26, PCWP mean 14, CO 3.68, CI 2.1 (no sig pulmon HTN)  . GERD (gastroesophageal  reflux disease)   . IBS (irritable bowel syndrome)   . Esophageal stricture   . Colonic polyp   . Peripheral neuropathy   . Back pain   . Fibromyalgia   . Anxiety   . HLD (hyperlipidemia)   . Borderline diabetes   . History of TIAs   . DJD (degenerative joint disease)   . Normocytic anemia   . Thrombocytopenia   . Macular degeneration   . Aneurysm 2011    Disecting aortic aneurysm  . Depression   . Atrial septal defect   . Esophageal dysmotilities     ROS: Negative except as per HPI  BP 110/66  Pulse 72  Ht 5\' 7"  (1.702 m)  Wt 69.763 kg (153 lb 12.8 oz)  BMI 24.09 kg/m2  PHYSICAL EXAM: Pt is alert and oriented, pleasant woman in NAD HEENT: normal Neck: JVP - markedly elevated with large V waves, carotids 2+= without bruits Lungs: CTA bilaterally CV: Irregularly irregular with grade 3/6 holosystolic murmur at the left sternal border Abd: soft, NT, Positive BS, pulsatile liver with hepatomegaly Ext: Trace pretibial edema, distal pulses intact and equal Skin: warm/dry no rash  EKG:  Atrial fibrillation 72 beats per minute, LVH with marked repolarization abnormality  ASSESSMENT AND PLAN:

## 2012-03-06 ENCOUNTER — Telehealth: Payer: Self-pay | Admitting: Cardiovascular Disease

## 2012-03-06 NOTE — Telephone Encounter (Signed)
New msg Pt is having side effects from taking amiodarone. She is having nausea, very tired and her coordination has not been good. She also has some sob. Please call

## 2012-03-06 NOTE — Telephone Encounter (Signed)
I spoke with the pt and she is having side effects from Amiodarone.  Per Dr Excell Seltzer the pt can discontinue this medication.  I made the pt aware that she can stop amiodarone at this time.

## 2012-03-07 ENCOUNTER — Other Ambulatory Visit (INDEPENDENT_AMBULATORY_CARE_PROVIDER_SITE_OTHER): Payer: Medicare Other

## 2012-03-07 ENCOUNTER — Ambulatory Visit (INDEPENDENT_AMBULATORY_CARE_PROVIDER_SITE_OTHER): Payer: Medicare Other | Admitting: Pulmonary Disease

## 2012-03-07 ENCOUNTER — Ambulatory Visit (INDEPENDENT_AMBULATORY_CARE_PROVIDER_SITE_OTHER): Payer: Medicare Other | Admitting: *Deleted

## 2012-03-07 ENCOUNTER — Encounter: Payer: Self-pay | Admitting: Pulmonary Disease

## 2012-03-07 ENCOUNTER — Ambulatory Visit (INDEPENDENT_AMBULATORY_CARE_PROVIDER_SITE_OTHER)
Admission: RE | Admit: 2012-03-07 | Discharge: 2012-03-07 | Disposition: A | Discharge status: Home / Self Care | Payer: Medicare Other | Source: Ambulatory Visit | Attending: Pulmonary Disease | Admitting: Pulmonary Disease

## 2012-03-07 VITALS — BP 98/68 | HR 72 | Temp 96.3°F | Ht 67.0 in | Wt 153.0 lb

## 2012-03-07 DIAGNOSIS — E538 Deficiency of other specified B group vitamins: Secondary | ICD-10-CM

## 2012-03-07 DIAGNOSIS — I71019 Dissection of thoracic aorta, unspecified: Secondary | ICD-10-CM

## 2012-03-07 DIAGNOSIS — M1990 Unspecified osteoarthritis, unspecified site: Secondary | ICD-10-CM

## 2012-03-07 DIAGNOSIS — Z8679 Personal history of other diseases of the circulatory system: Secondary | ICD-10-CM

## 2012-03-07 DIAGNOSIS — K219 Gastro-esophageal reflux disease without esophagitis: Secondary | ICD-10-CM

## 2012-03-07 DIAGNOSIS — E7800 Pure hypercholesterolemia, unspecified: Secondary | ICD-10-CM

## 2012-03-07 DIAGNOSIS — R7309 Other abnormal glucose: Secondary | ICD-10-CM

## 2012-03-07 DIAGNOSIS — I2510 Atherosclerotic heart disease of native coronary artery without angina pectoris: Secondary | ICD-10-CM

## 2012-03-07 DIAGNOSIS — F419 Anxiety disorder, unspecified: Secondary | ICD-10-CM

## 2012-03-07 DIAGNOSIS — I872 Venous insufficiency (chronic) (peripheral): Secondary | ICD-10-CM

## 2012-03-07 DIAGNOSIS — R269 Unspecified abnormalities of gait and mobility: Secondary | ICD-10-CM | POA: Insufficient documentation

## 2012-03-07 DIAGNOSIS — D649 Anemia, unspecified: Secondary | ICD-10-CM

## 2012-03-07 DIAGNOSIS — G609 Hereditary and idiopathic neuropathy, unspecified: Secondary | ICD-10-CM

## 2012-03-07 DIAGNOSIS — I4891 Unspecified atrial fibrillation: Secondary | ICD-10-CM

## 2012-03-07 DIAGNOSIS — I5033 Acute on chronic diastolic (congestive) heart failure: Secondary | ICD-10-CM

## 2012-03-07 DIAGNOSIS — I10 Essential (primary) hypertension: Secondary | ICD-10-CM

## 2012-03-07 DIAGNOSIS — K589 Irritable bowel syndrome without diarrhea: Secondary | ICD-10-CM

## 2012-03-07 DIAGNOSIS — F341 Dysthymic disorder: Secondary | ICD-10-CM

## 2012-03-07 DIAGNOSIS — F411 Generalized anxiety disorder: Secondary | ICD-10-CM

## 2012-03-07 DIAGNOSIS — I7101 Dissection of thoracic aorta: Secondary | ICD-10-CM

## 2012-03-07 LAB — CBC WITH DIFFERENTIAL/PLATELET
Basophils Relative: 0.6 % (ref 0.0–3.0)
Eosinophils Absolute: 0 10*3/uL (ref 0.0–0.7)
Eosinophils Relative: 0.4 % (ref 0.0–5.0)
Lymphocytes Relative: 19.1 % (ref 12.0–46.0)
Monocytes Relative: 10.8 % (ref 3.0–12.0)
Neutrophils Relative %: 69.1 % (ref 43.0–77.0)
RBC: 3.75 Mil/uL — ABNORMAL LOW (ref 3.87–5.11)
WBC: 5.5 10*3/uL (ref 4.5–10.5)

## 2012-03-07 LAB — BASIC METABOLIC PANEL
BUN: 42 mg/dL — ABNORMAL HIGH (ref 6–23)
CO2: 30 mEq/L (ref 19–32)
Chloride: 97 mEq/L (ref 96–112)
Creatinine, Ser: 1.1 mg/dL (ref 0.4–1.2)

## 2012-03-07 LAB — PROTIME-INR
INR: 6.6 ratio (ref 0.8–1.0)
INR: 6.6 — AB (ref <=1.1)
Prothrombin Time: 73.4 s (ref 10.2–12.4)

## 2012-03-07 LAB — IBC PANEL
Iron: 18 ug/dL — ABNORMAL LOW (ref 42–145)
Saturation Ratios: 4 % — ABNORMAL LOW (ref 20.0–50.0)
Transferrin: 324.8 mg/dL (ref 212.0–360.0)

## 2012-03-07 LAB — CARDIAC PANEL
CK-MB: 1.1 ng/mL (ref 0.3–4.0)
Total CK: 65 U/L (ref 7–177)

## 2012-03-07 LAB — HEPATIC FUNCTION PANEL
ALT: 14 U/L (ref 0–35)
Total Protein: 7.8 g/dL (ref 6.0–8.3)

## 2012-03-07 NOTE — Patient Instructions (Signed)
Today we updated your med list in our EPIC system...    Continue your current medications the same for now except we decided to put the CRESTOR 5mg  on HOLD...  Today we did follow up CXR & blood work to try & figure out why you are so much weaker than just several weeks ago...    We will call you w/ the results ASAP...  Rest at home for now...    Call for any questions...  We will coordinate follow up appts w/ your other physicians.Marland KitchenMarland Kitchen

## 2012-03-07 NOTE — Progress Notes (Addendum)
Subjective:    Patient ID: [name], female    DOB: 05-Jan-1935, 77 y.o.   MRN: [id number]  HPI 77 y/o WF here for a follow up visit... she has multiple medical problems as noted below...     Followed by DrCooper for Cards- hx ASD repair 1982 & dilated AoRoot;  HBP & PAF;  then Aortic dissection w/ emergency surg 7/11 by DrOwen w/ CABG x2 and miraculous recovery...   Followed by DrDBrodie for GI- hx dysphagia & esoph strictures dilated, also hx candida esoph... also prob w/ constip part related to rectocele...   Followed by [facility] & ENT at [facility] for severe epistaxis requiring mult surg in 2013 (see below)...  ~  September 04, 2010:  She developed sudden left sided back & chest pain 06/04/10 w/ signs of ischemia & taken to cath lab w/ AoDissection found along w/ Lmain disease- 11hr emergency operation by DrOwen w/ repair of aortic dissection & CABG x2 (SVG to LAD, & SVG to obtuse marg branch of Circ) was required to get her off the bypass machine... she made a miraculous recovery- followed closely by DrOwen & DrCooper w/ meds as below... getting stonger w/ home health rehab, etc... some recent incr trouble w/ fluid retention & they have adjusted diuretics... XRays & labs reviewed... I have offered assist in any way needed- she requests refill Rxs to [facility].  ~  September 07, 2011:  1 year ROV & she has had a lot going on >> long prob list & multisystem disease w/ mult specialists involved in her care...    Abn CXR> s/p AscAo dissection repair & bilat apical schwannomas (see CXR & CTA report); O2sat= 91% on RA & advised incr exercise at home...    HBP> on Metoprolol50Bid, Lasix80Bid, Zaroxyln2.5 2d/wk, K20Tid- ;  BP= 114/68 & labs showed K= 3.2; advised incr K20 to 2Bid everyday...    CARDIAC> followed by DrCooper & DrOwens w/ long complic cardiac hx> ASD secundum repair at [facility], CAD, PAF, Ac on Chr Diastolic CHF, & 7/11 Asc ThorAo Dissection w/ severe AI & Lmain lesion==> s/p  repair of dissecting aneurysm & CABGx2... She knows to take Amox before dental work.    PAF> on ASA81 + Coumadin in CC, off Amio, followed by DrCooper for Cards    CHOL> on Crestor5 & her FLP looks good (see below)...    DM> borderline DM on diet alone & labs look good; continue diet + exercise...    Hx esophagitis & stricture> followed by DrDBrodie on Prilosec20Bid; last EGD 3/11ndida (Rx'd); denies heartburn, pain, dysphagia, etc...    IBS w/ constip & Rectocele> also followed by DrGottsegen; on stool softeners but she has trouble w/ evacuation & they are aware...    Left flank discomfort> she also sees Urology (prev DrKimbrough) & has f/u appt pending...    DJD/ LBP/ FM> she is actually c/o "corns" on her feet that make walking difficult she says; she will try OTC pads & if not improved she will call Podiatry...    Neuropathy> followed by Autumn Patty on Neurontin300Bid & Tramadol Prn...    Anxiety/ Depression> on Zoloft50, Tranxene7.5 Prn (ave~1/d she says), & [id number];   ~  March 07, 2012:  36mo ROV & [name] has been thru a lot since last OV>> Hx Aortic replacement & AFib prev on Coumadin> Feb2013 developed severe epistaxis:    Treated for bilat severe epistaxis 12/27/11 w/ bilat endoscopic sphenopalatine ligation & packing by DrGore at [facility]; packing  removed 2/18 by DrShoemaker in his office...    Anemic due to acute blood loss> prev Hg= 10-11 range & was 7.7 when Tx to [facility]...    Re-adm 2/28 - 01/14/12 for recurrent bleeding from right nares- packed & attempted embolization was unsuccessful; transferred to [facility] by [facility] for further surg...    She had further surg by ENT at [facility] but we don't have any of these records or f/u note by ENT in [place]... She has had mult f/u visits w/ DrCooper for Cards> back on Coumadin for her AFib (intol to Amio & Coumadin carefully monitored by CC), right heart failure w/ severe TR & no surg option, extremely fatigued/ exhaused/ doing very poorly overall & she  feels she has not recovered from the ENT surg/ nose bleeding episodes... CXR 4/13 showed chr severe cardiomeg, prev CABG, chr pleural blunting but no acute effusions, post-op right axillary changes, NAD... LABS 4/13:  Chems- ok w/ K=4.0 BUN=42 Creat=1.1 BS=122 A1c=7.1;  CBC- Hg=9.1 Fe=18 (4%);  TSH=4.49;  B12=849;  PROTIME= WAY TOO THIN & referred to CC- stat...          Problem List:  SEVERE EPISTAXIS >> see above & managed by DrBates et al + ENT at [facility]...  OBSTRUCTIVE SLEEP APNEA (ICD-327.23) - sleep study 9/06 by Breck Coons showed RDI=25/hr during REM w/ desat to 77%...  ABN CXR w/ left superior mediastinal opacity ?etiology- no change back to 2006 films= benign... ~  CXR 2/11 showed cardiomeg, ectatic Ao, left superior mediast soft tissue prom w/o change (?ectasia of great vessels or benign mass). ~  CXR 9/11 s/p median sternotomy, dissection repair,  & CABG- cardiomeg, bilat effusions & basilar atx, no ch in left superior lesion... ~  CXR 4/12 showed med sternotomy, cardiomeg, hyerinflation/ scarring, some vasc congestion... ~  CTAngio 5/12 showed stable asc ao dissection repair, rounded soft tissues masses both apicies L>R are prob neural tumors (schwannomas)... ~  CXR 4/13 showed chr severe cardiomeg, prev CABG, chr pleural blunting but no acute effusions, post-op right axillary changes, NAD...  HYPERTENSION (ICD-401.9) - controlled on METOPROLOL 50mg Bid, + LASIX/ ZAROXYLN/ KCl...  ~  10/12:  BP=110/70 and she denies HA, visual changes, CP, palipit, syncope... she is fatigued, SOB/ DOE, & +edema... getting PT/ rehab therapy & improving slowly... ~  4/13:  BP= 100/70 & she is very weak s/p epistaxis episodes 2/13 & 3/13 w/ surg here & at [facility]...  ATRIAL FIBRILLATION (ICD-427.31) - on COUMADIN followed in the CC and very carefully monitored due to her severe epistaxis; managed by DrCooper for Cards & she is intol to Amiodarone...  CORONARY ARTERY DISEASE (ICD-414.00) - off ASA due  to severe epistaxis episodes.. ACUTE ON CHRONIC DIASTOLIC HEART FAILURE (ICD-428.33) - on LASIX 80mg Bid, ZAROXYLN 2.5mg Qod, & KCl - taking 4-5-4-5 Qod... 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 long-time pt of DrJoeLeBauer and now DrCooper> ~  Cardiolite 5/06 showed no ischemia & EF=65%...  ~  2DEcho 12/07 showed norm LVF, EF=55%, no regional wall motion abn, & RA/RV= mod dil... ~  Cardiac MRI 3/09 showed mod Ao root enlargement 4.3cm, norm AoV/ Arch/ Desc Ao, mod RV & biatrial enlargement from the prev ASD repair (no resid leak), mod PA enlargement, norm LV w/ EF=59%... ~  [facility] 9/09 w/ MI ruled-out, atyp CP (prob esoph spasm) & Myoview was neg- no scar or ischemia,  EF= 77%... ~  2DEcho 9/10 showed sl incr LV wall thikness c/w mild LVH, norm wall motion, EF= 55-60%, gr2 DD, mild AI, mod dil LA/RA, mod-severe TR, mild pulmHTN... ~  Emergency hosp 7/11 w/ Aortic dissection, severe AI, severe TR & cath w/ 40%Lmain lesion- s/p repair & required CABG x2 as well... ~  DrOwen does CTAngiograms every 73mo... ~  [facility] 4/12 by Cards for CHF from severe TR & right heart disease> SEE 2DEcho & Right heart cath data> reviewed... ~  She saw DrCooper 10/12> felt to be reasonably stable, holding NSR, on Coumadin via CC, & f/u 2DEcho ordered & pending... ~  Labs 10/12 showed K=3.2 on K20Tid+4MTh; TCO2=37, BUN=28, Creat=0.8; pt instructed to incr K20 to [facility] everyday...  VENOUS INSUFFICIENCY (ICD-459.81) & EDEMA (ICD-782.3) - she has chr VI changes, superficial VV, and incr edema since surg 7/11... ~  3/09 ABI's done and were WNL...  HYPERCHOLESTEROLEMIA (ICD-272.0) - on CRESTOR 5mg Qod & she wants to STOP for awhile; she has blamed Zetia for cough in the past... ~  FLP 7/08 showing TChol 143, TG 173, HDL 32, LDL 76. ~  FLP 4/09 showed TChol 172, Tg 161, HDL 38, LDL 102 ~  FLP 7/09 showed  TChol 167. Tg 148, HDL 40, LDL 98... rec- continue same. ~  FLP 1/10 showed TChol 177, TG 85, HDL 43, LDL 117 ~  FLP 2/11 showed TChol 160, TG 83, HDL 50, LDL 94 ~  FLP 10/12 on Cres5 showed TChol 150, TG 81, HDL 52, LDL 82 ~  4/13:  She wants to STOP the Cres5Qod for awhile since she is so weak...  DIABETES MELLITUS, BORDERLINE (ICD-790.29) - on diet Rx alone... ~  Hx borderline BS w/ FBS=119 in 7/08 & HgA1c=6.1 on diet alone. ~  labs 4/09 showed BS= 120, HgA1c= 6.0.Marland KitchenMarland Kitchen continue diet rx. ~  labs 7/09 showed BS= 132 ~  labs 1/10 showed BS= 120, A1c= 6.1 ~  labs 2/11 showed BS= 106 ~  Labs 10/12 showed BS= 121, A1c= 6.3.Marland KitchenMarland Kitchen Ok on diet Rx. ~  Labs 4/13 showed BS= 122, A1c= 7.1 on diet alone & we reviewed low carb diet restriction...  Hx of ESOPHAGEAL STRICTURE (ICD-530.3) - last EGD 6/04 by DrDBrodie w/ esophagitis, hx gastritis... 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 treated w/ PRILOSEC 20mg Bid... ~  [facility] Sep09 w/ atyp CP felt to be esoph spasm... GI f/u DrDBrodie w/ EGD Sep09= candida esoph, nonobstructing esoph stricture dilated. ~  2/11: presents w/ incr dysphagia & f/u GI w/ Ba Esophagram-mild stricture;  EGD 3/11 showed candida esophagitis (Rx'd);  Sonar- no gallstones, WNL.Marland KitchenMarland Kitchen  IRRITABLE BOWEL SYNDROME, HX OF (ICD-V12.79) - colonoscopy was 6/04 by DrDBrodie= neg without polyps etc... f/u 9/09 was negative- no recurrent polyps... f/u planned 9/14... ~  she reports trouble w/ constip and hx recurrent rectocele- initial surg 1987, now sees DrGottsegen for GYN & may need additional surg... ~  She saw DrDBrodie 9/12> sm vol intermit painless rectal bleeding from anal fissures; rectocele, constip, difficulty evacuating; on stool softeners & Nupercainal oint.  UROLOGY> followed by DrKimbrough> Hx microhematuria, renal cyst, duplication & dilatation on the left, left sided pain off & on...  DEGENERATIVE JOINT DISEASE (ICD-715.90) >> on  TRAMADOL 50mg  prn  BACK PAIN, LUMBAR (ICD-724.2) >> she also takes MVI, Vit D...  FIBROMYALGIA (ICD-729.1)  PERIPHERAL NEUROPATHY (ICD-356.9) - sl worse per pt and DrLove follows... she takes NEURONTIN 300mg Bid and refuses other meds... 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  ?TIA - hosp 11/07 by Autumn Patty w/ ?TIA vs sleep paralysis episode... MRIBrain w/ sm vessel dis and atherosclerotic changes... she still complains of "weak spells, my whole body gets weak in the afternoons"... these episodes last ~54min, resolve spont w/ rest or ?better after eating... she is convinced that the sleep paralysis episodes were caused by the Requip med...  ANXIETY DEPRESSION (ICD-300.4) - she states that "my nerves are shot" w/ stress from son's alcoholism & divorce, plus her husb's illness etc... started ZOLOFT 50mg /d 7/10 & improved...  ANEMIA >> due to Anemia of chronic dis & blood loss from epistaxis 2/13 & 3/13... ~  Labs from 2/13 showed baseline Hg= 10-11 range & bleed down to Hg=7.7 on Tx to [facility]... ~  Labs here 4/13 showed Hg= 9.1, Fe= 18 (4%sat); started on FeSO4 325mg  Bid (w/ VitC 500mg ) but she only took once daily.Marland Kitchen  Health Maintenance: ~  GYN= DrGottsegen w/ Mammograms at [facility] (last 4/10 w/ ultrasound f/u)... Gyn does BMD's and she reports it was normal several yrs ago, not on calcium, MVI, etc... ~  Immunizations:  she reports 2010 Flu vaccine & Pneumonia shot in 2010... cna't recall last Tetanus.   Past Medical History  Diagnosis Date  . OSA (obstructive sleep apnea)   . HTN (hypertension)   . Atrial fibrillation     paroxysmal; on coumadin  . CAD (coronary artery disease)     a. cath 7/11: LM 40%, mild plaque disease in CFX, LAD, and RCA;  b. CABG with S-LAD and S-OM done at time of Aortic dissection repair  . Diastolic heart failure   . Dissection of aorta, thoracic     a. Type A; s/p  repair 7/11 with aortic root repair and CABG x 2   . ASD (atrial septal defect)     a. s/p repair 1982 at [facility]  . Right heart failure     a. 2/2 TR and RV dysfxn;  b. echo 4/12: EF 60%, mild LVH, mild AI, mild MR, mod LAE, mod RVE with mod dec. RVSF, mod RAE, mod to severe TR, PASP 58;  c. right heart cath 4/12:  RA mean 8, RV 46/1 with mean 6, PA 45/13 with mean 26, PCWP mean 14, CO 3.68, CI 2.1 (no sig pulmon HTN)  . GERD (gastroesophageal reflux disease)   . IBS (irritable bowel syndrome)   . Esophageal stricture   . Colonic polyp   . Peripheral neuropathy   . Back pain   . Fibromyalgia   . Anxiety   . HLD (hyperlipidemia)   . Borderline diabetes   . History of TIAs   . DJD (degenerative joint disease)   . Normocytic anemia   . Thrombocytopenia   . Macular degeneration   . Aneurysm 2011    Disecting aortic aneurysm  . Depression   . Atrial septal defect   . Esophageal dysmotilities     Past Surgical History  Procedure Date  . Asd repair 1982    [facility]  . Rotator cuff repair 2007    right  . Cardioversion     x 3  . Tubal ligation   . Appendectomy   . Vaginal hysterectomy 1987    A/P Repair With Cystocele and rectocele repair  . Tonsillectomy   . Oophorectomy 1994    BSO  . Pelvic laparoscopy 1994  .  Emergency redo median sternotomy for hemiarch repair of acute type a aortic  dissection 06/05/2010  . Nasal hemorrhage control 12/27/2011    Procedure: EPISTAXIS CONTROL;  Surgeon: Melvenia Beam, MD;  Location: [facility] OR;  Service: ENT;  Laterality: N/A;    Outpatient Encounter Prescriptions as of 03/07/2012  Medication Sig Dispense Refill  . acetaminophen (TYLENOL) 325 MG tablet Take 650 mg by mouth every 6 (six) hours as needed. For pain      . beta carotene w/minerals (OCUVITE) tablet Take 1 tablet by mouth 2 (two) times daily.        . cholecalciferol (VITAMIN D) 1000 UNITS tablet Take 1,000 Units by mouth 2 (two) times daily.      . furosemide (LASIX) 80 MG tablet Take  80 mg by mouth 2 (two) times daily.      Marland Kitchen gabapentin (NEURONTIN) 300 MG capsule TAKE 2 CAPSULES BY MOUTH AT BEDTIME  60 capsule  4  . metolazone (ZAROXOLYN) 2.5 MG tablet Take One Tablet By Mouth 30 Minutes Prior To Furosemide Dose Every other day      . metoprolol (LOPRESSOR) 50 MG tablet Take 50 mg by mouth 2 (two) times daily.      Marland Kitchen omeprazole (PRILOSEC) 20 MG capsule TAKE 1 CAPSULE BY MOUTH  TWICE A DAY 30 MINUTES BEFORE MEALS  60 capsule  0  . potassium chloride SA (K-DUR,KLOR-CON) 20 MEQ tablet Take 20 mEq by mouth 2 (two) times daily. Take3 tablets by mouth two times a day and take an extra 40 mEq (2 tablets) potassium on the days you take Metolazone      . rosuvastatin (CRESTOR) 5 MG tablet Take 5 mg by mouth every other day.      . sertraline (ZOLOFT) 50 MG tablet Take 50 mg by mouth every morning.      . traMADol (ULTRAM) 50 MG tablet Take 1 tablet (50 mg total) by mouth 3 (three) times daily as needed. For pain  90 tablet  3  . warfarin (COUMADIN) 5 MG tablet Take 5 mg by mouth as directed.        Allergies  Allergen Reactions  . Amiodarone     Severe side effects per Pt--  . Atorvastatin     REACTION: muscle pain  . Codeine     REACTION: itching  . Erythromycin   . Ezetimibe     REACTION: INTOL to Zetia w/ cough  . Meperidine Hcl     REACTION: dizziness  . Morphine   . Ropinirole Hydrochloride     REACTION: INTOL to Requip w/ sleep paralysis  . Simvastatin     REACTION: unable to walk--muscle pain  . Triple Antibiotic     Current Medications, Allergies, Past Medical History, Past Surgical History, Family History, and Social History were reviewed in [employer] record.    Review of Systems         See HPI - all other systems neg except as noted...  The patient complains of decreased hearing, dyspnea on exertion, and peripheral edema.  The patient denies anorexia, fever, weight loss, weight gain, vision loss, hoarseness, chest pain, syncope,  prolonged cough, headaches, hemoptysis, abdominal pain, melena, hematochezia, severe indigestion/heartburn, hematuria, incontinence, muscle weakness, suspicious skin lesions, transient blindness, difficulty walking, depression, unusual weight change, abnormal bleeding, enlarged lymph nodes, and angioedema.    Objective:   Physical Exam    WD, Thin, 77 y/o WF in NAD... GENERAL:  Alert & oriented; pleasant & cooperative... HEENT:  [place]/AT, EOM-wnl, PERRLA, Fundi-benign, EACs-clear, TMs-wnl, NOSE-clear, THROAT-clear & wnl... NECK:  Supple w/ fairROM; no JVD; normal carotid impulses w/o bruits; no thyromegaly or nodules palpated; no lymphadenopathy. CHEST:  Median sterotomy scar, decr BS at bases w/ few bibasilar rales... HEART:  Regular Rhythm;  gr 1/6 sys murmur, without rubs or gallops detected... s/p repair ASD & Ao dissection... ABDOMEN:  Soft & nontender; normal bowel sounds; no organomegaly or masses detected. EXT: without deformities, mild arthritic changes; no varicose veins/ +venous insuffic/ 3+ edema. NEURO:  CN's intact;  peripheral neuropathy without focal neuro deficits on exam... DERM:  No lesions noted; no rash etc...  RADIOLOGY DATA:  Reviewed in the EPIC EMR & discussed w/ the patient...  LABORATORY DATA:  Reviewed in the EPIC EMR & discussed w/ the patient...   Assessment & Plan:   Abn CXR> s/p AscAo dissection repair & bilat apical schwannomas (see CXR & CTA report); O2sat= 91% on RA & advised incr exercise at home...     HBP> on Metoprolol50Bid, Lasix80Bid, Zaroxyln2.5 2d/wk, K20 w/ 4-5-4-5 alt day regimen;  BP= 100/70 & followed closely by DrCooper...     CARDIAC> followed by DrCooper & DrOwens w/ long complic cardiac hx> ASD secundum repair at [facility], CAD, PAF, Ac on Chr Diastolic CHF, & 7/11 Asc ThorAo Dissection w/ severe AI & Lmain lesion==> s/p repair of dissecting aneurysm & CABGx2... She knows to take Amox before dental work;  known severe right heart failure w/  severe TR & no surg option> being managed by DrCooper for Cards w/ freq f/u visits but she is miserable...     AFib> on Coumadin in CC, off Amio (intol), followed by DrCooper for Cards but not many options here...     CHOL> on Crestor5Qod & her FLP looks good (see below), but she wonders about her weakness & wants to stop for awhile...     DM> Borderline DM on diet alone & labs look ok w/ BS=122, A1c=7.1; continue diet (no sweets) + no meds at present...     Hx esophagitis & stricture> followed by DrDBrodie on Prilosec20Bid; last EGD 3/11ndida (Rx'd); denies heartburn, pain, dysphagia, etc...  IBS w/ constip & Rectocele> also followed by DrGottsegen; on stool softeners but she has trouble w/ evacuation & they are aware...     Left flank discomfort> she also sees Urology (prev DrKimbrough) & has f/u appt pending...     DJD/ LBP/ FM> she is actually c/o "corns" on her feet that make walking difficult she says; she will try OTC pads & if not improved she will call Podiatry...     Neuropathy> followed by Autumn Patty on Neurontin300Bid & Tramadol Prn...     Anxiety/ Depression> on Zoloft50, Tranxene7.5 Prn (ave~1/d she says), & Ambien10Qhs...  ANEMIA>  Iron is low & needs oral supplement to start.Marland KitchenMarland Kitchen

## 2012-03-08 ENCOUNTER — Telehealth: Payer: Self-pay | Admitting: Pulmonary Disease

## 2012-03-08 NOTE — Telephone Encounter (Signed)
FeSO4 325mg  Bid & take w/ VitC500   i made pt aware of recs. She voiced her understanding and needed nothing further

## 2012-03-10 ENCOUNTER — Telehealth: Payer: Self-pay | Admitting: Pulmonary Disease

## 2012-03-10 ENCOUNTER — Ambulatory Visit (INDEPENDENT_AMBULATORY_CARE_PROVIDER_SITE_OTHER): Payer: Medicare Other | Admitting: *Deleted

## 2012-03-10 DIAGNOSIS — Z8679 Personal history of other diseases of the circulatory system: Secondary | ICD-10-CM

## 2012-03-10 DIAGNOSIS — I4891 Unspecified atrial fibrillation: Secondary | ICD-10-CM

## 2012-03-10 LAB — POCT INR: INR: 2.5

## 2012-03-10 NOTE — Telephone Encounter (Signed)
Spoke with pt. She is concerned that the iron that SN rec is going to make her constipated. I advised that she can try increasing fiber intake, drink plenty of water and take her miralx as directed. Pt verbalized understanding and states that she will go ahead and start taking the iron and if having any problems give [place] a call.

## 2012-03-13 ENCOUNTER — Other Ambulatory Visit: Payer: Self-pay | Admitting: Pulmonary Disease

## 2012-03-13 DIAGNOSIS — Z1231 Encounter for screening mammogram for malignant neoplasm of breast: Secondary | ICD-10-CM

## 2012-03-14 ENCOUNTER — Ambulatory Visit: Payer: Medicare Other | Admitting: Cardiovascular Disease

## 2012-03-16 ENCOUNTER — Ambulatory Visit (INDEPENDENT_AMBULATORY_CARE_PROVIDER_SITE_OTHER): Payer: Medicare Other | Admitting: *Deleted

## 2012-03-16 ENCOUNTER — Other Ambulatory Visit: Payer: Medicare Other

## 2012-03-16 DIAGNOSIS — I4891 Unspecified atrial fibrillation: Secondary | ICD-10-CM

## 2012-03-16 DIAGNOSIS — Z8679 Personal history of other diseases of the circulatory system: Secondary | ICD-10-CM

## 2012-03-17 ENCOUNTER — Encounter: Payer: Self-pay | Admitting: Gynecology

## 2012-03-17 ENCOUNTER — Ambulatory Visit (INDEPENDENT_AMBULATORY_CARE_PROVIDER_SITE_OTHER): Payer: Medicare Other | Admitting: Gynecology

## 2012-03-17 ENCOUNTER — Other Ambulatory Visit: Payer: Self-pay | Admitting: Gynecology

## 2012-03-17 DIAGNOSIS — N760 Acute vaginitis: Secondary | ICD-10-CM

## 2012-03-17 DIAGNOSIS — N762 Acute vulvitis: Secondary | ICD-10-CM

## 2012-03-17 MED ORDER — DOXYCYCLINE HYCLATE 50 MG PO CAPS: 50.0000 mg | ORAL_CAPSULE | Freq: Two times a day (BID) | ORAL | Status: AC

## 2012-03-17 MED ORDER — SULFAMETHOXAZOLE-TMP DS 800-160 MG PO TABS: 1.0000 | ORAL_TABLET | Freq: Two times a day (BID) | ORAL | Status: DC

## 2012-03-17 NOTE — Progress Notes (Signed)
Patient presents complaining of left labial swelling and tenderness over the last several days. She has a history of this occurring in the past and has attributed to infected hair follicles and usually treats it with sitz baths. She started sitz baths yesterday and notes that it seems to started draining this morning. It seems a little less tender. No fever chills or other constitutional symptoms.  Exam with Selena Batten chaperone present External with pea-sized draining swelling left lower lateral labia majora with surrounding erythema. On probing the opening the whitish material which initially was thought to be pus actually is more sebaceous type material. I was able to evacuate the small cavity and I cultured the cavity and then subsequently he regained it with Betadine. She has no inguinal adenopathy and no other lesions externally. Digital vaginal exam was normal with atrophic changes.  Assessment and plan: Infected sebaceous cyst with surrounding early cellulitis. Now draining and evacuated with a Q-tip size opening. She does have a history of MRSA of the sinuses previously. Am going to cover her with doxycycline 100 mg twice a day x10 days. I feel this is probably the best choice antibiotic wise for MRSA coverage given that she is on Coumadin and I think Septra would have a greater effect on the Coumadin. I did ask her to check with her anticoagulation clinic to see if any dose adjustment with her Coumadin is needed or a recheck of her INR. She was asked to return if this area does not completely resolve.

## 2012-03-17 NOTE — Patient Instructions (Addendum)
Sitz baths 4 times daily.  Start antibiotics as soon as possible.  Check with your Coumadin doctor as far as possible dose adjustment as antibiotics can make you bleed easier. Return if swelling persists or worsens.

## 2012-03-21 ENCOUNTER — Ambulatory Visit (INDEPENDENT_AMBULATORY_CARE_PROVIDER_SITE_OTHER): Payer: Medicare Other | Admitting: Cardiovascular Disease

## 2012-03-21 ENCOUNTER — Other Ambulatory Visit: Payer: Medicare Other

## 2012-03-21 ENCOUNTER — Encounter: Payer: Self-pay | Admitting: Cardiovascular Disease

## 2012-03-21 VITALS — BP 118/70 | HR 69 | Ht 67.0 in | Wt 153.1 lb

## 2012-03-21 DIAGNOSIS — I4891 Unspecified atrial fibrillation: Secondary | ICD-10-CM

## 2012-03-21 DIAGNOSIS — I509 Heart failure, unspecified: Secondary | ICD-10-CM

## 2012-03-21 DIAGNOSIS — I079 Rheumatic tricuspid valve disease, unspecified: Secondary | ICD-10-CM

## 2012-03-21 DIAGNOSIS — I071 Rheumatic tricuspid insufficiency: Secondary | ICD-10-CM

## 2012-03-21 LAB — WOUND CULTURE: Organism ID, Bacteria: NORMAL

## 2012-03-21 NOTE — Patient Instructions (Signed)
Your physician recommends that you schedule a follow-up appointment in:6 weeks.   Your physician recommends that you return for lab work in: 6 weeks (a few days prior to appt with Dr. Excell Seltzer)  BMP

## 2012-03-22 ENCOUNTER — Ambulatory Visit
Admission: RE | Admit: 2012-03-22 | Discharge: 2012-03-22 | Disposition: A | Discharge status: Home / Self Care | Payer: Medicare Other | Source: Ambulatory Visit | Attending: Thoracic Surgery (Cardiothoracic Vascular Surgery) | Admitting: Thoracic Surgery (Cardiothoracic Vascular Surgery)

## 2012-03-22 DIAGNOSIS — I7101 Dissection of thoracic aorta: Secondary | ICD-10-CM

## 2012-03-22 MED ORDER — IOHEXOL 350 MG/ML SOLN
100.0000 mL | Freq: Once | INTRAVENOUS | Status: AC | PRN
  Administered 2012-03-22: 100 mL via INTRAVENOUS

## 2012-03-23 ENCOUNTER — Encounter: Payer: Self-pay | Admitting: Cardiovascular Disease

## 2012-03-23 ENCOUNTER — Other Ambulatory Visit: Payer: Self-pay | Admitting: Pulmonary Disease

## 2012-03-23 ENCOUNTER — Telehealth: Payer: Self-pay | Admitting: *Deleted

## 2012-03-23 ENCOUNTER — Other Ambulatory Visit: Payer: Self-pay | Admitting: Thoracic Surgery (Cardiothoracic Vascular Surgery)

## 2012-03-23 NOTE — Telephone Encounter (Signed)
Pt was seen on 03/17/12 with Infected sebaceous cyst, given doxycycline 100 mg twice a day x10 day. Pt said cyst is still there, it has went down some, not big as seen in OV, no pain, yellowish drainage, no blood, still doing sitz bath as directed. White head still on cyst.Pt is leaving town on Monday & last dose of antibody is tomorrow. Pt wanted to know if she needs more medication. Please advise

## 2012-03-23 NOTE — Telephone Encounter (Signed)
Pt informed with the below note, pt will wait until she returns from out of town to make follow up visit.

## 2012-03-23 NOTE — Progress Notes (Signed)
HPI:  77 year old woman presenting for followup evaluation. The patient has a history of remote surgical ASD repair, type A aortic dissection with surgical repair and concomitant coronary bypass surgery. She has developed chronic right heart failure with severe tricuspid regurgitation. At the time of her last visit, I tried her on Amiodarone with consideration for cardioversion as I suspected atrial fibrillation was exacerbating her symptoms of CHF. Unfortunately she was unable to tolerate amiodarone even at low dose.   She reports no change in her symptoms. She continues to have exertional dyspnea with low-level activity. She does not have orthopnea, PND, or resting dyspnea. She is a little better than she was a few months ago and she can always do her daily activities. She is looking forward to going to the beach with her husband in a group from church in the near future. She denies leg swelling, syncope, or chest pain. She does admit to outpatient at rest. Her weight has been stable at home.  Outpatient Encounter Prescriptions as of 03/21/2012  Medication Sig Dispense Refill  . acetaminophen (TYLENOL) 325 MG tablet Take 650 mg by mouth every 6 (six) hours as needed. For pain      . beta carotene w/minerals (OCUVITE) tablet Take 1 tablet by mouth 2 (two) times daily.        . cholecalciferol (VITAMIN D) 1000 UNITS tablet Take 1,000 Units by mouth daily.       Marland Kitchen doxycycline (VIBRAMYCIN) 50 MG capsule Take 1 capsule (50 mg total) by mouth 2 (two) times daily.  20 capsule  0  . furosemide (LASIX) 80 MG tablet Take 80 mg by mouth 2 (two) times daily.      Marland Kitchen gabapentin (NEURONTIN) 300 MG capsule TAKE 2 CAPSULES BY MOUTH AT BEDTIME  60 capsule  4  . metolazone (ZAROXOLYN) 2.5 MG tablet Take One Tablet By Mouth 30 Minutes Prior To Furosemide Dose Every other day      . metoprolol (LOPRESSOR) 50 MG tablet Take 50 mg by mouth 2 (two) times daily.      . potassium chloride SA (K-DUR,KLOR-CON) 20 MEQ tablet  Take 4 tablets by mouth daily alternating with 5 tablets by mouth daily.      . sertraline (ZOLOFT) 50 MG tablet Take 50 mg by mouth every morning.      . traMADol (ULTRAM) 50 MG tablet Take 1 tablet (50 mg total) by mouth 3 (three) times daily as needed. For pain  90 tablet  3  . warfarin (COUMADIN) 5 MG tablet Take 5 mg by mouth as directed.      Marland Kitchen DISCONTD: omeprazole (PRILOSEC) 20 MG capsule TAKE 1 CAPSULE BY MOUTH  TWICE A DAY 30 MINUTES BEFORE MEALS  60 capsule  0  . rosuvastatin (CRESTOR) 5 MG tablet Take 5 mg by mouth every other day. HOLD        Allergies  Allergen Reactions  . Amiodarone     Severe side effects per Pt--  . Atorvastatin     REACTION: muscle pain  . Codeine     REACTION: itching  . Erythromycin   . Ezetimibe     REACTION: INTOL to Zetia w/ cough  . Meperidine Hcl     REACTION: dizziness  . Morphine   . Neomycin-Bacitracin Zn-Polymyx   . Ropinirole Hydrochloride     REACTION: INTOL to Requip w/ sleep paralysis  . Simvastatin     REACTION: unable to walk--muscle pain    Past Medical History  Diagnosis Date  . OSA (obstructive sleep apnea)   . HTN (hypertension)   . Atrial fibrillation     paroxysmal; on coumadin  . CAD (coronary artery disease)     a. cath 7/11: LM 40%, mild plaque disease in CFX, LAD, and RCA;  b. CABG with S-LAD and S-OM done at time of Aortic dissection repair  . Diastolic heart failure   . Dissection of aorta, thoracic     a. Type A; s/p repair 7/11 with aortic root repair and CABG x 2   . ASD (atrial septal defect)     a. s/p repair 1982 at [facility]  . Right heart failure     a. 2/2 TR and RV dysfxn;  b. echo 4/12: EF 60%, mild LVH, mild AI, mild MR, mod LAE, mod RVE with mod dec. RVSF, mod RAE, mod to severe TR, PASP 58;  c. right heart cath 4/12:  RA mean 8, RV 46/1 with mean 6, PA 45/13 with mean 26, PCWP mean 14, CO 3.68, CI 2.1 (no sig pulmon HTN)  . GERD (gastroesophageal reflux disease)   . IBS (irritable bowel syndrome)     . Esophageal stricture   . Colonic polyp   . Peripheral neuropathy   . Back pain   . Fibromyalgia   . Anxiety   . HLD (hyperlipidemia)   . Borderline diabetes   . History of TIAs   . DJD (degenerative joint disease)   . Normocytic anemia   . Thrombocytopenia   . Macular degeneration   . Aneurysm 2011    Disecting aortic aneurysm  . Depression   . Atrial septal defect   . Esophageal dysmotilities     ROS: Negative except as per HPI  BP 118/70  Pulse 69  Ht 5\' 7"  (1.702 m)  Wt 69.455 kg (153 lb 1.9 oz)  BMI 23.98 kg/m2  PHYSICAL EXAM: Pt is alert and oriented, NAD HEENT: normal Neck: JVP - giant V waves, carotids 2+= without bruits Lungs: CTA bilaterally CV: irregularly irregular with 3/6 systolic murmur at the LSB Abd: soft, NT, Positive BS, hepatomegaly with pulsatile liver Ext: no C/C/E, distal pulses intact and equal Skin: warm/dry no rash  EKG:  Atrial fibrillation with diffuse ST and T wave abnormality, heart rate 69 beats per minute  ASSESSMENT AND PLAN: #1. Chronic right-sided heart failure with severe tricuspid regurgitation. Patient will continue on her same medical regimen. She is on a combination of metolazone and furosemide for diuretic therapy. Her creatinine has been watched closely and I will repeat it before her next office visit in 4-6 weeks.  #2. Atrial fibrillation. She was intolerant to amiodarone. I don't think we have any other reasonable options especially considering her severe tricuspid regurgitation and dilated atria. The continue anticoagulation with warfarin.  #3. The coronary artery disease status post CABG. The CABG was done emergently at the time of surgical repair of her acute Type A dissection. She has no angina.  Tonny Bollman 03/23/2012 11:56 PM

## 2012-03-23 NOTE — Telephone Encounter (Signed)
No, as long as it feels better I would just continue with warm soaks. I do recommend a follow up office visits we can look at it in a noninflamed state. She wants to come in tomorrow before she leaves town I can take a look at it.

## 2012-03-24 ENCOUNTER — Ambulatory Visit (INDEPENDENT_AMBULATORY_CARE_PROVIDER_SITE_OTHER): Payer: Medicare Other | Admitting: *Deleted

## 2012-03-24 DIAGNOSIS — I4891 Unspecified atrial fibrillation: Secondary | ICD-10-CM

## 2012-03-24 DIAGNOSIS — Z8679 Personal history of other diseases of the circulatory system: Secondary | ICD-10-CM

## 2012-03-24 LAB — POCT INR: INR: 3

## 2012-03-27 ENCOUNTER — Ambulatory Visit: Payer: Medicare Other | Admitting: Thoracic Surgery (Cardiothoracic Vascular Surgery)

## 2012-03-27 ENCOUNTER — Other Ambulatory Visit: Payer: Medicare Other

## 2012-04-05 ENCOUNTER — Ambulatory Visit (INDEPENDENT_AMBULATORY_CARE_PROVIDER_SITE_OTHER)
Admission: RE | Admit: 2012-04-05 | Discharge: 2012-04-05 | Disposition: A | Discharge status: Home / Self Care | Payer: Medicare Other | Source: Ambulatory Visit | Attending: Pulmonary Disease | Admitting: Pulmonary Disease

## 2012-04-05 ENCOUNTER — Other Ambulatory Visit (INDEPENDENT_AMBULATORY_CARE_PROVIDER_SITE_OTHER): Payer: Medicare Other

## 2012-04-05 ENCOUNTER — Encounter: Payer: Self-pay | Admitting: Pulmonary Disease

## 2012-04-05 ENCOUNTER — Ambulatory Visit (INDEPENDENT_AMBULATORY_CARE_PROVIDER_SITE_OTHER): Payer: Medicare Other | Admitting: Pulmonary Disease

## 2012-04-05 VITALS — BP 114/72 | HR 61 | Temp 97.3°F | Ht 67.0 in | Wt 155.8 lb

## 2012-04-05 DIAGNOSIS — I5033 Acute on chronic diastolic (congestive) heart failure: Secondary | ICD-10-CM

## 2012-04-05 DIAGNOSIS — IMO0001 Reserved for inherently not codable concepts without codable children: Secondary | ICD-10-CM

## 2012-04-05 DIAGNOSIS — F341 Dysthymic disorder: Secondary | ICD-10-CM

## 2012-04-05 DIAGNOSIS — M1990 Unspecified osteoarthritis, unspecified site: Secondary | ICD-10-CM

## 2012-04-05 DIAGNOSIS — G609 Hereditary and idiopathic neuropathy, unspecified: Secondary | ICD-10-CM

## 2012-04-05 DIAGNOSIS — R7309 Other abnormal glucose: Secondary | ICD-10-CM

## 2012-04-05 DIAGNOSIS — K222 Esophageal obstruction: Secondary | ICD-10-CM

## 2012-04-05 DIAGNOSIS — W19XXXA Unspecified fall, initial encounter: Secondary | ICD-10-CM

## 2012-04-05 DIAGNOSIS — E7800 Pure hypercholesterolemia, unspecified: Secondary | ICD-10-CM

## 2012-04-05 DIAGNOSIS — I4891 Unspecified atrial fibrillation: Secondary | ICD-10-CM

## 2012-04-05 DIAGNOSIS — I10 Essential (primary) hypertension: Secondary | ICD-10-CM

## 2012-04-05 DIAGNOSIS — M545 Low back pain: Secondary | ICD-10-CM

## 2012-04-05 DIAGNOSIS — K589 Irritable bowel syndrome without diarrhea: Secondary | ICD-10-CM

## 2012-04-05 DIAGNOSIS — I872 Venous insufficiency (chronic) (peripheral): Secondary | ICD-10-CM

## 2012-04-05 DIAGNOSIS — D62 Acute posthemorrhagic anemia: Secondary | ICD-10-CM

## 2012-04-05 DIAGNOSIS — I7101 Dissection of thoracic aorta: Secondary | ICD-10-CM

## 2012-04-05 LAB — CBC WITH DIFFERENTIAL/PLATELET
Basophils Relative: 0.7 % (ref 0.0–3.0)
Eosinophils Absolute: 0 10*3/uL (ref 0.0–0.7)
Hemoglobin: 11.1 g/dL — ABNORMAL LOW (ref 12.0–15.0)
Lymphs Abs: 0.7 10*3/uL (ref 0.7–4.0)
MCHC: 30.3 g/dL (ref 30.0–36.0)
MCV: 89.8 fl (ref 78.0–100.0)
Monocytes Absolute: 0.5 10*3/uL (ref 0.1–1.0)
Neutro Abs: 3.1 10*3/uL (ref 1.4–7.7)
RBC: 4.09 Mil/uL (ref 3.87–5.11)

## 2012-04-05 LAB — BASIC METABOLIC PANEL
CO2: 37 mEq/L — ABNORMAL HIGH (ref 19–32)
Chloride: 98 mEq/L (ref 96–112)
Creatinine, Ser: 0.8 mg/dL (ref 0.4–1.2)
Sodium: 144 mEq/L (ref 135–145)

## 2012-04-05 NOTE — Patient Instructions (Signed)
Today we updated your med list in our EPIC system...    Continue your current medications the same for now...  Today we did your follow up screening blood work & we [facility] your left hip & right ribs...    We will call you w/ the results ASAP...  Let's try outpt physical therapy for strengthening, walking, & balance help...  Call if we can be of service in any way...    Let's plan a follow up visit in 2 months.Marland KitchenMarland Kitchen

## 2012-04-05 NOTE — Progress Notes (Deleted)
Subjective:    Patient ID: [name], female    DOB: Aug 02, 1935, 77 y.o.   MRN: [id number]  HPI 77 y/o WF here for a follow up visit... she has multiple medical problems as noted below...     Followed by DrCooper for Cards- hx ASD repair 1982 & dilated AoRoot;  HBP & PAF;  then Aortic dissection w/ emergency surg 7/11 by DrOwen w/ CABG x2 and miraculous recovery...   Followed by DrDBrodie for GI- hx dysphagia & esoph strictures dilated, also hx candida esoph... also prob w/ constip part related to rectocele...  ~  September 04, 2010:  She developed sudden left sided back & chest pain 06/04/10 w/ signs of ischemia & taken to cath lab w/ AoDissection found along w/ Lmain disease- 11hr emergency operation by DrOwen w/ repair of aortic dissection & CABG x2 (SVG to LAD, & SVG to obtuse marg branch of Circ) was required to get her off the bypass machine... she made a miraculous recovery- followed closely by DrOwen & DrCooper w/ meds as below... getting stonger w/ home health rehab, etc... some recent incr trouble w/ fluid retention & they have adjusted diuretics... XRays & labs reviewed... I have offered assist in any way needed- she requests refill Rxs to [facility].  ~  September 07, 2011:  1 year ROV & she has had a lot going on >> long prob list & multisystem disease w/ mult specialists involved in her care...    Abn CXR> s/p AscAo dissection repair & bilat apical schwannomas (see CXR & CTA report); O2sat= 91% on RA & advised incr exercise at home...    HBP> on Metoprolol50Bid, Lasix80Bid, Zaroxyln2.5 2d/wk, K20Tid- ;  BP= 114/68 & labs showed K= 3.2; advised incr K20 to 2Bid everyday...    CARDIAC> followed by DrCooper & DrOwens w/ long complic cardiac hx> ASD secundum repair at [facility], CAD, PAF, Ac on Chr Diastolic CHF, & 7/11 Asc ThorAo Dissection w/ severe AI & Lmain lesion==> s/p repair of dissecting aneurysm & CABGx2... She knows to take Amox before dental work.    PAF> on ASA81 +  Coumadin in CC, off Amio, followed by DrCooper for Cards    CHOL> on Crestor5 & her FLP looks good (see below)...    DM> borderline DM on diet alone & labs look good; continue diet + exercise...    Hx esophagitis & stricture> followed by DrDBrodie on Prilosec20Bid; last EGD 3/11ndida (Rx'd); denies heartburn, pain, dysphagia, etc...    IBS w/ constip & Rectocele> also followed by DrGottsegen; on stool softeners but she has trouble w/ evacuation & they are aware...    Left flank discomfort> she also sees Urology (prev DrKimbrough) & has f/u appt pending...    DJD/ LBP/ FM> she is actually c/o "corns" on her feet that make walking difficult she says; she will try OTC pads & if not improved she will call Podiatry...    Neuropathy> followed by Autumn Patty on Neurontin300Bid & Tramadol Prn...    Anxiety/ Depression> on Zoloft50, Tranxene7.5 Prn (ave~1/d she says), & [id number];   ~  March 07, 2012:  63mo ROV   ~  Apr 05, 2012:  69mo ROV          Problem List:  OBSTRUCTIVE SLEEP APNEA (ICD-327.23) - sleep study 9/06 by Breck Coons showed RDI=25/hr during REM w/ desat to 77%...  ABN CXR w/ left superior mediastinal opacity ?etiology- no change back to 2006 films= benign... ~  CXR 2/11 showed cardiomeg,  ectatic Ao, left superior mediast soft tissue prom w/o change (?ectasia of great vessels or benign mass). ~  CXR 9/11 s/p median sternotomy, dissection repair,  & CABG- cardiomeg, bilat effusions & basilar atx, no ch in left superior lesion... ~  CXR 4/12 showed med sternotomy, cardiomeg, hyerinflation/ scarring, some vasc congestion... ~  CTAngio 5/12 showed stable asc ao dissection repair, rounded soft tissues masses both apicies L>R are prob neural tumors (schwannomas)...  HYPERTENSION (ICD-401.9) - controlled on METOPROLOL 50mg Bid, FUROSEMIDE 80mg /d, & KCL 26mEq/d ...  BP=110/70 and she denies HA, visual changes, CP, palipit, syncope... she is fatigued, SOB/ DOE, & +edema... getting PT/ rehab therapy &  improving slowly...  PAROXYSMAL ATRIAL FIBRILLATION (ICD-427.31) - on COUMADIN followed in the CC, & AMIODARONE 200mg /d... followed by Drcooper for Cards.  CORONARY ARTERY DISEASE (ICD-414.00) - on ASA 81mg /d ACUTE ON CHRONIC DIASTOLIC HEART FAILURE (ICD-428.33) - on Lasix, Zaroxyln, & KCl... Hx of DISSECTING AORTIC ANEURYSM THORACIC (ICD-441.01) w/ surg 7/11 by DrOwen  Hx of ATRIAL SEPTAL DEFECT (ICD-745.5) - s/p ASD secundum repaired at [facility] in 1982... long-time pt of DrJoeLeBauer and now DrCooper> ~  Cardiolite 5/06 showed no ischemia & EF=65%...  ~  2DEcho 12/07 showed norm LVF, EF=55%, no regional wall motion abn, & RA/RV= mod dil... ~  Cardiac MRI 3/09 showed mod Ao root enlargement 4.3cm, norm AoV/ Arch/ Desc Ao, mod RV & biatrial enlargement from the prev ASD repair (no resid leak), mod PA enlargement, norm LV w/ EF=59%... ~  [facility] 9/09 w/ MI ruled-out, atyp CP (prob esoph spasm) & Myoview was neg- no scar or ischemia, EF= 77%... ~  2DEcho 9/10 showed sl incr LV wall thikness c/w mild LVH, norm wall motion, EF= 55-60%, gr2 DD, mild AI, mod dil LA/RA, mod-severe TR, mild pulmHTN... ~  Emergency hosp 7/11 w/ Aortic dissection, severe AI, severe TR & cath w/ 40%Lmain lesion- s/p repair & required CABG x2 as well... ~  DrOwen does CTAngiograms every 86mo... ~  [facility] 4/12 by Cards for CHF from severe TR & right heart disease> SEE 2DEcho & Right heart cath data> reviewed... ~  She saw DrCooper 10/12> felt to be reasonably stable, holding NSR, on Coumadin via CC, & f/u 2DEcho ordered & pending... ~  Labs 10/12 showed K=3.2 on K20Tid+4MTh; TCO2=37, BUN=28, Creat=0.8; pt instructed to incr K20 to [facility] everyday...  VENOUS INSUFFICIENCY (ICD-459.81) & EDEMA (ICD-782.3) - she has chr VI changes, superficial VV, and incr edema since surg 7/11... ~  3/09 ABI's done and were WNL...  HYPERCHOLESTEROLEMIA (ICD-272.0) - on CRESTOR 5mg /d; she has blamed Zetia for cough in the past... ~  FLP 7/08 showing  TChol 143, TG 173, HDL 32, LDL 76. ~  FLP 4/09 showed TChol 172, Tg 161, HDL 38, LDL 102 ~  FLP 7/09 showed TChol 167. Tg 148, HDL 40, LDL 98... rec- continue same. ~  FLP 1/10 showed TChol 177, TG 85, HDL 43, LDL 117 ~  FLP 2/11 showed TChol 160, TG 83, HDL 50, LDL 94 ~  FLP 10/12 on Cres5 showed TChol 150, TG 81, HDL 52, LDL 82  DIABETES MELLITUS, BORDERLINE (ICD-790.29) - on diet Rx alone... ~  Hx borderline BS w/ FBS=119 in 7/08 & HgA1c=6.1 on diet alone. ~  labs 4/09 showed BS= 120, HgA1c= 6.0.Marland KitchenMarland Kitchen continue diet rx. ~  labs 7/09 showed BS= 132 ~  labs 1/10 showed BS= 120, A1c= 6.1 ~  labs 2/11 showed BS= 106 ~  Labs 10/12 showed BS= 121, A1c=  6.3... Ok on diet Rx.  Hx of ESOPHAGEAL STRICTURE (ICD-530.3) - last EGD 6/04 by DrDBrodie w/ esophagitis, hx gastritis... 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 treated w/ PRILOSEC 20mg Bid... ~  [facility] Sep09 w/ atyp CP felt to be esoph spasm... GI f/u DrDBrodie w/ EGD Sep09= candida esoph, nonobstructing esoph stricture dilated. ~  2/11: presents w/ incr dysphagia & f/u GI w/ Ba Esophagram-mild stricture;  EGD 3/11 showed candida esophagitis (Rx'd);  Sonar- no gallstones, WNL.Marland KitchenMarland Kitchen  IRRITABLE BOWEL SYNDROME, HX OF (ICD-V12.79) - colonoscopy was 6/04 by DrDBrodie= neg without polyps etc... f/u 9/09 was negative- no recurrent polyps... f/u planned 9/14... ~  she reports trouble w/ constip and hx recurrent rectocele- initial surg 1987, now sees DrGottsegen for GYN & may need additional surg... ~  She saw DrDBrodie 9/12> sm vol intermit painless rectal bleeding from anal fissures; rectocele, constip, difficulty evacuating; on stool softeners & Nupercainal oint.  UROLOGY> followed by DrKimbrough> Hx microhematuria, renal cyst, duplication & dilatation on the left, left sided pain off & on...  DEGENERATIVE JOINT DISEASE (ICD-715.90)  BACK PAIN, LUMBAR (ICD-724.2)  FIBROMYALGIA (ICD-729.1)  PERIPHERAL  NEUROPATHY (ICD-356.9) - sl worse per pt and DrLove follows... she takes NEURONTIN 300mg Bid and refuses other meds... 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  ?TIA - hosp 11/07 by Autumn Patty w/ ?TIA vs sleep paralysis episode... MRIBrain w/ sm vessel dis and atherosclerotic changes... she still complains of "weak spells, my whole body gets weak in the afternoons"... these episodes last ~32min, resolve spont w/ rest or ?better after eating... she is convinced that the sleep paralysis episodes were caused by the Requip med...  ANXIETY DEPRESSION (ICD-300.4) - she states that "my nerves are shot" w/ stress from son's alcoholism & divorce, plus her husb's illness etc... started ZOLOFT 50mg /d 7/10 & improved...  Health Maintenance: ~  GYN= DrGottsegen w/ Mammograms at [facility] (last 4/10 w/ ultrasound f/u)... Gyn does BMD's and she reports it was normal several yrs ago, not on calcium, MVI, etc... ~  Immunizations:  she reports 2010 Flu vaccine & Pneumonia shot in 2010... cna't recall last Tetanus.   Past Medical History  Diagnosis Date  . OSA (obstructive sleep apnea)   . HTN (hypertension)   . Atrial fibrillation     paroxysmal; on coumadin  . CAD (coronary artery disease)     a. cath 7/11: LM 40%, mild plaque disease in CFX, LAD, and RCA;  b. CABG with S-LAD and S-OM done at time of Aortic dissection repair  . Diastolic heart failure   . Dissection of aorta, thoracic     a. Type A; s/p repair 7/11 with aortic root repair and CABG x 2   . ASD (atrial septal defect)     a. s/p repair 1982 at [facility]  . Right heart failure     a. 2/2 TR and RV dysfxn;  b. echo 4/12: EF 60%, mild LVH, mild AI, mild MR, mod LAE, mod RVE with mod dec. RVSF, mod RAE, mod to severe TR, PASP 58;  c. right heart cath 4/12:  RA mean 8, RV 46/1 with mean 6, PA 45/13 with mean 26, PCWP mean 14, CO 3.68, CI 2.1 (no sig pulmon HTN)  . GERD  (gastroesophageal reflux disease)   . IBS (irritable bowel syndrome)   . Esophageal stricture   .  Colonic polyp   . Peripheral neuropathy   . Back pain   . Fibromyalgia   . Anxiety   . HLD (hyperlipidemia)   . Borderline diabetes   . History of TIAs   . DJD (degenerative joint disease)   . Normocytic anemia   . Thrombocytopenia   . Macular degeneration   . Aneurysm 2011    Disecting aortic aneurysm  . Depression   . Atrial septal defect   . Esophageal dysmotilities     Past Surgical History  Procedure Date  . Asd repair 1982    [facility]  . Rotator cuff repair 2007    right  . Cardioversion     x 3  . Tubal ligation   . Appendectomy   . Vaginal hysterectomy 1987    A/P Repair With Cystocele and rectocele repair  . Tonsillectomy   . Oophorectomy 1994    BSO  . Pelvic laparoscopy 1994  . Emergency redo median sternotomy for hemiarch repair of acute type a aortic  dissection 06/05/2010  . Nasal hemorrhage control 12/27/2011    Procedure: EPISTAXIS CONTROL;  Surgeon: Melvenia Beam, MD;  Location: [facility] OR;  Service: ENT;  Laterality: N/A;    Outpatient Encounter Prescriptions as of 04/05/2012  Medication Sig Dispense Refill  . acetaminophen (TYLENOL) 325 MG tablet Take 650 mg by mouth every 6 (six) hours as needed. For pain      . beta carotene w/minerals (OCUVITE) tablet Take 1 tablet by mouth 2 (two) times daily.        . cholecalciferol (VITAMIN D) 1000 UNITS tablet Take 1,000 Units by mouth daily.       . furosemide (LASIX) 80 MG tablet Take 80 mg by mouth 2 (two) times daily.      Marland Kitchen gabapentin (NEURONTIN) 300 MG capsule TAKE 2 CAPSULES BY MOUTH AT BEDTIME  60 capsule  4  . metolazone (ZAROXOLYN) 2.5 MG tablet Take One Tablet By Mouth 30 Minutes Prior To Furosemide Dose Every other day      . metoprolol (LOPRESSOR) 50 MG tablet Take 50 mg by mouth 2 (two) times daily.      Marland Kitchen omeprazole (PRILOSEC) 20 MG capsule TAKE 1 CAPSULE BY MOUTH  TWICE A DAY 30 MINUTES BEFORE MEALS  60  capsule  0  . potassium chloride SA (K-DUR,KLOR-CON) 20 MEQ tablet Take 4 tablets by mouth daily alternating with 5 tablets by mouth daily.      . rosuvastatin (CRESTOR) 5 MG tablet Take 5 mg by mouth every other day. HOLD      . sertraline (ZOLOFT) 50 MG tablet Take 50 mg by mouth every morning.      . traMADol (ULTRAM) 50 MG tablet Take 1 tablet (50 mg total) by mouth 3 (three) times daily as needed. For pain  90 tablet  3  . warfarin (COUMADIN) 5 MG tablet Take 5 mg by mouth as directed.        Allergies  Allergen Reactions  . Amiodarone     Severe side effects per Pt--  . Atorvastatin     REACTION: muscle pain  . Codeine     REACTION: itching  . Erythromycin   . Ezetimibe     REACTION: INTOL to Zetia w/ cough  . Meperidine Hcl     REACTION: dizziness  . Morphine   . Neomycin-Bacitracin Zn-Polymyx   . Ropinirole Hydrochloride     REACTION: INTOL to Requip w/ sleep paralysis  . Simvastatin  REACTION: unable to walk--muscle pain    Current Medications, Allergies, Past Medical History, Past Surgical History, Family History, and Social History were reviewed in [employer] record.    Review of Systems         See HPI - all other systems neg except as noted...  The patient complains of decreased hearing, dyspnea on exertion, and peripheral edema.  The patient denies anorexia, fever, weight loss, weight gain, vision loss, hoarseness, chest pain, syncope, prolonged cough, headaches, hemoptysis, abdominal pain, melena, hematochezia, severe indigestion/heartburn, hematuria, incontinence, muscle weakness, suspicious skin lesions, transient blindness, difficulty walking, depression, unusual weight change, abnormal bleeding, enlarged lymph nodes, and angioedema.    Objective:   Physical Exam    WD, Thin, 77 y/o WF in NAD... GENERAL:  Alert & oriented; pleasant & cooperative... HEENT:  [place]/AT, EOM-wnl, PERRLA, Fundi-benign, EACs-clear, TMs-wnl, NOSE-clear,  THROAT-clear & wnl... NECK:  Supple w/ fairROM; no JVD; normal carotid impulses w/o bruits; no thyromegaly or nodules palpated; no lymphadenopathy. CHEST:  Median sterotomy scar, decr BS at bases w/ few bibasilar rales... HEART:  Regular Rhythm;  gr 1/6 sys murmur, without rubs or gallops detected... s/p repair ASD & Ao dissection... ABDOMEN:  Soft & nontender; normal bowel sounds; no organomegaly or masses detected. EXT: without deformities, mild arthritic changes; no varicose veins/ +venous insuffic/ 3+ edema. NEURO:  CN's intact;  peripheral neuropathy without focal neuro deficits on exam... DERM:  No lesions noted; no rash etc...  RADIOLOGY DATA:  Reviewed in the EPIC EMR & discussed w/ the patient...  LABORATORY DATA:  Reviewed in the EPIC EMR & discussed w/ the patient...   Assessment & Plan:   Abn CXR> s/p AscAo dissection repair & bilat apical schwannomas (see CXR & CTA report); O2sat= 91% on RA & advised incr exercise at home...     HBP> on Metoprolol50Bid, Lasix80Bid, Zaroxyln2.5 2d/wk, K20Tid- ;  BP= 114/68 & labs showed K= 3.2; advised incr K20 to 2Bid everyday...     CARDIAC> followed by DrCooper & DrOwens w/ long complic cardiac hx> ASD secundum repair at [facility], CAD, PAF, Ac on Chr Diastolic CHF, & 7/11 Asc ThorAo Dissection w/ severe AI & Lmain lesion==> s/p repair of dissecting aneurysm & CABGx2... She knows to take Amox before dental work.     PAF> on ASA81 + Coumadin in CC, off Amio, holding NSR, followed by DrCooper for Cards     CHOL> on Crestor5 & her FLP looks good (see below)...     DM> borderline DM on diet alone & labs look good; continue diet + exercise...     Hx esophagitis & stricture> followed by DrDBrodie on Prilosec20Bid; last EGD 3/11ndida (Rx'd); denies heartburn, pain, dysphagia, etc...  IBS w/ constip & Rectocele> also followed by DrGottsegen; on stool softeners but she has trouble w/ evacuation & they are aware...     Left flank discomfort>  she also sees Urology (prev DrKimbrough) & has f/u appt pending...     DJD/ LBP/ FM> she is actually c/o "corns" on her feet that make walking difficult she says; she will try OTC pads & if not improved she will call Podiatry...     Neuropathy> followed by Autumn Patty on Neurontin300Bid & Tramadol Prn...     Anxiety/ Depression> on Zoloft50, Tranxene7.5 Prn (ave~1/d she says), & [id number];

## 2012-04-06 NOTE — Progress Notes (Signed)
Subjective:    Patient ID: [name], female    DOB: [birth year], 77 y.o.   MRN: [id number]  HPI 77 y/o WF here for a follow up visit... she has multiple medical problems as noted below...     Followed by DrCooper for Cards- hx ASD repair 1982 & dilated AoRoot;  HBP & PAF;  then Aortic dissection w/ emergency surg 7/11 by DrOwen w/ CABG x2 and miraculous recovery...   Followed by DrDBrodie for GI- hx dysphagia & esoph strictures dilated, also hx candida esoph... also prob w/ constip part related to rectocele...   Followed by [facility] & ENT at [facility] for severe epistaxis requiring mult surg in 2013 (see below)...  ~  September 04, 2010:  She developed sudden left sided back & chest pain 06/04/10 w/ signs of ischemia & taken to cath lab w/ AoDissection found along w/ Lmain disease- 11hr emergency operation by DrOwen w/ repair of aortic dissection & CABG x2 (SVG to LAD, & SVG to obtuse marg branch of Circ) was required to get her off the bypass machine... she made a miraculous recovery- followed closely by DrOwen & DrCooper w/ meds as below... getting stonger w/ home health rehab, etc... some recent incr trouble w/ fluid retention & they have adjusted diuretics... XRays & labs reviewed... I have offered assist in any way needed- she requests refill Rxs to [facility].  ~  September 07, 2011:  1 year ROV & she has had a lot going on >> long prob list & multisystem disease w/ mult specialists involved in her care...    Abn CXR> s/p AscAo dissection repair & bilat apical schwannomas (see CXR & CTA report); O2sat= 91% on RA & advised incr exercise at home...    HBP> on Metoprolol50Bid, Lasix80Bid, Zaroxyln2.5 2d/wk, K20Tid- ;  BP= 114/68 & labs showed K= 3.2; advised incr K20 to 2Bid everyday...    CARDIAC> followed by DrCooper & DrOwens w/ long complic cardiac hx> ASD secundum repair at [facility], CAD, PAF, Ac on Chr Diastolic CHF, & 7/11 Asc ThorAo Dissection w/ severe AI & Lmain lesion==> s/p  repair of dissecting aneurysm & CABGx2... She knows to take Amox before dental work.    PAF> on ASA81 + Coumadin in CC, off Amio, followed by DrCooper for Cards    CHOL> on Crestor5 & her FLP looks good (see below)...    DM> borderline DM on diet alone & labs look good; continue diet + exercise...    Hx esophagitis & stricture> followed by DrDBrodie on Prilosec20Bid; last EGD 3/11ndida (Rx'd); denies heartburn, pain, dysphagia, etc...    IBS w/ constip & Rectocele> also followed by DrGottsegen; on stool softeners but she has trouble w/ evacuation & they are aware...    Left flank discomfort> she also sees Urology (prev DrKimbrough) & has f/u appt pending...    DJD/ LBP/ FM> she is actually c/o "corns" on her feet that make walking difficult she says; she will try OTC pads & if not improved she will call Podiatry...    Neuropathy> followed by Autumn Patty on Neurontin300Bid & Tramadol Prn...    Anxiety/ Depression> on Zoloft50, Tranxene7.5 Prn (ave~1/d she says), & [id number];   ~  March 07, 2012:  69mo ROV & [name] has been thru a lot since last OV>> Hx Aortic replacement & AFib prev on Coumadin> Feb2013 developed severe epistaxis:    Treated for bilat severe epistaxis 12/27/11 w/ bilat endoscopic sphenopalatine ligation & packing by DrGore at [facility]; packing  removed 2/18 by DrShoemaker in his office...    Anemic due to acute blood loss> prev Hg= 10-11 range & was 7.7 when Tx to [facility]...    Re-adm 2/28 - 01/14/12 for recurrent bleeding from right nares- packed & attempted embolization was unsuccessful; transferred to [facility] by [facility] for further surg...    She had further surg by ENT at [facility] but we don't have any of these records or f/u note by ENT in [place]... She has had mult f/u visits w/ DrCooper for Cards> back on Coumadin for her AFib (intol to Amio & Coumadin carefully monitored by CC), right heart failure w/ severe TR & no surg option, extremely fatigued/ exhaused/ doing very poorly overall & she  feels she has not recovered from the ENT surg/ nose bleeding episodes... CXR 4/13 showed chr severe cardiomeg, prev CABG, chr pleural blunting but no acute effusions, post-op right axillary changes, NAD... LABS 4/13:  Chems- ok w/ K=4.0 BUN=42 Creat=1.1 BS=122 A1c=7.1;  CBC- Hg=9.1 Fe=18 (4%);  TSH=4.49;  B12=849;  PROTIME= WAY TOO THIN & referred to CC- stat...  ~  Apr 05, 2012:  24mo ROV> [name] remains very weak & is having difficulty at home- fell in [place] yest when husb was out, couldn't stand, crawled to cellphone & husb ret to help her up, bruised right lat ribs & left hip area (XRays today are neg);  She notes her walking is worse since her last surg & we discussed the need for PHYSICAL THERAPY- walking, strengthening, balance;  She has Neuro f/u w/ DrLove pending;  "My goal is to get in my car and GO and DO!" she says...    She saw DrCooper for Cards f/u earlier this month & there doesn't look like there is much else he can do;  F/u CTA 5/13 showed stable prox Ao graft- see extensive report by DrYamagata (reviewed)...    She saw DrFontaine for GYN f/u- c/o labial swelling & tenderness- infected seb cyst, drained, Rx Doxy Bid, & improved... CXR 5/13 showed stable cardiomegaly & median sternotomy, atherosclerotic calcif in AO, clear lungs, osteopenia, NAD... Right RIB FILMS are neg for any fractures... Left HIP FILM is neg for fx, pos for osteopenia... LABS 5/13:  Chems- ok x HCO3=37;  CBC- improved w/ Hg=11.1 Fe=132 (38%)          Problem List:  SEVERE EPISTAXIS >> see above & managed by DrBates et al + ENT at [facility]...  OBSTRUCTIVE SLEEP APNEA (ICD-327.23) - sleep study 9/06 by Breck Coons showed RDI=25/hr during REM w/ desat to 77%...  ABN CXR w/ left superior mediastinal opacity ?etiology- no change back to 2006 films= benign... ~  CXR 2/11 showed cardiomeg, ectatic Ao, left superior mediast soft tissue prom w/o change (?ectasia of great vessels or benign mass). ~  CXR 9/11 s/p median  sternotomy, dissection repair,  & CABG- cardiomeg, bilat effusions & basilar atx, no ch in left superior lesion... ~  CXR 4/12 showed med sternotomy, cardiomeg, hyerinflation/ scarring, some vasc congestion... ~  CTAngio 5/12 showed stable asc ao dissection repair, rounded soft tissues masses both apicies L>R are prob neural tumors (schwannomas)... ~  CXR 4/13 & 5/13 showed chr severe cardiomeg, prev CABG, chr pleural blunting but no acute effusions, post-op right axillary changes, NAD...  HYPERTENSION (ICD-401.9) - controlled on METOPROLOL 50mg Bid, + LASIX/ ZAROXYLN/ KCl...  ~  10/12:  BP=110/70 and she denies HA, visual changes, CP, palipit, syncope... she is fatigued, SOB/ DOE, & +edema... getting PT/ rehab  therapy & improving slowly... ~  4/13:  BP= 100/70 & she is very weak s/p epistaxis episodes 2/13 & 3/13 w/ surg here & at [facility]... ~  5/13:  BP= 100/70 & she is very weak...  ATRIAL FIBRILLATION (ICD-427.31) - on COUMADIN followed in the CC and very carefully monitored due to her severe epistaxis; managed by DrCooper for Cards & she is intol to Amiodarone...  CORONARY ARTERY DISEASE (ICD-414.00) - off ASA due to severe epistaxis episodes.. ACUTE ON CHRONIC DIASTOLIC HEART FAILURE (ICD-428.33) - on LASIX 80mg Bid, ZAROXYLN 2.5mg Qod, & KCl - taking 4-5-4-5 Qod... 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 long-time pt of DrJoeLeBauer and now DrCooper> ~  Cardiolite 5/06 showed no ischemia & EF=65%...  ~  2DEcho 12/07 showed norm LVF, EF=55%, no regional wall motion abn, & RA/RV= mod dil... ~  Cardiac MRI 3/09 showed mod Ao root enlargement 4.3cm, norm AoV/ Arch/ Desc Ao, mod RV & biatrial enlargement from the prev ASD repair (no resid leak), mod PA enlargement, norm LV w/ EF=59%... ~  [facility] 9/09 w/ MI ruled-out, atyp CP (prob esoph spasm) & Myoview was neg- no scar or  ischemia, EF= 77%... ~  2DEcho 9/10 showed sl incr LV wall thikness c/w mild LVH, norm wall motion, EF= 55-60%, gr2 DD, mild AI, mod dil LA/RA, mod-severe TR, mild pulmHTN... ~  Emergency hosp 7/11 w/ Aortic dissection, severe AI, severe TR & cath w/ 40%Lmain lesion- s/p repair & required CABG x2 as well... ~  DrOwen does CTAngiograms every 83mo... ~  [facility] 4/12 by Cards for CHF from severe TR & right heart disease> SEE 2DEcho & Right heart cath data> reviewed... ~  She saw DrCooper 10/12> felt to be reasonably stable, holding NSR, on Coumadin via CC, & f/u 2DEcho ordered & pending... ~  Labs 10/12 showed K=3.2 on K20Tid+4MTh; TCO2=37, BUN=28, Creat=0.8; pt instructed to incr K20 to [facility] everyday... ~  Serial labs avail in the results section... Labs 5/13 showed BUN=21, Creat=0.8, K=3.9  VENOUS INSUFFICIENCY (ICD-459.81) & EDEMA (ICD-782.3) - she has chr VI changes, superficial VV, and incr edema since surg 7/11... ~  3/09 ABI's done and were WNL...  HYPERCHOLESTEROLEMIA (ICD-272.0) - on CRESTOR 5mg Qod & she wants to STOP for awhile; she has blamed Zetia for cough in the past... ~  FLP 7/08 showing TChol 143, TG 173, HDL 32, LDL 76. ~  FLP 4/09 showed TChol 172, Tg 161, HDL 38, LDL 102 ~  FLP 7/09 showed TChol 167. Tg 148, HDL 40, LDL 98... rec- continue same. ~  FLP 1/10 showed TChol 177, TG 85, HDL 43, LDL 117 ~  FLP 2/11 showed TChol 160, TG 83, HDL 50, LDL 94 ~  FLP 10/12 on Cres5 showed TChol 150, TG 81, HDL 52, LDL 82 ~  4/13:  She wants to STOP the Cres5Qod for awhile since she is so weak...  DIABETES MELLITUS, BORDERLINE (ICD-790.29) - on diet Rx alone... ~  Hx borderline BS w/ FBS=119 in 7/08 & HgA1c=6.1 on diet alone. ~  labs 4/09 showed BS= 120, HgA1c= 6.0.Marland KitchenMarland Kitchen continue diet rx. ~  labs 7/09 showed BS= 132 ~  labs 1/10 showed BS= 120, A1c= 6.1 ~  labs 2/11 showed BS= 106 ~  Labs 10/12 showed BS= 121, A1c= 6.3.Marland KitchenMarland Kitchen Ok on diet Rx. ~  Labs 4/13 showed BS= 122, A1c= 7.1 on diet alone &  we reviewed low carb diet restriction...  Hx of ESOPHAGEAL STRICTURE (ICD-530.3) - last EGD 6/04 by DrDBrodie w/ esophagitis, hx gastritis... 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 treated w/ PRILOSEC 20mg Bid... ~  [facility] Sep09 w/ atyp CP felt to be esoph spasm... GI f/u DrDBrodie w/ EGD Sep09= candida esoph, nonobstructing esoph stricture dilated. ~  2/11: presents w/ incr dysphagia & f/u GI w/ Ba Esophagram-mild stricture;  EGD 3/11 showed candida esophagitis (Rx'd);  Sonar- no gallstones, WNL.Marland KitchenMarland Kitchen  IRRITABLE BOWEL SYNDROME, HX OF (ICD-V12.79) - colonoscopy was 6/04 by DrDBrodie= neg without polyps etc... f/u 9/09 was negative- no recurrent polyps... f/u planned 9/14... ~  she reports trouble w/ constip and hx recurrent rectocele- initial surg 1987, now sees DrGottsegen for GYN & may need additional surg... ~  She saw DrDBrodie 9/12> sm vol intermit painless rectal bleeding from anal fissures; rectocele, constip, difficulty evacuating; on stool softeners & Nupercainal oint.  UROLOGY> followed by DrKimbrough> Hx microhematuria, renal cyst, duplication & dilatation on the left, left sided pain off & on...  DEGENERATIVE JOINT DISEASE (ICD-715.90) >> on TRAMADOL 50mg  prn  BACK PAIN, LUMBAR (ICD-724.2) >> she also takes MVI, Vit D...  FIBROMYALGIA (ICD-729.1)  PERIPHERAL NEUROPATHY (ICD-356.9) - sl worse per pt and DrLove follows... she takes NEURONTIN 300mg Bid and refuses other meds... 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  ?TIA - hosp 11/07 by Autumn Patty w/ ?TIA vs sleep paralysis episode... MRIBrain w/ sm vessel dis and atherosclerotic changes... she still complains of "weak spells, my whole body gets weak in the afternoons"... these episodes last ~79min, resolve spont w/ rest or ?better after eating... she is convinced that the sleep paralysis episodes were  caused by the Requip med...  ANXIETY DEPRESSION (ICD-300.4) - she states that "my nerves are shot" w/ stress from son's alcoholism & divorce, plus her husb's illness etc... started ZOLOFT 50mg /d 7/10 & improved...  ANEMIA >> due to Anemia of chronic dis & blood loss from epistaxis 2/13 & 3/13... ~  Labs from 2/13 showed baseline Hg= 10-11 range & bleed down to Hg=7.7 on Tx to [facility]... ~  Labs here 4/13 showed Hg= 9.1, Fe= 18 (4%sat); started on FeSO4 325mg  Bid (w/ VitC 500mg ) but she only took once daily... ~  Labs 5/13 showed Hg= 11.1, Fe= 132 (38%)... On Fe one daily...  Health Maintenance: ~  GYN= DrGottsegen w/ Mammograms at [facility] (last 4/10 w/ ultrasound f/u)... Gyn does BMD's and she reports it was normal several yrs ago, not on calcium, MVI, etc... ~  Immunizations:  she reports 2010 Flu vaccine & Pneumonia shot in 2010... cna't recall last Tetanus.   Past Medical History  Diagnosis Date  . OSA (obstructive sleep apnea)   . HTN (hypertension)   . Atrial fibrillation     paroxysmal; on coumadin  . CAD (coronary artery disease)     a. cath 7/11: LM 40%, mild plaque disease in CFX, LAD, and RCA;  b. CABG with S-LAD and S-OM done at time of Aortic dissection repair  . Diastolic heart failure   . Dissection of aorta, thoracic     a. Type A; s/p repair 7/11 with aortic root repair and CABG x 2   . ASD (atrial septal defect)     a. s/p repair 1982 at [facility]  . Right heart failure  a. 2/2 TR and RV dysfxn;  b. echo 4/12: EF 60%, mild LVH, mild AI, mild MR, mod LAE, mod RVE with mod dec. RVSF, mod RAE, mod to severe TR, PASP 58;  c. right heart cath 4/12:  RA mean 8, RV 46/1 with mean 6, PA 45/13 with mean 26, PCWP mean 14, CO 3.68, CI 2.1 (no sig pulmon HTN)  . GERD (gastroesophageal reflux disease)   . IBS (irritable bowel syndrome)   . Esophageal stricture   . Colonic polyp   . Peripheral neuropathy   . Back pain   . Fibromyalgia   . Anxiety   . HLD (hyperlipidemia)     . Borderline diabetes   . History of TIAs   . DJD (degenerative joint disease)   . Normocytic anemia   . Thrombocytopenia   . Macular degeneration   . Aneurysm 2011    Disecting aortic aneurysm  . Depression   . Atrial septal defect   . Esophageal dysmotilities     Past Surgical History  Procedure Date  . Asd repair 1982    [facility]  . Rotator cuff repair 2007    right  . Cardioversion     x 3  . Tubal ligation   . Appendectomy   . Vaginal hysterectomy 1987    A/P Repair With Cystocele and rectocele repair  . Tonsillectomy   . Oophorectomy 1994    BSO  . Pelvic laparoscopy 1994  . Emergency redo median sternotomy for hemiarch repair of acute type a aortic  dissection 06/05/2010  . Nasal hemorrhage control 12/27/2011    Procedure: EPISTAXIS CONTROL;  Surgeon: Melvenia Beam, MD;  Location: [facility] OR;  Service: ENT;  Laterality: N/A;    Outpatient Encounter Prescriptions as of 04/05/2012  Medication Sig Dispense Refill  . acetaminophen (TYLENOL) 325 MG tablet Take 650 mg by mouth every 6 (six) hours as needed. For pain      . beta carotene w/minerals (OCUVITE) tablet Take 1 tablet by mouth 2 (two) times daily.        . cholecalciferol (VITAMIN D) 1000 UNITS tablet Take 1,000 Units by mouth daily.       . ferrous sulfate 325 (65 FE) MG tablet Take 325 mg by mouth daily.       . furosemide (LASIX) 80 MG tablet Take 80 mg by mouth 2 (two) times daily.      Marland Kitchen gabapentin (NEURONTIN) 300 MG capsule TAKE 2 CAPSULES BY MOUTH AT BEDTIME  60 capsule  4  . glucosamine-chondroitin 500-400 MG tablet Take 1 tablet by mouth 2 (two) times daily.      . metolazone (ZAROXOLYN) 2.5 MG tablet Take One Tablet By Mouth 30 Minutes Prior To Furosemide Dose Every other day      . metoprolol (LOPRESSOR) 50 MG tablet Take 50 mg by mouth 2 (two) times daily.      Marland Kitchen omeprazole (PRILOSEC) 20 MG capsule TAKE 1 CAPSULE BY MOUTH  TWICE A DAY 30 MINUTES BEFORE MEALS  60 capsule  0  . potassium chloride SA  (K-DUR,KLOR-CON) 20 MEQ tablet Take 4 tablets by mouth daily alternating with 5 tablets by mouth daily.      . rosuvastatin (CRESTOR) 5 MG tablet Take 5 mg by mouth every other day. HOLD      . sertraline (ZOLOFT) 50 MG tablet Take 50 mg by mouth every morning.      . traMADol (ULTRAM) 50 MG tablet Take 1 tablet (50 mg total) by mouth  3 (three) times daily as needed. For pain  90 tablet  3  . vitamin C (ASCORBIC ACID) 500 MG tablet Take 500 mg by mouth daily.       Marland Kitchen warfarin (COUMADIN) 5 MG tablet Take 5 mg by mouth as directed.        Allergies  Allergen Reactions  . Amiodarone     Severe side effects per Pt--  . Atorvastatin     REACTION: muscle pain  . Codeine     REACTION: itching  . Erythromycin   . Ezetimibe     REACTION: INTOL to Zetia w/ cough  . Meperidine Hcl     REACTION: dizziness  . Morphine   . Neomycin-Bacitracin Zn-Polymyx   . Ropinirole Hydrochloride     REACTION: INTOL to Requip w/ sleep paralysis  . Simvastatin     REACTION: unable to walk--muscle pain    Current Medications, Allergies, Past Medical History, Past Surgical History, Family History, and Social History were reviewed in [employer] record.    Review of Systems         See HPI - all other systems neg except as noted...  The patient complains of decreased hearing, dyspnea on exertion, and peripheral edema.  The patient denies anorexia, fever, weight loss, weight gain, vision loss, hoarseness, chest pain, syncope, prolonged cough, headaches, hemoptysis, abdominal pain, melena, hematochezia, severe indigestion/heartburn, hematuria, incontinence, muscle weakness, suspicious skin lesions, transient blindness, difficulty walking, depression, unusual weight change, abnormal bleeding, enlarged lymph nodes, and angioedema.    Objective:   Physical Exam    WD, Thin, 77 y/o WF in NAD... GENERAL:  Alert & oriented; pleasant & cooperative... HEENT:  [place]/AT, EOM-wnl, PERRLA,  [place]i-benign, EACs-clear, TMs-wnl, NOSE-clear, THROAT-clear & wnl... NECK:  Supple w/ fairROM; no JVD; normal carotid impulses w/o bruits; no thyromegaly or nodules palpated; no lymphadenopathy. CHEST:  Median sterotomy scar, decr BS at bases w/ few bibasilar rales... HEART:  Regular Rhythm;  gr 1/6 sys murmur, without rubs or gallops detected... s/p repair ASD & Ao dissection... ABDOMEN:  Soft & nontender; normal bowel sounds; no organomegaly or masses detected. EXT: without deformities, mild arthritic changes; no varicose veins/ +venous insuffic/ 3+ edema. NEURO:  CN's intact;  peripheral neuropathy without focal neuro deficits on exam... DERM:  No lesions noted; no rash etc...  RADIOLOGY DATA:  Reviewed in the EPIC EMR & discussed w/ the patient...  LABORATORY DATA:  Reviewed in the EPIC EMR & discussed w/ the patient...   Assessment & Plan:   Abn CXR> s/p AscAo dissection repair & bilat apical schwannomas (see CXR & CTA report); O2sat= 91% on RA & advised incr exercise at home...     HBP> on Metoprolol50Bid, Lasix80Bid, Zaroxyln2.5 2d/wk, K20 w/ 4-5-4-5 alt day regimen;  BP= 100/70 & followed closely by DrCooper...     CARDIAC> followed by DrCooper & DrOwens w/ long complic cardiac hx> ASD secundum repair at [facility], CAD, PAF, Ac on Chr Diastolic CHF, & 7/11 Asc ThorAo Dissection w/ severe AI & Lmain lesion==> s/p repair of dissecting aneurysm & CABGx2... She knows to take Amox before dental work;  known severe right heart failure w/ severe TR & no surg option> being managed by DrCooper for Cards w/ freq f/u visits but she is miserable...     AFib> on Coumadin in CC, off Amio (intol), followed by DrCooper for Cards but not many options here...     CHOL> on Crestor5Qod & her FLP looks good (see  below), but she wonders about her weakness & wants to stop for awhile...     DM> Borderline DM on diet alone & labs look ok w/ BS=122, A1c=7.1; continue diet (no sweets) + no meds at  present...     Hx esophagitis & stricture> followed by DrDBrodie on Prilosec20Bid; last EGD 3/11ndida (Rx'd); denies heartburn, pain, dysphagia, etc...  IBS w/ constip & Rectocele> also followed by DrGottsegen; on stool softeners but she has trouble w/ evacuation & they are aware...     Left flank discomfort> she also sees Urology (prev DrKimbrough) & has f/u appt pending...     DJD/ LBP/ FM> she is actually c/o "corns" on her feet that make walking difficult she says; she will try OTC pads & if not improved she will call Podiatry...     Neuropathy> followed by Autumn Patty on Neurontin300Bid & Tramadol Prn...     Anxiety/ Depression> on Zoloft50, Tranxene7.5 Prn (ave~1/d she says), & Ambien10Qhs...  ANEMIA>  Hg and Fe are remarkably better...   Patient's Medications  New Prescriptions   No medications on file  Previous Medications   ACETAMINOPHEN (TYLENOL) 325 MG TABLET    Take 650 mg by mouth every 6 (six) hours as needed. For pain   BETA CAROTENE W/MINERALS (OCUVITE) TABLET    Take 1 tablet by mouth 2 (two) times daily.     CHOLECALCIFEROL (VITAMIN D) 1000 UNITS TABLET    Take 1,000 Units by mouth daily.    FERROUS SULFATE 325 (65 FE) MG TABLET    Take 325 mg by mouth daily.    FUROSEMIDE (LASIX) 80 MG TABLET    Take 80 mg by mouth 2 (two) times daily.   GABAPENTIN (NEURONTIN) 300 MG CAPSULE    TAKE 2 CAPSULES BY MOUTH AT BEDTIME   GLUCOSAMINE-CHONDROITIN 500-400 MG TABLET    Take 1 tablet by mouth 2 (two) times daily.   METOLAZONE (ZAROXOLYN) 2.5 MG TABLET    Take One Tablet By Mouth 30 Minutes Prior To Furosemide Dose Every other day   METOPROLOL (LOPRESSOR) 50 MG TABLET    Take 50 mg by mouth 2 (two) times daily.   OMEPRAZOLE (PRILOSEC) 20 MG CAPSULE    TAKE 1 CAPSULE BY MOUTH  TWICE A DAY 30 MINUTES BEFORE MEALS   POTASSIUM CHLORIDE SA (K-DUR,KLOR-CON) 20 MEQ TABLET    Take 4 tablets by mouth daily alternating with 5 tablets by mouth daily.   ROSUVASTATIN (CRESTOR) 5 MG TABLET     Take 5 mg by mouth every other day. HOLD   SERTRALINE (ZOLOFT) 50 MG TABLET    Take 50 mg by mouth every morning.   TRAMADOL (ULTRAM) 50 MG TABLET    Take 1 tablet (50 mg total) by mouth 3 (three) times daily as needed. For pain   VITAMIN C (ASCORBIC ACID) 500 MG TABLET    Take 500 mg by mouth daily.    WARFARIN (COUMADIN) 5 MG TABLET    Take 5 mg by mouth as directed.  Modified Medications   No medications on file  Discontinued Medications   No medications on file

## 2012-04-09 ENCOUNTER — Encounter: Payer: Self-pay | Admitting: Pulmonary Disease

## 2012-04-14 ENCOUNTER — Telehealth: Payer: Self-pay | Admitting: Pulmonary Disease

## 2012-04-14 ENCOUNTER — Ambulatory Visit: Payer: Medicare Other | Admitting: *Deleted

## 2012-04-14 DIAGNOSIS — I2510 Atherosclerotic heart disease of native coronary artery without angina pectoris: Secondary | ICD-10-CM

## 2012-04-14 DIAGNOSIS — I10 Essential (primary) hypertension: Secondary | ICD-10-CM

## 2012-04-14 DIAGNOSIS — E7800 Pure hypercholesterolemia, unspecified: Secondary | ICD-10-CM

## 2012-04-14 LAB — CK TOTAL AND CKMB (NOT AT ARMC)

## 2012-04-14 NOTE — Telephone Encounter (Signed)
Please advise SN thanks 

## 2012-04-14 NOTE — Telephone Encounter (Signed)
Spoke with Gie and notified of recs per SN and he verbalized understanding and states nothing further needed.

## 2012-04-14 NOTE — Telephone Encounter (Deleted)
Spoke with Gie with [facility]. He states that after several attempts to see the pt for PT she will be discharged until unless there is another order sent to them in the future. He states that the pt did not seem interested and kept making excuses like she was waiting on several different doctors to tell her if it was ok for her to participate. Will forward to SN as FYI.

## 2012-04-14 NOTE — Telephone Encounter (Signed)
Per SN--- have them tell her she needs the therapy desperately but if she continues to refuse, they will have to release her.  thanks

## 2012-04-17 ENCOUNTER — Ambulatory Visit (INDEPENDENT_AMBULATORY_CARE_PROVIDER_SITE_OTHER): Payer: Medicare Other | Admitting: Thoracic Surgery (Cardiothoracic Vascular Surgery)

## 2012-04-17 ENCOUNTER — Encounter: Payer: Self-pay | Admitting: Thoracic Surgery (Cardiothoracic Vascular Surgery)

## 2012-04-17 VITALS — BP 120/70 | HR 70 | Resp 18 | Ht 67.0 in | Wt 156.0 lb

## 2012-04-17 DIAGNOSIS — I7101 Dissection of thoracic aorta: Secondary | ICD-10-CM

## 2012-04-17 NOTE — Progress Notes (Signed)
[address]            [name] [address]          [phone]     CARDIOTHORACIC SURGERY OFFICE NOTE  Referring Provider is Tonny Bollman, MD PCP is Michele Mcalpine, MD, MD   HPI:  Patient returns for routine followup and surveillance nearly 2 years following emergency repair of acute type A aortic dissection. She was last seen here in the office in November of 2012. Since then she has had a tough time. She developed severe recurrent epistaxis that required complex surgical intervention at [facility]. She has recovered from this reasonably well but she is still not back to her baseline functional status. She has not had any further recurrent episodes of epistaxis. Her primary complaint at this point remains that she is just quite weak, especially her legs. She has not had any pain in her chest or back suspicious for complications related to her previous aortic dissection. She has not had difficulty with blood pressure management. She is chronically treated for congestive heart failure which is stable.   Current Outpatient Prescriptions  Medication Sig Dispense Refill  . acetaminophen (TYLENOL) 325 MG tablet Take 650 mg by mouth every 6 (six) hours as needed. For pain      . beta carotene w/minerals (OCUVITE) tablet Take 1 tablet by mouth 2 (two) times daily.        . cholecalciferol (VITAMIN D) 1000 UNITS tablet Take 1,000 Units by mouth daily.       . ferrous sulfate 325 (65 FE) MG tablet Take 325 mg by mouth daily.       . furosemide (LASIX) 80 MG tablet Take 80 mg by mouth 2 (two) times daily.      Marland Kitchen gabapentin (NEURONTIN) 300 MG capsule TAKE 2 CAPSULES BY MOUTH AT BEDTIME  60 capsule  4  . glucosamine-chondroitin 500-400 MG tablet Take 1 tablet by mouth 2 (two) times daily.      . metolazone (ZAROXOLYN) 2.5 MG tablet Take 2.5 mg by mouth every other day.       . metoprolol (LOPRESSOR) 50 MG tablet Take 50 mg by mouth 2 (two) times daily.        Marland Kitchen omeprazole (PRILOSEC) 20 MG capsule TAKE 1 CAPSULE BY MOUTH  TWICE A DAY 30 MINUTES BEFORE MEALS  60 capsule  0  . potassium chloride SA (K-DUR,KLOR-CON) 20 MEQ tablet Take 4 tablets by mouth daily alternating with 5 tablets by mouth daily.      . sertraline (ZOLOFT) 50 MG tablet Take 50 mg by mouth every morning.      . traMADol (ULTRAM) 50 MG tablet Take 1 tablet (50 mg total) by mouth 3 (three) times daily as needed. For pain  90 tablet  3  . vitamin C (ASCORBIC ACID) 500 MG tablet Take 500 mg by mouth daily.       Marland Kitchen warfarin (COUMADIN) 5 MG tablet Take 5 mg by mouth as directed.          Physical Exam:   BP 120/70  Pulse 70  Resp 18  Ht 5\' 7"  (1.702 m)  Wt 156 lb (70.761 kg)  BMI 24.43 kg/m2  SpO2 97%  General:  Frail appearing  Chest:   Clear to auscultation with symmetrical breath sounds  CV:   Regular rate and rhythm  Incisions:  n/a  Abdomen:  Soft and nontender  Extremities:  Warm and well-perfused with moderate bilateral lower extremity edema  Diagnostic Tests:  *RADIOLOGY REPORT* 03/22/2012   Clinical Data:  Status post prior thoracic aortic repair and proximal grafting for type A aortic dissection and CABG in July, 2011.   CT ANGIOGRAPHY CHEST, ABDOMEN AND PELVIS   Technique:  Multidetector CT imaging through the chest, abdomen and pelvis was performed using the standard protocol during bolus administration of intravenous contrast.  Multiplanar reconstructed images including MIPs were obtained and reviewed to evaluate the vascular anatomy.   Contrast: OMNIPAQUE IOHEXOL 350 MG/ML SOLN   Comparison:  Multiple prior studies.  The most recent CTA is dated 09/27/2011.   CTA CHEST   Findings:  Stable CT appearance and patency of the proximal aortic graft with no evidence of pseudoaneurysm or leak.  Aortic arch and descending thoracic aorta remain of normal caliber.  There is no evidence of aortic dissection.   Stable appearance of intimal flaps in  the innominate artery, the base of the right common carotid artery and right subclavian and axillary arteries.  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  Patent coronary bypass grafts are again visualized.   Stable fluid attenuation at the medial lung apices bilaterally along the distribution of the proximal upper thoracic nerve roots.   Stable significant dilatation of central pulmonary arteries consistent with pulmonary hypertension.  Stable global cardiomegaly and evidence of right heart failure with reflux of contrast into a large IVC.  Lung windows show no overt pulmonary airspace edema or associated pleural or pericardial fluid.   A stable nodule in the superior segment of the left lower lobe which appears partially calcified.  There also is a stable area of nodularity in the right lower lobe.  No enlarged lymph nodes are identified.    Review of the MIP images confirms the above findings.   IMPRESSION:   1.  Stable appearance of the proximal aortic graft without evidence of recurrent dissection or pseudoaneurysm.  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   2.  Stable central pulmonary artery enlargement consistent with pulmonary hypertension. 3.  Stable global cardiomegaly and evidence of right heart failure without overt pulmonary edema.   CTA ABDOMEN AND PELVIS   Findings:  The abdominal aorta shows stable and normal patency as well as stable tortuosity.  There is no evidence of aortic aneurysm or dissection.  Visceral branches show no significant disease with nonobstructive plaque again noted at the origin of the celiac axis, superior mesenteric artery and both renal arteries.  There is no evidence of significant renal artery stenosis.  The inferior mesenteric artery is  open.  Iliac and femoral arteries show normal patency.   Reflux of contrast is noted into the enlarged IVC and distended hepatic veins consistent with right heart failure.  There is no evidence of ascites.  No hernias, masses or enlarged lymph nodes are identified.   Bowel loops are unremarkable.  The bladder shows normal appearance. There is stable scoliosis of the lumbar spine and associated spondylosis.    Review of the MIP images confirms the above findings.   IMPRESSION: Stable patency of abdominal aorta and pelvic arteries without aneurysmal disease or significant obstructive disease.  Reflux of contrast into distended IVC and hepatic veins consistent with right heart  failure.   Original Report Authenticated By: Reola Calkins, M.D.    Impression:  The patient remains clinically stable with respect to her previous aortic dissection. The radiographic appearance of the transverse aortic arch has not changed. There is no dissection involving the descending thoracic or abdominal aorta.  Plan:  We will have the patient return in one years time for followup CT angiogram of the chest.   Salvatore Decent. Cornelius Moras, MD 04/17/2012 1:38 PM

## 2012-04-19 ENCOUNTER — Encounter: Payer: Self-pay | Admitting: Gynecology

## 2012-04-19 ENCOUNTER — Other Ambulatory Visit: Payer: Self-pay | Admitting: Cardiovascular Disease

## 2012-04-19 ENCOUNTER — Ambulatory Visit (INDEPENDENT_AMBULATORY_CARE_PROVIDER_SITE_OTHER): Payer: Medicare Other | Admitting: Gynecology

## 2012-04-19 ENCOUNTER — Other Ambulatory Visit: Payer: Self-pay

## 2012-04-19 ENCOUNTER — Telehealth: Payer: Self-pay | Admitting: Pulmonary Disease

## 2012-04-19 ENCOUNTER — Other Ambulatory Visit: Payer: Self-pay | Admitting: Neurology

## 2012-04-19 DIAGNOSIS — R531 Weakness: Secondary | ICD-10-CM

## 2012-04-19 DIAGNOSIS — N762 Acute vulvitis: Secondary | ICD-10-CM

## 2012-04-19 DIAGNOSIS — N760 Acute vaginitis: Secondary | ICD-10-CM

## 2012-04-19 MED ORDER — SERTRALINE HCL 50 MG PO TABS: 50.0000 mg | ORAL_TABLET | ORAL | Status: DC

## 2012-04-19 MED ORDER — WARFARIN SODIUM 5 MG PO TABS: 5.0000 mg | ORAL_TABLET | ORAL | Status: DC

## 2012-04-19 MED ORDER — AMOXICILLIN 500 MG PO TABS: ORAL_TABLET | ORAL | Status: DC

## 2012-04-19 NOTE — Telephone Encounter (Signed)
Leigh, please ask SN if he is okay with refilling coumadin; I will send refills for Zoloft as well.   04-05-12: ATRIAL FIBRILLATION (ICD-427.31) - on COUMADIN followed in the CC and very carefully monitored due to her severe epistaxis; managed by DrCooper for Cards & she is intol to Amiodarone...   Thanks.

## 2012-04-19 NOTE — Telephone Encounter (Signed)
Please send to [facility] on [address] . [phone].

## 2012-04-19 NOTE — Progress Notes (Signed)
Patient presents in follow up having recently been treated for an infected left lower vulvar sebaceous cyst with surrounding cellulitis. She's finished the antibiotics and sitz baths and notes that is pretty much gone.  Exam of Sherrilyn Rist chaperone present External with atrophic genital changes and diffuse benign mild varicosities. The left lower labial sebaceous cyst area with cellulitis has resolved and she has a small dimpled area which is noninflammatory and nontender.  Assessment and plan: Resolved infected sebaceous cyst with cellulitis. Now with just a small dimpled area. No residual nodularity. Patient we'll plan observation if there is any recurrent inflammation or infection she knows to represent ASAP. Otherwise she will follow up routinely when she is due for a routine exam.

## 2012-04-19 NOTE — Telephone Encounter (Signed)
Per SN---ok to send in refill of the coumadin to read take as directed.   thanks

## 2012-04-19 NOTE — Telephone Encounter (Signed)
Meds were refilled. LMOM informing pt that this was done.

## 2012-04-19 NOTE — Patient Instructions (Signed)
Follow up if there is any recurrence in the infection. Otherwise routinely when you're due for your routine checkup.

## 2012-04-20 ENCOUNTER — Telehealth: Payer: Self-pay | Admitting: Cardiovascular Disease

## 2012-04-20 NOTE — Telephone Encounter (Signed)
FORWARD TO DR/NURSE

## 2012-04-20 NOTE — Telephone Encounter (Signed)
I spoke with the patient on the telephone. Her weight is up about 4 pounds. She complains of worsening shortness of breath and orthopnea. She repeated her vital signs in her blood pressure is 95/63 with a pulse rate of 56. I reviewed her medications. I asked her to take an additional 5 mg of metolazone right now and follow that up and 30 minutes with 80 mg of furosemide. If her breathing worsens, I advised her to go to the emergency department for hospital admission. She did not sound to be in any respiratory distress over the telephone. She will come in tomorrow for a Coumadin clinic visit and I would like her to have an EKG while she is here. We will also await her on our scale while she is here to compare to her recent office visit weight. Further followup pending her response to increased diuretics and evaluation tomorrow.  Tonny Bollman 04/20/2012 5:33 PM

## 2012-04-20 NOTE — Telephone Encounter (Signed)
New msg Pt said weight has went from 155.2 and today 159.4. she said she has has some sob. Bp 98/71 hr 136 She said she has a coumadin appt tomorrow. Please call

## 2012-04-21 ENCOUNTER — Inpatient Hospital Stay (HOSPITAL_COMMUNITY)
Admission: EM | Admit: 2012-04-21 | Discharge: 2012-04-27 | DRG: 293 | Disposition: A | Discharge status: Home / Self Care | Payer: Medicare Other | Attending: Cardiovascular Disease | Admitting: Cardiovascular Disease

## 2012-04-21 ENCOUNTER — Emergency Department (HOSPITAL_COMMUNITY): Payer: Medicare Other

## 2012-04-21 ENCOUNTER — Encounter (HOSPITAL_COMMUNITY): Payer: Self-pay | Admitting: *Deleted

## 2012-04-21 DIAGNOSIS — G4733 Obstructive sleep apnea (adult) (pediatric): Secondary | ICD-10-CM | POA: Diagnosis present

## 2012-04-21 DIAGNOSIS — I509 Heart failure, unspecified: Secondary | ICD-10-CM | POA: Diagnosis present

## 2012-04-21 DIAGNOSIS — I369 Nonrheumatic tricuspid valve disorder, unspecified: Secondary | ICD-10-CM

## 2012-04-21 DIAGNOSIS — Z8673 Personal history of transient ischemic attack (TIA), and cerebral infarction without residual deficits: Secondary | ICD-10-CM

## 2012-04-21 DIAGNOSIS — IMO0001 Reserved for inherently not codable concepts without codable children: Secondary | ICD-10-CM | POA: Diagnosis present

## 2012-04-21 DIAGNOSIS — I2510 Atherosclerotic heart disease of native coronary artery without angina pectoris: Secondary | ICD-10-CM | POA: Diagnosis present

## 2012-04-21 DIAGNOSIS — I498 Other specified cardiac arrhythmias: Secondary | ICD-10-CM | POA: Diagnosis present

## 2012-04-21 DIAGNOSIS — I50810 Right heart failure, unspecified: Secondary | ICD-10-CM

## 2012-04-21 DIAGNOSIS — E876 Hypokalemia: Secondary | ICD-10-CM | POA: Diagnosis present

## 2012-04-21 DIAGNOSIS — I10 Essential (primary) hypertension: Secondary | ICD-10-CM | POA: Diagnosis present

## 2012-04-21 DIAGNOSIS — I4891 Unspecified atrial fibrillation: Secondary | ICD-10-CM | POA: Diagnosis present

## 2012-04-21 DIAGNOSIS — K219 Gastro-esophageal reflux disease without esophagitis: Secondary | ICD-10-CM | POA: Diagnosis present

## 2012-04-21 DIAGNOSIS — Z951 Presence of aortocoronary bypass graft: Secondary | ICD-10-CM

## 2012-04-21 DIAGNOSIS — I5033 Acute on chronic diastolic (congestive) heart failure: Principal | ICD-10-CM | POA: Diagnosis present

## 2012-04-21 DIAGNOSIS — Z7901 Long term (current) use of anticoagulants: Secondary | ICD-10-CM

## 2012-04-21 DIAGNOSIS — I480 Paroxysmal atrial fibrillation: Secondary | ICD-10-CM | POA: Diagnosis present

## 2012-04-21 DIAGNOSIS — R001 Bradycardia, unspecified: Secondary | ICD-10-CM

## 2012-04-21 HISTORY — DX: Chronic diastolic (congestive) heart failure: I50.32

## 2012-04-21 LAB — BASIC METABOLIC PANEL
BUN: 28 mg/dL — ABNORMAL HIGH (ref 6–23)
Calcium: 9 mg/dL (ref 8.4–10.5)
Chloride: 99 mEq/L (ref 96–112)
GFR calc Af Amer: 76 mL/min — ABNORMAL LOW (ref >90)
GFR calc Af Amer: 82 mL/min — ABNORMAL LOW (ref >90)
GFR calc non Af Amer: 71 mL/min — ABNORMAL LOW (ref >90)
Glucose, Bld: 127 mg/dL — ABNORMAL HIGH (ref 70–99)
Glucose, Bld: 182 mg/dL — ABNORMAL HIGH (ref 70–99)
Potassium: 2.8 mEq/L — ABNORMAL LOW (ref 3.5–5.1)
Potassium: 3.3 mEq/L — ABNORMAL LOW (ref 3.5–5.1)
Sodium: 144 mEq/L (ref 135–145)
Sodium: 143 mEq/L (ref 135–145)

## 2012-04-21 LAB — CBC
HCT: 40.8 % (ref 36.0–46.0)
Hemoglobin: 11.9 g/dL — ABNORMAL LOW (ref 12.0–15.0)
WBC: 3.9 10*3/uL — ABNORMAL LOW (ref 4.0–10.5)

## 2012-04-21 LAB — DIFFERENTIAL
Basophils Absolute: 0 10*3/uL (ref 0.0–0.1)
Basophils Relative: 0 % (ref 0–1)
Lymphocytes Relative: 15 % (ref 12–46)
Monocytes Relative: 10 % (ref 3–12)
Neutro Abs: 2.8 10*3/uL (ref 1.7–7.7)

## 2012-04-21 LAB — URINE MICROSCOPIC-ADD ON

## 2012-04-21 LAB — MAGNESIUM: Magnesium: 2.1 mg/dL (ref 1.5–2.5)

## 2012-04-21 LAB — URINALYSIS, ROUTINE W REFLEX MICROSCOPIC
Nitrite: NEGATIVE
Specific Gravity, Urine: 1.012 (ref 1.005–1.030)
pH: 6.5 (ref 5.0–8.0)

## 2012-04-21 LAB — CARDIAC PANEL(CRET KIN+CKTOT+MB+TROPI)
CK, MB: 2 ng/mL (ref 0.3–4.0)
CK, MB: 3.6 ng/mL (ref 0.3–4.0)
Total CK: 196 U/L — ABNORMAL HIGH (ref 7–177)
Troponin I: <0.3 ng/mL (ref <0.30)
Troponin I: <0.3 ng/mL (ref <0.30)

## 2012-04-21 LAB — PRO B NATRIURETIC PEPTIDE: Pro B Natriuretic peptide (BNP): 4399 pg/mL — ABNORMAL HIGH (ref 0–125)

## 2012-04-21 LAB — POCT I-STAT TROPONIN I

## 2012-04-21 MED ORDER — SODIUM CHLORIDE 0.9 % IJ SOLN
3.0000 mL | Freq: Two times a day (BID) | INTRAMUSCULAR | Status: DC
  Administered 2012-04-21 – 2012-04-27 (×12): 3 mL via INTRAVENOUS

## 2012-04-21 MED ORDER — OCUVITE PO TABS
1.0000 | ORAL_TABLET | Freq: Two times a day (BID) | ORAL | Status: DC
  Administered 2012-04-21 – 2012-04-27 (×11): 1 via ORAL
  Filled 2012-04-21 (×14): qty 1

## 2012-04-21 MED ORDER — SODIUM CHLORIDE 0.9 % IJ SOLN
3.0000 mL | INTRAMUSCULAR | Status: DC | PRN
  Administered 2012-04-22: 3 mL via INTRAVENOUS

## 2012-04-21 MED ORDER — SERTRALINE HCL 50 MG PO TABS
50.0000 mg | ORAL_TABLET | Freq: Every morning | ORAL | Status: DC
  Administered 2012-04-22 – 2012-04-27 (×6): 50 mg via ORAL
  Filled 2012-04-21 (×6): qty 1

## 2012-04-21 MED ORDER — FERROUS SULFATE 325 (65 FE) MG PO TABS
325.0000 mg | ORAL_TABLET | Freq: Every day | ORAL | Status: DC
  Administered 2012-04-22 – 2012-04-27 (×6): 325 mg via ORAL
  Filled 2012-04-21 (×7): qty 1

## 2012-04-21 MED ORDER — PANTOPRAZOLE SODIUM 40 MG PO TBEC
40.0000 mg | DELAYED_RELEASE_TABLET | Freq: Every day | ORAL | Status: DC
  Administered 2012-04-22 – 2012-04-27 (×6): 40 mg via ORAL
  Filled 2012-04-21 (×5): qty 1

## 2012-04-21 MED ORDER — TRAMADOL HCL 50 MG PO TABS
50.0000 mg | ORAL_TABLET | Freq: Three times a day (TID) | ORAL | Status: DC | PRN
  Administered 2012-04-22 – 2012-04-26 (×4): 50 mg via ORAL
  Filled 2012-04-21 (×4): qty 1

## 2012-04-21 MED ORDER — DOCUSATE SODIUM 100 MG PO CAPS
100.0000 mg | ORAL_CAPSULE | Freq: Two times a day (BID) | ORAL | Status: DC
  Administered 2012-04-21 – 2012-04-27 (×12): 100 mg via ORAL
  Filled 2012-04-21 (×13): qty 1

## 2012-04-21 MED ORDER — VITAMIN C 500 MG PO TABS
500.0000 mg | ORAL_TABLET | Freq: Every day | ORAL | Status: DC
  Administered 2012-04-22 – 2012-04-27 (×6): 500 mg via ORAL
  Filled 2012-04-21 (×7): qty 1

## 2012-04-21 MED ORDER — VITAMIN D3 25 MCG (1000 UNIT) PO TABS
1000.0000 [IU] | ORAL_TABLET | Freq: Every day | ORAL | Status: DC
  Administered 2012-04-22 – 2012-04-27 (×6): 1000 [IU] via ORAL
  Filled 2012-04-21 (×7): qty 1

## 2012-04-21 MED ORDER — SODIUM CHLORIDE 0.9 % IV SOLN: 250.0000 mL | INTRAVENOUS | Status: DC | PRN

## 2012-04-21 MED ORDER — WARFARIN SODIUM 2.5 MG PO TABS
2.5000 mg | ORAL_TABLET | ORAL | Status: DC
  Administered 2012-04-24: 2.5 mg via ORAL
  Filled 2012-04-21: qty 1

## 2012-04-21 MED ORDER — POTASSIUM CHLORIDE CRYS ER 20 MEQ PO TBCR
40.0000 meq | EXTENDED_RELEASE_TABLET | Freq: Three times a day (TID) | ORAL | Status: DC
  Administered 2012-04-21 – 2012-04-26 (×17): 40 meq via ORAL
  Filled 2012-04-21 (×21): qty 2

## 2012-04-21 MED ORDER — POLYETHYLENE GLYCOL 3350 17 G PO PACK
17.0000 g | PACK | Freq: Every day | ORAL | Status: DC
  Administered 2012-04-22 – 2012-04-23 (×2): 17 g via ORAL
  Filled 2012-04-21 (×3): qty 1

## 2012-04-21 MED ORDER — ACETAMINOPHEN 325 MG PO TABS
650.0000 mg | ORAL_TABLET | ORAL | Status: DC | PRN
  Administered 2012-04-21 – 2012-04-26 (×7): 650 mg via ORAL
  Filled 2012-04-21 (×7): qty 2

## 2012-04-21 MED ORDER — ACETAMINOPHEN 325 MG PO TABS: 650.0000 mg | ORAL_TABLET | ORAL | Status: DC | PRN

## 2012-04-21 MED ORDER — ONDANSETRON HCL 4 MG/2ML IJ SOLN: 4.0000 mg | Freq: Four times a day (QID) | INTRAMUSCULAR | Status: DC | PRN

## 2012-04-21 MED ORDER — GABAPENTIN 300 MG PO CAPS
600.0000 mg | ORAL_CAPSULE | Freq: Every day | ORAL | Status: DC
  Administered 2012-04-21 – 2012-04-26 (×6): 600 mg via ORAL
  Filled 2012-04-21 (×7): qty 2

## 2012-04-21 MED ORDER — WARFARIN SODIUM 5 MG PO TABS
5.0000 mg | ORAL_TABLET | ORAL | Status: DC
  Administered 2012-04-21 – 2012-04-26 (×5): 5 mg via ORAL
  Filled 2012-04-21 (×7): qty 1

## 2012-04-21 MED ORDER — METOPROLOL TARTRATE 12.5 MG HALF TABLET
12.5000 mg | ORAL_TABLET | Freq: Two times a day (BID) | ORAL | Status: DC
  Administered 2012-04-21 – 2012-04-27 (×13): 12.5 mg via ORAL
  Filled 2012-04-21 (×13): qty 1

## 2012-04-21 MED ORDER — WARFARIN - PHARMACIST DOSING INPATIENT
Freq: Every day | Status: DC
  Administered 2012-04-23 – 2012-04-25 (×3)

## 2012-04-21 MED ORDER — FUROSEMIDE 10 MG/ML IJ SOLN
80.0000 mg | Freq: Two times a day (BID) | INTRAMUSCULAR | Status: DC
  Administered 2012-04-22 – 2012-04-25 (×9): 80 mg via INTRAVENOUS
  Filled 2012-04-21 (×12): qty 8

## 2012-04-21 MED ORDER — POTASSIUM CHLORIDE CRYS ER 20 MEQ PO TBCR
40.0000 meq | EXTENDED_RELEASE_TABLET | Freq: Once | ORAL | Status: AC
  Administered 2012-04-21: 40 meq via ORAL
  Filled 2012-04-21: qty 2

## 2012-04-21 NOTE — ED Notes (Signed)
Reports having sob for several days and swelling to legs, hx of chf.

## 2012-04-21 NOTE — ED Notes (Signed)
Family at bedside. 

## 2012-04-21 NOTE — ED Notes (Signed)
MD at bedside. 

## 2012-04-21 NOTE — Telephone Encounter (Signed)
Follow up on previous call.   Per Misty Stanley,  Mother had a bad night. Weight 159 on yesterday morning   .  Weight today 156.8. B/p sitting 109/64, pulse 59. Standing 113/67, pulse 56. Daughter ask on the scale from 1-10 how does she feels.  Patient states she a 9.  2+ edema . Son at home with her now. husband at cardiac rehab. Daughter rather wait to her from [facility] due to waiting long hours in the emergency room. She rather had an direct admit.

## 2012-04-21 NOTE — H&P (Signed)
Patient ID: [name] MRN: [id number], DOB/AGE: 1935-09-15   Admit date: 04/21/2012   Primary Physician: Michele Mcalpine, MD, MD Primary Cardiologist: Judie Petit. Excell Seltzer, MD  Pt. Profile:  77yr old female with significant h/o CAD s/p CABG,CHF and aortic aneurysm repair who presents with SOB and generalized weakness.  Problem List  Past Medical History  Diagnosis Date  . OSA (obstructive sleep apnea)   . HTN (hypertension)   . Atrial fibrillation     paroxysmal; on coumadin  . CAD (coronary artery disease)     a. cath 7/11: LM 40%, mild plaque disease in CFX, LAD, and RCA;  b. 2011 CABG with S-LAD and S-OM done at time of Aortic dissection repair  . Chronic diastolic CHF (congestive heart failure)     a. 08/2011 Echo: EF 55-60%, PASP  . Dissection of aorta, thoracic     a. Type A; s/p repair 7/11 with aortic root repair and CABG x 2   . ASD (atrial septal defect)     a. s/p repair [facility] at [facility]  . Right heart failure     a. 2/2 TR and RV dysfxn;  b. echo 4/12: EF 60%, mild LVH, mild AI, mild MR, mod LAE, mod RVE with mod dec. RVSF, mod RAE, mod to severe TR, PASP 58;  c. right heart cath 4/12:  RA mean 8, RV 46/1 with mean 6, PA 45/13 with mean 26, PCWP mean 14, CO 3.68, CI 2.1 (no sig pulmon HTN)  . GERD (gastroesophageal reflux disease)   . IBS (irritable bowel syndrome)   . Esophageal stricture   . Colonic polyp   . Peripheral neuropathy   . Back pain   . Fibromyalgia   . Anxiety   . HLD (hyperlipidemia)   . Borderline diabetes   . History of TIAs   . DJD (degenerative joint disease)   . Normocytic anemia   . Thrombocytopenia   . Macular degeneration   . Depression   . Esophageal dysmotilities     Past Surgical History  Procedure Date  . Asd repair 1982    [facility]  . Rotator cuff repair 2007    right  . Cardioversion     x 3  . Tubal ligation   . Appendectomy   . Vaginal hysterectomy 1987    A/P Repair With Cystocele and rectocele repair  .  Tonsillectomy   . Oophorectomy 1994    BSO  . Pelvic laparoscopy 1994  . Emergency redo median sternotomy for hemiarch repair of acute type a aortic  dissection 06/05/2010  . Nasal hemorrhage control 12/27/2011    Procedure: EPISTAXIS CONTROL;  Surgeon: Melvenia Beam, MD;  Location: [facility] OR;  Service: ENT;  Laterality: N/A;    Allergies  Allergies  Allergen Reactions  . Amiodarone     Severe side effects per Pt--  . Atorvastatin     REACTION: muscle pain  . Codeine     REACTION: itching  . Erythromycin Other (See Comments)    unknown  . Ezetimibe     REACTION: INTOL to Zetia w/ cough  . Meperidine Hcl Other (See Comments)    REACTION: dizziness  . Morphine Other (See Comments)    Pt states it "makes her crazy"  . Neomycin-Bacitracin Zn-Polymyx Other (See Comments)    blisters  . Ropinirole Hydrochloride     REACTION: INTOL to Requip w/ sleep paralysis  . Simvastatin     REACTION: unable to walk--muscle pain   HPI  77yr old with above multiple medical problems including CAD s/p CABG 2011 and CHF.  Since Feb of this year, following surgery for extensive nose bleed @ [facility], she has noted significant bilat leg wkns.  She notes that she used to walk a few miles/day but now is very weak and can barely walk across the room.  Earlier this week, she also noted DOE.  Her weight rose from a baseline of roughly 152 to 159 yesterday.  She called the office and was advised to take additional metalozone and lasix.  Though her weight is down roughly 5 lbs since last night, she cont to feel weak and dyspneic.  She called the office today and was advised to present to ED.  CXR shows vascular congestion.  K is 2.8.  Currently c/o wkns.  Mildly bradycardic and hypotensive.   Home Medications  Prior to Admission medications   Medication Sig Start Date End Date Taking? Authorizing Provider  acetaminophen (TYLENOL) 325 MG tablet Take 650 mg by mouth as needed. For pain   Yes Historical Provider, MD    amoxicillin (AMOXIL) 500 MG tablet Take 4 tablets by mouth one hour prior to dental procedure 04/19/12  Yes Tonny Bollman, MD  beta carotene w/minerals (OCUVITE) tablet Take 1 tablet by mouth 2 (two) times daily.     Yes Historical Provider, MD  cholecalciferol (VITAMIN D) 1000 UNITS tablet Take 1,000 Units by mouth daily.    Yes Historical Provider, MD  docusate sodium (COLACE) 100 MG capsule Take 100 mg by mouth 2 (two) times daily.   Yes Historical Provider, MD  ferrous sulfate 325 (65 FE) MG tablet Take 325 mg by mouth daily.    Yes Historical Provider, MD  furosemide (LASIX) 80 MG tablet Take 80 mg by mouth 2 (two) times daily. 12/07/11  Yes Tonny Bollman, MD  gabapentin (NEURONTIN) 300 MG capsule TAKE 2 CAPSULES BY MOUTH AT BEDTIME 01/21/12  Yes Michele Mcalpine, MD  glucosamine-chondroitin 500-400 MG tablet Take 1 tablet by mouth 2 (two) times daily.   Yes Historical Provider, MD  metolazone (ZAROXOLYN) 2.5 MG tablet Take 2.5 mg by mouth every other day.  02/14/12  Yes Tonny Bollman, MD  metoprolol (LOPRESSOR) 50 MG tablet Take 50 mg by mouth 2 (two) times daily. 12/07/11 12/06/12 Yes Tonny Bollman, MD  omeprazole (PRILOSEC) 20 MG capsule TAKE 1 CAPSULE BY MOUTH  TWICE A DAY 30 MINUTES BEFORE MEALS 03/23/12  Yes Michele Mcalpine, MD  polyethylene glycol [facility] / GLYCOLAX) packet Take 17 g by mouth daily.   Yes Historical Provider, MD  potassium chloride SA (K-DUR,KLOR-CON) 20 MEQ tablet Take 80-100 mEq by mouth daily. Take 4 tablets by mouth daily alternating with 5 tablets by mouth daily. 02/14/12  Yes Tonny Bollman, MD  sertraline (ZOLOFT) 50 MG tablet Take 1 tablet (50 mg total) by mouth every morning. 04/19/12  Yes Michele Mcalpine, MD  traMADol (ULTRAM) 50 MG tablet Take 1 tablet (50 mg total) by mouth 3 (three) times daily as needed. For pain 02/15/12  Yes Michele Mcalpine, MD  vitamin C (ASCORBIC ACID) 500 MG tablet Take 500 mg by mouth daily.    Yes Historical Provider, MD  warfarin (COUMADIN) 5 MG tablet  Take 2.5-5 mg by mouth as directed. 2.5 mg (1/2 tab) on Monday takes 5 mg all other days 04/19/12  Yes Michele Mcalpine, MD   Family History  Family History  Problem Relation Age of Onset  . Pancreatic cancer Sister   .  Osteoporosis Sister   . Lung cancer Brother     x 2  . Heart attack Brother   . Melanoma Sister   . Diabetes Mother   . Hypertension Mother   . Colon cancer Sister   . ALS Mother   . Heart disease Brother     x 2   Social History  History   Social History  . Marital Status: Married    Spouse Name: linwood    Number of Children: N/A  . Years of Education: N/A   Occupational History  . retired Charity fundraiser    Social History Main Topics  . Smoking status: Never Smoker   . Smokeless tobacco: Never Used  . Alcohol Use: No  . Drug Use: No  . Sexually Active: No   Other Topics Concern  . Not on file   Social History Narrative   Lives with spouse.    Review of Systems General:  No chills, fever, night sweats or weight changes.  Cardiovascular:  No chest pain, + dyspnea on exertion, edema, orthopnea. Dermatological: no rash, lesions/masses,  Urologic: No hematuria, dysuria Abdominal:   No nausea, vomiting, diarrhea, bright red blood per rectum, melena, or hematemesis Neurologic: + Wkns, no visual changes, , changes in mental status. All other systems reviewed and are otherwise negative except as noted above.  Physical Exam  Blood pressure 91/66, pulse 40, temperature 97.6 F (36.4 C), temperature source Oral, resp. rate 15, SpO2 95.00%.  General: Pleasant, frail appearing female Psych: Normal affect. Neuro: Alert and oriented X 3. Moves all extremities spontaneously. HEENT: Normal  Neck: Supple w/o bruits.  JVP to Ears. Lungs:  Resp regular, labored with speech, diminished breath sounds bilat (L>R) with basilar crackles. Heart: irregular, brady.  3/6 SM loudest @ [facility]. Abdomen: Soft, non-tender, non-distended, BS + x 4.  Extremities: + 1 edema, +  erythema BLE. No clubbing, cyanosis . DP/PT/Radials 1+ and equal bilaterally.  Labs   Lab Results  Component Value Date   WBC 3.9* 04/21/2012   HGB 11.9* 04/21/2012   HCT 40.8 04/21/2012   MCV 96.5 04/21/2012   PLT 116* 04/21/2012     Lab 04/21/12 1147  NA 144  K 2.8*  CL 99  CO2 35*  BUN 28*  CREATININE 0.85  CALCIUM 9.6  PROT --  BILITOT --  ALKPHOS --  ALT --  AST --  GLUCOSE 127*     Radiology/Studies  ECG Afib with antlat st dep, not acutely worsened.  ASSESSMENT AND PLAN  1.  Acute on chronic diastolic and R Heart CHF - Pt presents with a several month history of progressive wkns of unclear origin - likely multifactorial - and a several day h/o dyspnea and weight gain.  She continues to have dyspnea despite outpt adjustment of diuretics.  Will admit to tele, add IV lasix.  Reduce bb dose in setting of borderline hypotn and bradycardia.  Check CE given h/o CAD.  F/U echo.  May benefit from CHF team eval.  2. Afib-  HR  Currently in the 50s. She is on metoprolol 50mg  BID - down-titrate with hold parameters.  3. Borderline hypotn:  Adjust bb.  Follow closely with diuresis.  4.  CAD:  No chest pain.  Check CE.  ECG stable.  5.  Hypokalemia:  Replace.  Follow.   Signed, Nicolasa Ducking, NP 04/21/2012, 1:50 PM  I have personally seen and examined this patient with Ward Givens, NP. I agree with the assessment and plan as  outlined above. Her weakness and SOB is multi-factorial. Will diurese with IV Lasix. Will reduce dose of beta blocker with hypotension and bradycardia. She will need to be seen by the CHF team before discharge.   Jodi Kappes 2:31 PM 04/21/2012

## 2012-04-21 NOTE — Progress Notes (Signed)
ANTICOAGULATION CONSULT NOTE - Initial Consult  Pharmacy Consult for Coumadin Indication: atrial fibrillation  Allergies  Allergen Reactions  . Amiodarone     Severe side effects per Pt--  . Atorvastatin     REACTION: muscle pain  . Codeine     REACTION: itching  . Erythromycin Other (See Comments)    unknown  . Ezetimibe     REACTION: INTOL to Zetia w/ cough  . Meperidine Hcl Other (See Comments)    REACTION: dizziness  . Morphine Other (See Comments)    Pt states it "makes her crazy"  . Neomycin-Bacitracin Zn-Polymyx Other (See Comments)    blisters  . Ropinirole Hydrochloride     REACTION: INTOL to Requip w/ sleep paralysis  . Simvastatin     REACTION: unable to walk--muscle pain    Patient Measurements: Height: 5\' 7"  (170.2 cm) Weight: 156 lb 1.4 oz (70.8 kg) IBW/kg (Calculated) : 61.6   Vital Signs: Temp: 97.6 F (36.4 C) (06/07 1005) Temp src: Oral (06/07 1005) BP: 84/43 mmHg (06/07 1545) Pulse Rate: 66  (06/07 1545)  Labs:  Basename 04/21/12 1426 04/21/12 1147  HGB -- 11.9*  HCT -- 40.8  PLT -- 116*  APTT -- --  LABPROT 23.5* --  INR 2.05* --  HEPARINUNFRC -- --  CREATININE -- 0.85  CKTOTAL 88 --  CKMB 2.0 --  TROPONINI <0.30 --    Estimated Creatinine Clearance: 56.5 ml/min (by C-G formula based on Cr of 0.85).   Medical History: Past Medical History  Diagnosis Date  . OSA (obstructive sleep apnea)   . HTN (hypertension)   . Atrial fibrillation     paroxysmal; on coumadin  . CAD (coronary artery disease)     a. cath 7/11: LM 40%, mild plaque disease in CFX, LAD, and RCA;  b. 2011 CABG with S-LAD and S-OM done at time of Aortic dissection repair  . Chronic diastolic CHF (congestive heart failure)     a. 08/2011 Echo: EF 55-60%, PASP  . Dissection of aorta, thoracic     a. Type A; s/p repair 7/11 with aortic root repair and CABG x 2   . ASD (atrial septal defect)     a. s/p repair 1982 at [facility]  . Right heart failure     a. 2/2  TR and RV dysfxn;  b. echo 4/12: EF 60%, mild LVH, mild AI, mild MR, mod LAE, mod RVE with mod dec. RVSF, mod RAE, mod to severe TR, PASP 58;  c. right heart cath 4/12:  RA mean 8, RV 46/1 with mean 6, PA 45/13 with mean 26, PCWP mean 14, CO 3.68, CI 2.1 (no sig pulmon HTN)  . GERD (gastroesophageal reflux disease)   . IBS (irritable bowel syndrome)   . Esophageal stricture   . Colonic polyp   . Peripheral neuropathy   . Back pain   . Fibromyalgia   . Anxiety   . HLD (hyperlipidemia)   . Borderline diabetes   . History of TIAs   . DJD (degenerative joint disease)   . Normocytic anemia   . Thrombocytopenia   . Macular degeneration   . Depression   . Esophageal dysmotilities     Medications:  Acetaminophen, Ocuvite, Vit D, Docusate, Iron, Lasix, Gabapentin, Glucosamine-chondroitin, Metolazone, Metoprolol, Omeprazole, Miralax, KCl, Zoloft, Vit C, Tramadol, Coumadin 5 mg daily except for 2.5mg  on Monday  Assessment: 77 y.o. female presents with SOB/generalized weakness. On coumadin PTA for afib. Baseline INR 2.05.   Goal  of Therapy:  INR 2-3 Monitor platelets by anticoagulation protocol: Yes   Plan:  1. Daily PT/INR 2. Continue home dose of coumadin - 5mg  daily except for 2.5mg  on Mondays  Christoper Fabian, PharmD, BCPS Clinical pharmacist, pager [phone] 04/21/2012,4:05 PM

## 2012-04-21 NOTE — Progress Notes (Signed)
  Echocardiogram 2D Echocardiogram has been performed.  [name] 04/21/2012, 4:06 PM

## 2012-04-21 NOTE — ED Provider Notes (Signed)
History     CSN: [id number]  Arrival date & time 04/21/12  0946   First MD Initiated Contact with Patient 04/21/12 1012      Chief Complaint  Patient presents with  . Shortness of Breath  . Leg Swelling    (Consider location/radiation/quality/duration/timing/severity/associated sxs/prior treatment) HPI... generalized weakness for several days. Level V caveat for urgent need for intervention. Son reports that she normally walks with a walker but is having difficulty getting out of bed. Also complains of shortness of breath and peripheral edema. No frank chest pain. Severity is moderate to severe.  Patient was evaluated by the cardiologist who recommended she come the emergency department  Past Medical History  Diagnosis Date  . OSA (obstructive sleep apnea)   . HTN (hypertension)   . Atrial fibrillation     paroxysmal; on coumadin  . CAD (coronary artery disease)     a. cath 7/11: LM 40%, mild plaque disease in CFX, LAD, and RCA;  b. CABG with S-LAD and S-OM done at time of Aortic dissection repair  . Diastolic heart failure   . Dissection of aorta, thoracic     a. Type A; s/p repair 7/11 with aortic root repair and CABG x 2   . ASD (atrial septal defect)     a. s/p repair 1982 at [facility]  . Right heart failure     a. 2/2 TR and RV dysfxn;  b. echo 4/12: EF 60%, mild LVH, mild AI, mild MR, mod LAE, mod RVE with mod dec. RVSF, mod RAE, mod to severe TR, PASP 58;  c. right heart cath 4/12:  RA mean 8, RV 46/1 with mean 6, PA 45/13 with mean 26, PCWP mean 14, CO 3.68, CI 2.1 (no sig pulmon HTN)  . GERD (gastroesophageal reflux disease)   . IBS (irritable bowel syndrome)   . Esophageal stricture   . Colonic polyp   . Peripheral neuropathy   . Back pain   . Fibromyalgia   . Anxiety   . HLD (hyperlipidemia)   . Borderline diabetes   . History of TIAs   . DJD (degenerative joint disease)   . Normocytic anemia   . Thrombocytopenia   . Macular degeneration   . Aneurysm 2011   Disecting aortic aneurysm  . Depression   . Atrial septal defect   . Esophageal dysmotilities     Past Surgical History  Procedure Date  . Asd repair 1982    [facility]  . Rotator cuff repair 2007    right  . Cardioversion     x 3  . Tubal ligation   . Appendectomy   . Vaginal hysterectomy 1987    A/P Repair With Cystocele and rectocele repair  . Tonsillectomy   . Oophorectomy 1994    BSO  . Pelvic laparoscopy 1994  . Emergency redo median sternotomy for hemiarch repair of acute type a aortic  dissection 06/05/2010  . Nasal hemorrhage control 12/27/2011    Procedure: EPISTAXIS CONTROL;  Surgeon: Melvenia Beam, MD;  Location: [facility] OR;  Service: ENT;  Laterality: N/A;    Family History  Problem Relation Age of Onset  . Pancreatic cancer Sister   . Osteoporosis Sister   . Lung cancer Brother     x 2  . Heart attack Brother   . Melanoma Sister   . Diabetes Mother   . Hypertension Mother   . Colon cancer Sister   . ALS Mother   . Heart disease Brother  x 2    History  Substance Use Topics  . Smoking status: Never Smoker   . Smokeless tobacco: Never Used  . Alcohol Use: No    OB History    Grav Para Term Preterm Abortions TAB SAB Ect Mult Living   4 3 3  1     3       Review of Systems  All other systems reviewed and are negative.    Allergies  Amiodarone; Atorvastatin; Codeine; Erythromycin; Ezetimibe; Meperidine hcl; Morphine; Neomycin-bacitracin zn-polymyx; Ropinirole hydrochloride; and Simvastatin  Home Medications   Current Outpatient Rx  Name Route Sig Dispense Refill  . ACETAMINOPHEN 325 MG PO TABS Oral Take 650 mg by mouth as needed. For pain    . AMOXICILLIN 500 MG PO TABS  Take 4 tablets by mouth one hour prior to dental procedure 8 tablet 0  . OCUVITE PO TABS Oral Take 1 tablet by mouth 2 (two) times daily.      Marland Kitchen VITAMIN D 1000 UNITS PO TABS Oral Take 1,000 Units by mouth daily.     Marland Kitchen DOCUSATE SODIUM 100 MG PO CAPS Oral Take 100 mg by mouth 2  (two) times daily.    Marland Kitchen FERROUS SULFATE 325 (65 FE) MG PO TABS Oral Take 325 mg by mouth daily.     . FUROSEMIDE 80 MG PO TABS Oral Take 80 mg by mouth 2 (two) times daily.    Marland Kitchen GABAPENTIN 300 MG PO CAPS  TAKE 2 CAPSULES BY MOUTH AT BEDTIME 60 capsule 4  . GLUCOSAMINE-CHONDROITIN 500-400 MG PO TABS Oral Take 1 tablet by mouth 2 (two) times daily.    Marland Kitchen METOLAZONE 2.5 MG PO TABS Oral Take 2.5 mg by mouth every other day.     Marland Kitchen METOPROLOL TARTRATE 50 MG PO TABS Oral Take 50 mg by mouth 2 (two) times daily.    Marland Kitchen OMEPRAZOLE 20 MG PO CPDR  TAKE 1 CAPSULE BY MOUTH  TWICE A DAY 30 MINUTES BEFORE MEALS 60 capsule 0  . POLYETHYLENE GLYCOL 3350 PO PACK Oral Take 17 g by mouth daily.    Marland Kitchen POTASSIUM CHLORIDE CRYS ER 20 MEQ PO TBCR Oral Take 80-100 mEq by mouth daily. Take 4 tablets by mouth daily alternating with 5 tablets by mouth daily.    . SERTRALINE HCL 50 MG PO TABS Oral Take 1 tablet (50 mg total) by mouth every morning. 30 tablet 5  . TRAMADOL HCL 50 MG PO TABS Oral Take 1 tablet (50 mg total) by mouth 3 (three) times daily as needed. For pain 90 tablet 3  . VITAMIN C 500 MG PO TABS Oral Take 500 mg by mouth daily.     . WARFARIN SODIUM 5 MG PO TABS Oral Take 2.5-5 mg by mouth as directed. 2.5 mg (1/2 tab) on Monday takes 5 mg all other days      BP 91/66  Pulse 40  Temp(Src) 97.6 F (36.4 C) (Oral)  Resp 15  SpO2 95%  Physical Exam  Nursing note and vitals reviewed. Constitutional: She is oriented to person, place, and time.       Pale, feeble  HENT:  Head: Normocephalic and atraumatic.  Eyes: Conjunctivae and EOM are normal. Pupils are equal, round, and reactive to light.  Neck: Normal range of motion. Neck supple.  Cardiovascular: Normal rate and regular rhythm.   Pulmonary/Chest: Effort normal and breath sounds normal.  Abdominal: Soft. Bowel sounds are normal.  Musculoskeletal: Normal range of motion.  Neurological: She  is alert and oriented to person, place, and time.  Skin:        3+ peripheral edema  Psychiatric: She has a normal mood and affect.    ED Course  Procedures (including critical care time)  Labs Reviewed  CBC - Abnormal; Notable for the following:    WBC 3.9 (*)    Hemoglobin 11.9 (*)    MCHC 29.2 (*)    RDW 22.7 (*)    Platelets 116 (*) PLATELET COUNT CONFIRMED BY SMEAR   All other components within normal limits  DIFFERENTIAL - Abnormal; Notable for the following:    Lymphs Abs 0.6 (*)    All other components within normal limits  BASIC METABOLIC PANEL - Abnormal; Notable for the following:    Potassium 2.8 (*)    CO2 35 (*)    Glucose, Bld 127 (*)    BUN 28 (*)    GFR calc non Af Amer 66 (*)    GFR calc Af Amer 76 (*)    All other components within normal limits  PRO B NATRIURETIC PEPTIDE - Abnormal; Notable for the following:    Pro B Natriuretic peptide (BNP) 4399.0 (*)    All other components within normal limits  URINALYSIS, ROUTINE W REFLEX MICROSCOPIC - Abnormal; Notable for the following:    Leukocytes, UA SMALL (*)    All other components within normal limits  URINE MICROSCOPIC-ADD ON - Abnormal; Notable for the following:    Squamous Epithelial / LPF FEW (*)    All other components within normal limits  POCT I-STAT TROPONIN I   No results found. Dg Chest Port 1 View  04/21/2012  *RADIOLOGY REPORT*  Clinical Data: Post CABG. Shortness of breath.  PORTABLE CHEST - 1 VIEW  Comparison: 04/05/2012.  Findings: Post mediastinotomy.  Prominent cardiomegaly.  Tortuous aorta (aneurysm not excluded).  Central pulmonary vascular prominence.  Left base linear scarring/atelectasis.  No gross pneumothorax.  IMPRESSION: Similar appearance to the prior exam as discussed above.  Original Report Authenticated By: Fuller Canada, M.D.    No diagnosis found.   Date: 04/21/2012  Rate: 53  Rhythm: atrial fibrillation  QRS Axis: normal  Intervals: normal  ST/T Wave abnormalities: normal  Conduction Disutrbances:none  Narrative  Interpretation:   Old EKG Reviewed: changes noted   MDM  Patient has failure to thrive. Clinically mild congestive heart failure.  Unable to ambulate at home. Will admit to the [facility] cardiology        Donnetta Hutching, MD 04/21/12 1414

## 2012-04-21 NOTE — Telephone Encounter (Signed)
I spoke with Dr Excell Seltzer and made him aware that the pt is not doing well this morning.  He does not recommend a direct admission.  He would like the pt to go into the ER for further evaluation and determine proper bed placement. I spoke with Misty Stanley and made her aware that the pt needs to go to the ER. She will have her brother take the pt to [facility].

## 2012-04-22 DIAGNOSIS — R001 Bradycardia, unspecified: Secondary | ICD-10-CM

## 2012-04-22 DIAGNOSIS — I50810 Right heart failure, unspecified: Secondary | ICD-10-CM

## 2012-04-22 LAB — CARDIAC PANEL(CRET KIN+CKTOT+MB+TROPI)
Relative Index: INVALID (ref 0.0–2.5)
Troponin I: <0.3 ng/mL (ref <0.30)

## 2012-04-22 LAB — BASIC METABOLIC PANEL
BUN: 27 mg/dL — ABNORMAL HIGH (ref 6–23)
Calcium: 9.3 mg/dL (ref 8.4–10.5)
GFR calc Af Amer: >90 mL/min (ref >90)
GFR calc non Af Amer: 82 mL/min — ABNORMAL LOW (ref >90)
Glucose, Bld: 123 mg/dL — ABNORMAL HIGH (ref 70–99)

## 2012-04-22 MED ORDER — DOFETILIDE 500 MCG PO CAPS
500.0000 ug | ORAL_CAPSULE | Freq: Two times a day (BID) | ORAL | Status: DC
  Administered 2012-04-22: 500 ug via ORAL
  Filled 2012-04-22 (×4): qty 1

## 2012-04-22 MED ORDER — MUPIROCIN 2 % EX OINT
1.0000 "application " | TOPICAL_OINTMENT | Freq: Two times a day (BID) | CUTANEOUS | Status: AC
  Administered 2012-04-22 – 2012-04-26 (×10): 1 via NASAL
  Filled 2012-04-22: qty 22

## 2012-04-22 MED ORDER — CHLORHEXIDINE GLUCONATE CLOTH 2 % EX PADS
6.0000 | MEDICATED_PAD | Freq: Every day | CUTANEOUS | Status: AC
  Administered 2012-04-22 – 2012-04-26 (×5): 6 via TOPICAL

## 2012-04-22 MED ORDER — SODIUM CHLORIDE 0.9 % IV SOLN: 250.0000 mL | INTRAVENOUS | Status: DC | PRN

## 2012-04-22 MED ORDER — SODIUM CHLORIDE 0.9 % IJ SOLN: 3.0000 mL | INTRAMUSCULAR | Status: DC | PRN

## 2012-04-22 MED ORDER — SODIUM CHLORIDE 0.9 % IJ SOLN
3.0000 mL | Freq: Two times a day (BID) | INTRAMUSCULAR | Status: DC
  Administered 2012-04-22 – 2012-04-27 (×6): 3 mL via INTRAVENOUS

## 2012-04-22 MED ORDER — CLORAZEPATE DIPOTASSIUM 7.5 MG PO TABS
7.5000 mg | ORAL_TABLET | Freq: Three times a day (TID) | ORAL | Status: DC | PRN
  Administered 2012-04-23 – 2012-04-26 (×5): 7.5 mg via ORAL
  Filled 2012-04-22 (×5): qty 2

## 2012-04-22 NOTE — Progress Notes (Signed)
Patient Name: [name]      SUBJECTIVE: Admitted with shortness of breath in the context of prior bypass surgery done at the time of aortic dissection repair. He has a history of diastolic heart failure remote ASD repair and right ventricular heart failure with moderate to severe TR and RV dysfunction. Outpatient diuresis had been augmented; it was complicated by hypokalemia. Potassium 2.8 on admission.  She is also noted to be in atrial fibrillation history of paroxysmal AF relative bradycardia. Blood pressure was low Attempt was previously put her on amiodarone which he failed to tolerate. Her QT interval is within range for consideration of half-life. She has had problems with hypokalemia currently that is corrected.  Very weak and in bed  Sob not too bad   Past Medical History  Diagnosis Date  . OSA (obstructive sleep apnea)   . HTN (hypertension)   . Atrial fibrillation     paroxysmal; on coumadin  . CAD (coronary artery disease)     a. cath 7/11: LM 40%, mild plaque disease in CFX, LAD, and RCA;  b. 2011 CABG with S-LAD and S-OM done at time of Aortic dissection repair  . Chronic diastolic CHF (congestive heart failure)     a. 08/2011 Echo: EF 55-60%, PASP  . Dissection of aorta, thoracic     a. Type A; s/p repair 7/11 with aortic root repair and CABG x 2   . ASD (atrial septal defect)     a. s/p repair [facility] at [facility]  . Right heart failure     a. 2/2 TR and RV dysfxn;  b. echo 4/12: EF 60%, mild LVH, mild AI, mild MR, mod LAE, mod RVE with mod dec. RVSF, mod RAE, mod to severe TR, PASP 58;  c. right heart cath 4/12:  RA mean 8, RV 46/1 with mean 6, PA 45/13 with mean 26, PCWP mean 14, CO 3.68, CI 2.1 (no sig pulmon HTN)  . GERD (gastroesophageal reflux disease)   . IBS (irritable bowel syndrome)   . Esophageal stricture   . Colonic polyp   . Peripheral neuropathy   . Back pain   . Fibromyalgia   . Anxiety   . HLD (hyperlipidemia)   . Borderline diabetes   .  History of TIAs   . DJD (degenerative joint disease)   . Normocytic anemia   . Thrombocytopenia   . Macular degeneration   . Depression   . Esophageal dysmotilities     PHYSICAL EXAM Filed Vitals:   04/21/12 2100 04/22/12 0141 04/22/12 0744 04/22/12 1048  BP: 108/71 101/66 115/74 112/78  Pulse: 71 69 69 90  Temp: 97.7 F (36.5 C) 97.9 F (36.6 C) 97.2 F (36.2 C)   TempSrc: Oral Oral Oral   Resp: 20 16 18    Height:      Weight:   154 lb 14.4 oz (70.262 kg)   SpO2: 97% 95% 99%     Well developed and nourished in no acute distress HENT normal Neck supple with JVP>10 Carotids brisk and full without bruits Clear Irregularly irregular rate and rhythm with controlled ventricular response, 2/6 murmurs  Abd-soft with active BS without hepatomegaly No Clubbing cyanosis  trasce edema Skin-warm and dry A & Oriented  Grossly normal sensory and motor function  TELEMETRY: Reviewed telemetry pt in af vCVR    Intake/Output Summary (Last 24 hours) at 04/22/12 1117 Last data filed at 04/22/12 1117  Gross per 24 hour  Intake   1068 ml  Output   2325 ml  Net  -1257 ml    LABS: Basic Metabolic Panel:  Lab 04/22/12 4098 04/21/12 1745 04/21/12 1426 04/21/12 1147  NA 141 143 -- 144  K 4.0 3.3* -- 2.8*  CL 101 98 -- 99  CO2 29 33* -- 35*  GLUCOSE 123* 182* -- 127*  BUN 27* 27* -- 28*  CREATININE 0.74 0.80 -- 0.85  CALCIUM 9.3 9.0 -- --  MG -- -- 2.1 --  PHOS -- -- -- --   Cardiac Enzymes:  Basename 04/22/12 0520 04/21/12 2200 04/21/12 1426  CKTOTAL 63 196* 88  CKMB 1.8 3.6 2.0  CKMBINDEX -- -- --  TROPONINI <0.30 <0.30 <0.30   CBC:  Lab 04/21/12 1147  WBC 3.9*  NEUTROABS 2.8  HGB 11.9*  HCT 40.8  MCV 96.5  PLT 116*   PROTIME:  Basename 04/22/12 0520 04/21/12 1426  LABPROT 25.3* 23.5*  INR 2.26* 2.05*   Thyroid Function Tests:  Basename 04/21/12 1426  TSH 4.688*  T4TOTAL --  T3FREE --  THYROIDAB --     ASSESSMENT AND PLAN:  Patient Active  Hospital Problem List: Atrial fibrillation (10/03/2007)  Acute on chronic diastolic heart failure (07/16/2010)  RVF (right ventricular failure)/ tricuspid regurgitation (04/22/2012) Hypotension-resolved Bradycardia-improved   Plan 1. OT PT consultation 2: Anticipate dofetilide initiation dofetilide  in the hope of to restore and maintain sinus rhythm 3. continue gentle diuresis      Signed, Sherryl Manges MD  04/22/2012

## 2012-04-22 NOTE — Progress Notes (Signed)
ANTICOAGULATION CONSULT NOTE - Follow Up Consult  Pharmacy Consult for Coumadin Indication: atrial fibrillation  Allergies  Allergen Reactions  . Amiodarone     Severe side effects per Pt--  . Atorvastatin     REACTION: muscle pain  . Codeine     REACTION: itching  . Erythromycin Other (See Comments)    unknown  . Ezetimibe     REACTION: INTOL to Zetia w/ cough  . Meperidine Hcl Other (See Comments)    REACTION: dizziness  . Morphine Other (See Comments)    Pt states it "makes her crazy"  . Neomycin-Bacitracin Zn-Polymyx Other (See Comments)    blisters  . Ropinirole Hydrochloride     REACTION: INTOL to Requip w/ sleep paralysis  . Simvastatin     REACTION: unable to walk--muscle pain    Patient Measurements: Height: 5\' 7"  (170.2 cm) Weight: 154 lb 14.4 oz (70.262 kg) (scale a) IBW/kg (Calculated) : 61.6   Vital Signs: Temp: 97.2 F (36.2 C) (06/08 0744) Temp src: Oral (06/08 0744) BP: 115/74 mmHg (06/08 0744) Pulse Rate: 69  (06/08 0744)  Labs:  Basename 04/22/12 0520 04/21/12 2200 04/21/12 1745 04/21/12 1426 04/21/12 1147  HGB -- -- -- -- 11.9*  HCT -- -- -- -- 40.8  PLT -- -- -- -- 116*  APTT -- -- -- -- --  LABPROT 25.3* -- -- 23.5* --  INR 2.26* -- -- 2.05* --  HEPARINUNFRC -- -- -- -- --  CREATININE 0.74 -- 0.80 -- 0.85  CKTOTAL 63 196* -- 88 --  CKMB 1.8 3.6 -- 2.0 --  TROPONINI <0.30 <0.30 -- <0.30 --    Estimated Creatinine Clearance: 60 ml/min (by C-G formula based on Cr of 0.74).   Medications:  Scheduled:    . beta carotene w/minerals  1 tablet Oral BID  . Chlorhexidine Gluconate Cloth  6 each Topical Q0600  . cholecalciferol  1,000 Units Oral Daily  . docusate sodium  100 mg Oral BID  . ferrous sulfate  325 mg Oral Daily  . furosemide  80 mg Intravenous BID  . gabapentin  600 mg Oral QHS  . metoprolol  12.5 mg Oral BID  . mupirocin ointment  1 application Nasal BID  . pantoprazole  40 mg Oral Q1200  . polyethylene glycol  17 g Oral  Daily  . potassium chloride  40 mEq Oral Once  . potassium chloride SA  40 mEq Oral TID  . sertraline  50 mg Oral q morning - 10a  . sodium chloride  3 mL Intravenous Q12H  . vitamin C  500 mg Oral Daily  . warfarin  2.5 mg Oral Q Mon-1800  . warfarin  5 mg Oral Custom  . Warfarin - Pharmacist Dosing Inpatient   Does not apply q1800   Infusions:   PRN: sodium chloride, acetaminophen, clorazepate, ondansetron (ZOFRAN) IV, sodium chloride, traMADol, DISCONTD: acetaminophen  Assessment: 77 y.o. female presents with SOB/generalized weakness. On coumadin PTA for afib. INR remains therapeutic on home dose of coumadin 5mg  daily except 2.5mg  on Monday. No bleeding noted in documentation.   Goal of Therapy:  INR 2-3 Monitor platelets by anticoagulation protocol: Yes   Plan:  1. Continue home dose of coumadin 5mg  daily except 2.5mg  on Monday 2. F/u INR tomorrow and if ok will change to q3day INR  Thank you,  Brett Fairy, PharmD Pager: [phone]  04/22/2012 8:22 AM

## 2012-04-22 NOTE — Significant Event (Signed)
Pt brady down to 37 (non-sustained ) & 46 ( non-sustained). Had a pause of 2.01. Lucile Crater called. Told to put events in progress notes.

## 2012-04-22 NOTE — Progress Notes (Signed)
Pharmacy Consult for Dofetilide (Tikosyn) Iniation  Admit Complaint: 77 y.o. female admitted 04/21/2012 with atrial fibrillation to be initiated on dofetilide.   Assessment:  Patient Exclusion Criteria: If any screening criteria checked as "Yes", then  patient  should NOT receive dofetilide until criteria item is corrected. If "Yes" please indicate correction plan.  YES  NO Patient  Exclusion Criteria Correction Plan  []  [x]  Baseline QTc interval is greater than or equal to 440 msec. IF above YES box checked dofetilide contraindicated unless patient has ICD; then may proceed if QTc 500-550 msec or with known ventricular conduction abnormalities may proceed with QTc 550-600 msec. QTc =     []  [x]  Magnesium level is less than 1.8 mEq/l : Last magnesium:  Lab Results  Component Value Date   MG 2.1 04/21/2012         []  [x]  Potassium level is less than 4 mEq/l : Last potassium:  Lab Results  Component Value Date   K 4.0 04/22/2012         []  [x]  Patient is known or suspected to have a digoxin level greater than 2 ng/ml: No results found for this basename: DIGOXIN      []  [x]  Creatinine clearance less than 20 ml/min (calculated using Cockcroft-Gault, actual body weight and serum creatinine): Estimated Creatinine Clearance: 60 ml/min (by C-G formula based on Cr of 0.74).    []  [x]  Patient has received drugs known to prolong the QT intervals within the last 48 hours(phenothiazines, tricyclics or tetracyclic antidepressants, erythromycin, H-1 antihistamines, cisapride).   []  [x]  Patient received a dose of hydrochlorothiazide (Oretic) alone or in any combination including triamterene (Dyazide, Maxzide) in the last 48 hours.   []  [x]  Patient received a medication known to increase dofetilide plasma concentrations prior to initial dofetilide dose:    Trimethoprim (Primsol, Proloprim) in the last 36 hours   Verapamil (Calan, Verelan) in the last 36 hours or a sustained release dose in the last 72  hours   Megestrol (Megace) in the last 5 days    Cimetidine (Tagamet) in the last 6 hours   Ketoconazole (Nizoral) in the last 24 hours   Itraconazole (Sporanox) in the last 48 hours    Prochlorperazine (Compazine) in the last 36 hours    []  [x]  Patient is known to have a history of torsades de pointes; congenital or acquired long QT syndromes.   []  [x]  Patient has received a Class 1 antiarrhythmic with less than 2 half-lives since last dose.   []  [x]  Patient has received amiodarone therapy in the past 3 months or amiodarone level is greater than 0.3 ng/ml.    Patient has been appropriately anticoagulated with Coumadin.  Ordering provider was confirmed at [email or web address] if they are not listed on the [facility] Authorized Prescribers list.  Goal of Therapy:  Follow renal function, electrolytes, potential drug interactions, and dose adjustment. Provide education and 1 week supply at discharge.  Plan:  1. Discontinue any active orders for: Class I antiarrhythmics, Trimethoprim (Primsol, Proloprim), Hydrochlorothiazide (Oretic) alone or in any combination including triamterene (Dyazide, Maxzide), Verapamil (Calan, Verelan), Megestrol (Megace), Cimetidine (Tagamet), Ketoconazole (Nizoral), Itraconazole (Sporanox), Prochlorperazine (Compazine).  2.  Initiate dofetilide based on renal function: Select One Calculated CrCl  Dose q12h  [x]  > 60 ml/min 500 mcg  []  40-60 ml/min 250 mcg  []  20-40 ml/min 125 mcg   3. Follow up QTc after the first 5 doses, renal function, electrolytes (K & Mg) daily x 3 days, dose adjustment,  success of initiation and facilitate 1 week discharge supply as clinically indicated.  4. Initiate Tikosyn education video (Call [phone] and ask for video # [phone]).  Thank You,  Brett Fairy, PharmD Pager: [phone]  04/22/2012 12:19 PM

## 2012-04-23 LAB — BASIC METABOLIC PANEL
GFR calc Af Amer: 77 mL/min — ABNORMAL LOW (ref >90)
GFR calc non Af Amer: 67 mL/min — ABNORMAL LOW (ref >90)
Potassium: 4.1 mEq/L (ref 3.5–5.1)
Sodium: 142 mEq/L (ref 135–145)

## 2012-04-23 LAB — PROTIME-INR
INR: 2.31 — ABNORMAL HIGH (ref 0.00–1.49)
Prothrombin Time: 25.8 seconds — ABNORMAL HIGH (ref 11.6–15.2)

## 2012-04-23 MED ORDER — DOFETILIDE 250 MCG PO CAPS
250.0000 ug | ORAL_CAPSULE | Freq: Two times a day (BID) | ORAL | Status: DC
  Administered 2012-04-23 – 2012-04-27 (×8): 250 ug via ORAL
  Filled 2012-04-23 (×11): qty 1

## 2012-04-23 MED ORDER — POLYETHYLENE GLYCOL 3350 17 G PO PACK
17.0000 g | PACK | Freq: Two times a day (BID) | ORAL | Status: DC
  Administered 2012-04-23 – 2012-04-27 (×5): 17 g via ORAL
  Filled 2012-04-23 (×9): qty 1

## 2012-04-23 NOTE — Progress Notes (Signed)
ANTICOAGULATION CONSULT NOTE - Follow Up Consult  Pharmacy Consult for Coumadin Indication: atrial fibrillation  Allergies  Allergen Reactions  . Amiodarone     Severe side effects per Pt--  . Atorvastatin     REACTION: muscle pain  . Codeine     REACTION: itching  . Erythromycin Other (See Comments)    unknown  . Ezetimibe     REACTION: INTOL to Zetia w/ cough  . Meperidine Hcl Other (See Comments)    REACTION: dizziness  . Morphine Other (See Comments)    Pt states it "makes her crazy"  . Neomycin-Bacitracin Zn-Polymyx Other (See Comments)    blisters  . Ropinirole Hydrochloride     REACTION: INTOL to Requip w/ sleep paralysis  . Simvastatin     REACTION: unable to walk--muscle pain    Patient Measurements: Height: 5\' 7"  (170.2 cm) Weight: 154 lb 14.4 oz (70.262 kg) (scale a) IBW/kg (Calculated) : 61.6   Vital Signs: Temp: 97.7 F (36.5 C) (06/09 0441) Temp src: Oral (06/09 0441) BP: 120/77 mmHg (06/09 0441) Pulse Rate: 63  (06/09 0441)  Labs:  Basename 04/23/12 0645 04/22/12 0520 04/21/12 2200 04/21/12 1745 04/21/12 1426 04/21/12 1147  HGB -- -- -- -- -- 11.9*  HCT -- -- -- -- -- 40.8  PLT -- -- -- -- -- 116*  APTT -- -- -- -- -- --  LABPROT 25.8* 25.3* -- -- 23.5* --  INR 2.31* 2.26* -- -- 2.05* --  HEPARINUNFRC -- -- -- -- -- --  CREATININE 0.84 0.74 -- 0.80 -- --  CKTOTAL -- 63 196* -- 88 --  CKMB -- 1.8 3.6 -- 2.0 --  TROPONINI -- <0.30 <0.30 -- <0.30 --    Estimated Creatinine Clearance: 57.1 ml/min (by C-G formula based on Cr of 0.84).   Medications:  Scheduled:     . beta carotene w/minerals  1 tablet Oral BID  . Chlorhexidine Gluconate Cloth  6 each Topical Q0600  . cholecalciferol  1,000 Units Oral Daily  . docusate sodium  100 mg Oral BID  . dofetilide  500 mcg Oral Q12H  . ferrous sulfate  325 mg Oral Daily  . furosemide  80 mg Intravenous BID  . gabapentin  600 mg Oral QHS  . metoprolol  12.5 mg Oral BID  . mupirocin ointment  1  application Nasal BID  . pantoprazole  40 mg Oral Q1200  . polyethylene glycol  17 g Oral Daily  . potassium chloride SA  40 mEq Oral TID  . sertraline  50 mg Oral q morning - 10a  . sodium chloride  3 mL Intravenous Q12H  . sodium chloride  3 mL Intravenous Q12H  . vitamin C  500 mg Oral Daily  . warfarin  2.5 mg Oral Q Mon-1800  . warfarin  5 mg Oral Custom  . Warfarin - Pharmacist Dosing Inpatient   Does not apply q1800   Infusions:   PRN: sodium chloride, sodium chloride, acetaminophen, clorazepate, ondansetron (ZOFRAN) IV, sodium chloride, sodium chloride, traMADol  Assessment: 77 y.o. female presents with SOB/generalized weakness. On coumadin PTA for afib. INR remains therapeutic on home dose of coumadin 5mg  daily except 2.5mg  on Monday. No bleeding noted in documentation.   Goal of Therapy:  INR 2-3 Monitor platelets by anticoagulation protocol: Yes   Plan:  1. Continue home dose of coumadin 5mg  daily except 2.5mg  on Monday 2. F/u INR tomorrow and if ok consider change to MWF INRs  Thank you,  Jaysiah Marchetta  Bufford Buttner, PharmD Pager: [phone]  04/23/2012 9:49 AM

## 2012-04-23 NOTE — Progress Notes (Signed)
Patient Name: [name]      SUBJECTIVE: Admitted with shortness of breath in the context of prior bypass surgery done at the time of aortic dissection repair. He has a history of diastolic heart failure remote ASD repair and right ventricular heart failure with moderate to severe TR and RV dysfunction. Outpatient diuresis had been augmented; it was complicated by hypokalemia. Potassium 2.8 on admission.  She is also noted to be in atrial fibrillation history of paroxysmal AF relative bradycardia. Blood pressure was low Attempt was previously put her on amiodarone which he failed to tolerate. Her QT interval is within range for consideration of half-life. She has had problems with hypokalemia currently that is corrected.  Very weak and in bed  Sob not too bad   Past Medical History  Diagnosis Date  . OSA (obstructive sleep apnea)   . HTN (hypertension)   . Atrial fibrillation     paroxysmal; on coumadin  . CAD (coronary artery disease)     a. cath 7/11: LM 40%, mild plaque disease in CFX, LAD, and RCA;  b. 2011 CABG with S-LAD and S-OM done at time of Aortic dissection repair  . Chronic diastolic CHF (congestive heart failure)     a. 08/2011 Echo: EF 55-60%, PASP  . Dissection of aorta, thoracic     a. Type A; s/p repair 7/11 with aortic root repair and CABG x 2   . ASD (atrial septal defect)     a. s/p repair [facility] at [facility]  . Right heart failure     a. 2/2 TR and RV dysfxn;  b. echo 4/12: EF 60%, mild LVH, mild AI, mild MR, mod LAE, mod RVE with mod dec. RVSF, mod RAE, mod to severe TR, PASP 58;  c. right heart cath 4/12:  RA mean 8, RV 46/1 with mean 6, PA 45/13 with mean 26, PCWP mean 14, CO 3.68, CI 2.1 (no sig pulmon HTN)  . GERD (gastroesophageal reflux disease)   . IBS (irritable bowel syndrome)   . Esophageal stricture   . Colonic polyp   . Peripheral neuropathy   . Back pain   . Fibromyalgia   . Anxiety   . HLD (hyperlipidemia)   . Borderline diabetes   .  History of TIAs   . DJD (degenerative joint disease)   . Normocytic anemia   . Thrombocytopenia   . Macular degeneration   . Depression   . Esophageal dysmotilities     PHYSICAL EXAM Filed Vitals:   04/22/12 1451 04/22/12 2204 04/23/12 0303 04/23/12 0441  BP: 92/65 100/65 102/78 120/77  Pulse: 64 66 64 63  Temp: 97.8 F (36.6 C) 97.6 F (36.4 C) 98 F (36.7 C) 97.7 F (36.5 C)  TempSrc: Oral Oral Oral Oral  Resp: 18 18 18 18   Height:      Weight:      SpO2: 95% 95% 95% 95%    Well developed and nourished in no acute distress HENT normal Neck supple with JVP>10 Carotids brisk and full without bruits Clear Irregularly irregular rate and rhythm with controlled ventricular response, 2/6 murmurs  Abd-soft with active BS without hepatomegaly No Clubbing cyanosis  trasce edema Skin-warm and dry A & Oriented  Grossly normal sensory and motor function  TELEMETRY: Reviewed telemetry pt in af vCVR    Intake/Output Summary (Last 24 hours) at 04/23/12 0956 Last data filed at 04/23/12 0700  Gross per 24 hour  Intake    540 ml  Output   2976 ml  Net  -2436 ml    LABS: Basic Metabolic Panel:  Lab 04/23/12 1610 04/22/12 0520 04/21/12 1745 04/21/12 1426 04/21/12 1147  NA 142 141 143 -- 144  K 4.1 4.0 3.3* -- 2.8*  CL 102 101 98 -- 99  CO2 32 29 33* -- 35*  GLUCOSE 112* 123* 182* -- 127*  BUN 27* 27* 27* -- 28*  CREATININE 0.84 0.74 0.80 -- 0.85  CALCIUM 9.6 9.3 -- -- --  MG -- -- -- 2.1 --  PHOS -- -- -- -- --   Cardiac Enzymes:  Basename 04/22/12 0520 04/21/12 2200 04/21/12 1426  CKTOTAL 63 196* 88  CKMB 1.8 3.6 2.0  CKMBINDEX -- -- --  TROPONINI <0.30 <0.30 <0.30   CBC:  Lab 04/21/12 1147  WBC 3.9*  NEUTROABS 2.8  HGB 11.9*  HCT 40.8  MCV 96.5  PLT 116*   PROTIME:  Basename 04/23/12 0645 04/22/12 0520 04/21/12 1426  LABPROT 25.8* 25.3* 23.5*  INR 2.31* 2.26* 2.05*   Thyroid Function Tests:  Basename 04/21/12 1426  TSH 4.688*  T4TOTAL --    T3FREE --  THYROIDAB --   ECG  QT 507 msec  ASSESSMENT AND PLAN:  Patient Active Hospital Problem List: Atrial fibrillation>>Sinus Rhtym  converted on dofetilide   Acute on chronic diastolic heart failure (07/16/2010)  RVF (right ventricular failure)/ tricuspid regurgitation (04/22/2012) Hypotension-resolved Bradycardia-improved   Plan 1. OT /PT consultation 2: Continue  dofetilide initiation will have to decrease the dose 500-250 because of QT prolongation  3. Continue  diuresis      Signed, Sherryl Manges MD  04/23/2012

## 2012-04-24 LAB — BASIC METABOLIC PANEL
Calcium: 9.5 mg/dL (ref 8.4–10.5)
GFR calc non Af Amer: 66 mL/min — ABNORMAL LOW (ref >90)
Sodium: 142 mEq/L (ref 135–145)

## 2012-04-24 LAB — PROTIME-INR
INR: 2.23 — ABNORMAL HIGH (ref 0.00–1.49)
Prothrombin Time: 25.1 seconds — ABNORMAL HIGH (ref 11.6–15.2)

## 2012-04-24 NOTE — Progress Notes (Signed)
    Subjective:  Breathing better. No chest pain. Still feels weak, especially in legs.  Objective:  Vital Signs in the last 24 hours: Temp:  [97.4 F (36.3 C)-98 F (36.7 C)] 97.4 F (36.3 C) (06/10 1359) Pulse Rate:  [61-70] 61  (06/10 1359) Resp:  [18-20] 20  (06/10 1359) BP: (98-114)/(63-72) 98/63 mmHg (06/10 1359) SpO2:  [94 %-99 %] 97 % (06/10 1359) Weight:  [69.1 kg (152 lb 5.4 oz)] 69.1 kg (152 lb 5.4 oz) (06/10 0501)  Intake/Output from previous day: 06/09 0701 - 06/10 0700 In: 580 [P.O.:580] Out: 2101 [Urine:2100; Stool:1]  Physical Exam: Pt is alert and oriented, NAD HEENT: normal Neck: JVP - giant V Waves  Lungs: CTA bilaterally CV: RRR with 3/6 holosystolic murmur at the LSB Abd: soft, NT, Positive BS Ext: trace edema, distal pulses intact and equal Skin: warm/dry no rash  Lab Results: No results found for this basename: WBC:2,HGB:2,PLT:2 in the last 72 hours  Basename 04/24/12 0630 04/23/12 0645  NA 142 142  K 4.3 4.1  CL 102 102  CO2 30 32  GLUCOSE 105* 112*  BUN 26* 27*  CREATININE 0.85 0.84    Basename 04/22/12 0520 04/21/12 2200  TROPONINI <0.30 <0.30   Tele: sinus rhythm  Assessment/Plan:  1. Acute on chronic right-sided CHF 2. Atrial fibrillation - maintaining sinus rhythm on Tikosyn 3. Hypokalemia - resolved  EKG reviewed and QTc is stable at 492 ms - continue same tikosyn dose. Start physical therapy. Otherwise continue same meds. Probably will transition to oral lasix tomorrow.  Tonny Bollman, M.D. 04/24/2012, 6:42 PM

## 2012-04-24 NOTE — Progress Notes (Signed)
ANTICOAGULATION CONSULT NOTE - Follow Up Consult  Pharmacy Consult for Coumadin Indication: atrial fibrillation  Allergies  Allergen Reactions  . Amiodarone     Severe side effects per Pt--  . Atorvastatin     REACTION: muscle pain  . Codeine     REACTION: itching  . Erythromycin Other (See Comments)    unknown  . Ezetimibe     REACTION: INTOL to Zetia w/ cough  . Meperidine Hcl Other (See Comments)    REACTION: dizziness  . Morphine Other (See Comments)    Pt states it "makes her crazy"  . Neomycin-Bacitracin Zn-Polymyx Other (See Comments)    blisters  . Ropinirole Hydrochloride     REACTION: INTOL to Requip w/ sleep paralysis  . Simvastatin     REACTION: unable to walk--muscle pain    Patient Measurements: Height: 5\' 7"  (170.2 cm) Weight: 152 lb 5.4 oz (69.1 kg) IBW/kg (Calculated) : 61.6   Vital Signs: Temp: 97.5 F (36.4 C) (06/10 0501) Temp src: Oral (06/10 0501) BP: 114/69 mmHg (06/10 0501) Pulse Rate: 63  (06/10 0501)  Labs:  Basename 04/24/12 0630 04/23/12 0645 04/22/12 0520 04/21/12 2200 04/21/12 1426 04/21/12 1147  HGB -- -- -- -- -- 11.9*  HCT -- -- -- -- -- 40.8  PLT -- -- -- -- -- 116*  APTT -- -- -- -- -- --  LABPROT 25.1* 25.8* 25.3* -- -- --  INR 2.23* 2.31* 2.26* -- -- --  HEPARINUNFRC -- -- -- -- -- --  CREATININE 0.85 0.84 0.74 -- -- --  CKTOTAL -- -- 63 196* 88 --  CKMB -- -- 1.8 3.6 2.0 --  TROPONINI -- -- <0.30 <0.30 <0.30 --    Estimated Creatinine Clearance: 56.5 ml/min (by C-G formula based on Cr of 0.85).   Assessment: 77 y.o. female presents with SOB/generalized weakness. On coumadin PTA for afib. INR remains therapeutic on home dose of coumadin 5mg  daily except 2.5mg  on Monday. No bleeding noted in documentation.   Goal of Therapy:  INR 2-3 Monitor platelets by anticoagulation protocol: Yes   Plan:  1. Continue home dose of coumadin 5mg  daily except 2.5mg  on Monday 2. Since INR stable on home dose, change to MWF  INRs  Thank you,  Christoper Fabian, PharmD, BCPS Clinical pharmacist, pager [phone] 04/24/2012 9:13 AM

## 2012-04-24 NOTE — Progress Notes (Signed)
Utilization Review Completed.Juris Gosnell T6/08/2012   

## 2012-04-25 DIAGNOSIS — I509 Heart failure, unspecified: Secondary | ICD-10-CM

## 2012-04-25 NOTE — Care Management Note (Addendum)
    Page 1 of 1   04/27/2012     3:07:35 PM   CARE MANAGEMENT NOTE 04/27/2012  Patient:  [name], [name]   Account Number:  [id number]  Date Initiated:  04/24/2012  Documentation initiated by:  Fransico Michael  Subjective/Objective Assessment:   admitted on 04/21/12 with c/o shortness of breath and generalized weakness     Action/Plan:   IV diuretics  Tikosyn initiation   Anticipated DC Date:  04/27/2012   Anticipated DC Plan:  HOME W HOME HEALTH SERVICES      DC Planning Services  CM consult      [facility] Choice  DURABLE MEDICAL EQUIPMENT   Choice offered to / List presented to:  C-1 Patient   DME arranged  Levan Hurst      DME agency  [facility]        Status of service:  In process, will continue to follow Medicare Important Message given?   (If response is "NO", the following Medicare IM given date fields will be blank) Date Medicare IM given:   Date Additional Medicare IM given:    Discharge Disposition:    Per UR Regulation:  Reviewed for med. necessity/level of care/duration of stay  If discussed at Long Length of Stay Meetings, dates discussed:    Comments:  PCP: NADEL,SCOTT M  ContactDEXTER, SAUSER (spouse) [phone]  04/27/12-1157-J.Sarae Nicholes,RN,BSN [phone]-J.Veleria Barnhardt,RN,BSN [phone]      In to speak with patient again regarding home health services. Patient refused home health services. States, "I am doing better than before. If I need them after I get home, I will get them." Reviewed process for obtaining home health services from PCP after discharge. Patient being discharged home today. No further needs identified.  04-26-12 1433 Tomi Bamberger, [place] [phone] CM did contact [facility] on [address] and medication Tikosyn 250 mcg is available. Pharmacy did a test claim and the cost will be approximately 89.45. Pt has medicare part d on file. Will make pt aware of cost. MD at d/c please write Rx for 7 day supply  of Tikosym to be filled in the main pharmacy and the original Rx. RN please call CM to assist with 7 day supply of Tikosyn. CM order RW via [facility] for DME. RW to be delivered to room before d/c. PT recommends [facility] PT/ 24 hour supervision and pt is unsure if she wants services right now. She wanted to see how she did with PT this afternoon before she makes decision. She did state if that swhat she needs she would like to use [place] for [facility] services. CM will f/u in am.  04/25/12- 1430-J.Catharine Kettlewell,RN,BSN [phone]     Spoke with [facility] at [facility] [address] at [phone]. Tikosyn is available for patient. CM waiting on benefits check.  04/25/12-1422-J.Tyger Wichman,RN,BSN [phone]      In to see patient regarding initiation of Tikosyn. [facility] [address] in [place] for prescriptions. CM will [facility]. Spoke with patient regarding home health services. Patient reports having had [facility] in the past, but feels like she does not need HH services at this time. Informed patient that if the need for home health arose after discharge, for her to contact PCP to order services. Verbalized understanding. MD will be contacted to order rolling walker for discharge.

## 2012-04-25 NOTE — Progress Notes (Signed)
    Subjective:  No chest pain or dyspnea. No palps. Feels much better, but still weak.  Objective:  Vital Signs in the last 24 hours: Temp:  [97.4 F (36.3 C)-97.6 F (36.4 C)] 97.5 F (36.4 C) (06/11 0500) Pulse Rate:  [61-70] 64  (06/11 0500) Resp:  [20] 20  (06/11 0500) BP: (98-127)/(63-79) 127/79 mmHg (06/11 0500) SpO2:  [92 %-97 %] 92 % (06/11 0500) Weight:  [69.037 kg (152 lb 3.2 oz)] 69.037 kg (152 lb 3.2 oz) (06/11 0500)  Intake/Output from previous day: 06/10 0701 - 06/11 0700 In: 2040 [P.O.:2040] Out: 2502 [Urine:2500; Stool:2]  Physical Exam: Pt is alert and oriented, NAD HEENT: normal Neck: JVP - large v waves Lungs: CTA bilaterally CV: RRR with 3/6 systolic murmur at the LSB Abd: soft, NT, Positive BS Ext: no C/C/E, distal pulses intact and equal Skin: warm/dry no rash   Lab Results: No results found for this basename: WBC:2,HGB:2,PLT:2 in the last 72 hours  Basename 04/24/12 0630 04/23/12 0645  NA 142 142  K 4.3 4.1  CL 102 102  CO2 30 32  GLUCOSE 105* 112*  BUN 26* 27*  CREATININE 0.85 0.84   No results found for this basename: TROPONINI:2,CK,MB:2 in the last 72 hours  Tele: sinus rhythm, no arrhythmia  Assessment/Plan:  1. Acute on chronic right-sided CHF  2. Atrial fibrillation - maintaining sinus rhythm on Tikosyn  3. Hypokalemia - resolved 4. Generalized weakness  EKG reviewed and QTc is stable on tikosyn 250 mg bid - continue same. I think she is ready to transition to an oral diuretic regimen. Consider change from lasix/metolazone combination to torsemide. I'm concerned that she has had marked hypokalemia on 2 occasions now, both with increased doses of metolazone. Start physical therapy today. Anticipate discharge in about 48 hours.  Tonny Bollman, M.D. 04/25/2012, 10:16 AM

## 2012-04-25 NOTE — Evaluation (Signed)
Physical Therapy Evaluation Patient Details Name: [name] MRN: [id number] DOB: Feb 14, 1935 Today's Date: 04/25/2012 Time: 6295-2841 PT Time Calculation (min): 38 min  PT Assessment / Plan / Recommendation Clinical Impression  Pt admitted for hypokalemia with h/o CABG s/p CABG, and aortic aneurism repair. Pt presents with generalized weakness and decreased functional mobility and will benefit from skilled PT to address below impairments and reduce falls risk and caregiver burden at home.     PT Assessment  Patient needs continued PT services    Follow Up Recommendations  Home health PT;Supervision/Assistance - 24 hour    Barriers to Discharge None      lEquipment Recommendations  Rolling walker with 5" wheels    Recommendations for Other Services     Frequency Min 3X/week    Precautions / Restrictions Precautions Precautions: Fall Precaution Comments: Weakness Restrictions Weight Bearing Restrictions: No   Pertinent Vitals/Pain No complaints      Mobility  Bed Mobility Bed Mobility: Not assessed (Pt sitting EOB and wanting to go to chair) Transfers Transfers: Sit to Stand;Stand to Sit Sit to Stand: 4: Min assist;With upper extremity assist;From bed;From chair/3-in-1 Stand to Sit: 4: Min guard;With upper extremity assist;To chair/3-in-1 Details for Transfer Assistance: Cues for safe hand placement and steadying assit only Ambulation/Gait Ambulation/Gait Assistance: 4: Min guard Ambulation Distance (Feet): 80 Feet Assistive device: Rolling walker Ambulation/Gait Assistance Details: Decreased pace, soem LE weakness noted, but no LOB Gait Pattern: Decreased stride length;Shuffle Gait velocity: 0.33 Wheelchair Mobility Wheelchair Mobility: No         PT Diagnosis: Generalized weakness  PT Problem List: Decreased strength;Decreased activity tolerance;Decreased balance;Decreased mobility;Decreased knowledge of use of DME PT Treatment Interventions: Gait  training;DME instruction;Stair training;Functional mobility training;Therapeutic activities;Therapeutic exercise;Balance training;Neuromuscular re-education;Patient/family education   PT Goals Acute Rehab PT Goals PT Goal Formulation: With patient/family Time For Goal Achievement: 05/02/12 Potential to Achieve Goals: Good Pt will go Supine/Side to Sit: with modified independence;with HOB 0 degrees PT Goal: Supine/Side to Sit - Progress: Goal set today Pt will go Sit to Supine/Side: with modified independence;with HOB 0 degrees PT Goal: Sit to Supine/Side - Progress: Goal set today Pt will go Sit to Stand: with modified independence;with upper extremity assist PT Goal: Sit to Stand - Progress: Goal set today Pt will go Stand to Sit: with modified independence;with upper extremity assist PT Goal: Stand to Sit - Progress: Goal set today Pt will Ambulate: >150 feet;with supervision;with least restrictive assistive device PT Goal: Ambulate - Progress: Goal set today Pt will Go Up / Down Stairs: 3-5 stairs;with supervision;with rail(s);with least restrictive assistive device PT Goal: Up/Down Stairs - Progress: Goal set today  Visit Information  Last PT Received On: 04/25/12 Assistance Needed: +1    Subjective Data  Subjective: Feeling veryweak Patient Stated Goal: Get stronger   Prior Functioning  Home Living Lives With: Spouse Available Help at Discharge: Family;Available 24 hours/day;Available PRN/intermittently Type of Home: House Home Access: Stairs to enter [employer] of Steps: 5 Entrance Stairs-Rails: Right Home Layout: One level Bathroom Shower/Tub: Tub/shower unit;Walk-in shower (Uses tub shower with chair) Bathroom Accessibility: No (Walks sideways with RW into bathroom) Home Adaptive Equipment: Bedside commode/3-in-1;Shower chair with back;Walker - rolling;Walker - four wheeled;Hand-held shower hose (RW was pt's mother-in-laws and not in good shape) Additional  Comments: Spouse not working, but in/out during day and he is in cardiac rehab currently Prior Function Level of Independence: Independent with assistive device(s) (Uses RW prn in home, especially lately) Able to Take Stairs?:  Yes Driving: No Vocation: Retired Musician: No difficulties    Cognition  Overall Cognitive Status: Appears within functional limits for tasks assessed/performed Arousal/Alertness: Awake/alert Orientation Level: Oriented X4 / Intact Behavior During Session: WFL for tasks performed    Extremity/Trunk Assessment Right Lower Extremity Assessment RLE ROM/Strength/Tone: Deficits RLE ROM/Strength/Tone Deficits: Grossly 3/5 throughout, but pt unable to maintain with resistance  RLE Sensation: WFL - Light Touch Left Lower Extremity Assessment LLE ROM/Strength/Tone: Deficits LLE ROM/Strength/Tone Deficits: Grossly 3/5 throughout, but pt unable to maintain with resistance  LLE Sensation: WFL - Light Touch   Balance Balance Balance Assessed: Yes Static Standing Balance Static Standing - Balance Support: No upper extremity supported Static Standing - Level of Assistance: 5: Stand by assistance Static Standing - Comment/# of Minutes: during functional task' Dynamic Standing Balance Dynamic Standing - Balance Support: No upper extremity supported Dynamic Standing - Level of Assistance: 4: Min assist Dynamic Standing - Balance Activities: Lateral lean/weight shifting;Forward lean/weight shifting;Reaching for objects;Reaching across midline Dynamic Standing - Comments: gathering objects on tray  End of Session PT - End of Session Equipment Utilized During Treatment: Gait belt Activity Tolerance: Patient tolerated treatment well Patient left: in chair;with call bell/phone within reach;with family/visitor present Nurse Communication: Mobility status   Virl Cagey, [place] [phone]  04/25/2012, 12:47 PM

## 2012-04-26 MED ORDER — SPIRONOLACTONE 25 MG PO TABS
25.0000 mg | ORAL_TABLET | Freq: Every day | ORAL | Status: DC
  Administered 2012-04-26 – 2012-04-27 (×2): 25 mg via ORAL
  Filled 2012-04-26 (×2): qty 1

## 2012-04-26 MED ORDER — TORSEMIDE 20 MG PO TABS
60.0000 mg | ORAL_TABLET | Freq: Every day | ORAL | Status: DC
  Administered 2012-04-26 – 2012-04-27 (×2): 60 mg via ORAL
  Filled 2012-04-26 (×2): qty 3

## 2012-04-26 NOTE — Progress Notes (Signed)
    Subjective:  Feels much better. Was able to walk with PT yesterday and feels very optimistic about her strength and ability to walk. No chest pain. Dyspnea stable.  Objective:  Vital Signs in the last 24 hours: Temp:  [97.4 F (36.3 C)-97.8 F (36.6 C)] 97.8 F (36.6 C) (06/11 2150) Pulse Rate:  [57-67] 67  (06/11 2150) Resp:  [18] 18  (06/11 2150) BP: (96-104)/(61-68) 96/61 mmHg (06/11 2150) SpO2:  [95 %-96 %] 96 % (06/11 2150)  Intake/Output from previous day: 06/11 0701 - 06/12 0700 In: 1487 [P.O.:1420; I.V.:3; IV Piggyback:64] Out: 850 [Urine:850]  Physical Exam: Pt is alert and oriented, NAD HEENT: normal Neck: JVP - elevated with large V waves to the angle of the jaw Lungs: CTA bilaterally CV: RRR with 3/6 systolic murmur at the LSB Abd: soft, NT, Positive BS, positive hepatomegaly Ext: no C/C/E, distal pulses intact and equal Skin: warm/dry no rash   Lab Results: No results found for this basename: WBC:2,HGB:2,PLT:2 in the last 72 hours  Basename 04/24/12 0630 04/23/12 0645  NA 142 142  K 4.3 4.1  CL 102 102  CO2 30 32  GLUCOSE 105* 112*  BUN 26* 27*  CREATININE 0.85 0.84   No results found for this basename: TROPONINI:2,CK,MB:2 in the last 72 hours  Tele: personally reviewed. NSR wtihout arrhythmia  Assessment/Plan:  1. Acute on chronic right-sided CHF - greatly improved. Transition to oral diuretics. Will change from lasix and metolazone to a combination of torsemide and aldactone. BMET in am and if ok plan home tomorrow.  2. Atrial fibrillation - maintaining sinus rhythm on Tikosyn. QTc has been stable. INR therapeutic.   3. Hypokalemia - resolved   4. Generalized weakness - improved. Appreciate PT help.  5. Dispo - anticipate home tomorrow.  Tonny Bollman, M.D. 04/26/2012, 6:27 AM

## 2012-04-26 NOTE — Progress Notes (Signed)
ANTICOAGULATION CONSULT NOTE - Follow Up Consult  Pharmacy Consult for Coumadin Indication: atrial fibrillation  Allergies  Allergen Reactions  . Amiodarone     Severe side effects per Pt--  . Atorvastatin     REACTION: muscle pain  . Codeine     REACTION: itching  . Erythromycin Other (See Comments)    unknown  . Ezetimibe     REACTION: INTOL to Zetia w/ cough  . Meperidine Hcl Other (See Comments)    REACTION: dizziness  . Morphine Other (See Comments)    Pt states it "makes her crazy"  . Neomycin-Bacitracin Zn-Polymyx Other (See Comments)    blisters  . Ropinirole Hydrochloride     REACTION: INTOL to Requip w/ sleep paralysis  . Simvastatin     REACTION: unable to walk--muscle pain    Patient Measurements: Height: 5\' 7"  (170.2 cm) Weight: 149 lb 14.4 oz (67.994 kg) (scale A) IBW/kg (Calculated) : 61.6   Vital Signs: Temp: 97.7 F (36.5 C) (06/12 0648) Temp src: Oral (06/12 0648) BP: 120/78 mmHg (06/12 0648) Pulse Rate: 61  (06/12 0648)  Labs:  Basename 04/26/12 0500 04/24/12 0630  HGB -- --  HCT -- --  PLT -- --  APTT -- --  LABPROT 24.2* 25.1*  INR 2.13* 2.23*  HEPARINUNFRC -- --  CREATININE -- 0.85  CKTOTAL -- --  CKMB -- --  TROPONINI -- --    Estimated Creatinine Clearance: 56.5 ml/min (by C-G formula based on Cr of 0.85).   Assessment: 77 y.o. female presents with SOB/generalized weakness. On coumadin PTA for afib. INR remains therapeutic on home dose of coumadin 5mg  daily except 2.5mg  on Monday. No bleeding noted in documentation. Noted pt may go home tomorrow.  Goal of Therapy:  INR 2-3 Monitor platelets by anticoagulation protocol: Yes   Plan:  1. Continue home dose of coumadin 5mg  daily except 2.5mg  on Monday 2. Continue MWF INRs  Thank you,  Christoper Fabian, PharmD, BCPS Clinical pharmacist, pager [phone] 04/26/2012 9:26 AM

## 2012-04-27 LAB — CBC
HCT: 40.2 % (ref 36.0–46.0)
MCH: 29.1 pg (ref 26.0–34.0)
MCV: 97.3 fL (ref 78.0–100.0)
Platelets: 145 10*3/uL — ABNORMAL LOW (ref 150–400)
RDW: 22 % — ABNORMAL HIGH (ref 11.5–15.5)

## 2012-04-27 LAB — BASIC METABOLIC PANEL
BUN: 24 mg/dL — ABNORMAL HIGH (ref 6–23)
Calcium: 9.5 mg/dL (ref 8.4–10.5)
Creatinine, Ser: 0.97 mg/dL (ref 0.50–1.10)
GFR calc Af Amer: 65 mL/min — ABNORMAL LOW (ref >90)

## 2012-04-27 MED ORDER — DOFETILIDE 250 MCG PO CAPS: 250.0000 ug | ORAL_CAPSULE | Freq: Two times a day (BID) | ORAL | Status: DC

## 2012-04-27 MED ORDER — POTASSIUM CHLORIDE CRYS ER 20 MEQ PO TBCR: 20.0000 meq | EXTENDED_RELEASE_TABLET | Freq: Every day | ORAL | Status: DC

## 2012-04-27 MED ORDER — SPIRONOLACTONE 25 MG PO TABS: 25.0000 mg | ORAL_TABLET | Freq: Every day | ORAL | Status: DC

## 2012-04-27 MED ORDER — METOPROLOL TARTRATE 25 MG PO TABS: 12.5000 mg | ORAL_TABLET | Freq: Two times a day (BID) | ORAL | Status: DC

## 2012-04-27 MED ORDER — TORSEMIDE 20 MG PO TABS: 60.0000 mg | ORAL_TABLET | Freq: Every day | ORAL | Status: DC

## 2012-04-27 NOTE — Discharge Instructions (Signed)
No driving for one week.   keep Coumadin clinic appointment as scheduled.

## 2012-04-27 NOTE — Progress Notes (Signed)
Physical Therapy Treatment and Discharge Note.  Patient Details Name: [name] MRN: [id number] DOB: 02/01/1935 Today's Date: 04/27/2012 Time: 5329-9242 PT Time Calculation (min): 42 min  PT Assessment / Plan / Recommendation Comments on Treatment Session  Pt has met all acute PT goals and is appropriate for return to home. No HHPT required.     Follow Up Recommendations  No PT follow up    Barriers to Discharge        Equipment Recommendations  Rolling walker with 5" wheels    Recommendations for Other Services    Frequency     Plan Discharge plan needs to be updated;All goals met and education completed, patient dischaged from PT services    Precautions / Restrictions Precautions Precautions: Fall Precaution Comments: Weakness Restrictions Weight Bearing Restrictions: No    Pertinent Vitals/Pain Pt c/o R knee pain 2/10 during stair training.  SpO2 98 throughout session on room air.      Mobility  Bed Mobility Bed Mobility: Not assessed Transfers Transfers: Sit to Stand;Stand to Sit Sit to Stand: 7: Independent;From bed;With upper extremity assist Stand to Sit: 7: Independent Details for Transfer Assistance: no cueing or assistance needed.  Ambulation/Gait Ambulation/Gait Assistance: 6: Modified independent (Device/Increase time) Ambulation Distance (Feet): 200 Feet Assistive device: Rolling walker Ambulation/Gait Assistance Details: Pt purposly ambulating slowly.  Instructed pt to ambulate at her normal pace.  Pt's gait much improved.   Gait Pattern: Within Functional Limits Gait velocity: WFL Stairs: Yes Stairs Assistance: 5: Supervision;6: Modified independent (Device/Increase time) Stairs Assistance Details (indicate cue type and reason): Supervision for safety, cueing to perform step to gait vs reciprocal gait on stairs to minimize R knee pain.   Stair Management Technique: One rail Right;Forwards Number of Stairs: 5  (two trials of 5  each.) Wheelchair Mobility Wheelchair Mobility: No    Exercises     PT Diagnosis:    PT Problem List:   PT Treatment Interventions:     PT Goals Acute Rehab PT Goals PT Goal Formulation: With patient/family Time For Goal Achievement: 05/02/12 Potential to Achieve Goals: Good Pt will go Supine/Side to Sit: with modified independence;with HOB 0 degrees PT Goal: Supine/Side to Sit - Progress: Met Pt will go Sit to Supine/Side: with modified independence;with HOB 0 degrees PT Goal: Sit to Supine/Side - Progress: Met Pt will go Sit to Stand: with modified independence;with upper extremity assist PT Goal: Sit to Stand - Progress: Met Pt will go Stand to Sit: with modified independence;with upper extremity assist PT Goal: Stand to Sit - Progress: Met Pt will Ambulate: >150 feet;with supervision;with least restrictive assistive device PT Goal: Ambulate - Progress: Met Pt will Go Up / Down Stairs: 3-5 stairs;with supervision;with rail(s);with least restrictive assistive device PT Goal: Up/Down Stairs - Progress: Met  Visit Information  Last PT Received On: 04/27/12 Assistance Needed: +1    Subjective Data  Subjective: I feel good today  Patient Stated Goal: Be able to walk on track at [employer].    Cognition  Overall Cognitive Status: Appears within functional limits for tasks assessed/performed Arousal/Alertness: Awake/alert Orientation Level: Oriented X4 / Intact Behavior During Session: [facility] for tasks performed    Balance  Balance Balance Assessed: No  End of Session PT - End of Session Equipment Utilized During Treatment: Gait belt Activity Tolerance: Patient tolerated treatment well Patient left: in chair;with call bell/phone within reach;with family/visitor present Nurse Communication: Mobility status    Schyler Butikofer 04/27/2012, 2:39 PM Caterina Racine L. Adeel Guiffre DPT [phone]

## 2012-04-27 NOTE — Progress Notes (Signed)
Patient Name: [name] Date of Encounter: 04/27/2012  Active Problems:  Atrial fibrillation  Acute on chronic diastolic heart failure  RVF (right ventricular failure)/ tricuspid regurgitation  Bradycardia    SUBJECTIVE: Breathing well, no decrease in O2 sats with ambulation. No chest pain.  OBJECTIVE Filed Vitals:   04/26/12 1120 04/26/12 1415 04/26/12 2037 04/27/12 0508  BP: 128/75 108/63 114/71 116/67  Pulse: 64 56 65 59  Temp: 97.6 F (36.4 C) 97.3 F (36.3 C) 97.5 F (36.4 C) 97.5 F (36.4 C)  TempSrc: Oral Oral Oral Oral  Resp: 20 21 20 18   Height:      Weight:    149 lb 11.2 oz (67.903 kg)  SpO2: 97% 97% 98% 98%    Intake/Output Summary (Last 24 hours) at 04/27/12 0902 Last data filed at 04/27/12 0856  Gross per 24 hour  Intake    480 ml  Output    575 ml  Net    -95 ml   Weight change: -3.2 oz (-0.091 kg) Filed Weights   04/25/12 0500 04/26/12 0648 04/27/12 0508  Weight: 152 lb 3.2 oz (69.037 kg) 149 lb 14.4 oz (67.994 kg) 149 lb 11.2 oz (67.903 kg)     PHYSICAL EXAM General: Well developed, well nourished, female in no acute distress. Head: Normocephalic, atraumatic.  Neck: Supple without bruits, JVD slightly elevated. Lungs:  Resp regular and unlabored, some rales, rare rhonchi. Heart: RRR, S1, S2, no S3, S4, 2/6 murmur. Abdomen: Soft, non-tender, non-distended, BS + x 4.  Extremities: No clubbing, cyanosis, no edema.  Neuro: Alert and oriented X 3. Moves all extremities spontaneously. Psych: Normal affect.  LABS: CBC: Basename 04/27/12 0525  WBC 4.6  NEUTROABS --  HGB 12.0  HCT 40.2  MCV 97.3  PLT 145*   INR: Basename 04/26/12 0500  INR 2.13*   Basic Metabolic Panel: Basename 04/27/12 0525  NA 139  K 4.6  CL 102  CO2 28  GLUCOSE 101*  BUN 24*  CREATININE 0.97  CALCIUM 9.5  MG --  PHOS --   BNP: Pro B Natriuretic peptide (BNP)  Date/Time Value Range Status  04/21/2012 11:47 AM 4399.0* 0 - 125 pg/mL Final    02/08/2012 11:14 AM 709.0* 0.0 - 100.0 pg/mL Final    TELE:   SR    Radiology/Studies: Dg Chest Port 1 View 04/21/2012  *RADIOLOGY REPORT*  Clinical Data: Post CABG. Shortness of breath.  PORTABLE CHEST - 1 VIEW  Comparison: 04/05/2012.  Findings: Post mediastinotomy.  Prominent cardiomegaly.  Tortuous aorta (aneurysm not excluded).  Central pulmonary vascular prominence.  Left base linear scarring/atelectasis.  No gross pneumothorax.  IMPRESSION: Similar appearance to the prior exam as discussed above.  Original Report Authenticated By: Fuller Canada, M.D.    Current Medications:     . beta carotene w/minerals  1 tablet Oral BID  . cholecalciferol  1,000 Units Oral Daily  . docusate sodium  100 mg Oral BID  . dofetilide  250 mcg Oral Q12H  . ferrous sulfate  325 mg Oral Daily  . gabapentin  600 mg Oral QHS  . metoprolol  12.5 mg Oral BID  . mupirocin ointment  1 application Nasal BID  . pantoprazole  40 mg Oral Q1200  . polyethylene glycol  17 g Oral BID  . potassium chloride SA  40 mEq Oral TID  . sertraline  50 mg Oral q morning - 10a  . sodium chloride  3 mL Intravenous Q12H  . sodium  chloride  3 mL Intravenous Q12H  . spironolactone  25 mg Oral Daily  . torsemide  60 mg Oral Daily  . vitamin C  500 mg Oral Daily  . warfarin  2.5 mg Oral Q Mon-1800  . warfarin  5 mg Oral Custom  . Warfarin - Pharmacist Dosing Inpatient   Does not apply q1800      ASSESSMENT AND PLAN: Active Problems:  Atrial fibrillation  Acute on chronic diastolic heart failure  RVF (right ventricular failure)/ tricuspid regurgitation  Bradycardia  1. Acute on chronic right-sided CHF - greatly improved. Transition to oral diuretics. Was changed from lasix and metolazone to a combination of torsemide and aldactone. BMET is ok but K+ trending up; on Aldactone and K+ supp, will hold supp, otherwise MD advise. Plan: home when medically stable, possibly today.   2. Atrial fibrillation - maintaining  sinus rhythm on Tikosyn. QTc has been stable. INR therapeutic.   3. Hypokalemia - resolved   4. Generalized weakness - improved. Appreciate PT help.   5. Dispo - anticipate home   Signed, Theodore Demark , PA-C 9:02 AM 04/27/2012  Patient seen, examined. Available data reviewed. Agree with findings, assessment, and plan as outlined by Theodore Demark, PA-C. The patient was independently examined. I think she is stable for discharge. In fact, she is clinically better than I have seen her in quite some time. I think that maintaining sinus rhythm on tedious and has made it great impact. Since her diuretic regimen has been changed to include torsemide and Aldactone, I would recommend reducing her potassium supplementation 20 mEq daily. She has a metabolic panel scheduled next week. She should followup in our office within 2 weeks. Home health physical therapy should be arranged.  Tonny Bollman, M.D. 04/27/2012 12:28 PM

## 2012-04-27 NOTE — Discharge Summary (Signed)
CARDIOLOGY DISCHARGE SUMMARY   Patient ID: [name] MRN: [id number] DOB/AGE: 1935-02-16 77 y.o.  Admit date: 04/21/2012 Discharge date: 04/27/2012  Primary Discharge Diagnosis:  Acute on chronic diastolic CHF Secondary Discharge Diagnosis:  Past Medical History  Diagnosis Date  . OSA (obstructive sleep apnea)   . HTN (hypertension)   . Atrial fibrillation     paroxysmal; on coumadin  . CAD (coronary artery disease)     a. cath 7/11: LM 40%, mild plaque disease in CFX, LAD, and RCA;  b. 2011 CABG with S-LAD and S-OM done at time of Aortic dissection repair  . Chronic diastolic CHF (congestive heart failure)     a. 08/2011 Echo: EF 55-60%, PASP  . Dissection of aorta, thoracic     a. Type A; s/p repair 7/11 with aortic root repair and CABG x 2   . ASD (atrial septal defect)     a. s/p repair [facility] at [facility]  . Right heart failure     a. 2/2 TR and RV dysfxn;  b. echo 4/12: EF 60%, mild LVH, mild AI, mild MR, mod LAE, mod RVE with mod dec. RVSF, mod RAE, mod to severe TR, PASP 58;  c. right heart cath 4/12:  RA mean 8, RV 46/1 with mean 6, PA 45/13 with mean 26, PCWP mean 14, CO 3.68, CI 2.1 (no sig pulmon HTN)  . GERD (gastroesophageal reflux disease)   . IBS (irritable bowel syndrome)   . Esophageal stricture   . Colonic polyp   . Peripheral neuropathy   . Back pain   . Fibromyalgia   . Anxiety   . HLD (hyperlipidemia)   . Borderline diabetes   . History of TIAs   . DJD (degenerative joint disease)   . Normocytic anemia   . Thrombocytopenia   . Macular degeneration   . Depression   . Esophageal dysmotilities    Procedures: 2-D echocardiogram  Hospital Course: [name] is a 77 year old female with a history of atrial fibrillation and heart failure. He had increasing dyspnea on exertion as well as some palpitations. An attempt was made to manage this as an outpatient but her symptoms worsened so she came to the hospital. She was found to be in atrial  fibrillation with slow ventricular response and CHF. She also had significant hypokalemia with a potassium level of 2.8. She was also borderline hypotensive. She was admitted for further evaluation and treatment.   She have bradycardia with a heart rate that dropped into the 30s at times. Her beta blocker dose was reduced. She was having problems with weakness and was seen by physical therapy in consultation. She was diuresed gently and her respiratory status improved. Her weight decreased by 7 pounds during her hospital stay. By discharge her oxygen saturation was 98% on room air, even after ambulation but she still had some dyspnea with exertion.  It was felt that she would benefit from maintaining sinus rhythm. She was seen by Dr. Graciela Husbands from electrophysiology and Joice Lofts was recommended. This was initiated once she met all criteria. She tolerated the Tikosyn well but the dose was decreased to 250 mcg every 12 hours because of QT prolongation. Her bradycardia resolved with decreasing the beta blocker dosage. She spontaneously converted to sinus rhythm and maintain that during the rest of her hospital stay.   She remained weak but her strength and endurance were improving with physical therapy. She is to begin using a walker. She is to continue with  home health physical therapy to try to increase her strength and endurance.  Because of the significant hypokalemia, the metolazone was discontinued. She was changed from Lasix to [facility] and had Aldactone added to her medication regimen. Prior to admission, she was taking between 80 and 100 mEq of potassium daily. Currently her potassium level is 4.6 and she is to continue potassium at 20 mEq daily.   She had been on Coumadin prior to admission and her INR was therapeutic. Her Coumadin was continued and her INR was therapeutic at discharge.   On 04/27/2012, [name] was seen by Dr. Excell Seltzer. Her ambulation had improved to the point that he felt she was  stable for discharge, to follow up as an outpatient.  Labs:  Lab Results  Component Value Date   WBC 4.6 04/27/2012   HGB 12.0 04/27/2012   HCT 40.2 04/27/2012   MCV 97.3 04/27/2012   PLT 145* 04/27/2012    Lab 04/27/12 0525  NA 139  K 4.6  CL 102  CO2 28  BUN 24*  CREATININE 0.97  CALCIUM 9.5  PROT --  BILITOT --  ALKPHOS --  ALT --  AST --  GLUCOSE 101*   Lab Results  Component Value Date   CKTOTAL 63 04/22/2012   CKMB 1.8 04/22/2012   TROPONINI <0.30 04/22/2012    Lipid Panel     Component Value Date/Time   CHOL 150 09/07/2011 1051   TRIG 81.0 09/07/2011 1051   HDL 51.50 09/07/2011 1051   CHOLHDL 3 09/07/2011 1051   VLDL 16.2 09/07/2011 1051   LDLCALC 82 09/07/2011 1051    Pro B Natriuretic peptide (BNP)  Date/Time Value Range Status  04/21/2012 11:47 AM 4399.0* 0 - 125 pg/mL Final  02/08/2012 11:14 AM 709.0* 0.0 - 100.0 pg/mL Final    Basename 04/26/12 0500  INR 2.13*      Radiology: Dg Chest Port 1 View 04/21/2012  *RADIOLOGY REPORT*  Clinical Data: Post CABG. Shortness of breath.  PORTABLE CHEST - 1 VIEW  Comparison: 04/05/2012.  Findings: Post mediastinotomy.  Prominent cardiomegaly.  Tortuous aorta (aneurysm not excluded).  Central pulmonary vascular prominence.  Left base linear scarring/atelectasis.  No gross pneumothorax.  IMPRESSION: Similar appearance to the prior exam as discussed above.  Original Report Authenticated By: Fuller Canada, M.D.   EKG: 27-Apr-2012 07:55:54 Normal sinus rhythm Nonspecific ST and T wave abnormality Prolonged QT Abnormal ECG 67mm/s 20mm/mV 100Hz  8.0.1 12SL 239 CID: 1 Referred by: CHRISTOPHE MCALHANY Unconfirmed Vent. rate 60 BPM PR interval 152 ms QRS duration 94 ms QT/QTc 484/484 ms P-R-T axes 24 31 40  Echo: 04/21/2012 Study Conclusions - Left ventricle: Wall thickness was increased in a pattern of moderate LVH. Systolic function was normal. The estimated ejection fraction was in the range of 50% to 55%. - Aortic  valve: Mild regurgitation. - Mitral valve: Mild regurgitation. - Left atrium: The atrium was moderately dilated. - Right ventricle: The cavity size was moderately dilated. Systolic function was moderately reduced. - Right atrium: The atrium was moderately dilated. - Tricuspid valve: Severe regurgitation. - Pulmonary arteries: Systolic pressure was moderately increased. PA peak pressure: 53mm Hg (S).     FOLLOW UP PLANS AND APPOINTMENTS Allergies  Allergen Reactions  . Amiodarone     Severe side effects per Pt--  . Atorvastatin     REACTION: muscle pain  . Codeine     REACTION: itching  . Erythromycin Other (See Comments)    unknown  . Ezetimibe  REACTION: INTOL to Zetia w/ cough  . Meperidine Hcl Other (See Comments)    REACTION: dizziness  . Morphine Other (See Comments)    Pt states it "makes her crazy"  . Neomycin-Bacitracin Zn-Polymyx Other (See Comments)    blisters  . Ropinirole Hydrochloride     REACTION: INTOL to Requip w/ sleep paralysis  . Simvastatin     REACTION: unable to walk--muscle pain   Medication List  As of 04/27/2012 12:45 PM   STOP taking these medications         furosemide 80 MG tablet      metolazone 2.5 MG tablet         TAKE these medications         acetaminophen 325 MG tablet   Commonly known as: TYLENOL   Take 650 mg by mouth as needed. For pain      amoxicillin 500 MG tablet   Commonly known as: AMOXIL   Take 4 tablets by mouth one hour prior to dental procedure      beta carotene w/minerals tablet   Take 1 tablet by mouth 2 (two) times daily.      cholecalciferol 1000 UNITS tablet   Commonly known as: VITAMIN D   Take 1,000 Units by mouth daily.      docusate sodium 100 MG capsule   Commonly known as: COLACE   Take 100 mg by mouth 2 (two) times daily.      dofetilide 250 MCG capsule   Commonly known as: TIKOSYN   Take 1 capsule (250 mcg total) by mouth every 12 (twelve) hours.      ferrous sulfate 325 (65 FE) MG  tablet   Take 325 mg by mouth daily.      gabapentin 300 MG capsule   Commonly known as: NEURONTIN   TAKE 2 CAPSULES BY MOUTH AT BEDTIME      glucosamine-chondroitin 500-400 MG tablet   Take 1 tablet by mouth 2 (two) times daily.      metoprolol tartrate 25 MG tablet   Commonly known as: LOPRESSOR   Take 0.5 tablets (12.5 mg total) by mouth 2 (two) times daily.      omeprazole 20 MG capsule   Commonly known as: PRILOSEC   TAKE 1 CAPSULE BY MOUTH  TWICE A DAY 30 MINUTES BEFORE MEALS      polyethylene glycol packet   Commonly known as: MIRALAX / GLYCOLAX   Take 17 g by mouth daily.      potassium chloride SA 20 MEQ tablet   Commonly known as: K-DUR,KLOR-CON   Take 1 tablet (20 mEq total) by mouth daily. Take 4 tablets by mouth daily alternating with 5 tablets by mouth daily.      sertraline 50 MG tablet   Commonly known as: ZOLOFT   Take 1 tablet (50 mg total) by mouth every morning.      spironolactone 25 MG tablet   Commonly known as: ALDACTONE   Take 1 tablet (25 mg total) by mouth daily.      torsemide 20 MG tablet   Commonly known as: DEMADEX   Take 3 tablets (60 mg total) by mouth daily.      traMADol 50 MG tablet   Commonly known as: ULTRAM   Take 1 tablet (50 mg total) by mouth 3 (three) times daily as needed. For pain      vitamin C 500 MG tablet   Commonly known as: ASCORBIC ACID   Take 500 mg  by mouth daily.      warfarin 5 MG tablet   Commonly known as: COUMADIN   Take 2.5-5 mg by mouth as directed. 2.5 mg (1/2 tab) on Monday takes 5 mg all other days             Discharge Orders    Future Appointments: Provider: Department: Dept Phone: Center:   05/03/2012 1:00 PM [facility] [employer] [phone] [facility]   05/04/2012 12:00 PM [facility] [phone] [facility]   05/09/2012 10:00 AM Tonny Bollman, MD [facility] [phone] [facility]   06/12/2012 11:30 AM Lorin Picket Elayne Snare, MD [facility]  [phone] None     Follow-up Information    Follow up with Tonny Bollman, MD. Keep June 25 appointment    Contact information:   [address] [employer] [address] [place] [address] [phone]          BRING ALL MEDICATIONS WITH YOU TO FOLLOW UP APPOINTMENTS  Time spent with patient to include physician time: 45 min Signed: Theodore Demark 04/27/2012, 12:18 PM Co-Sign MD

## 2012-04-28 ENCOUNTER — Other Ambulatory Visit: Payer: Self-pay | Admitting: Pulmonary Disease

## 2012-05-03 ENCOUNTER — Ambulatory Visit (INDEPENDENT_AMBULATORY_CARE_PROVIDER_SITE_OTHER): Payer: Medicare Other | Admitting: Pharmacist

## 2012-05-03 ENCOUNTER — Other Ambulatory Visit (INDEPENDENT_AMBULATORY_CARE_PROVIDER_SITE_OTHER): Payer: Medicare Other

## 2012-05-03 DIAGNOSIS — I4891 Unspecified atrial fibrillation: Secondary | ICD-10-CM

## 2012-05-03 DIAGNOSIS — I509 Heart failure, unspecified: Secondary | ICD-10-CM

## 2012-05-03 DIAGNOSIS — Z8679 Personal history of other diseases of the circulatory system: Secondary | ICD-10-CM

## 2012-05-03 LAB — BASIC METABOLIC PANEL
BUN: 24 mg/dL — ABNORMAL HIGH (ref 6–23)
CO2: 33 mEq/L — ABNORMAL HIGH (ref 19–32)
Calcium: 9 mg/dL (ref 8.4–10.5)
Creatinine, Ser: 0.8 mg/dL (ref 0.4–1.2)

## 2012-05-03 LAB — POCT INR: INR: 1.7

## 2012-05-04 ENCOUNTER — Ambulatory Visit
Admission: RE | Admit: 2012-05-04 | Discharge: 2012-05-04 | Disposition: A | Discharge status: Home / Self Care | Payer: Medicare Other | Source: Ambulatory Visit | Attending: Pulmonary Disease | Admitting: Pulmonary Disease

## 2012-05-04 DIAGNOSIS — Z1231 Encounter for screening mammogram for malignant neoplasm of breast: Secondary | ICD-10-CM

## 2012-05-09 ENCOUNTER — Ambulatory Visit (INDEPENDENT_AMBULATORY_CARE_PROVIDER_SITE_OTHER): Payer: Medicare Other | Admitting: Cardiovascular Disease

## 2012-05-09 VITALS — BP 120/74 | HR 58 | Ht 67.0 in | Wt 153.0 lb

## 2012-05-09 DIAGNOSIS — I4891 Unspecified atrial fibrillation: Secondary | ICD-10-CM

## 2012-05-09 NOTE — Patient Instructions (Addendum)
Your physician recommends that you schedule a follow-up appointment in: 2 MONTHS with Dr Excell Seltzer  Your physician recommends that you return for lab work on 05/24/12: BMP  Your physician recommends that you continue on your current medications as directed. Please refer to the Current Medication list given to you today.

## 2012-05-11 NOTE — Discharge Summary (Signed)
Agree as above. Please see progress note this same date. 

## 2012-05-24 ENCOUNTER — Ambulatory Visit (INDEPENDENT_AMBULATORY_CARE_PROVIDER_SITE_OTHER): Payer: Medicare Other | Admitting: *Deleted

## 2012-05-24 ENCOUNTER — Other Ambulatory Visit (INDEPENDENT_AMBULATORY_CARE_PROVIDER_SITE_OTHER): Payer: Medicare Other

## 2012-05-24 ENCOUNTER — Encounter: Payer: Self-pay | Admitting: Cardiovascular Disease

## 2012-05-24 DIAGNOSIS — I4891 Unspecified atrial fibrillation: Secondary | ICD-10-CM

## 2012-05-24 DIAGNOSIS — Z8679 Personal history of other diseases of the circulatory system: Secondary | ICD-10-CM

## 2012-05-24 LAB — BASIC METABOLIC PANEL
Calcium: 9 mg/dL (ref 8.4–10.5)
Creatinine, Ser: 0.9 mg/dL (ref 0.4–1.2)
GFR: 69.37 mL/min (ref >60.00)
Glucose, Bld: 129 mg/dL — ABNORMAL HIGH (ref 70–99)
Sodium: 140 mEq/L (ref 135–145)

## 2012-05-24 LAB — POCT INR: INR: 1.5

## 2012-05-24 NOTE — Progress Notes (Signed)
HPI:  Ms. Blackstock presents for followup evaluation. She is a 77 year old woman with multiple cardiovascular problems. She was recently hospitalized with congestive heart failure and atrial fibrillation. She has severe tricuspid regurgitation and her volume management has been difficult. She has a remote history of surgical ASD repair and a more recent history of type A aortic dissection with emergency surgical repair about 2 years ago.  The patient feels remarkably well. She was started on Tikosyn and during her hospitalization and has maintained sinus rhythm. She notes a remarkable improvement in her symptoms and she has been in sinus rhythm. Her breathing is greatly improved. Her strength is much better. She denies orthopnea or PND. She's had no chest pain or palpitations  Outpatient Encounter Prescriptions as of 05/09/2012  Medication Sig Dispense Refill  . acetaminophen (TYLENOL) 325 MG tablet Take 650 mg by mouth as needed. For pain      . amoxicillin (AMOXIL) 500 MG tablet Take 4 tablets by mouth one hour prior to dental procedure  8 tablet  0  . beta carotene w/minerals (OCUVITE) tablet Take 1 tablet by mouth 2 (two) times daily.        . cholecalciferol (VITAMIN D) 1000 UNITS tablet Take 1,000 Units by mouth daily.       Marland Kitchen docusate sodium (COLACE) 100 MG capsule Take 100 mg by mouth 2 (two) times daily.      Marland Kitchen dofetilide (TIKOSYN) 250 MCG capsule Take 1 capsule (250 mcg total) by mouth every 12 (twelve) hours.  60 capsule  11  . ferrous sulfate 325 (65 FE) MG tablet Take 325 mg by mouth daily.       Marland Kitchen gabapentin (NEURONTIN) 300 MG capsule TAKE 2 CAPSULES BY MOUTH AT BEDTIME  60 capsule  4  . glucosamine-chondroitin 500-400 MG tablet Take 1 tablet by mouth 2 (two) times daily.      . metoprolol (LOPRESSOR) 25 MG tablet Take 0.5 tablets (12.5 mg total) by mouth 2 (two) times daily.  33 tablet  11  . omeprazole (PRILOSEC) 20 MG capsule TAKE 1 CAPSULE BY MOUTH  TWICE A DAY 30 MINUTES BEFORE  MEALS  60 capsule  6  . polyethylene glycol (MIRALAX / GLYCOLAX) packet Take 17 g by mouth daily.      . potassium chloride SA (K-DUR,KLOR-CON) 20 MEQ tablet Take 1 tablet (20 mEq total) by mouth daily.  30 tablet  11  . sertraline (ZOLOFT) 50 MG tablet Take 1 tablet (50 mg total) by mouth every morning.  30 tablet  5  . spironolactone (ALDACTONE) 25 MG tablet Take 1 tablet (25 mg total) by mouth daily.  30 tablet  11  . torsemide (DEMADEX) 20 MG tablet Take 3 tablets (60 mg total) by mouth daily.  90 tablet  11  . traMADol (ULTRAM) 50 MG tablet Take 1 tablet (50 mg total) by mouth 3 (three) times daily as needed. For pain  90 tablet  3  . vitamin C (ASCORBIC ACID) 500 MG tablet Take 500 mg by mouth daily.       Marland Kitchen warfarin (COUMADIN) 5 MG tablet Take 2.5-5 mg by mouth as directed. 2.5 mg (1/2 tab) on Monday takes 5 mg all other days        Allergies  Allergen Reactions  . Amiodarone     Severe side effects per Pt--  . Atorvastatin     REACTION: muscle pain  . Codeine     REACTION: itching  . Erythromycin Other (See  Comments)    unknown  . Ezetimibe     REACTION: INTOL to Zetia w/ cough  . Meperidine Hcl Other (See Comments)    REACTION: dizziness  . Morphine Other (See Comments)    Pt states it "makes her crazy"  . Neomycin-Bacitracin Zn-Polymyx Other (See Comments)    blisters  . Ropinirole Hydrochloride     REACTION: INTOL to Requip w/ sleep paralysis  . Simvastatin     REACTION: unable to walk--muscle pain    Past Medical History  Diagnosis Date  . OSA (obstructive sleep apnea)   . HTN (hypertension)   . Atrial fibrillation     paroxysmal; on coumadin  . CAD (coronary artery disease)     a. cath 7/11: LM 40%, mild plaque disease in CFX, LAD, and RCA;  b. 2011 CABG with S-LAD and S-OM done at time of Aortic dissection repair  . Chronic diastolic CHF (congestive heart failure)     a. 08/2011 Echo: EF 55-60%, PASP  . Dissection of aorta, thoracic     a. Type A;  s/p repair 7/11 with aortic root repair and CABG x 2   . ASD (atrial septal defect)     a. s/p repair [facility] at [facility]  . Right heart failure     a. 2/2 TR and RV dysfxn;  b. echo 4/12: EF 60%, mild LVH, mild AI, mild MR, mod LAE, mod RVE with mod dec. RVSF, mod RAE, mod to severe TR, PASP 58;  c. right heart cath 4/12:  RA mean 8, RV 46/1 with mean 6, PA 45/13 with mean 26, PCWP mean 14, CO 3.68, CI 2.1 (no sig pulmon HTN)  . GERD (gastroesophageal reflux disease)   . IBS (irritable bowel syndrome)   . Esophageal stricture   . Colonic polyp   . Peripheral neuropathy   . Back pain   . Fibromyalgia   . Anxiety   . HLD (hyperlipidemia)   . Borderline diabetes   . History of TIAs   . DJD (degenerative joint disease)   . Normocytic anemia   . Thrombocytopenia   . Macular degeneration   . Depression   . Esophageal dysmotilities     ROS: Negative except as per HPI  BP 120/74  Pulse 58  Ht 5\' 7"  (1.702 m)  Wt 69.4 kg (153 lb)  BMI 23.96 kg/m2  PHYSICAL EXAM: Pt is alert and oriented, NAD HEENT: normal Neck: JVP - large V waves but much less than in the past, carotids 2+= without bruits Lungs: CTA bilaterally CV: RRR with grade 2/6 holosystolic murmur at the left sternal border, quieter than previous exams  Abd: soft, NT, Positive BS, no hepatomegaly Ext: no C/C/E, distal pulses intact and equal Skin: warm/dry no rash  EKG:  Sinus rhythm 58 beats per minute, nonspecific ST and T wave abnormality unchanged from previous tracings.  ASSESSMENT AND PLAN: 1. Chronic right-sided heart failure with severe tricuspid regurgitation. The patient's diuretic regimen was changed during a recent hospitalization. She is now on a combination of torsemide and Aldactone.  Her volume status is good and she will continue on her same medications.  2. Paroxysmal atrial fibrillation. She is maintaining sinus rhythm on Tikosyn. Her QT interval is acceptable with a QTC of 463 ms.  3. history of ascending  aortic dissection status post surgical repair. The patient is followed by Dr. Elisha Headland with serial CT imaging.  Tonny Bollman 05/24/2012

## 2012-05-25 ENCOUNTER — Other Ambulatory Visit: Payer: Self-pay

## 2012-05-25 DIAGNOSIS — R0602 Shortness of breath: Secondary | ICD-10-CM

## 2012-05-25 DIAGNOSIS — I4891 Unspecified atrial fibrillation: Secondary | ICD-10-CM

## 2012-05-25 DIAGNOSIS — I509 Heart failure, unspecified: Secondary | ICD-10-CM

## 2012-06-02 ENCOUNTER — Ambulatory Visit (INDEPENDENT_AMBULATORY_CARE_PROVIDER_SITE_OTHER): Payer: Medicare Other | Admitting: *Deleted

## 2012-06-02 DIAGNOSIS — Z8679 Personal history of other diseases of the circulatory system: Secondary | ICD-10-CM

## 2012-06-02 DIAGNOSIS — I4891 Unspecified atrial fibrillation: Secondary | ICD-10-CM

## 2012-06-12 ENCOUNTER — Ambulatory Visit (INDEPENDENT_AMBULATORY_CARE_PROVIDER_SITE_OTHER): Payer: Medicare Other | Admitting: Pulmonary Disease

## 2012-06-12 ENCOUNTER — Encounter: Payer: Self-pay | Admitting: Pulmonary Disease

## 2012-06-12 VITALS — BP 120/74 | HR 63 | Temp 96.6°F | Ht 67.0 in | Wt 154.0 lb

## 2012-06-12 DIAGNOSIS — E7800 Pure hypercholesterolemia, unspecified: Secondary | ICD-10-CM

## 2012-06-12 DIAGNOSIS — K219 Gastro-esophageal reflux disease without esophagitis: Secondary | ICD-10-CM

## 2012-06-12 DIAGNOSIS — M1990 Unspecified osteoarthritis, unspecified site: Secondary | ICD-10-CM

## 2012-06-12 DIAGNOSIS — F341 Dysthymic disorder: Secondary | ICD-10-CM

## 2012-06-12 DIAGNOSIS — IMO0001 Reserved for inherently not codable concepts without codable children: Secondary | ICD-10-CM

## 2012-06-12 DIAGNOSIS — I2510 Atherosclerotic heart disease of native coronary artery without angina pectoris: Secondary | ICD-10-CM

## 2012-06-12 DIAGNOSIS — K589 Irritable bowel syndrome without diarrhea: Secondary | ICD-10-CM

## 2012-06-12 DIAGNOSIS — I5033 Acute on chronic diastolic (congestive) heart failure: Secondary | ICD-10-CM

## 2012-06-12 DIAGNOSIS — G609 Hereditary and idiopathic neuropathy, unspecified: Secondary | ICD-10-CM

## 2012-06-12 DIAGNOSIS — I872 Venous insufficiency (chronic) (peripheral): Secondary | ICD-10-CM

## 2012-06-12 DIAGNOSIS — K222 Esophageal obstruction: Secondary | ICD-10-CM

## 2012-06-12 DIAGNOSIS — I71019 Dissection of thoracic aorta, unspecified: Secondary | ICD-10-CM

## 2012-06-12 DIAGNOSIS — I7101 Dissection of thoracic aorta: Secondary | ICD-10-CM

## 2012-06-12 DIAGNOSIS — R1319 Other dysphagia: Secondary | ICD-10-CM

## 2012-06-12 DIAGNOSIS — I10 Essential (primary) hypertension: Secondary | ICD-10-CM

## 2012-06-12 DIAGNOSIS — I4891 Unspecified atrial fibrillation: Secondary | ICD-10-CM

## 2012-06-12 DIAGNOSIS — R7309 Other abnormal glucose: Secondary | ICD-10-CM

## 2012-06-12 NOTE — Patient Instructions (Addendum)
Today we updated your med list in our EPIC system...    Continue your current medications the same...  Stay as active as possible...  Call for any questions or if we can be of service in any way...  Let's plan a follow up visit in 3-4 months, sooner if needed for any reason.Marland KitchenMarland Kitchen

## 2012-06-12 NOTE — Progress Notes (Signed)
Subjective:    Patient ID: [name], female    DOB: 04-Dec-1934, 77 y.o.   MRN: [id number]  HPI 77 y/o WF here for a follow up visit... she has multiple medical problems as noted below...     Followed by DrCooper for Cards- hx ASD repair 1982 & dilated AoRoot;  HBP & PAF;  then Aortic dissection w/ emergency surg 7/11 by DrOwen w/ CABG x2 and miraculous recovery...   Followed by DrDBrodie for GI- hx dysphagia & esoph strictures dilated, also hx candida esoph... also prob w/ constip part related to rectocele...   Followed by [facility] & ENT at [facility] for severe epistaxis requiring mult surg in 2013 (see below)...  ~  September 04, 2010:  She developed sudden left sided back & chest pain 06/04/10 w/ signs of ischemia & taken to cath lab w/ AoDissection found along w/ Lmain disease- 11hr emergency operation by DrOwen w/ repair of aortic dissection & CABG x2 (SVG to LAD, & SVG to obtuse marg branch of Circ) was required to get her off the bypass machine... she made a miraculous recovery- followed closely by DrOwen & DrCooper w/ meds as below... getting stonger w/ home health rehab, etc... some recent incr trouble w/ fluid retention & they have adjusted diuretics... XRays & labs reviewed... I have offered assist in any way needed- she requests refill Rxs to [facility].  ~  September 07, 2011:  1 year ROV & she has had a lot going on >> long prob list & multisystem disease w/ mult specialists involved in her care...    Abn CXR> s/p AscAo dissection repair & bilat apical schwannomas (see CXR & CTA report); O2sat= 91% on RA & advised incr exercise at home...    HBP> on Metoprolol50Bid, Lasix80Bid, Zaroxyln2.5 2d/wk, K20Tid- ;  BP= 114/68 & labs showed K= 3.2; advised incr K20 to 2Bid everyday...    CARDIAC> followed by DrCooper & DrOwens w/ long complic cardiac hx> ASD secundum repair at [facility], CAD, PAF, Ac on Chr Diastolic CHF, & 7/11 Asc ThorAo Dissection w/ severe AI & Lmain lesion==> s/p  repair of dissecting aneurysm & CABGx2 [id number]... She knows to take Amox before dental work.    PAF> on ASA81 + Coumadin in CC, off Amio, followed by DrCooper for Cards    CHOL> on Crestor5 & her FLP looks good (see below)...    DM> borderline DM on diet alone & labs look good; continue diet + exercise...    Hx esophagitis & stricture> followed by DrDBrodie on Prilosec20Bid; last EGD 3/11ndida (Rx'd); denies heartburn, pain, dysphagia, etc...    IBS w/ constip & Rectocele> also followed by DrGottsegen; on stool softeners but she has trouble w/ evacuation & they are aware...    Left flank discomfort> she also sees Urology (prev DrKimbrough) & has f/u appt pending...    DJD/ LBP/ FM> she is actually c/o "corns" on her feet that make walking difficult she says; she will try OTC pads & if not improved she will call Podiatry...    Neuropathy> followed by Autumn Patty on Neurontin300Bid & Tramadol Prn...    Anxiety/ Depression> on Zoloft50, Tranxene7.5 Prn (ave~1/d she says), & [id number];   ~  March 07, 2012:  53mo ROV & [name] has been thru a lot since last OV>> Hx Aortic replacement & AFib prev on Coumadin> Feb2013 developed severe epistaxis:    Treated for bilat severe epistaxis 12/27/11 w/ bilat endoscopic sphenopalatine ligation & packing by DrGore at [facility];  packing removed 2/18 by DrShoemaker in his office...    Anemic due to acute blood loss> prev Hg= 10-11 range & was 7.7 when Tx to [facility]...    Re-adm 2/28 - 01/14/12 for recurrent bleeding from right nares- packed & attempted embolization was unsuccessful; transferred to [facility] for further surg...    She had further surg by ENT at [facility] but we don't have any of these records or f/u note by ENT in [place]... She has had mult f/u visits w/ DrCooper for Cards> back on Coumadin for her AFib (intol to Amio & Coumadin carefully monitored by CC), right heart failure w/ severe TR & no surg option, extremely fatigued/ exhaused/ doing very poorly  overall & she feels she has not recovered from the ENT surg/ nose bleeding episodes... CXR 4/13 showed chr severe cardiomeg, prev CABG, chr pleural blunting but no acute effusions, post-op right axillary changes, NAD... LABS 4/13:  Chems- ok w/ K=4.0 BUN=42 Creat=1.1 BS=122 A1c=7.1;  CBC- Hg=9.1 Fe=18 (4%);  TSH=4.49;  B12=849;  PROTIME= WAY TOO THIN & referred to CC- stat...  ~  Apr 05, 2012:  40mo ROV> [name] remains very weak & is having difficulty at home- fell in [place] yest when husb was out, couldn't stand, crawled to cellphone & husb ret to help her up, bruised right lat ribs & left hip area (XRays today are neg);  She notes her walking is worse since her last surg & we discussed the need for PHYSICAL THERAPY- walking, strengthening, balance;  She has Neuro f/u w/ DrLove pending;  "My goal is to get in my car and GO and DO!" she says...    She saw DrCooper for Cards f/u earlier this month & there doesn't look like there is much else he can do;  F/u CTA 5/13 showed stable prox Ao graft- see extensive report by DrYamagata (reviewed)...    She saw DrFontaine for GYN f/u- c/o labial swelling & tenderness- infected seb cyst, drained, Rx Doxy Bid, & improved... CXR 5/13 showed stable cardiomegaly & median sternotomy, atherosclerotic calcif in AO, clear lungs, osteopenia, NAD... Right RIB FILMS are neg for any fractures... Left HIP FILM is neg for fx, pos for osteopenia... LABS 5/13:  Chems- ok x HCO3=37;  CBC- improved w/ Hg=11.1 Fe=132 (38%)  ~  June 12, 2012:  81mo ROV & post hosp check> [name] is now [facility] IMPROVED having been placed on Tikosyn 6/13 Hosp for AFib/ CHF/ weakness; she converted to NSR, Lasix changed to Demadex/ Aldactone & Cards is watching very carefully;  She has been walking >49mi daily again;  She is also back on Coumadin via CC w/ only minor epistaxis noted...  She saw DrCooper 6/13 post-hosp check> doing well onTikosyn 250mg  Bid, Metoprolol 12.5mg  bid, Demadex 20mg -3tabs daily,  Aldactone25mg /d, K20/d, and Coumadin via clinic...    We reviewed prob list, meds, xrays and labs> see below for updates >> CXR 6/13 showed s/p median sternotomy, cardiomeg, tort Ao, left base scarring/ atx, NAD... LABS 6/13 Hosp reviewed...          Problem List:  SEVERE EPISTAXIS >> see above & managed by DrBates et al + ENT at [facility]...  OBSTRUCTIVE SLEEP APNEA (ICD-327.23) - sleep study 9/06 by Breck Coons showed RDI=25/hr during REM w/ desat to 77%...  ABN CXR w/ left superior mediastinal opacity ?etiology- no change back to 2006 films= benign... ~  CXR 2/11 showed cardiomeg, ectatic Ao, left superior mediast soft tissue prom w/o change (?ectasia of  great vessels or benign mass). ~  CXR 9/11 s/p median sternotomy, dissection repair,  & CABG- cardiomeg, bilat effusions & basilar atx, no ch in left superior lesion... ~  CXR 4/12 showed med sternotomy, cardiomeg, hyerinflation/ scarring, some vasc congestion... ~  CTAngio 5/12 showed stable asc ao dissection repair, rounded soft tissues masses both apicies L>R are prob neural tumors (schwannomas)... ~  CXR 4/13 & 5/13 showed chr severe cardiomeg, prev CABG, chr pleural blunting but no acute effusions, post-op right axillary changes, NAD...  HYPERTENSION (ICD-401.9) - controlled on METOPROLOL 50mg Bid, + LASIX/ ZAROXYLN/ KCl...  ~  10/12:  BP=110/70 and she denies HA, visual changes, CP, palipit, syncope... she is fatigued, SOB/ DOE, & +edema... getting PT/ rehab therapy & improving slowly... ~  4/13:  BP= 100/70 & she is very weak s/p epistaxis episodes 2/13 & 3/13 w/ surg here & at [facility]... ~  5/13:  BP= 100/70 & she is very weak... ~  7/13:  BP= 120/74 & she is feeling much better in NSR on Tikosyn...  ATRIAL FIBRILLATION (ICD-427.31) - on COUMADIN followed in the CC and very carefully monitored due to her severe epistaxis; managed by DrCooper for Cards & she is intol to Amiodarone...  CORONARY ARTERY DISEASE (ICD-414.00) - off ASA due  to severe epistaxis episodes.. ACUTE ON CHRONIC DIASTOLIC HEART FAILURE (ICD-428.33) - prev on Lasix, Zaroxylyn, & KCl; Hosp 6/13 for Tikosyn Rx & much improved in NSR on this & Demadex/ Aldactone/ KCl/ Coumadin... 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 Hx of ATRIAL SEPTAL DEFECT (ICD-745.5) - s/p ASD secundum repaired at [facility] in 1982... long-time pt of DrJoeLeBauer and now DrCooper> ~  Cardiolite 5/06 showed no ischemia & EF=65%...  ~  2DEcho 12/07 showed norm LVF, EF=55%, no regional wall motion abn, & RA/RV= mod dil... ~  Cardiac MRI 3/09 showed mod Ao root enlargement 4.3cm, norm AoV/ Arch/ Desc Ao, mod RV & biatrial enlargement from the prev ASD repair (no resid leak), mod PA enlargement, norm LV w/ EF=59%... ~  [facility] 9/09 w/ MI ruled-out, atyp CP (prob esoph spasm) & Myoview was neg- no scar or ischemia, EF= 77%... ~  2DEcho 9/10 showed sl incr LV wall thikness c/w mild LVH, norm wall motion, EF= 55-60%, gr2 DD, mild AI, mod dil LA/RA, mod-severe TR, mild pulmHTN... ~  Emergency hosp 7/11 w/ Aortic dissection, severe AI, severe TR & cath w/ 40%Lmain lesion- s/p repair & required CABG x2 as well... ~  DrOwen does CTAngiograms every 61mo... ~  [facility] 4/12 by Cards for CHF from severe TR & right heart disease> SEE 2DEcho & Right heart cath data> reviewed... ~  She saw DrCooper 10/12> felt to be reasonably stable, holding NSR, on Coumadin via CC, & f/u 2DEcho ordered & pending... ~  Labs 10/12 showed K=3.2 on K20Tid+4MTh; TCO2=37, BUN=28, Creat=0.8; pt instructed to incr K20 to [facility] everyday... ~  Serial labs avail in the results section... Labs 5/13 showed BUN=21, Creat=0.8, K=3.9 ~  HOSP 6/13 by Cards & much improved in NSR on TIKOSYN 250mg Bid, DEMADEX20mg - 3/d, ALDACTONE25mg /d, KCl 20/d; and COUMADIN via CC...  VENOUS INSUFFICIENCY (ICD-459.81) & EDEMA (ICD-782.3) - she has chr VI changes, superficial VV, and incr edema since surg 7/11... ~  3/09 ABI's  done and were WNL...  HYPERCHOLESTEROLEMIA (ICD-272.0) - on CRESTOR 5mg Qod & she wants to STOP for awhile; she has blamed Zetia for cough in the past... ~  FLP 7/08 showing TChol 143, TG 173,  HDL 32, LDL 76. ~  FLP 4/09 showed TChol 172, Tg 161, HDL 38, LDL 102 ~  FLP 7/09 showed TChol 167. Tg 148, HDL 40, LDL 98... rec- continue same. ~  FLP 1/10 showed TChol 177, TG 85, HDL 43, LDL 117 ~  FLP 2/11 showed TChol 160, TG 83, HDL 50, LDL 94 ~  FLP 10/12 on Cres5 showed TChol 150, TG 81, HDL 52, LDL 82 ~  4/13:  She wants to STOP the Cres5Qod for awhile since she is so weak...  DIABETES MELLITUS, BORDERLINE (ICD-790.29) - on diet Rx alone... ~  Hx borderline BS w/ FBS=119 in 7/08 & HgA1c=6.1 on diet alone. ~  labs 4/09 showed BS= 120, HgA1c= 6.0.Marland KitchenMarland Kitchen continue diet rx. ~  labs 7/09 showed BS= 132 ~  labs 1/10 showed BS= 120, A1c= 6.1 ~  labs 2/11 showed BS= 106 ~  Labs 10/12 showed BS= 121, A1c= 6.3.Marland KitchenMarland Kitchen Ok on diet Rx. ~  Labs 4/13 showed BS= 122, A1c= 7.1 on diet alone & we reviewed low carb diet restriction...  Hx of ESOPHAGEAL STRICTURE (ICD-530.3) - last EGD 6/04 by DrDBrodie w/ esophagitis, hx gastritis... 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 treated w/ PRILOSEC 20mg Bid... ~  [facility] Sep09 w/ atyp CP felt to be esoph spasm... GI f/u DrDBrodie w/ EGD Sep09= candida esoph, nonobstructing esoph stricture dilated. ~  2/11: presents w/ incr dysphagia & f/u GI w/ Ba Esophagram-mild stricture;  EGD 3/11 showed candida esophagitis (Rx'd);  Sonar- no gallstones, WNL.Marland KitchenMarland Kitchen  IRRITABLE BOWEL SYNDROME, HX OF (ICD-V12.79) - colonoscopy was 6/04 by DrDBrodie= neg without polyps etc... f/u 9/09 was negative- no recurrent polyps... f/u planned 9/14... ~  she reports trouble w/ constip and hx recurrent rectocele- initial surg 1987, now sees DrGottsegen for GYN & may need additional surg... ~  She saw DrDBrodie 9/12> sm vol intermit painless rectal bleeding from  anal fissures; rectocele, constip, difficulty evacuating; on stool softeners & Nupercainal oint.  UROLOGY> followed by DrKimbrough> Hx microhematuria, renal cyst, duplication & dilatation on the left, left sided pain off & on...  DEGENERATIVE JOINT DISEASE (ICD-715.90) >> on TRAMADOL 50mg  prn  BACK PAIN, LUMBAR (ICD-724.2) >> she also takes MVI, Vit D...  FIBROMYALGIA (ICD-729.1)  PERIPHERAL NEUROPATHY (ICD-356.9) - sl worse per pt and DrLove follows... she takes NEURONTIN 300mg Bid and refuses other meds... ~  1/12: she saw DrLove in follow up; felt to have a brachial plexopathy; doing satis & no change in meds; f/u planned 77yr...  ?TIA - hosp 11/07 by Autumn Patty w/ ?TIA vs sleep paralysis episode... MRIBrain w/ sm vessel dis and atherosclerotic changes... she still complains of "weak spells, my whole body gets weak in the afternoons"... these episodes last ~75min, resolve spont w/ rest or ?better after eating... she is convinced that the sleep paralysis episodes were caused by the Requip med...  ANXIETY DEPRESSION (ICD-300.4) - she states that "my nerves are shot" w/ stress from son's alcoholism & divorce, plus her husb's illness etc... started ZOLOFT 50mg /d 7/10 & improved...  ANEMIA >> due to Anemia of chronic dis & blood loss from epistaxis 2/13 & 3/13... ~  Labs from 2/13 showed baseline Hg= 10-11 range & bleed down to Hg=7.7 on Tx to [facility]... ~  Labs here 4/13 showed Hg= 9.1, Fe= 18 (4%sat); started on FeSO4 325mg  Bid (w/ VitC 500mg ) but she only took once daily... ~  Labs 5/13 showed Hg= 11.1, Fe= 132 (38%)... On Fe one  daily...  Health Maintenance: ~  GYN= DrGottsegen w/ Mammograms at [facility] (last 4/10 w/ ultrasound f/u)... Gyn does BMD's and she reports it was normal several yrs ago, not on calcium, MVI, etc... ~  Immunizations:  she reports 2010 Flu vaccine & Pneumonia shot in 2010... cna't recall last Tetanus.   Past Medical History  Diagnosis Date  . OSA (obstructive  sleep apnea)   . HTN (hypertension)   . Atrial fibrillation     paroxysmal; on coumadin  . CAD (coronary artery disease)     a. cath 7/11: LM 40%, mild plaque disease in CFX, LAD, and RCA;  b. 2011 CABG with S-LAD and S-OM done at time of Aortic dissection repair  . Chronic diastolic CHF (congestive heart failure)     a. 08/2011 Echo: EF 55-60%, PASP  . Dissection of aorta, thoracic     a. Type A; s/p repair 7/11 with aortic root repair and CABG x 2   . ASD (atrial septal defect)     a. s/p repair 1982 at [facility]  . Right heart failure     a. 2/2 TR and RV dysfxn;  b. echo 4/12: EF 60%, mild LVH, mild AI, mild MR, mod LAE, mod RVE with mod dec. RVSF, mod RAE, mod to severe TR, PASP 58;  c. right heart cath 4/12:  RA mean 8, RV 46/1 with mean 6, PA 45/13 with mean 26, PCWP mean 14, CO 3.68, CI 2.1 (no sig pulmon HTN)  . GERD (gastroesophageal reflux disease)   . IBS (irritable bowel syndrome)   . Esophageal stricture   . Colonic polyp   . Peripheral neuropathy   . Back pain   . Fibromyalgia   . Anxiety   . HLD (hyperlipidemia)   . Borderline diabetes   . History of TIAs   . DJD (degenerative joint disease)   . Normocytic anemia   . Thrombocytopenia   . Macular degeneration   . Depression   . Esophageal dysmotilities     Past Surgical History  Procedure Date  . Asd repair 1982    [facility]  . Rotator cuff repair 2007    right  . Cardioversion     x 3  . Tubal ligation   . Appendectomy   . Vaginal hysterectomy 1987    A/P Repair With Cystocele and rectocele repair  . Tonsillectomy   . Oophorectomy 1994    BSO  . Pelvic laparoscopy 1994  . Emergency redo median sternotomy for hemiarch repair of acute type a aortic  dissection 06/05/2010  . Nasal hemorrhage control 12/27/2011    Procedure: EPISTAXIS CONTROL;  Surgeon: Melvenia Beam, MD;  Location: [facility] OR;  Service: ENT;  Laterality: N/A;    Outpatient Encounter Prescriptions as of 06/12/2012  Medication Sig Dispense  Refill  . acetaminophen (TYLENOL) 325 MG tablet Take 650 mg by mouth as needed. For pain      . amoxicillin (AMOXIL) 500 MG tablet Take 4 tablets by mouth one hour prior to dental procedure  8 tablet  0  . beta carotene w/minerals (OCUVITE) tablet Take 1 tablet by mouth 2 (two) times daily.        . cholecalciferol (VITAMIN D) 1000 UNITS tablet Take 1,000 Units by mouth daily.       Marland Kitchen docusate sodium (COLACE) 100 MG capsule Take 100 mg by mouth 2 (two) times daily.      Marland Kitchen dofetilide (TIKOSYN) 250 MCG capsule Take 1 capsule (250 mcg total)  by mouth every 12 (twelve) hours.  60 capsule  11  . ferrous sulfate 325 (65 FE) MG tablet Take 325 mg by mouth daily.       Marland Kitchen gabapentin (NEURONTIN) 300 MG capsule TAKE 2 CAPSULES BY MOUTH AT BEDTIME  60 capsule  4  . glucosamine-chondroitin 500-400 MG tablet Take 1 tablet by mouth 2 (two) times daily.      . metoprolol (LOPRESSOR) 25 MG tablet Take 0.5 tablets (12.5 mg total) by mouth 2 (two) times daily.  33 tablet  11  . omeprazole (PRILOSEC) 20 MG capsule TAKE 1 CAPSULE BY MOUTH  TWICE A DAY 30 MINUTES BEFORE MEALS  60 capsule  6  . polyethylene glycol (MIRALAX / GLYCOLAX) packet Take 17 g by mouth daily.      . potassium chloride SA (K-DUR,KLOR-CON) 20 MEQ tablet Take 1 tablet (20 mEq total) by mouth 2 (two) times daily.  1 tablet  0  . sertraline (ZOLOFT) 50 MG tablet Take 1 tablet (50 mg total) by mouth every morning.  30 tablet  5  . spironolactone (ALDACTONE) 25 MG tablet Take 1 tablet (25 mg total) by mouth daily.  30 tablet  11  . torsemide (DEMADEX) 20 MG tablet Take 3 tablets (60 mg total) by mouth daily.  90 tablet  11  . traMADol (ULTRAM) 50 MG tablet Take 1 tablet (50 mg total) by mouth 3 (three) times daily as needed. For pain  90 tablet  3  . vitamin C (ASCORBIC ACID) 500 MG tablet Take 500 mg by mouth daily.       Marland Kitchen warfarin (COUMADIN) 5 MG tablet Take 2.5-5 mg by mouth as directed. 2.5 mg (1/2 tab) on Monday takes 5 mg all other days         Allergies  Allergen Reactions  . Amiodarone     Severe side effects per Pt--  . Atorvastatin     REACTION: muscle pain  . Codeine     REACTION: itching  . Erythromycin Other (See Comments)    unknown  . Ezetimibe     REACTION: INTOL to Zetia w/ cough  . Meperidine Hcl Other (See Comments)    REACTION: dizziness  . Morphine Other (See Comments)    Pt states it "makes her crazy"  . Neomycin-Bacitracin Zn-Polymyx Other (See Comments)    blisters  . Ropinirole Hydrochloride     REACTION: INTOL to Requip w/ sleep paralysis  . Simvastatin     REACTION: unable to walk--muscle pain    Current Medications, Allergies, Past Medical History, Past Surgical History, Family History, and Social History were reviewed in [employer] record.    Review of Systems         See HPI - all other systems neg except as noted...  The patient complains of decreased hearing, dyspnea on exertion, and peripheral edema.  The patient denies anorexia, fever, weight loss, weight gain, vision loss, hoarseness, chest pain, syncope, prolonged cough, headaches, hemoptysis, abdominal pain, melena, hematochezia, severe indigestion/heartburn, hematuria, incontinence, muscle weakness, suspicious skin lesions, transient blindness, difficulty walking, depression, unusual weight change, abnormal bleeding, enlarged lymph nodes, and angioedema.    Objective:   Physical Exam    WD, Thin, 77 y/o WF in NAD... GENERAL:  Alert & oriented; pleasant & cooperative... HEENT:  Conrad/AT, EOM-wnl, PERRLA, Fundi-benign, EACs-clear, TMs-wnl, NOSE-clear, THROAT-clear & wnl... NECK:  Supple w/ fairROM; no JVD; normal carotid impulses w/o bruits; no thyromegaly or nodules palpated; no lymphadenopathy. CHEST:  Median sterotomy scar, decr BS at bases w/ few bibasilar rales... HEART:  Regular Rhythm;  gr 1/6 sys murmur, without rubs or gallops detected... s/p repair ASD & Ao dissection... ABDOMEN:  Soft & nontender;  normal bowel sounds; no organomegaly or masses detected. EXT: without deformities, mild arthritic changes; no varicose veins/ +venous insuffic/ 3+ edema. NEURO:  CN's intact;  peripheral neuropathy without focal neuro deficits on exam... DERM:  No lesions noted; no rash etc...  RADIOLOGY DATA:  Reviewed in the EPIC EMR & discussed w/ the patient...  LABORATORY DATA:  Reviewed in the EPIC EMR & discussed w/ the patient...   Assessment & Plan:   Abn CXR> s/p AscAo dissection repair & bilat apical schwannomas (see CXR & CTA report); O2sat= 91% on RA & advised incr exercise at home...     HBP> on Metoprolol50Bid, Lasix80Bid, Zaroxyln2.5 2d/wk, K20 w/ 4-5-4-5 alt day regimen;  BP= 100/70 & followed closely by DrCooper...     CARDIAC> followed by DrCooper & DrOwens w/ long complic cardiac hx> ASD secundum repair at [facility], CAD, PAF, Ac on Chr Diastolic CHF, & 7/11 Asc ThorAo Dissection w/ severe AI & Lmain lesion==> s/p repair of dissecting aneurysm & CABGx2... She knows to take Amox before dental work;  known severe right heart failure w/ severe TR & no surg option> being managed by DrCooper for Cards w/ freq f/u visits but she is miserable...     AFib> on Coumadin in CC, now much improved on Tikosyn, converted to NSR, feels much better, exercising etc...     CHOL> on Crestor5Qod & her FLP looks good (see below), but she wonders about her weakness & wants to stop for awhile...     DM> Borderline DM on diet alone & labs look ok w/ BS=122, A1c=7.1; continue diet (no sweets) + no meds at present...     Hx esophagitis & stricture> followed by DrDBrodie on Prilosec20Bid; last EGD 3/11ndida (Rx'd); denies heartburn, pain, dysphagia, etc...  IBS w/ constip & Rectocele> also followed by DrGottsegen; on stool softeners but she has trouble w/ evacuation & they are aware...     Left flank discomfort> she also sees Urology (prev DrKimbrough) & has f/u appt pending...     DJD/ LBP/ FM> she is actually  c/o "corns" on her feet that make walking difficult she says; she will try OTC pads & if not improved she will call Podiatry...     Neuropathy> followed by Autumn Patty on Neurontin300Bid & Tramadol Prn...     Anxiety/ Depression> on Zoloft50, Tranxene7.5 Prn (ave~1/d she says), & Ambien10Qhs...  ANEMIA>  Hg and Fe are remarkably better...   Patient's Medications  New Prescriptions   CLORAZEPATE (TRANXENE) 7.5 MG TABLET    Take 1 tablet (7.5 mg total) by mouth 3 (three) times daily as needed for anxiety.  Previous Medications   ACETAMINOPHEN (TYLENOL) 325 MG TABLET    Take 650 mg by mouth as needed. For pain   AMOXICILLIN (AMOXIL) 500 MG TABLET    Take 4 tablets by mouth one hour prior to dental procedure   BETA CAROTENE W/MINERALS (OCUVITE) TABLET    Take 1 tablet by mouth 2 (two) times daily.     CHOLECALCIFEROL (VITAMIN D) 1000 UNITS TABLET    Take 1,000 Units by mouth daily.    DOCUSATE SODIUM (COLACE) 100 MG CAPSULE    Take 100 mg by mouth 2 (two) times daily.   DOFETILIDE (TIKOSYN) 250 MCG CAPSULE    Take 1  capsule (250 mcg total) by mouth every 12 (twelve) hours.   FERROUS SULFATE 325 (65 FE) MG TABLET    Take 325 mg by mouth daily.    GABAPENTIN (NEURONTIN) 300 MG CAPSULE    TAKE 2 CAPSULES BY MOUTH AT BEDTIME   GLUCOSAMINE-CHONDROITIN 500-400 MG TABLET    Take 1 tablet by mouth 2 (two) times daily.   METOPROLOL (LOPRESSOR) 25 MG TABLET    Take 0.5 tablets (12.5 mg total) by mouth 2 (two) times daily.   OMEPRAZOLE (PRILOSEC) 20 MG CAPSULE    TAKE 1 CAPSULE BY MOUTH  TWICE A DAY 30 MINUTES BEFORE MEALS   POLYETHYLENE GLYCOL (MIRALAX / GLYCOLAX) PACKET    Take 17 g by mouth daily.   POTASSIUM CHLORIDE SA (K-DUR,KLOR-CON) 20 MEQ TABLET    Take 1 tablet (20 mEq total) by mouth 2 (two) times daily.   SERTRALINE (ZOLOFT) 50 MG TABLET    Take 1 tablet (50 mg total) by mouth every morning.   SPIRONOLACTONE (ALDACTONE) 25 MG TABLET    Take 1 tablet (25 mg total) by mouth daily.   TORSEMIDE  (DEMADEX) 20 MG TABLET    Take 3 tablets (60 mg total) by mouth daily.   TRAMADOL (ULTRAM) 50 MG TABLET    Take 1 tablet (50 mg total) by mouth 3 (three) times daily as needed. For pain   VITAMIN C (ASCORBIC ACID) 500 MG TABLET    Take 500 mg by mouth daily.    WARFARIN (COUMADIN) 5 MG TABLET    Take 2.5-5 mg by mouth as directed. 2.5 mg (1/2 tab) on Monday takes 5 mg all other days  Modified Medications   No medications on file  Discontinued Medications   No medications on file

## 2012-06-13 ENCOUNTER — Telehealth: Payer: Self-pay | Admitting: Pulmonary Disease

## 2012-06-13 MED ORDER — CLORAZEPATE DIPOTASSIUM 7.5 MG PO TABS: 7.5000 mg | ORAL_TABLET | Freq: Three times a day (TID) | ORAL | Status: AC | PRN

## 2012-06-13 NOTE — Telephone Encounter (Signed)
Pt is requesting an rx for clorazepate 7.5mg  tid prn. This is not on her current med list and is not mentioned in last OV note. Please advise if ok to give refill. Carron Curie, CMA

## 2012-06-13 NOTE — Telephone Encounter (Signed)
Called patient and informed her that the generic tranxene (clorazepate 7.5mg  tid prn) would be called into her verified pharmacy per SN recs.  Patient verbalized understanding and nothing further needed at this time.

## 2012-06-13 NOTE — Telephone Encounter (Signed)
Per SN---ok to give a refill of the tranxene.  thanks

## 2012-06-15 ENCOUNTER — Ambulatory Visit (INDEPENDENT_AMBULATORY_CARE_PROVIDER_SITE_OTHER): Payer: Medicare Other | Admitting: *Deleted

## 2012-06-15 DIAGNOSIS — Z8679 Personal history of other diseases of the circulatory system: Secondary | ICD-10-CM

## 2012-06-15 DIAGNOSIS — I4891 Unspecified atrial fibrillation: Secondary | ICD-10-CM

## 2012-06-22 ENCOUNTER — Other Ambulatory Visit: Payer: Medicare Other

## 2012-07-03 ENCOUNTER — Other Ambulatory Visit (INDEPENDENT_AMBULATORY_CARE_PROVIDER_SITE_OTHER): Payer: Medicare Other

## 2012-07-03 ENCOUNTER — Ambulatory Visit (INDEPENDENT_AMBULATORY_CARE_PROVIDER_SITE_OTHER): Payer: Medicare Other | Admitting: Pharmacist

## 2012-07-03 DIAGNOSIS — R0602 Shortness of breath: Secondary | ICD-10-CM

## 2012-07-03 DIAGNOSIS — Z8679 Personal history of other diseases of the circulatory system: Secondary | ICD-10-CM

## 2012-07-03 DIAGNOSIS — I4891 Unspecified atrial fibrillation: Secondary | ICD-10-CM

## 2012-07-03 DIAGNOSIS — I509 Heart failure, unspecified: Secondary | ICD-10-CM

## 2012-07-03 LAB — BASIC METABOLIC PANEL
Calcium: 9.3 mg/dL (ref 8.4–10.5)
Creatinine, Ser: 0.9 mg/dL (ref 0.4–1.2)
GFR: 63.29 mL/min (ref >60.00)
Glucose, Bld: 103 mg/dL — ABNORMAL HIGH (ref 70–99)
Sodium: 140 mEq/L (ref 135–145)

## 2012-07-03 LAB — POCT INR: INR: 1.8

## 2012-07-05 ENCOUNTER — Other Ambulatory Visit: Payer: Medicare Other

## 2012-07-11 ENCOUNTER — Encounter: Payer: Self-pay | Admitting: Cardiovascular Disease

## 2012-07-11 ENCOUNTER — Ambulatory Visit (INDEPENDENT_AMBULATORY_CARE_PROVIDER_SITE_OTHER): Payer: Medicare Other | Admitting: Cardiovascular Disease

## 2012-07-11 VITALS — BP 132/72 | HR 71 | Ht 67.0 in | Wt 158.8 lb

## 2012-07-11 DIAGNOSIS — I4891 Unspecified atrial fibrillation: Secondary | ICD-10-CM

## 2012-07-11 DIAGNOSIS — I5033 Acute on chronic diastolic (congestive) heart failure: Secondary | ICD-10-CM

## 2012-07-11 NOTE — Assessment & Plan Note (Signed)
She currently has New [place] Heart Association class II symptoms. This is a vast improvement. She will continue on her current regimen which includes Aldactone and Demadex for diuretics. Her recent BNP and metabolic panel have been reviewed. I will see her back in 3 months for followup.

## 2012-07-11 NOTE — Patient Instructions (Signed)
Your physician recommends that you schedule a follow-up appointment in: 3 MONTHS with Dr Cooper  Your physician recommends that you continue on your current medications as directed. Please refer to the Current Medication list given to you today.  

## 2012-07-11 NOTE — Assessment & Plan Note (Signed)
The patient is maintaining sinus rhythm on Tikosyn. She is tolerating Coumadin for long-term anticoagulation. Continue same program. Her EKG was reviewed and she has a normal QT interval.

## 2012-07-11 NOTE — Progress Notes (Signed)
HPI:  [name] presents for followup evaluation today. She's a 77 year old woman, well-known to me with chronic heart failure, severe tricuspid regurgitation, and paroxysmal atrial fibrillation. She has an extensive cardiac history including remote ASD repair and more recent second cardiac surgery for acute aortic dissection in 2011. She underwent emergency two-vessel bypass at that point. She had been in a progressive state of decline related to atrial fibrillation and heart failure. She's had a remarkable recovery since initiation of Tikosyn a few months ago. Her diuretic regimen was changed and she is now taking Demadex. She really feels great and she is able to do all of her daily activities and has gotten back to enjoying her life again. She walks regularly for exercise. She is active in social circles with her friends and family. She still has mild dyspnea with exertion, but overall is doing much better. She's sleeping better at night. She denies chest pain or pressure, abdominal pain, abdominal swelling, or leg swelling. She's had no palpitations, lightheadedness, or syncope. She's continued to have some difficulty with her sinuses and recurrent epistaxis. She's been followed at [facility] and she is currently on a course of antibiotics.  Outpatient Encounter Prescriptions as of 07/11/2012  Medication Sig Dispense Refill  . acetaminophen (TYLENOL) 325 MG tablet Take 650 mg by mouth as needed. For pain      . amoxicillin (AMOXIL) 500 MG tablet Take 4 tablets by mouth one hour prior to dental procedure  8 tablet  0  . beta carotene w/minerals (OCUVITE) tablet Take 1 tablet by mouth 2 (two) times daily.        . cholecalciferol (VITAMIN D) 1000 UNITS tablet Take 1,000 Units by mouth daily.       Marland Kitchen CLINDAMYCIN HCL PO Take by mouth 3 (three) times daily. Three week Rx      . clorazepate (TRANXENE) 7.5 MG tablet Take 1 tablet (7.5 mg total) by mouth 3 (three) times daily as needed for anxiety.  30  tablet  3  . docusate sodium (COLACE) 100 MG capsule Take 100 mg by mouth 2 (two) times daily.      Marland Kitchen dofetilide (TIKOSYN) 250 MCG capsule Take 1 capsule (250 mcg total) by mouth every 12 (twelve) hours.  60 capsule  11  . ferrous sulfate 325 (65 FE) MG tablet Take 325 mg by mouth daily.       . fluticasone (FLONASE) 50 MCG/ACT nasal spray Place 2 sprays into the nose 2 (two) times daily.      Marland Kitchen gabapentin (NEURONTIN) 300 MG capsule TAKE 2 CAPSULES BY MOUTH AT BEDTIME  60 capsule  4  . glucosamine-chondroitin 500-400 MG tablet Take 1 tablet by mouth 2 (two) times daily.      . metoprolol (LOPRESSOR) 25 MG tablet Take 0.5 tablets (12.5 mg total) by mouth 2 (two) times daily.  33 tablet  11  . omeprazole (PRILOSEC) 20 MG capsule TAKE 1 CAPSULE BY MOUTH  TWICE A DAY 30 MINUTES BEFORE MEALS  60 capsule  6  . polyethylene glycol (MIRALAX / GLYCOLAX) packet Take 17 g by mouth daily.      . potassium chloride SA (K-DUR,KLOR-CON) 20 MEQ tablet Take 1 tablet (20 mEq total) by mouth 2 (two) times daily.  1 tablet  0  . SB SALINE NOSE NA Place 2 Squirts into the nose 2 (two) times daily.      . sertraline (ZOLOFT) 50 MG tablet Take 1 tablet (50 mg total) by mouth every  morning.  30 tablet  5  . spironolactone (ALDACTONE) 25 MG tablet Take 1 tablet (25 mg total) by mouth daily.  30 tablet  11  . torsemide (DEMADEX) 20 MG tablet Take 3 tablets (60 mg total) by mouth daily.  90 tablet  11  . traMADol (ULTRAM) 50 MG tablet Take 1 tablet (50 mg total) by mouth 3 (three) times daily as needed. For pain  90 tablet  3  . vitamin C (ASCORBIC ACID) 500 MG tablet Take 500 mg by mouth daily.       Marland Kitchen warfarin (COUMADIN) 5 MG tablet Take 2.5-5 mg by mouth as directed. Sunday and Thursdays 7.5mg  and all other days 5mg         Allergies  Allergen Reactions  . Amiodarone     Severe side effects per Pt--  . Atorvastatin     REACTION: muscle pain  . Codeine     REACTION: itching  . Erythromycin Other (See Comments)     unknown  . Ezetimibe     REACTION: INTOL to Zetia w/ cough  . Meperidine Hcl Other (See Comments)    REACTION: dizziness  . Morphine Other (See Comments)    Pt states it "makes her crazy"  . Neomycin-Bacitracin Zn-Polymyx Other (See Comments)    blisters  . Ropinirole Hydrochloride     REACTION: INTOL to Requip w/ sleep paralysis  . Simvastatin     REACTION: unable to walk--muscle pain    Past Medical History  Diagnosis Date  . OSA (obstructive sleep apnea)   . HTN (hypertension)   . Atrial fibrillation     paroxysmal; on coumadin  . CAD (coronary artery disease)     a. cath 7/11: LM 40%, mild plaque disease in CFX, LAD, and RCA;  b. 2011 CABG with S-LAD and S-OM done at time of Aortic dissection repair  . Chronic diastolic CHF (congestive heart failure)     a. 08/2011 Echo: EF 55-60%, PASP  . Dissection of aorta, thoracic     a. Type A; s/p repair 7/11 with aortic root repair and CABG x 2   . ASD (atrial septal defect)     a. s/p repair 1982 at [facility]  . Right heart failure     a. 2/2 TR and RV dysfxn;  b. echo 4/12: EF 60%, mild LVH, mild AI, mild MR, mod LAE, mod RVE with mod dec. RVSF, mod RAE, mod to severe TR, PASP 58;  c. right heart cath 4/12:  RA mean 8, RV 46/1 with mean 6, PA 45/13 with mean 26, PCWP mean 14, CO 3.68, CI 2.1 (no sig pulmon HTN)  . GERD (gastroesophageal reflux disease)   . IBS (irritable bowel syndrome)   . Esophageal stricture   . Colonic polyp   . Peripheral neuropathy   . Back pain   . Fibromyalgia   . Anxiety   . HLD (hyperlipidemia)   . Borderline diabetes   . History of TIAs   . DJD (degenerative joint disease)   . Normocytic anemia   . Thrombocytopenia   . Macular degeneration   . Depression   . Esophageal dysmotilities     ROS: Negative except as per HPI  BP 132/72  Pulse 71  Ht 5\' 7"  (1.702 m)  Wt 158 lb 12.8 oz (72.031 kg)  BMI 24.87 kg/m2  PHYSICAL EXAM: Pt is alert and oriented, NAD HEENT: normal Neck: JVP -  normal with moderately increased V waves, carotids 2+= without bruits  Lungs: CTA bilaterally CV: RRR with grade 2/6 holosystolic murmur at the left sternal border Abd: soft, NT, Positive BS, no hepatomegaly Ext: no C/C/E, distal pulses intact and equal Skin: warm/dry no rash  EKG:  Sinus rhythm 65 beats per minute, minimal voltage criteria for LVH, nonspecific ST abnormality.  ASSESSMENT AND PLAN:

## 2012-07-18 ENCOUNTER — Ambulatory Visit (INDEPENDENT_AMBULATORY_CARE_PROVIDER_SITE_OTHER): Payer: Medicare Other | Admitting: Pharmacist

## 2012-07-18 DIAGNOSIS — I4891 Unspecified atrial fibrillation: Secondary | ICD-10-CM

## 2012-07-18 DIAGNOSIS — Z8679 Personal history of other diseases of the circulatory system: Secondary | ICD-10-CM

## 2012-08-07 ENCOUNTER — Ambulatory Visit (INDEPENDENT_AMBULATORY_CARE_PROVIDER_SITE_OTHER): Payer: Medicare Other | Admitting: Pharmacist

## 2012-08-07 DIAGNOSIS — Z8679 Personal history of other diseases of the circulatory system: Secondary | ICD-10-CM

## 2012-08-07 DIAGNOSIS — I4891 Unspecified atrial fibrillation: Secondary | ICD-10-CM

## 2012-08-11 ENCOUNTER — Telehealth: Payer: Self-pay | Admitting: Pulmonary Disease

## 2012-08-11 DIAGNOSIS — M549 Dorsalgia, unspecified: Secondary | ICD-10-CM

## 2012-08-11 MED ORDER — HYDROCODONE-ACETAMINOPHEN 5-325 MG PO TABS: 1.0000 | ORAL_TABLET | Freq: Three times a day (TID) | ORAL | Status: DC | PRN

## 2012-08-11 NOTE — Telephone Encounter (Signed)
Called and spoke with pt and she is aware of SN recs to follow up with ortho for the eval of her back pain.  Order has been placed for this and pt is aware.  rx for the norco has been approved by SN and called to the pharmacy and the pt is aware.  Nothing further is needed.

## 2012-08-11 NOTE — Telephone Encounter (Signed)
Called and spoke with pt and she stated that this has been going on for about 1 week or so and she is having a lot of pain in her back and this feels like a catch in her back.  Moves at times.  She is wearing a back brace during the day and this is not helping.  Painful with movement.  Pt is requesting to have a cxr done.  Pt stated that she can only take the tramadol for the pain and she has been using the ben gay and heat but this has not helped much either.  Please advise. Thanks  Allergies  Allergen Reactions  . Amiodarone     Severe side effects per Pt--  . Atorvastatin     REACTION: muscle pain  . Codeine     REACTION: itching  . Erythromycin Other (See Comments)    unknown  . Ezetimibe     REACTION: INTOL to Zetia w/ cough  . Meperidine Hcl Other (See Comments)    REACTION: dizziness  . Morphine Other (See Comments)    Pt states it "makes her crazy"  . Neomycin-Bacitracin Zn-Polymyx Other (See Comments)    blisters  . Ropinirole Hydrochloride     REACTION: INTOL to Requip w/ sleep paralysis  . Simvastatin     REACTION: unable to walk--muscle pain

## 2012-08-13 ENCOUNTER — Other Ambulatory Visit: Payer: Self-pay | Admitting: Pulmonary Disease

## 2012-09-05 ENCOUNTER — Ambulatory Visit (INDEPENDENT_AMBULATORY_CARE_PROVIDER_SITE_OTHER): Payer: Medicare Other

## 2012-09-05 DIAGNOSIS — Z8679 Personal history of other diseases of the circulatory system: Secondary | ICD-10-CM

## 2012-09-05 DIAGNOSIS — I4891 Unspecified atrial fibrillation: Secondary | ICD-10-CM

## 2012-09-05 LAB — POCT INR: INR: 2.7

## 2012-09-18 ENCOUNTER — Telehealth: Payer: Self-pay | Admitting: Cardiovascular Disease

## 2012-09-18 NOTE — Telephone Encounter (Signed)
I spoke with the pt and she had a spell of Atrial Fibrillation this afternoon.  The episode lasted about 30 minutes, BP 122/63 and pulse 69.  The pt did go ahead and walk this afternoon.  The pt walked 8 laps and her BP was148/84, 73, O2 sat 95%.  The pt walked another 6 laps and BP was 141/86, 76.  I made the pt aware that she can still go in and out of AFib while on Tikosyn. This is the first time that the pt feels like she has been in AFib since taking medication.  I made the pt aware that she needs to contact the office if she has increased episodes of AFib.  Pt agreed with plan. The pt is scheduled to see Dr Excell Seltzer at the end of the month.

## 2012-09-18 NOTE — Telephone Encounter (Signed)
Follow-up:    Patient returned your call.  Please call back. 

## 2012-09-18 NOTE — Telephone Encounter (Signed)
Left message to call back  

## 2012-09-18 NOTE — Telephone Encounter (Signed)
Plz return call to pt [phone] regarding A-Fib issues

## 2012-09-21 ENCOUNTER — Other Ambulatory Visit: Payer: Self-pay | Admitting: Pulmonary Disease

## 2012-10-03 ENCOUNTER — Ambulatory Visit (INDEPENDENT_AMBULATORY_CARE_PROVIDER_SITE_OTHER): Payer: Medicare Other | Admitting: *Deleted

## 2012-10-03 DIAGNOSIS — Z8679 Personal history of other diseases of the circulatory system: Secondary | ICD-10-CM

## 2012-10-03 DIAGNOSIS — I4891 Unspecified atrial fibrillation: Secondary | ICD-10-CM

## 2012-10-06 ENCOUNTER — Encounter: Payer: Self-pay | Admitting: Pulmonary Disease

## 2012-10-09 ENCOUNTER — Ambulatory Visit (INDEPENDENT_AMBULATORY_CARE_PROVIDER_SITE_OTHER): Payer: Medicare Other | Admitting: Pulmonary Disease

## 2012-10-09 ENCOUNTER — Encounter: Payer: Self-pay | Admitting: *Deleted

## 2012-10-09 ENCOUNTER — Encounter: Payer: Self-pay | Admitting: Pulmonary Disease

## 2012-10-09 VITALS — BP 102/60 | HR 60 | Temp 97.4°F | Ht 67.0 in | Wt 158.0 lb

## 2012-10-09 DIAGNOSIS — G609 Hereditary and idiopathic neuropathy, unspecified: Secondary | ICD-10-CM

## 2012-10-09 DIAGNOSIS — J019 Acute sinusitis, unspecified: Secondary | ICD-10-CM

## 2012-10-09 DIAGNOSIS — I10 Essential (primary) hypertension: Secondary | ICD-10-CM

## 2012-10-09 DIAGNOSIS — I4891 Unspecified atrial fibrillation: Secondary | ICD-10-CM

## 2012-10-09 DIAGNOSIS — E7800 Pure hypercholesterolemia, unspecified: Secondary | ICD-10-CM

## 2012-10-09 DIAGNOSIS — D62 Acute posthemorrhagic anemia: Secondary | ICD-10-CM

## 2012-10-09 DIAGNOSIS — M1990 Unspecified osteoarthritis, unspecified site: Secondary | ICD-10-CM

## 2012-10-09 DIAGNOSIS — IMO0001 Reserved for inherently not codable concepts without codable children: Secondary | ICD-10-CM

## 2012-10-09 DIAGNOSIS — M545 Low back pain: Secondary | ICD-10-CM

## 2012-10-09 DIAGNOSIS — I2510 Atherosclerotic heart disease of native coronary artery without angina pectoris: Secondary | ICD-10-CM

## 2012-10-09 DIAGNOSIS — R040 Epistaxis: Secondary | ICD-10-CM | POA: Insufficient documentation

## 2012-10-09 DIAGNOSIS — I5033 Acute on chronic diastolic (congestive) heart failure: Secondary | ICD-10-CM

## 2012-10-09 DIAGNOSIS — R7309 Other abnormal glucose: Secondary | ICD-10-CM

## 2012-10-09 DIAGNOSIS — F341 Dysthymic disorder: Secondary | ICD-10-CM

## 2012-10-09 MED ORDER — FLUTICASONE PROPIONATE 50 MCG/ACT NA SUSP: NASAL | Status: DC

## 2012-10-09 MED ORDER — AMOXICILLIN-POT CLAVULANATE 875-125 MG PO TABS: 1.0000 | ORAL_TABLET | Freq: Two times a day (BID) | ORAL | Status: DC

## 2012-10-09 NOTE — Patient Instructions (Addendum)
Today we updated your med list in our EPIC system...    Continue your current medications the same...  We refilled your Fluticasone nasal spray & you should use 2 sprays in each nostril at bedtime...    During the day you should spray the SALINE nasal mist every 1-2H and use the saline rinsed 2-3 times daily to really clean out the nasal passages...   We wrote for antibiotic to be taken twice daily for 10d and gave you a refill...    Remember that when you take any antibiotic, that you should also take ALIGN probiotic & consider using the Activia yogurt as well to help your stomach...  Please follow up w/ your ENT specialists at [facility] at your earliest convenience.Marland KitchenMarland Kitchen

## 2012-10-10 ENCOUNTER — Telehealth: Payer: Self-pay | Admitting: Pulmonary Disease

## 2012-10-10 MED ORDER — AMOXICILLIN-POT CLAVULANATE 875-125 MG PO TABS: 1.0000 | ORAL_TABLET | Freq: Two times a day (BID) | ORAL | Status: DC

## 2012-10-10 MED ORDER — FLUTICASONE PROPIONATE 50 MCG/ACT NA SUSP: NASAL | Status: DC

## 2012-10-10 NOTE — Telephone Encounter (Signed)
Called and spoke with patient, patient states Rx for Fluticasone (flonase) and Augmentin were not at her pharmacy.  I also called [facility] on [address] (pref pharmacy) and these Rx were not there.  Looking through chart looks like medications were ordered but ordered as PRINT.  I have sent medications to the pharmacy and patient is aware. Nothing further needed at this time.

## 2012-10-11 ENCOUNTER — Ambulatory Visit (INDEPENDENT_AMBULATORY_CARE_PROVIDER_SITE_OTHER): Payer: Medicare Other | Admitting: Cardiovascular Disease

## 2012-10-11 ENCOUNTER — Encounter: Payer: Self-pay | Admitting: Cardiovascular Disease

## 2012-10-11 VITALS — BP 116/66 | Ht 67.0 in | Wt 157.0 lb

## 2012-10-11 DIAGNOSIS — I4891 Unspecified atrial fibrillation: Secondary | ICD-10-CM

## 2012-10-11 DIAGNOSIS — I079 Rheumatic tricuspid valve disease, unspecified: Secondary | ICD-10-CM

## 2012-10-11 DIAGNOSIS — I5032 Chronic diastolic (congestive) heart failure: Secondary | ICD-10-CM

## 2012-10-11 DIAGNOSIS — I071 Rheumatic tricuspid insufficiency: Secondary | ICD-10-CM

## 2012-10-11 LAB — BASIC METABOLIC PANEL
BUN: 27 mg/dL — ABNORMAL HIGH (ref 6–23)
Chloride: 98 mEq/L (ref 96–112)
Potassium: 4.5 mEq/L (ref 3.5–5.1)

## 2012-10-11 NOTE — Patient Instructions (Addendum)
Your physician recommends that you have lab work today: BMP and Magnesium  Your physician recommends that you schedule a follow-up appointment in: 3 MONTHS  Your physician recommends that you continue on your current medications as directed. Please refer to the Current Medication list given to you today.

## 2012-10-11 NOTE — Progress Notes (Signed)
HPI:  77 year old woman presenting for followup evaluation. She has a complex cardiac history with remote ASD repair, Type A aortic dissection with emergency surgical repair in 2011, paroxysmal atrial fibrillation, and severe tricuspid regurgitation with chronic heart failure. She has done very well since her diuretic was adjusted and she now is on a combination of torsemide and Aldactone. She is maintaining sinus rhythm on Tikosyn.  Overall she feels well. She has generalized fatigue but much better than in the past. She is walking 1 mile several times per week and his takes her about 25 minutes. She's had no lightheadedness or syncope. She's had no leg edema. She now sleeps on 2 pillows (previously on 5). She denies chest pain or pressure. She had one brief episode of palpitations but this was self-limited. She's had no further problems with epistaxis. She has chronic foot pain related to neuropathy.  Outpatient Encounter Prescriptions as of 10/11/2012  Medication Sig Dispense Refill  . acetaminophen (TYLENOL) 325 MG tablet Take 650 mg by mouth as needed. For pain      . amoxicillin (AMOXIL) 500 MG tablet Take 4 tablets by mouth one hour prior to dental procedure  8 tablet  0  . amoxicillin-clavulanate (AUGMENTIN) 875-125 MG per tablet Take 1 tablet by mouth 2 (two) times daily.  20 tablet  1  . beta carotene w/minerals (OCUVITE) tablet Take 1 tablet by mouth 2 (two) times daily.        . cholecalciferol (VITAMIN D) 1000 UNITS tablet Take 1,000 Units by mouth daily.       . clorazepate (TRANXENE) 7.5 MG tablet Take 7.5 mg by mouth at bedtime.      . docusate sodium (COLACE) 100 MG capsule Take 100 mg by mouth 2 (two) times daily.      Marland Kitchen dofetilide (TIKOSYN) 250 MCG capsule Take 1 capsule (250 mcg total) by mouth 2 (two) times daily.      . fluticasone (FLONASE) 50 MCG/ACT nasal spray 2 sprays in each nostril daily at bedtime  16 g  11  . gabapentin (NEURONTIN) 300 MG capsule TAKE 2 CAPSULES BY  MOUTH AT BEDTIME  60 capsule  4  . glucosamine-chondroitin 500-400 MG tablet Take 1 tablet by mouth 2 (two) times daily.      Marland Kitchen HYDROcodone-acetaminophen (NORCO/VICODIN) 5-325 MG per tablet Take 1 tablet by mouth 3 (three) times daily as needed for pain.  50 tablet  0  . metoprolol (LOPRESSOR) 25 MG tablet Take 0.5 tablets (12.5 mg total) by mouth 2 (two) times daily.  33 tablet  11  . omeprazole (PRILOSEC) 20 MG capsule TAKE 1 CAPSULE BY MOUTH  TWICE A DAY 30 MINUTES BEFORE MEALS  60 capsule  6  . polyethylene glycol (MIRALAX / GLYCOLAX) packet 17 g. As Needed      . potassium chloride SA (K-DUR,KLOR-CON) 20 MEQ tablet Take 20 mEq by mouth daily.   1 tablet  0  . SB SALINE NOSE NA Place 2 Squirts into the nose 2 (two) times daily.      . sertraline (ZOLOFT) 50 MG tablet TAKE 1 TABLET BY MOUTH EVERY MORNING  30 tablet  6  . spironolactone (ALDACTONE) 25 MG tablet Take 1 tablet (25 mg total) by mouth daily.  30 tablet  11  . torsemide (DEMADEX) 20 MG tablet Take 3 tablets (60 mg total) by mouth daily.  90 tablet  11  . traMADol (ULTRAM) 50 MG tablet Take 1 tablet (50 mg total) by mouth  3 (three) times daily as needed. For pain  90 tablet  3  . vitamin C (ASCORBIC ACID) 500 MG tablet Take 500 mg by mouth daily.       Marland Kitchen warfarin (COUMADIN) 5 MG tablet TAKE 1 TABLET BY MOUTH AS DIRECTED.  30 tablet  5    Allergies  Allergen Reactions  . Amiodarone     Severe side effects per Pt--  . Atorvastatin     REACTION: muscle pain  . Codeine     REACTION: itching  . Erythromycin Other (See Comments)    unknown  . Ezetimibe     REACTION: INTOL to Zetia w/ cough  . Meperidine Hcl Other (See Comments)    REACTION: dizziness  . Morphine Other (See Comments)    Pt states it "makes her crazy"  . Neomycin-Bacitracin Zn-Polymyx Other (See Comments)    blisters  . Ropinirole Hydrochloride     REACTION: INTOL to Requip w/ sleep paralysis  . Simvastatin     REACTION: unable to walk--muscle pain     Past Medical History  Diagnosis Date  . OSA (obstructive sleep apnea)   . HTN (hypertension)   . Atrial fibrillation     paroxysmal; on coumadin  . CAD (coronary artery disease)     a. cath 7/11: LM 40%, mild plaque disease in CFX, LAD, and RCA;  b. 2011 CABG with S-LAD and S-OM done at time of Aortic dissection repair  . Chronic diastolic CHF (congestive heart failure)     a. 08/2011 Echo: EF 55-60%, PASP  . Dissection of aorta, thoracic     a. Type A; s/p repair 7/11 with aortic root repair and CABG x 2   . ASD (atrial septal defect)     a. s/p repair 1982 at [facility]  . Right heart failure     a. 2/2 TR and RV dysfxn;  b. echo 4/12: EF 60%, mild LVH, mild AI, mild MR, mod LAE, mod RVE with mod dec. RVSF, mod RAE, mod to severe TR, PASP 58;  c. right heart cath 4/12:  RA mean 8, RV 46/1 with mean 6, PA 45/13 with mean 26, PCWP mean 14, CO 3.68, CI 2.1 (no sig pulmon HTN)  . GERD (gastroesophageal reflux disease)   . IBS (irritable bowel syndrome)   . Esophageal stricture   . Colonic polyp   . Peripheral neuropathy   . Back pain   . Fibromyalgia   . Anxiety   . HLD (hyperlipidemia)   . Borderline diabetes   . History of TIAs   . DJD (degenerative joint disease)   . Normocytic anemia   . Thrombocytopenia   . Macular degeneration   . Depression   . Esophageal dysmotilities     ROS: Negative except as per HPI  BP 116/66  Ht 5\' 7"  (1.702 m)  Wt 71.215 kg (157 lb)  BMI 24.59 kg/m2  SpO2 97%  PHYSICAL EXAM: Pt is alert and oriented, NAD HEENT: normal Neck: JVP - large V waves with positive HJR but much improved from previous, carotids 2+= without bruits Lungs: CTA bilaterally CV: RRR with grade 2/6 holosystolic murmur at the left sternal border Abd: soft, NT, Positive BS, no hepatomegaly Ext: no C/C/E, distal pulses intact and equal Skin: warm/dry no rash  EKG:  Normal sinus rhythm 58 beats per minute, QTC 463 ms.  ASSESSMENT AND PLAN: 1. Paroxysmal atrial  fibrillation. The patient is maintaining sinus rhythm and she will continue her current medical  program. Her QT interval is normal on Tikosyn. She is anticoagulated with warfarin. Will check a metabolic panel and magnesium level today considering her antiarrhythmic therapy.  2. Severe tricuspid regurgitation. Well managed on her current diuretic drugs.  3. History of aortic dissection. She has routine surveillance with Dr. Cornelius Moras.  4. Hypertension. Well managed with low-dose metoprolol and spironolactone.  For followup, I will see her back in 3 months. Labs will be drawn today.  Tonny Bollman 10/11/2012 12:22 PM

## 2012-10-18 ENCOUNTER — Other Ambulatory Visit: Payer: Self-pay | Admitting: Pulmonary Disease

## 2012-11-02 ENCOUNTER — Ambulatory Visit (INDEPENDENT_AMBULATORY_CARE_PROVIDER_SITE_OTHER): Payer: Medicare Other | Admitting: *Deleted

## 2012-11-02 DIAGNOSIS — I4891 Unspecified atrial fibrillation: Secondary | ICD-10-CM

## 2012-11-02 DIAGNOSIS — Z8679 Personal history of other diseases of the circulatory system: Secondary | ICD-10-CM

## 2012-11-02 LAB — POCT INR: INR: 2.2

## 2012-11-10 ENCOUNTER — Encounter: Payer: Self-pay | Admitting: Pulmonary Disease

## 2012-11-10 NOTE — Progress Notes (Signed)
Subjective:    Patient ID: [name], female    DOB: 1935-08-29, 77 y.o.   MRN: [id number]  HPI 77 y/o WF here for a follow up visit... she has multiple medical problems as noted below...     Followed by DrCooper for Cards- hx ASD repair 1982 & dilated AoRoot;  HBP & PAF;  then Aortic dissection w/ emergency surg 7/11 by DrOwen w/ CABG x2 and miraculous recovery...   Followed by DrDBrodie for GI- hx dysphagia & esoph strictures dilated, also hx candida esoph... also prob w/ constip part related to rectocele...   Followed by [facility] & ENT at [facility] for severe epistaxis requiring mult surg in 2013 (see below)...  ~  September 04, 2010:  She developed sudden left sided back & chest pain 06/04/10 w/ signs of ischemia & taken to cath lab w/ AoDissection found along w/ Lmain disease- 11hr emergency operation by DrOwen w/ repair of aortic dissection & CABG x2 (SVG to LAD, & SVG to obtuse marg branch of Circ) was required to get her off the bypass machine... she made a miraculous recovery- followed closely by DrOwen & DrCooper w/ meds as below... getting stonger w/ home health rehab, etc... some recent incr trouble w/ fluid retention & they have adjusted diuretics... XRays & labs reviewed... I have offered assist in any way needed- she requests refill Rxs to [facility].  ~  September 07, 2011:  1 year ROV & she has had a lot going on >> long prob list & multisystem disease w/ mult specialists involved in her care...    Abn CXR> s/p AscAo dissection repair & bilat apical schwannomas (see CXR & CTA report); O2sat= 91% on RA & advised incr exercise at home...    HBP> on Metoprolol50Bid, Lasix80Bid, Zaroxyln2.5 2d/wk, K20Tid- ;  BP= 114/68 & labs showed K= 3.2; advised incr K20 to 2Bid everyday...    CARDIAC> followed by DrCooper & DrOwens w/ long complic cardiac hx> ASD secundum repair at [facility], CAD, PAF, Ac on Chr Diastolic CHF, & 7/11 Asc ThorAo Dissection w/ severe AI & Lmain lesion==> s/p  repair of dissecting aneurysm & CABGx2 [id number]... She knows to take Amox before dental work.    PAF> on ASA81 + Coumadin in CC, off Amio, followed by DrCooper for Cards    CHOL> on Crestor5 & her FLP looks good (see below)...    DM> borderline DM on diet alone & labs look good; continue diet + exercise...    Hx esophagitis & stricture> followed by DrDBrodie on Prilosec20Bid; last EGD 3/11ndida (Rx'd); denies heartburn, pain, dysphagia, etc...    IBS w/ constip & Rectocele> also followed by DrGottsegen; on stool softeners but she has trouble w/ evacuation & they are aware...    Left flank discomfort> she also sees Urology (prev DrKimbrough) & has f/u appt pending...    DJD/ LBP/ FM> she is actually c/o "corns" on her feet that make walking difficult she says; she will try OTC pads & if not improved she will call Podiatry...    Neuropathy> followed by Autumn Patty on Neurontin300Bid & Tramadol Prn...    Anxiety/ Depression> on Zoloft50, Tranxene7.5 Prn (ave~1/d she says), & [id number];   ~  March 07, 2012:  49mo ROV & [name] has been thru a lot since last OV>> Hx Aortic replacement & AFib prev on Coumadin> Feb2013 developed severe epistaxis:    Treated for bilat severe epistaxis 12/27/11 w/ bilat endoscopic sphenopalatine ligation & packing by DrGore at [facility];  packing removed 2/18 by DrShoemaker in his office...    Anemic due to acute blood loss> prev Hg= 10-11 range & was 7.7 when Tx to [facility]...    Re-adm 2/28 - 01/14/12 for recurrent bleeding from right nares- packed & attempted embolization was unsuccessful; transferred to [facility] by [facility] for further surg...    She had further surg by ENT at [facility] but we don't have any of these records or f/u note by ENT in [place]... She has had mult f/u visits w/ DrCooper for Cards> back on Coumadin for her AFib (intol to Amio & Coumadin carefully monitored by CC), right heart failure w/ severe TR & no surg option, extremely fatigued/ exhaused/ doing very poorly  overall & she feels she has not recovered from the ENT surg/ nose bleeding episodes... CXR 4/13 showed chr severe cardiomeg, prev CABG, chr pleural blunting but no acute effusions, post-op right axillary changes, NAD... LABS 4/13:  Chems- ok w/ K=4.0 BUN=42 Creat=1.1 BS=122 A1c=7.1;  CBC- Hg=9.1 Fe=18 (4%);  TSH=4.49;  B12=849;  PROTIME= WAY TOO THIN & referred to CC- stat...  ~  Apr 05, 2012:  73mo ROV> [name] remains very weak & is having difficulty at home- fell in [place] yest when husb was out, couldn't stand, crawled to cellphone & husb ret to help her up, bruised right lat ribs & left hip area (XRays today are neg);  She notes her walking is worse since her last surg & we discussed the need for PHYSICAL THERAPY- walking, strengthening, balance;  She has Neuro f/u w/ DrLove pending;  "My goal is to get in my car and GO and DO!" she says...    She saw DrCooper for Cards f/u earlier this month & there doesn't look like there is much else he can do;  F/u CTA 5/13 showed stable prox Ao graft- see extensive report by DrYamagata (reviewed)...    She saw DrFontaine for GYN f/u- c/o labial swelling & tenderness- infected seb cyst, drained, Rx Doxy Bid, & improved... CXR 5/13 showed stable cardiomegaly & median sternotomy, atherosclerotic calcif in AO, clear lungs, osteopenia, NAD... Right RIB FILMS are neg for any fractures... Left HIP FILM is neg for fx, pos for osteopenia... LABS 5/13:  Chems- ok x HCO3=37;  CBC- improved w/ Hg=11.1 Fe=132 (38%)  ~  June 12, 2012:  58mo ROV & post hosp check> [name] is now [facility] having been placed on Tikosyn 6/13 Hosp for AFib/ CHF/ weakness; she converted to NSR, Lasix changed to Demadex/ Aldactone & Cards is watching very carefully;  She has been walking >95mi daily again;  She is also back on Coumadin via CC w/ only minor epistaxis noted...  She saw DrCooper 6/13 post-hosp check> doing well onTikosyn 250mg  Bid, Metoprolol 12.5mg  bid, Demadex 20mg -3tabs daily,  Aldactone25mg /d, K20/d, and Coumadin via clinic...    We reviewed prob list, meds, xrays and labs> see below for updates >> CXR 6/13 showed s/p median sternotomy, cardiomeg, tort Ao, left base scarring/ atx, NAD... LABS 6/13 Hosp reviewed...  ~  October 09, 2012:  64mo ROV & [name] is stable but still notes some upper airway congestion, sore throat, hoarseness, & some blood from right nares; we decided to treat w/ Augmentin, Flonase, Mucinex, Fluids, Nasal saline, etc...           Problem List:  SEVERE EPISTAXIS >> see above & managed by DrBates et al + ENT at [facility]=> Dr Eliezer Lofts treated w/ max med Rx w/ Ab for 3wks, nasal  saline spray Tid, etc; f/u CT sinuses was improved, perforation in nasal septum noted & they offered surg repair but she is holding off...  OBSTRUCTIVE SLEEP APNEA (ICD-327.23) - sleep study 9/06 by Breck Coons showed RDI=25/hr during REM w/ desat to 77%...  ABN CXR w/ left superior mediastinal opacity ?etiology- no change back to 2006 films= benign... ~  CXR 2/11 showed cardiomeg, ectatic Ao, left superior mediast soft tissue prom w/o change (?ectasia of great vessels or benign mass). ~  CXR 9/11 s/p median sternotomy, dissection repair,  & CABG- cardiomeg, bilat effusions & basilar atx, no ch in left superior lesion... ~  CXR 4/12 showed med sternotomy, cardiomeg, hyerinflation/ scarring, some vasc congestion... ~  CTAngio 5/12 showed stable asc ao dissection repair, rounded soft tissues masses both apicies L>R are prob neural tumors (schwannomas)... ~  CXR 4/13 & 5/13 showed chr severe cardiomeg, prev CABG, chr pleural blunting but no acute effusions, post-op right axillary changes, NAD.Marland Kitchen. ~  CTAngio 5/13 showed stable appearance in the chest w/ mult findings (SEE REPORT); stable appearance in the abd as well (SEE REPORT)...  HYPERTENSION (ICD-401.9) - controlled on METOPROLOL 50mg Bid, + LASIX/ ZAROXYLN/ KCl...  ~  10/12:  BP=110/70 and she denies HA, visual changes, CP,  palipit, syncope... she is fatigued, SOB/ DOE, & +edema... getting PT/ rehab therapy & improving slowly... ~  4/13:  BP= 100/70 & she is very weak s/p epistaxis episodes 2/13 & 3/13 w/ surg here & at [facility]... ~  5/13:  BP= 100/70 & she is very weak... ~  7/13:  BP= 120/74 & she is feeling much better in NSR on Tikosyn... ~  11/13: BP= 102/60 & she denies CP, palpit, ch in SOB, edema, etc...  ATRIAL FIBRILLATION (ICD-427.31) - on COUMADIN followed in the CC and very carefully monitored due to her severe epistaxis; managed by DrCooper for Cards & she is intol to Amiodarone...  CORONARY ARTERY DISEASE (ICD-414.00) - off ASA due to severe epistaxis episodes.. ACUTE ON CHRONIC DIASTOLIC HEART FAILURE (ICD-428.33) - prev on Lasix, Zaroxylyn, & KCl; Hosp 6/13 for Tikosyn Rx & much improved in NSR on this & Demadex/ Aldactone/ KCl/ Coumadin... 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 Hx of ATRIAL SEPTAL DEFECT (ICD-745.5) - s/p ASD secundum repaired at [facility] in 1982... long-time pt of DrJoeLeBauer and now DrCooper> ~  Cardiolite 5/06 showed no ischemia & EF=65%...  ~  2DEcho 12/07 showed norm LVF, EF=55%, no regional wall motion abn, & RA/RV= mod dil... ~  Cardiac MRI 3/09 showed mod Ao root enlargement 4.3cm, norm AoV/ Arch/ Desc Ao, mod RV & biatrial enlargement from the prev ASD repair (no resid leak), mod PA enlargement, norm LV w/ EF=59%... ~  [facility] 9/09 w/ MI ruled-out, atyp CP (prob esoph spasm) & Myoview was neg- no scar or ischemia, EF= 77%... ~  2DEcho 9/10 showed sl incr LV wall thikness c/w mild LVH, norm wall motion, EF= 55-60%, gr2 DD, mild AI, mod dil LA/RA, mod-severe TR, mild pulmHTN... ~  Emergency hosp 7/11 w/ Aortic dissection, severe AI, severe TR & cath w/ 40%Lmain lesion- s/p repair & required CABG x2 as well... ~  DrOwen does CTAngiograms every 44mo... ~  [facility] 4/12 by Cards for CHF from severe TR & right heart disease> SEE 2DEcho & Right heart  cath data> reviewed... ~  She saw DrCooper 10/12> felt to be reasonably stable, holding NSR, on Coumadin via CC, & f/u 2DEcho ordered & pending... ~  Labs 10/12 showed K=3.2 on K20Tid+4MTh; TCO2=37, BUN=28, Creat=0.8; pt instructed to incr K20 to [facility] everyday... ~  Serial labs avail in the results section... Labs 5/13 showed BUN=21, Creat=0.8, K=3.9 ~  HOSP 6/13 by Cards & much improved in NSR on TIKOSYN 250mg Bid, DEMADEX20mg - 3/d, ALDACTONE25mg /d, KCl 20/d; and COUMADIN via CC...  VENOUS INSUFFICIENCY (ICD-459.81) & EDEMA (ICD-782.3) - she has chr VI changes, superficial VV, and incr edema since surg 7/11... ~  3/09 ABI's done and were WNL...  HYPERCHOLESTEROLEMIA (ICD-272.0) - on CRESTOR 5mg Qod & she wants to STOP for awhile; she has blamed Zetia for cough in the past... ~  FLP 7/08 showing TChol 143, TG 173, HDL 32, LDL 76. ~  FLP 4/09 showed TChol 172, Tg 161, HDL 38, LDL 102 ~  FLP 7/09 showed TChol 167. Tg 148, HDL 40, LDL 98... rec- continue same. ~  FLP 1/10 showed TChol 177, TG 85, HDL 43, LDL 117 ~  FLP 2/11 showed TChol 160, TG 83, HDL 50, LDL 94 ~  FLP 10/12 on Cres5 showed TChol 150, TG 81, HDL 52, LDL 82 ~  4/13:  She wants to STOP the Cres5Qod for awhile since she is so weak...  DIABETES MELLITUS, BORDERLINE (ICD-790.29) - on diet Rx alone... ~  Hx borderline BS w/ FBS=119 in 7/08 & HgA1c=6.1 on diet alone. ~  labs 4/09 showed BS= 120, HgA1c= 6.0.Marland KitchenMarland Kitchen continue diet rx. ~  labs 7/09 showed BS= 132 ~  labs 1/10 showed BS= 120, A1c= 6.1 ~  labs 2/11 showed BS= 106 ~  Labs 10/12 showed BS= 121, A1c= 6.3.Marland KitchenMarland Kitchen Ok on diet Rx. ~  Labs 4/13 showed BS= 122, A1c= 7.1 on diet alone & we reviewed low carb diet restriction... ~  11/13:  No DM retinopathy per DrStoneburner...  Hx of ESOPHAGEAL STRICTURE (ICD-530.3) - last EGD 6/04 by DrDBrodie w/ esophagitis, hx gastritis... 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 treated w/ PRILOSEC  20mg Bid... ~  [facility] Sep09 w/ atyp CP felt to be esoph spasm... GI f/u DrDBrodie w/ EGD Sep09= candida esoph, nonobstructing esoph stricture dilated. ~  2/11: presents w/ incr dysphagia & f/u GI w/ Ba Esophagram-mild stricture;  EGD 3/11 showed candida esophagitis (Rx'd);  Sonar- no gallstones, WNL.Marland KitchenMarland Kitchen  IRRITABLE BOWEL SYNDROME, HX OF (ICD-V12.79) - colonoscopy was 6/04 by DrDBrodie= neg without polyps etc... f/u 9/09 was negative- no recurrent polyps... f/u planned 9/14... ~  she reports trouble w/ constip and hx recurrent rectocele- initial surg 1987, now sees DrGottsegen for GYN & may need additional surg... ~  She saw DrDBrodie 9/12> sm vol intermit painless rectal bleeding from anal fissures; rectocele, constip, difficulty evacuating; on stool softeners & Nupercainal oint.  UROLOGY> followed by DrKimbrough> Hx microhematuria, renal cyst, duplication & dilatation on the left, left sided pain off & on...  DEGENERATIVE JOINT DISEASE (ICD-715.90) >> on TRAMADOL 50mg  prn  BACK PAIN, LUMBAR (ICD-724.2) >> she also takes MVI, Vit D...  FIBROMYALGIA (ICD-729.1)  PERIPHERAL NEUROPATHY (ICD-356.9) - sl worse per pt and DrLove follows... she takes NEURONTIN 300mg Bid and refuses other meds... 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  ?TIA - hosp 11/07 by Autumn Patty w/ ?TIA vs sleep paralysis episode... MRIBrain w/ sm vessel dis and atherosclerotic changes... she still complains of "weak spells, my whole body gets weak in the afternoons"... these episodes last ~54min, resolve  spont w/ rest or ?better after eating... she is convinced that the sleep paralysis episodes were caused by the Requip med...  ANXIETY DEPRESSION (ICD-300.4) - she states that "my nerves are shot" w/ stress from son's alcoholism & divorce, plus her husb's illness etc... started ZOLOFT 50mg /d 7/10 & improved...  ANEMIA >> due to Anemia of chronic dis &  blood loss from epistaxis 2/13 & 3/13... ~  Labs from 2/13 showed baseline Hg= 10-11 range & bleed down to Hg=7.7 on Tx to [facility]... ~  Labs here 4/13 showed Hg= 9.1, Fe= 18 (4%sat); started on FeSO4 325mg  Bid (w/ VitC 500mg ) but she only took once daily... ~  Labs 5/13 showed Hg= 11.1, Fe= 132 (38%)... On Fe one daily...  Health Maintenance: ~  GYN= DrGottsegen w/ Mammograms at [facility] (last 4/10 w/ ultrasound f/u)... Gyn does BMD's and she reports it was normal several yrs ago, not on calcium, MVI, etc... ~  Immunizations:  she reports 2010 Flu vaccine & Pneumonia shot in 2010... cna't recall last Tetanus.   Past Medical History  Diagnosis Date  . OSA (obstructive sleep apnea)   . HTN (hypertension)   . Atrial fibrillation     paroxysmal; on coumadin  . CAD (coronary artery disease)     a. cath 7/11: LM 40%, mild plaque disease in CFX, LAD, and RCA;  b. 2011 CABG with S-LAD and S-OM done at time of Aortic dissection repair  . Chronic diastolic CHF (congestive heart failure)     a. 08/2011 Echo: EF 55-60%, PASP  . Dissection of aorta, thoracic     a. Type A; s/p repair 7/11 with aortic root repair and CABG x 2   . ASD (atrial septal defect)     a. s/p repair 1982 at [facility]  . Right heart failure     a. 2/2 TR and RV dysfxn;  b. echo 4/12: EF 60%, mild LVH, mild AI, mild MR, mod LAE, mod RVE with mod dec. RVSF, mod RAE, mod to severe TR, PASP 58;  c. right heart cath 4/12:  RA mean 8, RV 46/1 with mean 6, PA 45/13 with mean 26, PCWP mean 14, CO 3.68, CI 2.1 (no sig pulmon HTN)  . GERD (gastroesophageal reflux disease)   . IBS (irritable bowel syndrome)   . Esophageal stricture   . Colonic polyp   . Peripheral neuropathy   . Back pain   . Fibromyalgia   . Anxiety   . HLD (hyperlipidemia)   . Borderline diabetes   . History of TIAs   . DJD (degenerative joint disease)   . Normocytic anemia   . Thrombocytopenia   . Macular degeneration   . Depression   . Esophageal  dysmotilities     Past Surgical History  Procedure Date  . Asd repair 1982    [facility]  . Rotator cuff repair 2007    right  . Cardioversion     x 3  . Tubal ligation   . Appendectomy   . Vaginal hysterectomy 1987    A/P Repair With Cystocele and rectocele repair  . Tonsillectomy   . Oophorectomy 1994    BSO  . Pelvic laparoscopy 1994  . Emergency redo median sternotomy for hemiarch repair of acute type a aortic  dissection 06/05/2010  . Nasal hemorrhage control 12/27/2011    Procedure: EPISTAXIS CONTROL;  Surgeon: Melvenia Beam, MD;  Location: [facility] OR;  Service: ENT;  Laterality: N/A;    Outpatient Encounter Prescriptions as  of 10/09/2012  Medication Sig Dispense Refill  . acetaminophen (TYLENOL) 325 MG tablet Take 650 mg by mouth as needed. For pain      . amoxicillin (AMOXIL) 500 MG tablet Take 4 tablets by mouth one hour prior to dental procedure  8 tablet  0  . beta carotene w/minerals (OCUVITE) tablet Take 1 tablet by mouth 2 (two) times daily.        . cholecalciferol (VITAMIN D) 1000 UNITS tablet Take 1,000 Units by mouth daily.       Marland Kitchen docusate sodium (COLACE) 100 MG capsule Take 100 mg by mouth 2 (two) times daily.      Marland Kitchen dofetilide (TIKOSYN) 250 MCG capsule Take 1 capsule (250 mcg total) by mouth 2 (two) times daily.      Marland Kitchen gabapentin (NEURONTIN) 300 MG capsule TAKE 2 CAPSULES BY MOUTH AT BEDTIME  60 capsule  4  . glucosamine-chondroitin 500-400 MG tablet Take 1 tablet by mouth 2 (two) times daily.      Marland Kitchen HYDROcodone-acetaminophen (NORCO/VICODIN) 5-325 MG per tablet Take 1 tablet by mouth 3 (three) times daily as needed for pain.  50 tablet  0  . metoprolol (LOPRESSOR) 25 MG tablet Take 0.5 tablets (12.5 mg total) by mouth 2 (two) times daily.  33 tablet  11  . omeprazole (PRILOSEC) 20 MG capsule TAKE 1 CAPSULE BY MOUTH  TWICE A DAY 30 MINUTES BEFORE MEALS  60 capsule  6  . polyethylene glycol (MIRALAX / GLYCOLAX) packet 17 g. As Needed      . potassium chloride SA  (K-DUR,KLOR-CON) 20 MEQ tablet Take 20 mEq by mouth daily.   1 tablet  0  . SB SALINE NOSE NA Place 2 Squirts into the nose 2 (two) times daily.      . sertraline (ZOLOFT) 50 MG tablet TAKE 1 TABLET BY MOUTH EVERY MORNING  30 tablet  6  . spironolactone (ALDACTONE) 25 MG tablet Take 1 tablet (25 mg total) by mouth daily.  30 tablet  11  . torsemide (DEMADEX) 20 MG tablet Take 3 tablets (60 mg total) by mouth daily.  90 tablet  11  . traMADol (ULTRAM) 50 MG tablet Take 1 tablet (50 mg total) by mouth 3 (three) times daily as needed. For pain  90 tablet  3  . vitamin C (ASCORBIC ACID) 500 MG tablet Take 500 mg by mouth daily.       Marland Kitchen warfarin (COUMADIN) 5 MG tablet TAKE 1 TABLET BY MOUTH AS DIRECTED.  30 tablet  5  . [DISCONTINUED] clorazepate (TRANXENE) 7.5 MG tablet Take 7.5 mg by mouth at bedtime.      . [DISCONTINUED] fluticasone (FLONASE) 50 MCG/ACT nasal spray Place 2 sprays into the nose 2 (two) times daily.      . [DISCONTINUED] fluticasone (FLONASE) 50 MCG/ACT nasal spray 2 sprays in each nostril daily at bedtime  16 g  11  . [DISCONTINUED] amoxicillin-clavulanate (AUGMENTIN) 875-125 MG per tablet Take 1 tablet by mouth 2 (two) times daily.  20 tablet  1  . [DISCONTINUED] CLINDAMYCIN HCL PO Take by mouth 3 (three) times daily. Three week Rx      . [DISCONTINUED] ferrous sulfate 325 (65 FE) MG tablet Take 325 mg by mouth daily.         Allergies  Allergen Reactions  . Amiodarone     Severe side effects per Pt--  . Atorvastatin     REACTION: muscle pain  . Codeine     REACTION: itching  .  Erythromycin Other (See Comments)    unknown  . Ezetimibe     REACTION: INTOL to Zetia w/ cough  . Meperidine Hcl Other (See Comments)    REACTION: dizziness  . Morphine Other (See Comments)    Pt states it "makes her crazy"  . Neomycin-Bacitracin Zn-Polymyx Other (See Comments)    blisters  . Ropinirole Hydrochloride     REACTION: INTOL to Requip w/ sleep paralysis  . Simvastatin      REACTION: unable to walk--muscle pain    Current Medications, Allergies, Past Medical History, Past Surgical History, Family History, and Social History were reviewed in [employer] record.    Review of Systems         See HPI - all other systems neg except as noted...  The patient complains of decreased hearing, dyspnea on exertion, and peripheral edema.  The patient denies anorexia, fever, weight loss, weight gain, vision loss, hoarseness, chest pain, syncope, prolonged cough, headaches, hemoptysis, abdominal pain, melena, hematochezia, severe indigestion/heartburn, hematuria, incontinence, muscle weakness, suspicious skin lesions, transient blindness, difficulty walking, depression, unusual weight change, abnormal bleeding, enlarged lymph nodes, and angioedema.    Objective:   Physical Exam    WD, Thin, 77 y/o WF in NAD... GENERAL:  Alert & oriented; pleasant & cooperative... HEENT:  [place]/AT, EOM-wnl, PERRLA, Fundi-benign, EACs-clear, TMs-wnl, NOSE-clear, THROAT-clear & wnl... NECK:  Supple w/ fairROM; no JVD; normal carotid impulses w/o bruits; no thyromegaly or nodules palpated; no lymphadenopathy. CHEST:  Median sterotomy scar, decr BS at bases w/ few bibasilar rales... HEART:  Regular Rhythm;  gr 1/6 sys murmur, without rubs or gallops detected... s/p repair ASD & Ao dissection... ABDOMEN:  Soft & nontender; normal bowel sounds; no organomegaly or masses detected. EXT: without deformities, mild arthritic changes; no varicose veins/ +venous insuffic/ 3+ edema. NEURO:  CN's intact;  peripheral neuropathy without focal neuro deficits on exam... DERM:  No lesions noted; no rash etc...  RADIOLOGY DATA:  Reviewed in the EPIC EMR & discussed w/ the patient...  LABORATORY DATA:  Reviewed in the EPIC EMR & discussed w/ the patient...   Assessment & Plan:   Epistaxsis> she remains on max therapy from ENT at [facility]...  Abn CXR> s/p AscAo dissection repair & bilat  apical schwannomas (see CXR & CTA report); O2sat= 91% on RA & advised incr exercise at home...     HBP> on Metoprolol50Bid, Lasix80Bid, Zaroxyln2.5 2d/wk, K20 w/ 4-5-4-5 alt day regimen;  BP= 100/70 & followed closely by DrCooper...     CARDIAC> followed by DrCooper & DrOwens w/ long complic cardiac hx> ASD secundum repair at [facility], CAD, PAF, Ac on Chr Diastolic CHF, & 7/11 Asc ThorAo Dissection w/ severe AI & Lmain lesion==> s/p repair of dissecting aneurysm & CABGx2... She knows to take Amox before dental work;  known severe right heart failure w/ severe TR & no surg option> being managed by DrCooper for Cards w/ freq f/u visits but she is miserable...     AFib> on Coumadin in CC, now much improved on Tikosyn, converted to NSR, feels much better, exercising etc...     CHOL> on Crestor5Qod & her FLP looks good (see below), but she wonders about her weakness & wants to stop for awhile...     DM> Borderline DM on diet alone & labs look ok w/ BS=122, A1c=7.1; continue diet (no sweets) + no meds at present...     Hx esophagitis & stricture> followed by DrDBrodie on Prilosec20Bid; last EGD  3/11ndida (Rx'd); denies heartburn, pain, dysphagia, etc...  IBS w/ constip & Rectocele> also followed by DrGottsegen; on stool softeners but she has trouble w/ evacuation & they are aware...     Left flank discomfort> she also sees Urology (prev DrKimbrough) & has f/u appt pending...     DJD/ LBP/ FM> she is actually c/o "corns" on her feet that make walking difficult she says; she will try OTC pads & if not improved she will call Podiatry...     Neuropathy> followed by Autumn Patty on Neurontin300Bid & Tramadol Prn...     Anxiety/ Depression> on Zoloft50, Tranxene7.5 Prn (ave~1/d she says), & Ambien10Qhs...  ANEMIA>  Hg and Fe are remarkably better...   Patient's Medications  New Prescriptions   AMOXICILLIN-CLAVULANATE (AUGMENTIN) 875-125 MG PER TABLET    Take 1 tablet by mouth 2 (two) times daily.    Previous Medications   ACETAMINOPHEN (TYLENOL) 325 MG TABLET    Take 650 mg by mouth as needed. For pain   AMOXICILLIN (AMOXIL) 500 MG TABLET    Take 4 tablets by mouth one hour prior to dental procedure   BETA CAROTENE W/MINERALS (OCUVITE) TABLET    Take 1 tablet by mouth 2 (two) times daily.     CHOLECALCIFEROL (VITAMIN D) 1000 UNITS TABLET    Take 1,000 Units by mouth daily.    DOCUSATE SODIUM (COLACE) 100 MG CAPSULE    Take 100 mg by mouth 2 (two) times daily.   DOFETILIDE (TIKOSYN) 250 MCG CAPSULE    Take 1 capsule (250 mcg total) by mouth 2 (two) times daily.   GABAPENTIN (NEURONTIN) 300 MG CAPSULE    TAKE 2 CAPSULES BY MOUTH AT BEDTIME   GLUCOSAMINE-CHONDROITIN 500-400 MG TABLET    Take 1 tablet by mouth 2 (two) times daily.   HYDROCODONE-ACETAMINOPHEN (NORCO/VICODIN) 5-325 MG PER TABLET    Take 1 tablet by mouth 3 (three) times daily as needed for pain.   METOPROLOL (LOPRESSOR) 25 MG TABLET    Take 0.5 tablets (12.5 mg total) by mouth 2 (two) times daily.   OMEPRAZOLE (PRILOSEC) 20 MG CAPSULE    TAKE 1 CAPSULE BY MOUTH  TWICE A DAY 30 MINUTES BEFORE MEALS   POLYETHYLENE GLYCOL (MIRALAX / GLYCOLAX) PACKET    17 g. As Needed   POTASSIUM CHLORIDE SA (K-DUR,KLOR-CON) 20 MEQ TABLET    Take 20 mEq by mouth daily.    SB SALINE NOSE NA    Place 2 Squirts into the nose 2 (two) times daily.   SERTRALINE (ZOLOFT) 50 MG TABLET    TAKE 1 TABLET BY MOUTH EVERY MORNING   SPIRONOLACTONE (ALDACTONE) 25 MG TABLET    Take 1 tablet (25 mg total) by mouth daily.   TORSEMIDE (DEMADEX) 20 MG TABLET    Take 3 tablets (60 mg total) by mouth daily.   TRAMADOL (ULTRAM) 50 MG TABLET    Take 1 tablet (50 mg total) by mouth 3 (three) times daily as needed. For pain   VITAMIN C (ASCORBIC ACID) 500 MG TABLET    Take 500 mg by mouth daily.    WARFARIN (COUMADIN) 5 MG TABLET    TAKE 1 TABLET BY MOUTH AS DIRECTED.  Modified Medications   Modified Medication Previous Medication   CLORAZEPATE (TRANXENE) 7.5 MG TABLET  clorazepate (TRANXENE) 7.5 MG tablet      TAKE 1 TABLET THREE TIMES A DAY AS NEEDED FOR NERVES    Take 7.5 mg by mouth at bedtime.   FLUTICASONE (FLONASE) 50 MCG/ACT  NASAL SPRAY fluticasone (FLONASE) 50 MCG/ACT nasal spray      2 sprays in each nostril daily at bedtime    Place 2 sprays into the nose 2 (two) times daily.  Discontinued Medications   CLINDAMYCIN HCL PO    Take by mouth 3 (three) times daily. Three week Rx   FERROUS SULFATE 325 (65 FE) MG TABLET    Take 325 mg by mouth daily.

## 2012-11-25 ENCOUNTER — Other Ambulatory Visit: Payer: Self-pay | Admitting: Pulmonary Disease

## 2012-11-27 ENCOUNTER — Encounter: Payer: Self-pay | Admitting: Cardiovascular Disease

## 2012-11-27 NOTE — Telephone Encounter (Signed)
Pt has questions regarding medication.

## 2012-11-27 NOTE — Telephone Encounter (Signed)
This encounter was created in error - please disregard.

## 2012-11-28 ENCOUNTER — Ambulatory Visit (INDEPENDENT_AMBULATORY_CARE_PROVIDER_SITE_OTHER): Payer: Medicare Other | Admitting: *Deleted

## 2012-11-28 DIAGNOSIS — I4891 Unspecified atrial fibrillation: Secondary | ICD-10-CM

## 2012-11-28 DIAGNOSIS — Z8679 Personal history of other diseases of the circulatory system: Secondary | ICD-10-CM

## 2012-12-09 ENCOUNTER — Other Ambulatory Visit: Payer: Self-pay | Admitting: Pulmonary Disease

## 2013-01-08 ENCOUNTER — Other Ambulatory Visit: Payer: Self-pay | Admitting: Pulmonary Disease

## 2013-01-09 ENCOUNTER — Telehealth: Payer: Self-pay | Admitting: Cardiovascular Disease

## 2013-01-09 NOTE — Telephone Encounter (Signed)
New Problem:    Patient called in wanting to speak with you about missing her dose of dofetilide (TIKOSYN) 250 MCG capsule.  Please call back.

## 2013-01-09 NOTE — Telephone Encounter (Signed)
I spoke with the pt and she takes her Tikosyn at 8:30 AM and 8:30 PM.  The pt said she forgot to take her dose this morning and would like to know how to proceed.  I spoke with Burgess Amor Pharm-D and she recommended that the pt take her next dose of Tikosyn at 6 PM tonight and then at 6:30 AM tomorrow morning, then 7 PM tomorrow night and 7:30 AM on Thursday morning.  The pt should keep adding an additional 30 minutes until pt gets back to normal time for medication. Pt verbalized understanding of instructions.

## 2013-01-11 ENCOUNTER — Other Ambulatory Visit: Payer: Self-pay | Admitting: Pulmonary Disease

## 2013-01-12 ENCOUNTER — Ambulatory Visit (INDEPENDENT_AMBULATORY_CARE_PROVIDER_SITE_OTHER): Payer: Medicare Other | Admitting: *Deleted

## 2013-01-12 ENCOUNTER — Encounter: Payer: Self-pay | Admitting: Cardiovascular Disease

## 2013-01-12 ENCOUNTER — Ambulatory Visit (INDEPENDENT_AMBULATORY_CARE_PROVIDER_SITE_OTHER): Payer: Medicare Other | Admitting: Cardiovascular Disease

## 2013-01-12 VITALS — BP 114/66 | HR 57 | Ht 67.0 in | Wt 160.0 lb

## 2013-01-12 DIAGNOSIS — I079 Rheumatic tricuspid valve disease, unspecified: Secondary | ICD-10-CM

## 2013-01-12 DIAGNOSIS — I4891 Unspecified atrial fibrillation: Secondary | ICD-10-CM

## 2013-01-12 NOTE — Progress Notes (Signed)
HPI:  [name] returns for followup evaluation. She is a delightful 78 year old woman with a history of ASD status post remote surgical correction,Type A aortic dissection status post emergency surgical repair including CABG, atrial fibrillation, severe tricuspid regurgitation, and congestive heart failure. She continues to do remarkably well after maintaining sinus rhythm on antiarrhythmic therapy.  She's walking 2 miles a least 3 days weekly. She has mild dyspnea with activity and mild fatigue, but overall feels great. She remains very active and is doing a lot with her church. She denies chest pain, edema, orthopnea, PND, or palpitations.  Outpatient Encounter Prescriptions as of 01/12/2013  Medication Sig Dispense Refill  . acetaminophen (TYLENOL) 325 MG tablet Take 650 mg by mouth as needed. For pain      . amoxicillin (AMOXIL) 500 MG tablet Take 4 tablets by mouth one hour prior to dental procedure  8 tablet  0  . beta carotene w/minerals (OCUVITE) tablet Take 1 tablet by mouth 2 (two) times daily.        . Calcium Carbonate-Vit D-Min (CALCIUM 1200 PO) Take 1,000 mg by mouth daily.      . cholecalciferol (VITAMIN D) 1000 UNITS tablet Take 1,000 Units by mouth daily.       . clorazepate (TRANXENE) 7.5 MG tablet TAKE 1 TABLET THREE TIMES A DAY AS NEEDED FOR NERVES  90 tablet  0  . docusate sodium (COLACE) 100 MG capsule Take 100 mg by mouth 2 (two) times daily.      Marland Kitchen dofetilide (TIKOSYN) 250 MCG capsule Take 1 capsule (250 mcg total) by mouth 2 (two) times daily.      Marland Kitchen gabapentin (NEURONTIN) 300 MG capsule TAKE 2 CAPSULES BY MOUTH AT BEDTIME  60 capsule  4  . glucosamine-chondroitin 500-400 MG tablet Take 1 tablet by mouth 2 (two) times daily.      Marland Kitchen HYDROcodone-acetaminophen (NORCO/VICODIN) 5-325 MG per tablet Take 1 tablet by mouth 3 (three) times daily as needed for pain.  50 tablet  0  . metoprolol (LOPRESSOR) 25 MG tablet Take 0.5 tablets (12.5 mg total) by mouth 2 (two) times daily.   33 tablet  11  . omeprazole (PRILOSEC) 20 MG capsule TAKE 1 CAPSULE BY MOUTH  TWICE A DAY 30 MINUTES BEFORE MEALS  60 capsule  6  . polyethylene glycol (MIRALAX / GLYCOLAX) packet 17 g. As Needed      . potassium chloride SA (K-DUR,KLOR-CON) 20 MEQ tablet Take 20 mEq by mouth daily.   1 tablet  0  . SB SALINE NOSE NA Place 2 Squirts into the nose 2 (two) times daily.      . sertraline (ZOLOFT) 50 MG tablet TAKE 1 TABLET BY MOUTH EVERY MORNING  30 tablet  6  . spironolactone (ALDACTONE) 25 MG tablet Take 1 tablet (25 mg total) by mouth daily.  30 tablet  11  . torsemide (DEMADEX) 20 MG tablet Take 3 tablets (60 mg total) by mouth daily.  90 tablet  11  . traMADol (ULTRAM) 50 MG tablet TAKE 1 TABLET (50 MG TOTAL) BY MOUTH 3 (THREE) TIMES DAILY AS NEEDED. FOR PAIN  90 tablet  3  . warfarin (COUMADIN) 5 MG tablet TAKE 1 TABLET BY MOUTH AS DIRECTED.  30 tablet  5  . [DISCONTINUED] amoxicillin-clavulanate (AUGMENTIN) 875-125 MG per tablet Take 1 tablet by mouth 2 (two) times daily.  20 tablet  1  . [DISCONTINUED] fluticasone (FLONASE) 50 MCG/ACT nasal spray 2 sprays in each nostril daily at bedtime  16 g  11  . [DISCONTINUED] vitamin C (ASCORBIC ACID) 500 MG tablet Take 500 mg by mouth daily.        No facility-administered encounter medications on file as of 01/12/2013.    Allergies  Allergen Reactions  . Amiodarone     Severe side effects per Pt--  . Atorvastatin     REACTION: muscle pain  . Codeine     REACTION: itching  . Erythromycin Other (See Comments)    unknown  . Ezetimibe     REACTION: INTOL to Zetia w/ cough  . Meperidine Hcl Other (See Comments)    REACTION: dizziness  . Morphine Other (See Comments)    Pt states it "makes her crazy"  . Neomycin-Bacitracin Zn-Polymyx Other (See Comments)    blisters  . Ropinirole Hydrochloride     REACTION: INTOL to Requip w/ sleep paralysis  . Simvastatin     REACTION: unable to walk--muscle pain    Past Medical History  Diagnosis  Date  . OSA (obstructive sleep apnea)   . HTN (hypertension)   . Atrial fibrillation     paroxysmal; on coumadin  . CAD (coronary artery disease)     a. cath 7/11: LM 40%, mild plaque disease in CFX, LAD, and RCA;  b. 2011 CABG with S-LAD and S-OM done at time of Aortic dissection repair  . Chronic diastolic CHF (congestive heart failure)     a. 08/2011 Echo: EF 55-60%, PASP  . Dissection of aorta, thoracic     a. Type A; s/p repair 7/11 with aortic root repair and CABG x 2   . ASD (atrial septal defect)     a. s/p repair 1982 at [facility]  . Right heart failure     a. 2/2 TR and RV dysfxn;  b. echo 4/12: EF 60%, mild LVH, mild AI, mild MR, mod LAE, mod RVE with mod dec. RVSF, mod RAE, mod to severe TR, PASP 58;  c. right heart cath 4/12:  RA mean 8, RV 46/1 with mean 6, PA 45/13 with mean 26, PCWP mean 14, CO 3.68, CI 2.1 (no sig pulmon HTN)  . GERD (gastroesophageal reflux disease)   . IBS (irritable bowel syndrome)   . Esophageal stricture   . Colonic polyp   . Peripheral neuropathy   . Back pain   . Fibromyalgia   . Anxiety   . HLD (hyperlipidemia)   . Borderline diabetes   . History of TIAs   . DJD (degenerative joint disease)   . Normocytic anemia   . Thrombocytopenia   . Macular degeneration   . Depression   . Esophageal dysmotilities     ROS: Positive for left shoulder pain, otherwise negative except as per HPI  BP 114/66  Pulse 57  Ht 5\' 7"  (1.702 m)  Wt 72.576 kg (160 lb)  BMI 25.05 kg/m2  SpO2 99%  PHYSICAL EXAM: Pt is alert and oriented, NAD HEENT: normal Neck: JVP - normal, carotids 2+= without bruits Lungs: CTA bilaterally CV: RRR with grade 2/6 systolic murmur at the left sternal border Abd: soft, NT, Positive BS, no hepatomegaly Ext: no C/C/E, distal pulses intact and equal Skin: warm/dry no rash  2-D echo: 04/21/2012: Left ventricle: Wall thickness was increased in a pattern of moderate LVH. Systolic function was normal. The estimated ejection  fraction was in the range of 50% to 55%.  ------------------------------------------------------------ Aortic valve: Doppler: There was no stenosis. Mild regurgitation.  ------------------------------------------------------------ Aorta: Ascending aorta: The ascending aorta  was mildly dilated.  ------------------------------------------------------------ Mitral valve: Doppler: There was no evidence for stenosis. Mild regurgitation. Peak gradient: 8mm Hg (D).  ------------------------------------------------------------ Left atrium: The atrium was moderately dilated.  ------------------------------------------------------------ Right ventricle: The cavity size was moderately dilated. Systolic function was moderately reduced.  ------------------------------------------------------------ Pulmonic valve: Doppler: Mild regurgitation.  ------------------------------------------------------------ Tricuspid valve: Doppler: Severe regurgitation.  ------------------------------------------------------------ Pulmonary artery: Systolic pressure was moderately increased.  ------------------------------------------------------------ Right atrium: The atrium was moderately dilated.  ------------------------------------------------------------ Pericardium: There was no pericardial effusion.  ------------------------------------------------------------ Systemic veins: Inferior vena cava: The vessel was dilated; the respirophasic diameter changes were blunted (< 50%); findings are consistent with elevated central venous pressure.  ASSESSMENT AND PLAN: 1. Atrial fibrillation. The patient is maintaining sinus rhythm on tedious and. She continues on Coumadin for long-term anticoagulation. She's had no further problems with epistaxis. She's tolerating her medical program well and she will continue the same. Will check a metabolic panel including magnesium prior to her return office visit in 4  months.  2. Chronic diastolic heart failure related to valvular heart disease with severe tricuspid regurgitation. The patient remains remarkably well compensated on a combination of Aldactone and torsemide. Maintenance of sinus rhythm as play the biggest role in controlling her heart failure. Will check an echocardiogram before her next office visit. She is currently New York Heart Association class I.  3. Coronary artery disease. The patient is status post CABG done emergently at the time of her aortic dissection. She is not on antiplatelet therapy because of long-term warfarin. She has no anginal symptoms.  Tonny Bollman 01/12/2013 12:11 PM

## 2013-01-12 NOTE — Patient Instructions (Addendum)
Your physician wants you to follow-up in: 4 MONTHS with Dr Excell Seltzer.  You will receive a reminder letter in the mail two months in advance. If you don't receive a letter, please call our office to schedule the follow-up appointment.  Your physician recommends that you return for lab work in: 4 MONTHS (BMP and Magnesium)  Your physician has requested that you have an echocardiogram in 4 MONTHS. Echocardiography is a painless test that uses sound waves to create images of your heart. It provides your doctor with information about the size and shape of your heart and how well your heart's chambers and valves are working. This procedure takes approximately one hour. There are no restrictions for this procedure.  Your physician recommends that you continue on your current medications as directed. Please refer to the Current Medication list given to you today.

## 2013-01-17 ENCOUNTER — Other Ambulatory Visit: Payer: Self-pay | Admitting: Pulmonary Disease

## 2013-02-09 ENCOUNTER — Emergency Department (HOSPITAL_COMMUNITY)
Admission: EM | Admit: 2013-02-09 | Discharge: 2013-02-09 | Disposition: A | Discharge status: Home Health | Payer: Medicare Other | Attending: Emergency Medicine | Admitting: Emergency Medicine

## 2013-02-09 ENCOUNTER — Observation Stay (HOSPITAL_COMMUNITY)
Admission: EM | Admit: 2013-02-09 | Discharge: 2013-02-10 | Disposition: A | Discharge status: Home / Self Care | Payer: Medicare Other | Attending: Internal Medicine | Admitting: Internal Medicine

## 2013-02-09 ENCOUNTER — Encounter (HOSPITAL_COMMUNITY): Payer: Self-pay | Admitting: Emergency Medicine

## 2013-02-09 ENCOUNTER — Encounter (HOSPITAL_COMMUNITY): Payer: Self-pay | Admitting: *Deleted

## 2013-02-09 DIAGNOSIS — I50810 Right heart failure, unspecified: Secondary | ICD-10-CM

## 2013-02-09 DIAGNOSIS — Q211 Atrial septal defect: Secondary | ICD-10-CM

## 2013-02-09 DIAGNOSIS — I5032 Chronic diastolic (congestive) heart failure: Secondary | ICD-10-CM | POA: Insufficient documentation

## 2013-02-09 DIAGNOSIS — I10 Essential (primary) hypertension: Secondary | ICD-10-CM | POA: Insufficient documentation

## 2013-02-09 DIAGNOSIS — T45515A Adverse effect of anticoagulants, initial encounter: Secondary | ICD-10-CM | POA: Insufficient documentation

## 2013-02-09 DIAGNOSIS — Z862 Personal history of diseases of the blood and blood-forming organs and certain disorders involving the immune mechanism: Secondary | ICD-10-CM | POA: Insufficient documentation

## 2013-02-09 DIAGNOSIS — R791 Abnormal coagulation profile: Secondary | ICD-10-CM | POA: Insufficient documentation

## 2013-02-09 DIAGNOSIS — Z8719 Personal history of other diseases of the digestive system: Secondary | ICD-10-CM | POA: Insufficient documentation

## 2013-02-09 DIAGNOSIS — F329 Major depressive disorder, single episode, unspecified: Secondary | ICD-10-CM | POA: Insufficient documentation

## 2013-02-09 DIAGNOSIS — B370 Candidal stomatitis: Secondary | ICD-10-CM

## 2013-02-09 DIAGNOSIS — Z860100 Personal history of colon polyps, unspecified: Secondary | ICD-10-CM

## 2013-02-09 DIAGNOSIS — R7309 Other abnormal glucose: Secondary | ICD-10-CM

## 2013-02-09 DIAGNOSIS — K219 Gastro-esophageal reflux disease without esophagitis: Secondary | ICD-10-CM

## 2013-02-09 DIAGNOSIS — Z8739 Personal history of other diseases of the musculoskeletal system and connective tissue: Secondary | ICD-10-CM | POA: Insufficient documentation

## 2013-02-09 DIAGNOSIS — I2510 Atherosclerotic heart disease of native coronary artery without angina pectoris: Secondary | ICD-10-CM

## 2013-02-09 DIAGNOSIS — I71019 Dissection of thoracic aorta, unspecified: Secondary | ICD-10-CM

## 2013-02-09 DIAGNOSIS — G609 Hereditary and idiopathic neuropathy, unspecified: Secondary | ICD-10-CM

## 2013-02-09 DIAGNOSIS — M545 Low back pain, unspecified: Secondary | ICD-10-CM

## 2013-02-09 DIAGNOSIS — R160 Hepatomegaly, not elsewhere classified: Secondary | ICD-10-CM

## 2013-02-09 DIAGNOSIS — E7800 Pure hypercholesterolemia, unspecified: Secondary | ICD-10-CM

## 2013-02-09 DIAGNOSIS — R1319 Other dysphagia: Secondary | ICD-10-CM

## 2013-02-09 DIAGNOSIS — R040 Epistaxis: Secondary | ICD-10-CM | POA: Insufficient documentation

## 2013-02-09 DIAGNOSIS — I4891 Unspecified atrial fibrillation: Secondary | ICD-10-CM

## 2013-02-09 DIAGNOSIS — K222 Esophageal obstruction: Secondary | ICD-10-CM

## 2013-02-09 DIAGNOSIS — Q2111 Secundum atrial septal defect: Secondary | ICD-10-CM

## 2013-02-09 DIAGNOSIS — Z7901 Long term (current) use of anticoagulants: Secondary | ICD-10-CM | POA: Insufficient documentation

## 2013-02-09 DIAGNOSIS — R5381 Other malaise: Secondary | ICD-10-CM | POA: Insufficient documentation

## 2013-02-09 DIAGNOSIS — F341 Dysthymic disorder: Secondary | ICD-10-CM

## 2013-02-09 DIAGNOSIS — K589 Irritable bowel syndrome without diarrhea: Secondary | ICD-10-CM

## 2013-02-09 DIAGNOSIS — I5033 Acute on chronic diastolic (congestive) heart failure: Secondary | ICD-10-CM

## 2013-02-09 DIAGNOSIS — Z8639 Personal history of other endocrine, nutritional and metabolic disease: Secondary | ICD-10-CM | POA: Insufficient documentation

## 2013-02-09 DIAGNOSIS — IMO0001 Reserved for inherently not codable concepts without codable children: Secondary | ICD-10-CM

## 2013-02-09 DIAGNOSIS — G4733 Obstructive sleep apnea (adult) (pediatric): Secondary | ICD-10-CM | POA: Insufficient documentation

## 2013-02-09 DIAGNOSIS — Z8673 Personal history of transient ischemic attack (TIA), and cerebral infarction without residual deficits: Secondary | ICD-10-CM | POA: Insufficient documentation

## 2013-02-09 DIAGNOSIS — R269 Unspecified abnormalities of gait and mobility: Secondary | ICD-10-CM

## 2013-02-09 DIAGNOSIS — K625 Hemorrhage of anus and rectum: Secondary | ICD-10-CM

## 2013-02-09 DIAGNOSIS — F411 Generalized anxiety disorder: Secondary | ICD-10-CM | POA: Insufficient documentation

## 2013-02-09 DIAGNOSIS — K603 Anal fistula, unspecified: Secondary | ICD-10-CM

## 2013-02-09 DIAGNOSIS — I480 Paroxysmal atrial fibrillation: Secondary | ICD-10-CM | POA: Diagnosis present

## 2013-02-09 DIAGNOSIS — I872 Venous insufficiency (chronic) (peripheral): Secondary | ICD-10-CM

## 2013-02-09 DIAGNOSIS — Z8669 Personal history of other diseases of the nervous system and sense organs: Secondary | ICD-10-CM | POA: Insufficient documentation

## 2013-02-09 DIAGNOSIS — I7101 Dissection of thoracic aorta: Secondary | ICD-10-CM

## 2013-02-09 DIAGNOSIS — D6832 Hemorrhagic disorder due to extrinsic circulating anticoagulants: Secondary | ICD-10-CM

## 2013-02-09 DIAGNOSIS — E119 Type 2 diabetes mellitus without complications: Secondary | ICD-10-CM | POA: Insufficient documentation

## 2013-02-09 DIAGNOSIS — Z8679 Personal history of other diseases of the circulatory system: Secondary | ICD-10-CM

## 2013-02-09 DIAGNOSIS — I509 Heart failure, unspecified: Secondary | ICD-10-CM | POA: Insufficient documentation

## 2013-02-09 DIAGNOSIS — H3530 Unspecified macular degeneration: Secondary | ICD-10-CM

## 2013-02-09 DIAGNOSIS — D62 Acute posthemorrhagic anemia: Secondary | ICD-10-CM

## 2013-02-09 DIAGNOSIS — M1990 Unspecified osteoarthritis, unspecified site: Secondary | ICD-10-CM

## 2013-02-09 DIAGNOSIS — Z79899 Other long term (current) drug therapy: Secondary | ICD-10-CM | POA: Insufficient documentation

## 2013-02-09 DIAGNOSIS — R7302 Impaired glucose tolerance (oral): Secondary | ICD-10-CM | POA: Diagnosis present

## 2013-02-09 DIAGNOSIS — F3289 Other specified depressive episodes: Secondary | ICD-10-CM | POA: Insufficient documentation

## 2013-02-09 DIAGNOSIS — I729 Aneurysm of unspecified site: Secondary | ICD-10-CM

## 2013-02-09 DIAGNOSIS — R609 Edema, unspecified: Secondary | ICD-10-CM

## 2013-02-09 DIAGNOSIS — R001 Bradycardia, unspecified: Secondary | ICD-10-CM

## 2013-02-09 LAB — CBC WITH DIFFERENTIAL/PLATELET
Eosinophils Absolute: 0.1 10*3/uL (ref 0.0–0.7)
Eosinophils Relative: 2 % (ref 0–5)
HCT: 34.8 % — ABNORMAL LOW (ref 36.0–46.0)
Hemoglobin: 11.6 g/dL — ABNORMAL LOW (ref 12.0–15.0)
Lymphocytes Relative: 22 % (ref 12–46)
Lymphs Abs: 0.9 10*3/uL (ref 0.7–4.0)
MCH: 32.2 pg (ref 26.0–34.0)
MCV: 96.7 fL (ref 78.0–100.0)
Monocytes Relative: 7 % (ref 3–12)
RBC: 3.6 MIL/uL — ABNORMAL LOW (ref 3.87–5.11)

## 2013-02-09 LAB — BASIC METABOLIC PANEL
BUN: 30 mg/dL — ABNORMAL HIGH (ref 6–23)
CO2: 30 mEq/L (ref 19–32)
Calcium: 9.6 mg/dL (ref 8.4–10.5)
GFR calc non Af Amer: 65 mL/min — ABNORMAL LOW (ref >90)
Glucose, Bld: 123 mg/dL — ABNORMAL HIGH (ref 70–99)

## 2013-02-09 LAB — HEMOGLOBIN AND HEMATOCRIT, BLOOD: Hemoglobin: 12.3 g/dL (ref 12.0–15.0)

## 2013-02-09 MED ORDER — FENTANYL CITRATE 0.05 MG/ML IJ SOLN
50.0000 ug | Freq: Once | INTRAMUSCULAR | Status: AC
  Administered 2013-02-09: 50 ug via INTRAVENOUS
  Filled 2013-02-09: qty 2

## 2013-02-09 NOTE — ED Provider Notes (Signed)
History     CSN: [id number]  Arrival date & time 02/09/13  1725   First MD Initiated Contact with Patient 02/09/13 2117      Chief Complaint  Patient presents with  . Epistaxis    (Consider location/radiation/quality/duration/timing/severity/associated sxs/prior treatment) Patient is a 78 y.o. female presenting with nosebleeds. The history is provided by the patient.  Epistaxis   She is currently anticoagulated for her atrial fibrillation. She has a history of nosebleeds in including an episode last year which required ligation of the sphenopalatine arteries. She had been admitted to the hospital here and had to be transferred to [facility] for the procedure to be done. She had recurrence of nosebleed last night and was seen in the ED earlier today. Her left nostril was packed with Merocel and she was told to return if she had any bleeding. She states that the packing has been saturated in small amount of blood continues to drip around the packing. She is concerned because of the severity of past bleeding episodes. INR earlier today was 2.05.  Past Medical History  Diagnosis Date  . OSA (obstructive sleep apnea)   . HTN (hypertension)   . Atrial fibrillation     paroxysmal; on coumadin  . CAD (coronary artery disease)     a. cath 7/11: LM 40%, mild plaque disease in CFX, LAD, and RCA;  b. 2011 CABG with S-LAD and S-OM done at time of Aortic dissection repair  . Chronic diastolic CHF (congestive heart failure)     a. 08/2011 Echo: EF 55-60%, PASP  . Dissection of aorta, thoracic     a. Type A; s/p repair 7/11 with aortic root repair and CABG x 2   . ASD (atrial septal defect)     a. s/p repair 1982 at [facility]  . Right heart failure     a. 2/2 TR and RV dysfxn;  b. echo 4/12: EF 60%, mild LVH, mild AI, mild MR, mod LAE, mod RVE with mod dec. RVSF, mod RAE, mod to severe TR, PASP 58;  c. right heart cath 4/12:  RA mean 8, RV 46/1 with mean 6, PA 45/13 with mean 26, PCWP mean 14,  CO 3.68, CI 2.1 (no sig pulmon HTN)  . GERD (gastroesophageal reflux disease)   . IBS (irritable bowel syndrome)   . Esophageal stricture   . Colonic polyp   . Peripheral neuropathy   . Back pain   . Fibromyalgia   . Anxiety   . HLD (hyperlipidemia)   . Borderline diabetes   . History of TIAs   . DJD (degenerative joint disease)   . Normocytic anemia   . Thrombocytopenia   . Macular degeneration   . Depression   . Esophageal dysmotilities     Past Surgical History  Procedure Laterality Date  . Asd repair  1982    [facility]  . Rotator cuff repair  2007    right  . Cardioversion      x 3  . Tubal ligation    . Appendectomy    . Vaginal hysterectomy  1987    A/P Repair With Cystocele and rectocele repair  . Tonsillectomy    . Oophorectomy  1994    BSO  . Pelvic laparoscopy  1994  . Emergency redo median sternotomy for hemiarch repair of acute type a aortic  dissection  06/05/2010  . Nasal hemorrhage control  12/27/2011    Procedure: EPISTAXIS CONTROL;  Surgeon: Melvenia Beam, MD;  Location:  [facility] OR;  Service: ENT;  Laterality: N/A;    Family History  Problem Relation Age of Onset  . Pancreatic cancer Sister   . Osteoporosis Sister   . Lung cancer Brother     x 2  . Heart attack Brother   . Melanoma Sister   . Diabetes Mother   . Hypertension Mother   . Colon cancer Sister   . ALS Mother   . Heart disease Brother     x 2    History  Substance Use Topics  . Smoking status: Never Smoker   . Smokeless tobacco: Never Used  . Alcohol Use: No    OB History   Grav Para Term Preterm Abortions TAB SAB Ect Mult Living   4 3 3  1     3       Review of Systems  HENT: Positive for nosebleeds.   All other systems reviewed and are negative.    Allergies  Amiodarone; Atorvastatin; Codeine; Erythromycin; Ezetimibe; Meperidine hcl; Morphine; Neomycin-bacitracin zn-polymyx; Ropinirole hydrochloride; and Simvastatin  Home Medications   Current Outpatient Rx  Name   Route  Sig  Dispense  Refill  . cholecalciferol (VITAMIN D) 1000 UNITS tablet   Oral   Take 1,000 Units by mouth daily.          . clorazepate (TRANXENE) 7.5 MG tablet      TAKE 1 TABLET BY MOUTH 3 TIMES A DAY AS NEEDED FOR NERVES   90 tablet   5   . dofetilide (TIKOSYN) 250 MCG capsule   Oral   Take 1 capsule (250 mcg total) by mouth 2 (two) times daily.         Marland Kitchen gabapentin (NEURONTIN) 300 MG capsule      TAKE 2 CAPSULES BY MOUTH AT BEDTIME   60 capsule   4   . glucosamine-chondroitin 500-400 MG tablet   Oral   Take 1 tablet by mouth 2 (two) times daily.         . metoprolol (LOPRESSOR) 25 MG tablet   Oral   Take 0.5 tablets (12.5 mg total) by mouth 2 (two) times daily.   33 tablet   11   . omeprazole (PRILOSEC) 20 MG capsule      TAKE 1 CAPSULE BY MOUTH  TWICE A DAY 30 MINUTES BEFORE MEALS   60 capsule   6   . potassium chloride SA (K-DUR,KLOR-CON) 20 MEQ tablet   Oral   Take 20 mEq by mouth daily.    1 tablet   0   . sertraline (ZOLOFT) 50 MG tablet      TAKE 1 TABLET BY MOUTH EVERY MORNING   30 tablet   6   . spironolactone (ALDACTONE) 25 MG tablet   Oral   Take 1 tablet (25 mg total) by mouth daily.   30 tablet   11   . torsemide (DEMADEX) 20 MG tablet   Oral   Take 3 tablets (60 mg total) by mouth daily.   90 tablet   11   . vitamin C (ASCORBIC ACID) 500 MG tablet   Oral   Take 500 mg by mouth daily.         Marland Kitchen warfarin (COUMADIN) 5 MG tablet   Oral   Take 5-7.5 mg by mouth daily. Take 1 and 1/2 tablets on Sunday and Thursday then Take 1 tablet the other days         . traMADol (ULTRAM) 50 MG tablet  TAKE 1 TABLET (50 MG TOTAL) BY MOUTH 3 (THREE) TIMES DAILY AS NEEDED. FOR PAIN   90 tablet   3     BP 109/69  Pulse 74  Temp(Src) 98.1 F (36.7 C) (Oral)  Resp 19  SpO2 98%  Physical Exam  Nursing note and vitals reviewed.  78 year old female, resting comfortably and in no acute distress. Vital signs are normal.  Oxygen saturation is 98%, which is normal. Head is normocephalic and atraumatic. PERRLA, EOMI. Oropharynx is clear. There is a Merocel nasal tampon in place in the left nostril which is blood-tinged and, when she tabs it with a tissue, bright red blood is on the tissue but there is no active bleeding around the tampon. The right nostril is dry without any evidence of blood and there is no blood on examination of the oropharynx. Neck is nontender and supple without adenopathy or JVD. Back is nontender and there is no CVA tenderness. Lungs are clear without rales, wheezes, or rhonchi. Chest is nontender. Heart has regular rate and rhythm with 2/6 holosystolic murmur. Abdomen is soft, flat, nontender without masses or hepatosplenomegaly and peristalsis is normoactive. Extremities have no cyanosis or edema, full range of motion is present. Venous stasis changes are present Skin is warm and dry without rash. Neurologic: Mental status is normal, cranial nerves are intact, there are no motor or sensory deficits.  ED Course  Procedures (including critical care time)  Results for orders placed during the hospital encounter of 02/09/13  HEMOGLOBIN AND HEMATOCRIT, BLOOD      Result Value Range   Hemoglobin 12.3  12.0 - 15.0 g/dL   HCT 86.5  78.4 - 69.6 %      1. Epistaxis, recurrent   2. Warfarin-induced coagulopathy, subsequent encounter       MDM  Epistaxis with low-grade ongoing bleeding. I reviewed her records and she had been admitted for severe epistaxis which had not been able to be controlled in the ED and she had cautery sphenopalatine arteries and then was going to have embolization which was not able to be done. That was the point at which she was transferred to [facility]. She is very anxious about her bleeding given her serious problems in the past. Case will be discussed with ENT. The nasal tampon is not removed.  Hemoglobin is stable. She's had no  significant bleeding while in the emergency department. After discussion with Dr. Jenne Pane of ENT, it was elected to admit her under observation status. I do not see an indication for transferring her to [facility]. Case is discussed with Dr.Duotova, [facility] hospitalist, who agrees to admit the patient.      Dione Booze, MD 02/10/13 [phone]

## 2013-02-09 NOTE — ED Notes (Signed)
Pt was here earlier today with nose bleed and sent home after nasal packing.  St's bleeding started again from left nostril.

## 2013-02-09 NOTE — ED Provider Notes (Signed)
Medical screening examination/treatment/procedure(s) were conducted as a shared visit with non-physician practitioner(s) and myself.  I personally evaluated the patient during the encounter  Pt with history of nosebleed requiring ENT surgery about 14months ago reports persistent trickling nose bleed for the last 24 hours, worse on the left but some on the right at times. Coming anteriorly and posteriorly.She is taking coumadin.   Vitals reviewed She has packed both nares at home with tissue. These were removed, there is no active bleeding but large clots in both nares, worse on the left. She was able to partially blow these out, but due to septal defect unable to completely clear clots. Discussed packing with the patient who consents. She requests pain meds prior, given IV fentanyl and topical Hurricaine spray. A Merocel packing was placed in L nare without difficulty which has helped clear some of the clot which she spit out.   Will continue to monitor for recurrent bleeding.   9:39 AM No current bleeding. Advised to leave packing in place. ENT follow-up in 3 days.   Charles B. Bernette Mayers, MD 02/09/13 [phone]

## 2013-02-09 NOTE — ED Notes (Signed)
Per EMS:  Pt's nose starting bleeding yesterday, pt st's she was hoping it would stop but it didn't.  Pt has had surgery for this in the past (once here, once at [facility]).  Family st's surgeon at [facility] told her to come here.  Pt was able to stop bleeding before EMS' arrival, pt used 1/2 bottle of afrin spray in the past 12-14 hours.  Pt had nose packed with cotton that is still in place.  A&O x 4, VSS.

## 2013-02-09 NOTE — ED Provider Notes (Signed)
History     CSN: [id number]  Arrival date & time 02/09/13  0619   None     No chief complaint on file.   (Consider location/radiation/quality/duration/timing/severity/associated sxs/prior treatment) HPI Comments: 78 y.o. Female presents with nose bleed since yesterday. Pt has had maxillary sinus surgery in the past and is on coumadin (typically therapeutic). Pt had done saline washes for years, hadn't done any in a while, but did do one yesterday when her sinuses felt "tight." Bleeding began shortly after and was controlled when she went to bed. Pt woke up at 3am to more blood, bilateral nares, and pulling clots out of her mouth. Pt is a former nurse and stopped the bleeding having packed her nose with cotton. Bleeding is well controlled at this time although pt keeps spitting up clots and can feel the blood dripping down the back of her throat. Airway intact.   Pt admits a generalized weakness she believes from being up most of the night.  Pt denies trauma, dizziness, nausea, vomiting, visual changes, or any pain at this time.   Note from last Feb: 78 year old female previously taking Coumadin for atrial fibrillation and aortic replacement who developed severe left epistaxis on 2/11 requiring surgical management.  Bleeding was found in both nasal passages at surgery and cautery was performed bilaterally.  Still, bleeding was not controlled so both nasal passages were packed for several days.  Since packing removal, she has has a few limited episodes of bright red bleeding but started bleeding heavily again this morning more from the right side.  She came to the ER where the ER staff placed a rapid rhino pack and bleeding has settled.  Scheduled now for cerebral arteriogram with possible embolization if bleeding vessel identified       Past Medical History  Diagnosis Date  . OSA (obstructive sleep apnea)   . HTN (hypertension)   . Atrial fibrillation     paroxysmal; on coumadin  . CAD  (coronary artery disease)     a. cath 7/11: LM 40%, mild plaque disease in CFX, LAD, and RCA;  b. 2011 CABG with S-LAD and S-OM done at time of Aortic dissection repair  . Chronic diastolic CHF (congestive heart failure)     a. 08/2011 Echo: EF 55-60%, PASP  . Dissection of aorta, thoracic     a. Type A; s/p repair 7/11 with aortic root repair and CABG x 2   . ASD (atrial septal defect)     a. s/p repair 1982 at [facility]  . Right heart failure     a. 2/2 TR and RV dysfxn;  b. echo 4/12: EF 60%, mild LVH, mild AI, mild MR, mod LAE, mod RVE with mod dec. RVSF, mod RAE, mod to severe TR, PASP 58;  c. right heart cath 4/12:  RA mean 8, RV 46/1 with mean 6, PA 45/13 with mean 26, PCWP mean 14, CO 3.68, CI 2.1 (no sig pulmon HTN)  . GERD (gastroesophageal reflux disease)   . IBS (irritable bowel syndrome)   . Esophageal stricture   . Colonic polyp   . Peripheral neuropathy   . Back pain   . Fibromyalgia   . Anxiety   . HLD (hyperlipidemia)   . Borderline diabetes   . History of TIAs   . DJD (degenerative joint disease)   . Normocytic anemia   . Thrombocytopenia   . Macular degeneration   . Depression   . Esophageal dysmotilities  Past Surgical History  Procedure Laterality Date  . Asd repair  1982    [facility]  . Rotator cuff repair  2007    right  . Cardioversion      x 3  . Tubal ligation    . Appendectomy    . Vaginal hysterectomy  1987    A/P Repair With Cystocele and rectocele repair  . Tonsillectomy    . Oophorectomy  1994    BSO  . Pelvic laparoscopy  1994  . Emergency redo median sternotomy for hemiarch repair of acute type a aortic  dissection  06/05/2010  . Nasal hemorrhage control  12/27/2011    Procedure: EPISTAXIS CONTROL;  Surgeon: Melvenia Beam, MD;  Location: [facility] OR;  Service: ENT;  Laterality: N/A;    Family History  Problem Relation Age of Onset  . Pancreatic cancer Sister   . Osteoporosis Sister   . Lung cancer Brother     x 2  . Heart attack  Brother   . Melanoma Sister   . Diabetes Mother   . Hypertension Mother   . Colon cancer Sister   . ALS Mother   . Heart disease Brother     x 2    History  Substance Use Topics  . Smoking status: Never Smoker   . Smokeless tobacco: Never Used  . Alcohol Use: No    OB History   Grav Para Term Preterm Abortions TAB SAB Ect Mult Living   4 3 3  1     3       Review of Systems  Constitutional: Negative for fever and diaphoresis.  HENT: Positive for nosebleeds. Negative for trouble swallowing, neck pain and neck stiffness.        Pulling clots out of her nose and spitting up clots most of the night.  Eyes: Negative for visual disturbance.  Respiratory: Negative for cough, chest tightness and shortness of breath.   Cardiovascular: Negative for chest pain and palpitations.  Gastrointestinal: Negative for nausea, vomiting, abdominal pain, diarrhea and constipation.  Genitourinary: Negative for dysuria.  Musculoskeletal: Negative for gait problem.  Neurological: Positive for weakness. Negative for dizziness, light-headedness, numbness and headaches.    Allergies  Amiodarone; Atorvastatin; Codeine; Erythromycin; Ezetimibe; Meperidine hcl; Morphine; Neomycin-bacitracin zn-polymyx; Ropinirole hydrochloride; and Simvastatin  Home Medications   Current Outpatient Rx  Name  Route  Sig  Dispense  Refill  . acetaminophen (TYLENOL) 325 MG tablet   Oral   Take 650 mg by mouth as needed. For pain         . amoxicillin (AMOXIL) 500 MG tablet      Take 4 tablets by mouth one hour prior to dental procedure   8 tablet   0   . beta carotene w/minerals (OCUVITE) tablet   Oral   Take 1 tablet by mouth 2 (two) times daily.           . Calcium Carbonate-Vit D-Min (CALCIUM 1200 PO)   Oral   Take 1,000 mg by mouth daily.         . cholecalciferol (VITAMIN D) 1000 UNITS tablet   Oral   Take 1,000 Units by mouth daily.          . clorazepate (TRANXENE) 7.5 MG tablet       TAKE 1 TABLET BY MOUTH 3 TIMES A DAY AS NEEDED FOR NERVES   90 tablet   5   . docusate sodium (COLACE) 100 MG capsule   Oral   Take  100 mg by mouth 2 (two) times daily.         Marland Kitchen dofetilide (TIKOSYN) 250 MCG capsule   Oral   Take 1 capsule (250 mcg total) by mouth 2 (two) times daily.         Marland Kitchen gabapentin (NEURONTIN) 300 MG capsule      TAKE 2 CAPSULES BY MOUTH AT BEDTIME   60 capsule   4   . glucosamine-chondroitin 500-400 MG tablet   Oral   Take 1 tablet by mouth 2 (two) times daily.         Marland Kitchen HYDROcodone-acetaminophen (NORCO/VICODIN) 5-325 MG per tablet   Oral   Take 1 tablet by mouth 3 (three) times daily as needed for pain.   50 tablet   0   . metoprolol (LOPRESSOR) 25 MG tablet   Oral   Take 0.5 tablets (12.5 mg total) by mouth 2 (two) times daily.   33 tablet   11   . omeprazole (PRILOSEC) 20 MG capsule      TAKE 1 CAPSULE BY MOUTH  TWICE A DAY 30 MINUTES BEFORE MEALS   60 capsule   6   . polyethylene glycol (MIRALAX / GLYCOLAX) packet      17 g. As Needed         . potassium chloride SA (K-DUR,KLOR-CON) 20 MEQ tablet   Oral   Take 20 mEq by mouth daily.    1 tablet   0   . SB SALINE NOSE NA   Nasal   Place 2 Squirts into the nose 2 (two) times daily.         . sertraline (ZOLOFT) 50 MG tablet      TAKE 1 TABLET BY MOUTH EVERY MORNING   30 tablet   6   . spironolactone (ALDACTONE) 25 MG tablet   Oral   Take 1 tablet (25 mg total) by mouth daily.   30 tablet   11   . torsemide (DEMADEX) 20 MG tablet   Oral   Take 3 tablets (60 mg total) by mouth daily.   90 tablet   11   . traMADol (ULTRAM) 50 MG tablet      TAKE 1 TABLET (50 MG TOTAL) BY MOUTH 3 (THREE) TIMES DAILY AS NEEDED. FOR PAIN   90 tablet   3   . warfarin (COUMADIN) 5 MG tablet      TAKE 1 TABLET BY MOUTH AS DIRECTED.   30 tablet   5     BP 136/66  Pulse 74  Temp(Src) 97.9 F (36.6 C) (Oral)  Resp 18  Ht 5\' 7"  (1.702 m)  Wt 159 lb (72.122 kg)  BMI  24.9 kg/m2  SpO2 98%  Physical Exam  Nursing note and vitals reviewed. Constitutional: She is oriented to person, place, and time. She appears well-developed and well-nourished. No distress.  HENT:  Head: Normocephalic and atraumatic.  Nose: Sinus tenderness present. Epistaxis is observed.    Mouth/Throat: No posterior oropharyngeal edema or posterior oropharyngeal erythema.  Blood down back of throat. Both nares packed with cotton placed by pt. Bleeding in nares controlled  Eyes: Conjunctivae and EOM are normal.  Neck: Normal range of motion. Neck supple.  No meningeal signs  Cardiovascular: Normal rate, regular rhythm and normal heart sounds.  Exam reveals no gallop and no friction rub.   No murmur heard. Pulmonary/Chest: Effort normal and breath sounds normal. No respiratory distress. She has no wheezes.  Abdominal: Soft. Bowel sounds are normal.  She exhibits no distension. There is no tenderness. There is no rebound and no guarding.  Musculoskeletal: Normal range of motion. She exhibits no edema and no tenderness.  Neurological: She is alert and oriented to person, place, and time. No cranial nerve deficit.  No focal deficits  Skin: Skin is warm and dry. She is not diaphoretic. No erythema.  Psychiatric: She has a normal mood and affect.    ED Course  Procedures (including critical care time)  Labs Reviewed  CBC WITH DIFFERENTIAL - Abnormal; Notable for the following:    RBC 3.60 (*)    Hemoglobin 11.6 (*)    HCT 34.8 (*)    Platelets 121 (*)    All other components within normal limits  BASIC METABOLIC PANEL - Abnormal; Notable for the following:    Glucose, Bld 123 (*)    BUN 30 (*)    GFR calc non Af Amer 65 (*)    GFR calc Af Amer 76 (*)    All other components within normal limits  PROTIME-INR - Abnormal; Notable for the following:    Prothrombin Time 22.3 (*)    INR 2.05 (*)    All other components within normal limits   No results found.   No diagnosis  found.    MDM  Will get basic labs, leave cotton in place for now to discuss w Dr. Bernette Mayers, rhino rocket vs. Cautery given pt hx. Poss ENT consult. Bleeding controlled for now. Pt NAD and comfortable. Family at bedside. Pt requests pain meds if rhino rocket is going in as she finds it very uncomfortable.  Dr. Bernette Mayers saw pt to control the bleeding. Pt being observed at this time. Dr. Bernette Mayers will re-evaluated.   Glade Nurse, PA-C 02/09/13 [phone]

## 2013-02-09 NOTE — ED Notes (Signed)
Pt discharged to home with family. NAD.  

## 2013-02-10 ENCOUNTER — Encounter (HOSPITAL_COMMUNITY): Payer: Self-pay | Admitting: Internal Medicine

## 2013-02-10 DIAGNOSIS — I10 Essential (primary) hypertension: Secondary | ICD-10-CM

## 2013-02-10 DIAGNOSIS — R040 Epistaxis: Secondary | ICD-10-CM

## 2013-02-10 DIAGNOSIS — D6832 Hemorrhagic disorder due to extrinsic circulating anticoagulants: Secondary | ICD-10-CM

## 2013-02-10 DIAGNOSIS — I2510 Atherosclerotic heart disease of native coronary artery without angina pectoris: Secondary | ICD-10-CM

## 2013-02-10 DIAGNOSIS — I4891 Unspecified atrial fibrillation: Secondary | ICD-10-CM

## 2013-02-10 LAB — CBC
HCT: 35.9 % — ABNORMAL LOW (ref 36.0–46.0)
MCH: 31.6 pg (ref 26.0–34.0)
MCHC: 33.1 g/dL (ref 30.0–36.0)
MCV: 95.2 fL (ref 78.0–100.0)
Platelets: 136 10*3/uL — ABNORMAL LOW (ref 150–400)
RDW: 13.2 % (ref 11.5–15.5)

## 2013-02-10 LAB — MRSA PCR SCREENING: MRSA by PCR: POSITIVE — AB

## 2013-02-10 MED ORDER — POTASSIUM CHLORIDE CRYS ER 20 MEQ PO TBCR
20.0000 meq | EXTENDED_RELEASE_TABLET | Freq: Every day | ORAL | Status: DC
  Administered 2013-02-10: 20 meq via ORAL
  Filled 2013-02-10: qty 1

## 2013-02-10 MED ORDER — DOFETILIDE 250 MCG PO CAPS
250.0000 ug | ORAL_CAPSULE | Freq: Two times a day (BID) | ORAL | Status: DC
  Administered 2013-02-10: 250 ug via ORAL
  Filled 2013-02-10: qty 1

## 2013-02-10 MED ORDER — METOPROLOL TARTRATE 12.5 MG HALF TABLET
12.5000 mg | ORAL_TABLET | Freq: Two times a day (BID) | ORAL | Status: DC
  Administered 2013-02-10 (×2): 12.5 mg via ORAL
  Filled 2013-02-10 (×3): qty 1

## 2013-02-10 MED ORDER — DOFETILIDE 250 MCG PO CAPS
250.0000 ug | ORAL_CAPSULE | Freq: Two times a day (BID) | ORAL | Status: DC
  Administered 2013-02-10: 250 ug via ORAL
  Filled 2013-02-10 (×3): qty 1

## 2013-02-10 MED ORDER — MUPIROCIN 2 % EX OINT
1.0000 "application " | TOPICAL_OINTMENT | Freq: Two times a day (BID) | CUTANEOUS | Status: DC
  Administered 2013-02-10 (×2): 1 via NASAL
  Filled 2013-02-10: qty 22

## 2013-02-10 MED ORDER — CHLORHEXIDINE GLUCONATE CLOTH 2 % EX PADS
6.0000 | MEDICATED_PAD | Freq: Every day | CUTANEOUS | Status: DC
  Administered 2013-02-10: 6 via TOPICAL

## 2013-02-10 MED ORDER — GABAPENTIN 300 MG PO CAPS
600.0000 mg | ORAL_CAPSULE | Freq: Every day | ORAL | Status: DC
  Administered 2013-02-10: 600 mg via ORAL
  Filled 2013-02-10 (×2): qty 2

## 2013-02-10 MED ORDER — VITAMIN C 500 MG PO TABS
500.0000 mg | ORAL_TABLET | Freq: Every day | ORAL | Status: DC
  Administered 2013-02-10: 500 mg via ORAL
  Filled 2013-02-10: qty 1

## 2013-02-10 MED ORDER — CLORAZEPATE DIPOTASSIUM 3.75 MG PO TABS
7.5000 mg | ORAL_TABLET | Freq: Three times a day (TID) | ORAL | Status: DC | PRN
  Administered 2013-02-10: 7.5 mg via ORAL
  Filled 2013-02-10: qty 2

## 2013-02-10 MED ORDER — SERTRALINE HCL 50 MG PO TABS
50.0000 mg | ORAL_TABLET | Freq: Every day | ORAL | Status: DC
  Administered 2013-02-10: 50 mg via ORAL
  Filled 2013-02-10: qty 1

## 2013-02-10 MED ORDER — SPIRONOLACTONE 25 MG PO TABS
25.0000 mg | ORAL_TABLET | Freq: Every day | ORAL | Status: DC
  Administered 2013-02-10: 25 mg via ORAL
  Filled 2013-02-10: qty 1

## 2013-02-10 MED ORDER — PANTOPRAZOLE SODIUM 40 MG PO TBEC
40.0000 mg | DELAYED_RELEASE_TABLET | Freq: Every day | ORAL | Status: DC
  Administered 2013-02-10: 40 mg via ORAL
  Filled 2013-02-10: qty 1

## 2013-02-10 MED ORDER — TRAMADOL HCL 50 MG PO TABS
50.0000 mg | ORAL_TABLET | Freq: Four times a day (QID) | ORAL | Status: DC | PRN
  Administered 2013-02-10: 50 mg via ORAL
  Filled 2013-02-10 (×2): qty 1

## 2013-02-10 MED ORDER — VITAMIN D3 25 MCG (1000 UNIT) PO TABS
1000.0000 [IU] | ORAL_TABLET | Freq: Every day | ORAL | Status: DC
  Administered 2013-02-10: 1000 [IU] via ORAL
  Filled 2013-02-10: qty 1

## 2013-02-10 MED ORDER — TORSEMIDE 20 MG PO TABS
60.0000 mg | ORAL_TABLET | Freq: Every day | ORAL | Status: DC
  Administered 2013-02-10: 60 mg via ORAL
  Filled 2013-02-10: qty 3

## 2013-02-10 NOTE — Progress Notes (Signed)
UR completed 

## 2013-02-10 NOTE — Progress Notes (Signed)
Patient ID: [name], female   DOB: 08-01-1935, 78 y.o.   MRN: [id number] I spoke with Dr. Eliott Nine, who is taking call for Dr. Tonny Bollman, [name]'s cardiologist.  She approved holding Coumadin until mid week when the pack will be removed.  I will discuss long-term Coumadin with Dr. Excell Seltzer next week.  She can be discharged, holding Coumadin, and follow-up with me Tuesday.

## 2013-02-10 NOTE — H&P (Addendum)
Patient had been seen and examined agree with above note and plan. Patient with epistaxis with history of anticoagulation on Coumadin due to a true fibrillation she is currently appears to be stable with improved epistaxis. Regarding epistaxis will obtain repeat CBC in the morning as well as INR. - ENT consult in a.m.  Regarding atrial fibrillation will hold her Coumadin for now she is rate controlled at this point continue home meds  Anticipate discharge later on today.  Boone Gear 6:35 AM

## 2013-02-10 NOTE — Progress Notes (Signed)
02/10/13 Patient to be discharged home this afternoon. IV site removed, discharge instructions reviewed with patient.

## 2013-02-10 NOTE — H&P (Signed)
Subjective:   [name] is a 78 y.o. female presents with epistaxis. She reports that she has had intermittent bleeding starting on (3/27). She had previously done saline washes for years but hadn't done any in a while. She decided to do one on 3/27 when her sinuses felt "tight." Bleeding began shortly after that and was controlled when she went to bed that evening. She woke up at 3 am on 3/28 to bleeding from bilateral nares and pulling clots out of her mouth. She was seen in the ED in AM on 3/28 and her left nostril was packed and she was discharged. She returned later in the evening with reoccurrence of bleeding.  [name] has a history of nosebleeds including an episode last year which required ligation of the sphenopalatine arteries. She had been admitted to the hospital here at [facility] and had to be transferred to [facility] for the procedure to be done. It is also significant that [name] has a history of atrial fibrillation and is anticoagulated with Coumadin (most recent INR 2.05).  Patient Active Problem List   Diagnosis Date Noted  . Epistaxis, recurrent 10/09/2012  . RVF (right ventricular failure)/ tricuspid regurgitation 04/22/2012  . Bradycardia 04/22/2012  . Gait abnormality 03/07/2012  . Anemia due to blood loss, acute 12/27/2011  . Aneurysm   . Macular degeneration   . Right heart failure 03/29/2011  . CORONARY ARTERY DISEASE 09/04/2010  . DISSECTING AORTIC ANEURYSM THORACIC 09/04/2010  . Edema 09/04/2010  . DYSPHAGIA 09/04/2010  . Acute on chronic diastolic heart failure 07/16/2010  . HEPATOMEGALY 03/09/2010  . ANAL FISTULA 01/07/2010  . IRRITABLE BOWEL SYNDROME 11/28/2009  . RECTAL BLEEDING 11/28/2009  . PERSONAL HX COLONIC POLYPS 11/28/2009  . CANDIDIASIS, ORAL 09/25/2009  . TRANSIENT ISCHEMIC ATTACKS, HX OF 07/19/2009  . ANXIETY DEPRESSION 06/24/2009  . GERD 07/05/2008  . PERIPHERAL NEUROPATHY 12/16/2007  . ESOPHAGEAL STRICTURE 12/16/2007  . ATRIAL  SEPTAL DEFECT 12/16/2007  . DIABETES MELLITUS, BORDERLINE 12/16/2007  . HYPERCHOLESTEROLEMIA 10/03/2007  . OBSTRUCTIVE SLEEP APNEA 10/03/2007  . HYPERTENSION 10/03/2007  . Atrial fibrillation 10/03/2007  . VENOUS INSUFFICIENCY 10/03/2007  . DEGENERATIVE JOINT DISEASE 10/03/2007  . BACK PAIN, LUMBAR 10/03/2007  . FIBROMYALGIA 10/03/2007   Past Medical History  Diagnosis Date  . OSA (obstructive sleep apnea)   . HTN (hypertension)   . Atrial fibrillation     paroxysmal; on coumadin  . CAD (coronary artery disease)     a. cath 7/11: LM 40%, mild plaque disease in CFX, LAD, and RCA;  b. 2011 CABG with S-LAD and S-OM done at time of Aortic dissection repair  . Chronic diastolic CHF (congestive heart failure)     a. 08/2011 Echo: EF 55-60%, PASP  . Dissection of aorta, thoracic     a. Type A; s/p repair 7/11 with aortic root repair and CABG x 2   . ASD (atrial septal defect)     a. s/p repair 1982 at [facility]  . Right heart failure     a. 2/2 TR and RV dysfxn;  b. echo 4/12: EF 60%, mild LVH, mild AI, mild MR, mod LAE, mod RVE with mod dec. RVSF, mod RAE, mod to severe TR, PASP 58;  c. right heart cath 4/12:  RA mean 8, RV 46/1 with mean 6, PA 45/13 with mean 26, PCWP mean 14, CO 3.68, CI 2.1 (no sig pulmon HTN)  . GERD (gastroesophageal reflux disease)   . IBS (irritable bowel syndrome)   .  Esophageal stricture   . Colonic polyp   . Peripheral neuropathy   . Back pain   . Fibromyalgia   . Anxiety   . HLD (hyperlipidemia)   . Borderline diabetes   . History of TIAs   . DJD (degenerative joint disease)   . Normocytic anemia   . Thrombocytopenia   . Macular degeneration   . Depression   . Esophageal dysmotilities     Past Surgical History  Procedure Laterality Date  . Asd repair  1982    [facility]  . Rotator cuff repair  2007    right  . Cardioversion      x 3  . Tubal ligation    . Appendectomy    . Vaginal hysterectomy  1987    A/P Repair With Cystocele and rectocele  repair  . Tonsillectomy    . Oophorectomy  1994    BSO  . Pelvic laparoscopy  1994  . Emergency redo median sternotomy for hemiarch repair of acute type a aortic  dissection  06/05/2010  . Nasal hemorrhage control  12/27/2011    Procedure: EPISTAXIS CONTROL;  Surgeon: Melvenia Beam, MD;  Location: [facility] OR;  Service: ENT;  Laterality: N/A;    Prescriptions prior to admission  Medication Sig Dispense Refill  . cholecalciferol (VITAMIN D) 1000 UNITS tablet Take 1,000 Units by mouth daily.       . clorazepate (TRANXENE) 7.5 MG tablet TAKE 1 TABLET BY MOUTH 3 TIMES A DAY AS NEEDED FOR NERVES  90 tablet  5  . dofetilide (TIKOSYN) 250 MCG capsule Take 1 capsule (250 mcg total) by mouth 2 (two) times daily.      Marland Kitchen gabapentin (NEURONTIN) 300 MG capsule TAKE 2 CAPSULES BY MOUTH AT BEDTIME  60 capsule  4  . glucosamine-chondroitin 500-400 MG tablet Take 1 tablet by mouth 2 (two) times daily.      . metoprolol (LOPRESSOR) 25 MG tablet Take 0.5 tablets (12.5 mg total) by mouth 2 (two) times daily.  33 tablet  11  . omeprazole (PRILOSEC) 20 MG capsule TAKE 1 CAPSULE BY MOUTH  TWICE A DAY 30 MINUTES BEFORE MEALS  60 capsule  6  . potassium chloride SA (K-DUR,KLOR-CON) 20 MEQ tablet Take 20 mEq by mouth daily.   1 tablet  0  . sertraline (ZOLOFT) 50 MG tablet TAKE 1 TABLET BY MOUTH EVERY MORNING  30 tablet  6  . spironolactone (ALDACTONE) 25 MG tablet Take 1 tablet (25 mg total) by mouth daily.  30 tablet  11  . torsemide (DEMADEX) 20 MG tablet Take 3 tablets (60 mg total) by mouth daily.  90 tablet  11  . vitamin C (ASCORBIC ACID) 500 MG tablet Take 500 mg by mouth daily.      Marland Kitchen warfarin (COUMADIN) 5 MG tablet Take 5-7.5 mg by mouth daily. Take 1 and 1/2 tablets on Sunday and Thursday then Take 1 tablet the other days      . traMADol (ULTRAM) 50 MG tablet TAKE 1 TABLET (50 MG TOTAL) BY MOUTH 3 (THREE) TIMES DAILY AS NEEDED. FOR PAIN  90 tablet  3   Allergies  Allergen Reactions  . Amiodarone     Severe  side effects per Pt--  . Atorvastatin     REACTION: muscle pain  . Codeine     REACTION: itching  . Crestor (Rosuvastatin) Other (See Comments)    Muscle weakness  . Erythromycin Other (See Comments)    unknown  . Ezetimibe  REACTION: INTOL to Zetia w/ cough  . Meperidine Hcl Other (See Comments)    REACTION: dizziness  . Morphine Other (See Comments)    Pt states it "makes her crazy"  . Neomycin-Bacitracin Zn-Polymyx Other (See Comments)    blisters  . Ropinirole Hydrochloride     REACTION: INTOL to Requip w/ sleep paralysis  . Simvastatin     REACTION: unable to walk--muscle pain    History  Substance Use Topics  . Smoking status: Never Smoker   . Smokeless tobacco: Never Used  . Alcohol Use: No    Family History  Problem Relation Age of Onset  . Pancreatic cancer Sister   . Osteoporosis Sister   . Lung cancer Brother     x 2  . Heart attack Brother   . Melanoma Sister   . Diabetes Mother   . Hypertension Mother   . Colon cancer Sister   . ALS Mother   . Heart disease Brother     x 2    Review of Systems Positive for epistaxis and fatigue. All systems were reviewed and are otherwise negative.  Objective:   Patient Vitals for the past 8 hrs:  BP Temp Temp src Pulse Resp SpO2 Height Weight  02/10/13 0056 - - - - - - 5\' 7"  (1.702 m) 61.3 kg (135 lb 2.3 oz)  02/09/13 2359 115/63 mmHg 97.6 F (36.4 C) Oral 73 - 98 % - -  02/09/13 2345 105/67 mmHg - - 71 18 94 % - -  02/09/13 2330 109/69 mmHg - - 71 16 94 % - -  02/09/13 2300 114/78 mmHg - - 76 19 99 % - -  02/09/13 2230 104/88 mmHg - - 69 16 96 % - -  02/09/13 2215 100/60 mmHg - - 63 16 97 % - -  02/09/13 2200 113/57 mmHg - - 69 18 98 % - -  02/09/13 2145 120/69 mmHg - - 68 13 98 % - -  02/09/13 2130 122/65 mmHg - - 66 17 99 % - -  02/09/13 2045 109/69 mmHg - - 74 19 98 % - -  02/09/13 2030 118/76 mmHg - - 78 18 97 % - -  02/09/13 2015 104/62 mmHg - - 72 19 97 % - -  02/09/13 2000 118/61 mmHg - - 73 26  97 % - -  02/09/13 1945 107/53 mmHg - - 71 22 97 % - -          General appearance: alert, cooperative and no distress Nose: packing in left nare, currently no bleeding apparent around packing, no bleeding noted from right nare Throat: mucous membranes moist, no blood note in throat or on soft palate Lungs: clear to auscultation bilaterally Heart: regular rate and rhythm and with holosystolic murmur 2/6 Abdomen: soft, non-tender; bowel sounds normal; no masses,  no organomegaly Extremities: extremities normal, atraumatic, no cyanosis or edema and venous stasis changes noted Pulses: 2+ and symmetric Skin: no erythema or rash Neurologic: Grossly normal  Results for orders placed during the hospital encounter of 02/09/13 (from the past 48 hour(s))  HEMOGLOBIN AND HEMATOCRIT, BLOOD     Status: None   Collection Time    02/09/13  9:39 PM      Result Value Range   Hemoglobin 12.3  12.0 - 15.0 g/dL   HCT 16.1  09.6 - 04.5 %  MRSA PCR SCREENING     Status: Abnormal   Collection Time    02/10/13  1:30 AM  Result Value Range   MRSA by PCR POSITIVE (*) NEGATIVE   Comment:            The GeneXpert MRSA Assay (FDA     approved for NASAL specimens     only), is one component of a     comprehensive MRSA colonization     surveillance program. It is not     intended to diagnose MRSA     infection nor to guide or     monitor treatment for     MRSA infections.     RESULT CALLED TO, READ BACK BY AND VERIFIED WITH:     LANEY,E RN [phone] AT 0325 SKEEN,P    IMPRESSION AND PLAN:  Epistaxis. Admit to inpatient - [facility]. Hold Coumadin. ENT consult in AM. Monitor H&H.  History of atrial fibrillation. Placed on telemetry. Continue Metoprolol and Tikosyn. Coumadin on hold due to epistaxis.   Congestive Heart Failure. Continue Torsemide, Spironolactone.  Peripheral neuropathy. Continue Gabapentin.  GERD. Protonix daily.  Osteoarthritis. Prn Ultram.  FEN:  Defer IVF due to  CHF, electrolytes monitored and addressed as indicated, heart healthy diet.  DVT prophylaxis: SCD's  ETHICS: Full code.

## 2013-02-10 NOTE — ED Notes (Signed)
Pt. states she is still bleeding through packing in her left nostril. Dr. Preston Fleeting in to see Pt. Aware of amount. No new orders.

## 2013-02-10 NOTE — Discharge Summary (Signed)
PATIENT DETAILS Name: [name] Age: 78 y.o. Sex: female Date of Birth: 10/17/1935 MRN: [id number]. Admit Date: 02/09/2013 Admitting Physician: Therisa Doyne, MD WUJ:WJXBJ,YNWGN M, MD  Recommendations for Outpatient Follow-up:  1. Given epistaxis-holding Coumadin-patient's primary cardiologist to assess risks vs benefits of further anticoagulation in this scenario recurrent epistaxis. 2. Keep nasal packing in place-will seen by Dr. Jenne Pane on 4/1  PRIMARY DISCHARGE DIAGNOSIS:  Active Problems:   HYPERTENSION   Atrial fibrillation   DIABETES MELLITUS, BORDERLINE   Epistaxis, recurrent   Warfarin-induced coagulopathy      PAST MEDICAL HISTORY: Past Medical History  Diagnosis Date  . OSA (obstructive sleep apnea)   . HTN (hypertension)   . Atrial fibrillation     paroxysmal; on coumadin  . CAD (coronary artery disease)     a. cath 7/11: LM 40%, mild plaque disease in CFX, LAD, and RCA;  b. 2011 CABG with S-LAD and S-OM done at time of Aortic dissection repair  . Chronic diastolic CHF (congestive heart failure)     a. 08/2011 Echo: EF 55-60%, PASP  . Dissection of aorta, thoracic     a. Type A; s/p repair 7/11 with aortic root repair and CABG x 2   . ASD (atrial septal defect)     a. s/p repair 1982 at [facility]  . Right heart failure     a. 2/2 TR and RV dysfxn;  b. echo 4/12: EF 60%, mild LVH, mild AI, mild MR, mod LAE, mod RVE with mod dec. RVSF, mod RAE, mod to severe TR, PASP 58;  c. right heart cath 4/12:  RA mean 8, RV 46/1 with mean 6, PA 45/13 with mean 26, PCWP mean 14, CO 3.68, CI 2.1 (no sig pulmon HTN)  . GERD (gastroesophageal reflux disease)   . IBS (irritable bowel syndrome)   . Esophageal stricture   . Colonic polyp   . Peripheral neuropathy   . Back pain   . Fibromyalgia   . Anxiety   . HLD (hyperlipidemia)   . Borderline diabetes   . History of TIAs   . DJD (degenerative joint disease)   . Normocytic anemia   . Thrombocytopenia   .  Macular degeneration   . Depression   . Esophageal dysmotilities     DISCHARGE MEDICATIONS:   Medication List    STOP taking these medications       warfarin 5 MG tablet  Commonly known as:  COUMADIN      TAKE these medications       cholecalciferol 1000 UNITS tablet  Commonly known as:  VITAMIN D  Take 1,000 Units by mouth daily.     clorazepate 7.5 MG tablet  Commonly known as:  TRANXENE  TAKE 1 TABLET BY MOUTH 3 TIMES A DAY AS NEEDED FOR NERVES     dofetilide 250 MCG capsule  Commonly known as:  TIKOSYN  Take 1 capsule (250 mcg total) by mouth 2 (two) times daily.     gabapentin 300 MG capsule  Commonly known as:  NEURONTIN  TAKE 2 CAPSULES BY MOUTH AT BEDTIME     glucosamine-chondroitin 500-400 MG tablet  Take 1 tablet by mouth 2 (two) times daily.     metoprolol tartrate 25 MG tablet  Commonly known as:  LOPRESSOR  Take 0.5 tablets (12.5 mg total) by mouth 2 (two) times daily.     omeprazole 20 MG capsule  Commonly known as:  PRILOSEC  TAKE 1 CAPSULE BY MOUTH  TWICE A  DAY 30 MINUTES BEFORE MEALS     potassium chloride SA 20 MEQ tablet  Commonly known as:  K-DUR,KLOR-CON  Take 20 mEq by mouth daily.     sertraline 50 MG tablet  Commonly known as:  ZOLOFT  TAKE 1 TABLET BY MOUTH EVERY MORNING     spironolactone 25 MG tablet  Commonly known as:  ALDACTONE  Take 1 tablet (25 mg total) by mouth daily.     torsemide 20 MG tablet  Commonly known as:  DEMADEX  Take 3 tablets (60 mg total) by mouth daily.     traMADol 50 MG tablet  Commonly known as:  ULTRAM  TAKE 1 TABLET (50 MG TOTAL) BY MOUTH 3 (THREE) TIMES DAILY AS NEEDED. FOR PAIN     vitamin C 500 MG tablet  Commonly known as:  ASCORBIC ACID  Take 500 mg by mouth daily.         BRIEF HPI:  See H&P, Labs, Consult and Test reports for all details in brief, is a 78 y.o. female presents with epistaxis. She reports that she has had intermittent bleeding starting on (3/27). She had previously done  saline washes for years but hadn't done any in a while. She decided to do one on 3/27 when her sinuses felt "tight." Bleeding began shortly after that and was controlled when she went to bed that evening. She woke up at 3 am on 3/28 to bleeding from bilateral nares and pulling clots out of her mouth. She was seen in the ED in AM on 3/28 and her left nostril was packed and she was discharged. She returned later in the evening with reoccurrence of bleeding.   CONSULTATIONS:   cardiology  PERTINENT RADIOLOGIC STUDIES: No results found.   PERTINENT LAB RESULTS: CBC:  Recent Labs  02/09/13 0638 02/09/13 2139 02/10/13 0837  WBC 4.0  --  5.2  HGB 11.6* 12.3 11.9*  HCT 34.8* 37.1 35.9*  PLT 121*  --  136*   CMET CMP     Component Value Date/Time   NA 140 02/09/2013 0638   K 3.5 02/09/2013 0638   CL 100 02/09/2013 0638   CO2 30 02/09/2013 0638   GLUCOSE 123* 02/09/2013 0638   BUN 30* 02/09/2013 0638   CREATININE 0.85 02/09/2013 0638   CREATININE 0.91 09/22/2011 1333   CALCIUM 9.6 02/09/2013 0638   PROT 7.8 03/07/2012 1101   ALBUMIN 4.3 03/07/2012 1101   AST 30 03/07/2012 1101   ALT 14 03/07/2012 1101   ALKPHOS 61 03/07/2012 1101   BILITOT 0.7 03/07/2012 1101   GFRNONAA 65* 02/09/2013 0638   GFRAA 76* 02/09/2013 0638    GFR Estimated Creatinine Clearance: 55.3 ml/min (by C-G formula based on Cr of 0.85). No results found for this basename: LIPASE, AMYLASE,  in the last 72 hours No results found for this basename: CKTOTAL, CKMB, CKMBINDEX, TROPONINI,  in the last 72 hours No components found with this basename: POCBNP,  No results found for this basename: DDIMER,  in the last 72 hours No results found for this basename: HGBA1C,  in the last 72 hours No results found for this basename: CHOL, HDL, LDLCALC, TRIG, CHOLHDL, LDLDIRECT,  in the last 72 hours No results found for this basename: TSH, T4TOTAL, FREET3, T3FREE, THYROIDAB,  in the last 72 hours No results found for this basename:  VITAMINB12, FOLATE, FERRITIN, TIBC, IRON, RETICCTPCT,  in the last 72 hours Coags:  Recent Labs  02/09/13 0638 02/10/13 0837  INR 2.05*  1.94*   Microbiology: Recent Results (from the past 240 hour(s))  MRSA PCR SCREENING     Status: Abnormal   Collection Time    02/10/13  1:30 AM      Result Value Range Status   MRSA by PCR POSITIVE (*) NEGATIVE Final   Comment:            The GeneXpert MRSA Assay (FDA     approved for NASAL specimens     only), is one component of a     comprehensive MRSA colonization     surveillance program. It is not     intended to diagnose MRSA     infection nor to guide or     monitor treatment for     MRSA infections.     RESULT CALLED TO, READ BACK BY AND VERIFIED WITH:     LANEY,E RN [phone] AT 0325 SKEEN,P     BRIEF HOSPITAL COURSE:   Active Problems:   Epistaxis - Apparently this has been a recurrent but intermittent issue for the patient, she has required in the recent past transfer to [facility] for evaluation by ENT and ligation of the culprit vessel. She started having epistaxis began on 3/27, presented to the ED and was discharged only to come back on 3/28 with recurrent epistaxis. Her left nasal cavity was packed, Coumadin was held and she was admitted for further observation. Dr. Jenne Pane ENT on call saw the patient this morning, he suggested that we continue to hold Coumadin, he was of the opinion that the risk of bleeding outweigh any long-term benefits of Coumadin at this time. He was of the view that perhaps we could permanently stop long-term anticoagulation, he discussed the case with cardiology on call who did recommend we stop Coumadin for now. Dr. Jenne Pane will discuss with Dr. Army Fossa primary cardiologist next week regarding further long-term anticoagulation issues. For now, the bleeding seems to have subsided, patient is stable to be discharged home with nasal packing in place, patient will followup with Dr. Jenne Pane on 02/14/12 for further  care.  Atrial fibrillation - This is stable, Coumadin has been temporarily discontinued to the patient is seen by Dr. Jenne Pane on 4/1 and also by her primary cardiologist.  Rest of her medical issues were stable  TODAY-DAY OF DISCHARGE:  Subjective:   [name] today has no headache,no chest abdominal pain,no new weakness tingling or numbness, feels much better wants to go home today.  Objective:   Blood pressure 92/63, pulse 69, temperature 97.9 F (36.6 C), temperature source Oral, resp. rate 20, height 5\' 7"  (1.702 m), weight 61.3 kg (135 lb 2.3 oz), SpO2 94.00%. No intake or output data in the 24 hours ending 02/10/13 1412  Exam Awake Alert, Oriented *3, No new F.N deficits, Normal affect [place].AT,PERRAL Supple Neck,No JVD, No cervical lymphadenopathy appriciated.  Symmetrical Chest wall movement, Good air movement bilaterally, CTAB RRR,No Gallops,Rubs or new Murmurs, No Parasternal Heave +ve B.Sounds, Abd Soft, Non tender, No organomegaly appriciated, No rebound -guarding or rigidity. No Cyanosis, Clubbing or edema, No new Rash or bruise  DISCHARGE CONDITION: Stable  DISPOSITION: HOME  DISCHARGE INSTRUCTIONS:    Activity:  As tolerated   Diet recommendation: Diabetic Diet      Follow-up Information   Follow up with BATES, DWIGHT, MD. Schedule an appointment as soon as possible for a visit on 02/13/2013.   Contact information:   [address] [place] [address] [phone]  Follow up with Tonny Bollman, MD. Schedule an appointment as soon as possible for a visit in 1 week.   Contact information:   [address] [place] [address] [phone]       Follow up with NADEL,SCOTT M, MD. Schedule an appointment as soon as possible for a visit in 2 weeks.   Contact information:   [address] [place] [address] [phone]         Total Time spent on discharge equals 45  minutes.  SignedJeoffrey Massed 02/10/2013 2:12 PM

## 2013-02-10 NOTE — Progress Notes (Signed)
Pt admitted to unit from ED. Pt is A&O, VS stable, and skin intact. Pt is currently resting comfortably in bed with daughter at bedside.

## 2013-02-10 NOTE — Consult Note (Signed)
Reason for Consult:epistaxis Referring Physician: hospitalist  [name] is an 78 y.o. female.  HPI: 78 year old female known to our practice with complicated history of epistaxis requiring surgical management in [place].  She is not able to be treated endovascularly due to vascular disease.  She takes chronic Coumadin.  She was treated in February 2013 surgically and has had only minor nosebleeding since then until yesterday when she bled significantly from the left nose.  She came to the ER where a pack was placed.  She had no more heavy bleeding but had some minor bloody drainage from the nose and in the throat.  Thus, she returned to the ER and was admitted to the hospitalist service.  Her last INR check was near 2.  She has not had Coumadin since a 7.5 mg dosing two days ago.  She continues to have bloody drainage that is very minor.  Past Medical History  Diagnosis Date  . OSA (obstructive sleep apnea)   . HTN (hypertension)   . Atrial fibrillation     paroxysmal; on coumadin  . CAD (coronary artery disease)     a. cath 7/11: LM 40%, mild plaque disease in CFX, LAD, and RCA;  b. 2011 CABG with S-LAD and S-OM done at time of Aortic dissection repair  . Chronic diastolic CHF (congestive heart failure)     a. 08/2011 Echo: EF 55-60%, PASP  . Dissection of aorta, thoracic     a. Type A; s/p repair 7/11 with aortic root repair and CABG x 2   . ASD (atrial septal defect)     a. s/p repair 1982 at [facility]  . Right heart failure     a. 2/2 TR and RV dysfxn;  b. echo 4/12: EF 60%, mild LVH, mild AI, mild MR, mod LAE, mod RVE with mod dec. RVSF, mod RAE, mod to severe TR, PASP 58;  c. right heart cath 4/12:  RA mean 8, RV 46/1 with mean 6, PA 45/13 with mean 26, PCWP mean 14, CO 3.68, CI 2.1 (no sig pulmon HTN)  . GERD (gastroesophageal reflux disease)   . IBS (irritable bowel syndrome)   . Esophageal stricture   . Colonic polyp   . Peripheral neuropathy   . Back pain   .  Fibromyalgia   . Anxiety   . HLD (hyperlipidemia)   . Borderline diabetes   . History of TIAs   . DJD (degenerative joint disease)   . Normocytic anemia   . Thrombocytopenia   . Macular degeneration   . Depression   . Esophageal dysmotilities     Past Surgical History  Procedure Laterality Date  . Asd repair  1982    [facility]  . Rotator cuff repair  2007    right  . Cardioversion      x 3  . Tubal ligation    . Appendectomy    . Vaginal hysterectomy  1987    A/P Repair With Cystocele and rectocele repair  . Tonsillectomy    . Oophorectomy  1994    BSO  . Pelvic laparoscopy  1994  . Emergency redo median sternotomy for hemiarch repair of acute type a aortic  dissection  06/05/2010  . Nasal hemorrhage control  12/27/2011    Procedure: EPISTAXIS CONTROL;  Surgeon: Melvenia Beam, MD;  Location: [facility] OR;  Service: ENT;  Laterality: N/A;    Family History  Problem Relation Age of Onset  . Pancreatic cancer Sister   .  Osteoporosis Sister   . Lung cancer Brother     x 2  . Heart attack Brother   . Melanoma Sister   . Diabetes Mother   . Hypertension Mother   . Colon cancer Sister   . ALS Mother   . Heart disease Brother     x 2    Social History:  reports that she has never smoked. She has never used smokeless tobacco. She reports that she does not drink alcohol or use illicit drugs.  Allergies:  Allergies  Allergen Reactions  . Amiodarone     Severe side effects per Pt--  . Atorvastatin     REACTION: muscle pain  . Codeine     REACTION: itching  . Crestor (Rosuvastatin) Other (See Comments)    Muscle weakness  . Erythromycin Other (See Comments)    unknown  . Ezetimibe     REACTION: INTOL to Zetia w/ cough  . Meperidine Hcl Other (See Comments)    REACTION: dizziness  . Morphine Other (See Comments)    Pt states it "makes her crazy"  . Neomycin-Bacitracin Zn-Polymyx Other (See Comments)    blisters  . Ropinirole Hydrochloride     REACTION: INTOL to Requip  w/ sleep paralysis  . Simvastatin     REACTION: unable to walk--muscle pain    Medications: I have reviewed the patient's current medications.  Results for orders placed during the hospital encounter of 02/09/13 (from the past 48 hour(s))  HEMOGLOBIN AND HEMATOCRIT, BLOOD     Status: None   Collection Time    02/09/13  9:39 PM      Result Value Range   Hemoglobin 12.3  12.0 - 15.0 g/dL   HCT 16.1  09.6 - 04.5 %  MRSA PCR SCREENING     Status: Abnormal   Collection Time    02/10/13  1:30 AM      Result Value Range   MRSA by PCR POSITIVE (*) NEGATIVE   Comment:            The GeneXpert MRSA Assay (FDA     approved for NASAL specimens     only), is one component of a     comprehensive MRSA colonization     surveillance program. It is not     intended to diagnose MRSA     infection nor to guide or     monitor treatment for     MRSA infections.     RESULT CALLED TO, READ BACK BY AND VERIFIED WITH:     LANEY,E RN [phone] AT 0325 SKEEN,P  CBC     Status: Abnormal   Collection Time    02/10/13  8:37 AM      Result Value Range   WBC 5.2  4.0 - 10.5 K/uL   RBC 3.77 (*) 3.87 - 5.11 MIL/uL   Hemoglobin 11.9 (*) 12.0 - 15.0 g/dL   HCT 91.4 (*) 78.2 - 95.6 %   MCV 95.2  78.0 - 100.0 fL   MCH 31.6  26.0 - 34.0 pg   MCHC 33.1  30.0 - 36.0 g/dL   RDW 21.3  08.6 - 57.8 %   Platelets 136 (*) 150 - 400 K/uL  PROTIME-INR     Status: Abnormal   Collection Time    02/10/13  8:37 AM      Result Value Range   Prothrombin Time 21.4 (*) 11.6 - 15.2 seconds   INR 1.94 (*) 0.00 - 1.49  No results found.  Review of Systems  Neurological: Positive for headaches.  All other systems reviewed and are negative.   Blood pressure 92/63, pulse 69, temperature 97.9 F (36.6 C), temperature source Oral, resp. rate 20, height 5\' 7"  (1.702 m), weight 61.3 kg (135 lb 2.3 oz), SpO2 94.00%. Physical Exam  Constitutional: She is oriented to person, place, and time. She appears well-developed and  well-nourished. No distress.  HENT:  Head: Normocephalic and atraumatic.  Right Ear: External ear normal.  Left Ear: External ear normal.  Mouth/Throat: Oropharynx is clear and moist.  Merocel pack in left nasal passage, no active bleeding.  Eyes: Conjunctivae and EOM are normal. Pupils are equal, round, and reactive to light.  Neck: Normal range of motion. Neck supple.  Cardiovascular: Normal rate.   Respiratory: Effort normal.  GI:  Did not examine.  Genitourinary:  Did not examine.  Musculoskeletal: Normal range of motion.  Neurological: She is alert and oriented to person, place, and time. No cranial nerve deficit.  Skin: Skin is warm and dry.  Psychiatric: She has a normal mood and affect. Her behavior is normal. Judgment and thought content normal.    Assessment/Plan: Left epistaxis, complicated history. I spent about 30 minutes talking to the patient and her daughter.  We discussed options of transferring to [facility] for potential additional surgery or trying to manage this episode with packing and holding Coumadin.  I will contact her cardiologist to discuss whether we can hold Coumadin short-term and will also discuss whether Coumadin can be stopped long-term.  I have no doubt that Coumadin therapy is playing a significant role in her continued epistaxis problems and management has been quite difficult.  After that conversation, she will likely be able to be discharged to follow-up Tuesday for pack removal.  Surabhi Gadea 02/10/2013, 11:20 AM

## 2013-02-12 ENCOUNTER — Telehealth: Payer: Self-pay | Admitting: Cardiovascular Disease

## 2013-02-12 NOTE — Telephone Encounter (Signed)
New Prob   Pt was seen in ED for severe nosebleeds and was told by Dr Jenne Pane to follow up with Dr. Excell Seltzer on Wednesday. Pt was taken off coumadin due to bleeding, hasn't taken any since last Thursday night. Would like to speak to nurse regarding this.

## 2013-02-12 NOTE — Telephone Encounter (Signed)
New problem   Pt was seen in ED for nosebleed and was told by Dr Jenne Pane she need to see Dr Excell Seltzer on wed. Pt don't want to see PA. Please call pt concerning his matter.

## 2013-02-12 NOTE — Telephone Encounter (Signed)
Spoke with patient. She's had recurrent and severe epistaxis on Coumadin. She's maintaining sinus rhythm on Tikosyn. I don't think we have any other option but to stop warfarin at this point. Pt understands instructions and agrees. She sees Dr Jenne Pane in follow-up tomorrow.  Tonny Bollman 02/12/2013 6:01 PM

## 2013-02-12 NOTE — Telephone Encounter (Signed)
Per Dr Excell Seltzer the pt should remain off of Coumadin at this time.  Dr Excell Seltzer called and spoke with the pt by phone and she does not require an office visit at this time.  The pt will keep her current appointment in June with Dr Excell Seltzer.

## 2013-02-13 ENCOUNTER — Telehealth: Payer: Self-pay | Admitting: Cardiovascular Disease

## 2013-02-13 ENCOUNTER — Encounter: Payer: Self-pay | Admitting: *Deleted

## 2013-02-13 ENCOUNTER — Ambulatory Visit: Payer: Self-pay | Admitting: Cardiovascular Disease

## 2013-02-13 DIAGNOSIS — Z8679 Personal history of other diseases of the circulatory system: Secondary | ICD-10-CM

## 2013-02-13 DIAGNOSIS — I4891 Unspecified atrial fibrillation: Secondary | ICD-10-CM

## 2013-02-13 NOTE — Telephone Encounter (Signed)
Left message for pt to call back  °

## 2013-02-13 NOTE — Telephone Encounter (Signed)
The pt would like samples of Crestor per the coumadin clinic.  They have spoken with the pt about stopping Coumadin.

## 2013-02-13 NOTE — Progress Notes (Signed)
UR Completed.  [name] Jane [phone] 02/13/2013  

## 2013-02-13 NOTE — Telephone Encounter (Signed)
New problem   Pt want to know if the Coumadin Clinic can give her samples of Crestor 10. Please call pt concerning this matter.

## 2013-02-13 NOTE — Telephone Encounter (Signed)
This encounter was created in error - please disregard.

## 2013-02-23 NOTE — Telephone Encounter (Signed)
Per medication list this pt is not taking Crestor.  I think the pt was calling for her husband.  I will close this encounter as the pt has not called back.

## 2013-02-27 ENCOUNTER — Telehealth: Payer: Self-pay | Admitting: Cardiovascular Disease

## 2013-02-27 NOTE — Telephone Encounter (Signed)
Error

## 2013-03-26 ENCOUNTER — Other Ambulatory Visit: Payer: Self-pay | Admitting: Pulmonary Disease

## 2013-04-06 ENCOUNTER — Other Ambulatory Visit: Payer: Self-pay | Admitting: *Deleted

## 2013-04-06 DIAGNOSIS — I7101 Dissection of thoracic aorta: Secondary | ICD-10-CM

## 2013-04-11 ENCOUNTER — Encounter: Payer: Self-pay | Admitting: Pulmonary Disease

## 2013-04-11 ENCOUNTER — Ambulatory Visit (INDEPENDENT_AMBULATORY_CARE_PROVIDER_SITE_OTHER): Payer: Medicare Other | Admitting: Pulmonary Disease

## 2013-04-11 ENCOUNTER — Other Ambulatory Visit: Payer: Self-pay | Admitting: *Deleted

## 2013-04-11 ENCOUNTER — Other Ambulatory Visit (INDEPENDENT_AMBULATORY_CARE_PROVIDER_SITE_OTHER): Payer: Medicare Other

## 2013-04-11 VITALS — BP 108/80 | HR 62 | Temp 97.0°F | Ht 67.0 in | Wt 160.4 lb

## 2013-04-11 DIAGNOSIS — M545 Low back pain: Secondary | ICD-10-CM

## 2013-04-11 DIAGNOSIS — I10 Essential (primary) hypertension: Secondary | ICD-10-CM

## 2013-04-11 DIAGNOSIS — D62 Acute posthemorrhagic anemia: Secondary | ICD-10-CM

## 2013-04-11 DIAGNOSIS — I7101 Dissection of thoracic aorta: Secondary | ICD-10-CM

## 2013-04-11 DIAGNOSIS — G609 Hereditary and idiopathic neuropathy, unspecified: Secondary | ICD-10-CM

## 2013-04-11 DIAGNOSIS — F341 Dysthymic disorder: Secondary | ICD-10-CM

## 2013-04-11 DIAGNOSIS — E7800 Pure hypercholesterolemia, unspecified: Secondary | ICD-10-CM

## 2013-04-11 DIAGNOSIS — I2510 Atherosclerotic heart disease of native coronary artery without angina pectoris: Secondary | ICD-10-CM

## 2013-04-11 DIAGNOSIS — I4891 Unspecified atrial fibrillation: Secondary | ICD-10-CM

## 2013-04-11 DIAGNOSIS — R7309 Other abnormal glucose: Secondary | ICD-10-CM

## 2013-04-11 DIAGNOSIS — R040 Epistaxis: Secondary | ICD-10-CM

## 2013-04-11 DIAGNOSIS — I872 Venous insufficiency (chronic) (peripheral): Secondary | ICD-10-CM

## 2013-04-11 DIAGNOSIS — I5033 Acute on chronic diastolic (congestive) heart failure: Secondary | ICD-10-CM

## 2013-04-11 DIAGNOSIS — IMO0001 Reserved for inherently not codable concepts without codable children: Secondary | ICD-10-CM

## 2013-04-11 DIAGNOSIS — M1990 Unspecified osteoarthritis, unspecified site: Secondary | ICD-10-CM

## 2013-04-11 LAB — CBC WITH DIFFERENTIAL/PLATELET
Basophils Absolute: 0 10*3/uL (ref 0.0–0.1)
Eosinophils Absolute: 0.1 10*3/uL (ref 0.0–0.7)
Lymphocytes Relative: 20.9 % (ref 12.0–46.0)
Lymphs Abs: 1.2 10*3/uL (ref 0.7–4.0)
MCHC: 33.4 g/dL (ref 30.0–36.0)
Monocytes Relative: 6.7 % (ref 3.0–12.0)
Neutro Abs: 4 10*3/uL (ref 1.4–7.7)
Platelets: 175 10*3/uL (ref 150.0–400.0)
RDW: 13.4 % (ref 11.5–14.6)

## 2013-04-11 LAB — HEMOGLOBIN A1C: Hgb A1c MFr Bld: 5.9 % (ref 4.6–6.5)

## 2013-04-11 LAB — BASIC METABOLIC PANEL
BUN: 31 mg/dL — ABNORMAL HIGH (ref 6–23)
CO2: 34 mEq/L — ABNORMAL HIGH (ref 19–32)
Calcium: 9.9 mg/dL (ref 8.4–10.5)
GFR: 60.13 mL/min (ref >60.00)
Glucose, Bld: 102 mg/dL — ABNORMAL HIGH (ref 70–99)

## 2013-04-11 MED ORDER — HYDROCODONE-ACETAMINOPHEN 5-325 MG PO TABS: 1.0000 | ORAL_TABLET | Freq: Three times a day (TID) | ORAL | Status: DC | PRN

## 2013-04-11 MED ORDER — CEPHALEXIN 500 MG PO CAPS: 500.0000 mg | ORAL_CAPSULE | Freq: Three times a day (TID) | ORAL | Status: DC

## 2013-04-11 NOTE — Patient Instructions (Addendum)
Today we updated your med list in our EPIC system...    Continue your current medications the same...  We refilled your Vicodin per request...  We decided to treat a poss sinus infection w/ KEFLEX 500mg  one tab three times daily til gone...  Be sure to use the Saline & MUCINEX 600mg - 2 tabs twice daily w/ lots of fluids...  Today we did your follow up labs...    We will contact you w/ the results when available...   Continue the nasal saline lavage & follow up w/ DrBates as needed...  Let's plan a follow up visit in 28mo, sooner if needed for problems.Marland KitchenMarland Kitchen

## 2013-04-11 NOTE — Progress Notes (Signed)
Subjective:    Patient ID: [name], female    DOB: [birth year], 78 y.o.   MRN: [id number]  HPI 78 y/o WF here for a follow up visit... she has multiple medical problems as noted below...     Followed by DrCooper for Cards- hx ASD repair 1982 & dilated AoRoot;  HBP & PAF;  then Aortic dissection w/ emergency surg 7/11 by DrOwen w/ CABG x2 and miraculous recovery...   Followed by DrDBrodie for GI- hx dysphagia & esoph strictures dilated, also hx candida esoph... also prob w/ constip part related to rectocele...   Followed by [facility] & ENT at [facility] for severe epistaxis requiring mult surg in 2013 (see below)...  ~  September 04, 2010:  She developed sudden left sided back & chest pain 06/04/10 w/ signs of ischemia & taken to cath lab w/ AoDissection found along w/ Lmain disease- 11hr emergency operation by DrOwen w/ repair of aortic dissection & CABG x2 (SVG to LAD, & SVG to obtuse marg branch of Circ) was required to get her off the bypass machine... she made a miraculous recovery- followed closely by DrOwen & DrCooper w/ meds as below... getting stonger w/ home health rehab, etc... some recent incr trouble w/ fluid retention & they have adjusted diuretics... XRays & labs reviewed... I have offered assist in any way needed- she requests refill Rxs to [facility].  ~  September 07, 2011:  1 year ROV & she has had a lot going on >> long prob list & multisystem disease w/ mult specialists involved in her care...    Abn CXR> s/p AscAo dissection repair & bilat apical schwannomas (see CXR & CTA report); O2sat= 91% on RA & advised incr exercise at home...    HBP> on Metoprolol50Bid, Lasix80Bid, Zaroxyln2.5 2d/wk, K20Tid- ;  BP= 114/68 & labs showed K= 3.2; advised incr K20 to 2Bid everyday...    CARDIAC> followed by DrCooper & DrOwens w/ long complic cardiac hx> ASD secundum repair at [facility], CAD, PAF, Ac on Chr Diastolic CHF, & 7/11 Asc ThorAo Dissection w/ severe AI & Lmain lesion==> s/p  repair of dissecting aneurysm & CABGx2 [id number]... She knows to take Amox before dental work.    PAF> on ASA81 + Coumadin in CC, off Amio, followed by DrCooper for Cards    CHOL> on Crestor5 & her FLP looks good (see below)...    DM> borderline DM on diet alone & labs look good; continue diet + exercise...    Hx esophagitis & stricture> followed by DrDBrodie on Prilosec20Bid; last EGD 3/11ndida (Rx'd); denies heartburn, pain, dysphagia, etc...    IBS w/ constip & Rectocele> also followed by DrGottsegen; on stool softeners but she has trouble w/ evacuation & they are aware...    Left flank discomfort> she also sees Urology (prev DrKimbrough) & has f/u appt pending...    DJD/ LBP/ FM> she is actually c/o "corns" on her feet that make walking difficult she says; she will try OTC pads & if not improved she will call Podiatry...    Neuropathy> followed by Autumn Patty on Neurontin300Bid & Tramadol Prn...    Anxiety/ Depression> on Zoloft50, Tranxene7.5 Prn (ave~1/d she says), & [id number];   ~  March 07, 2012:  30mo ROV & [name] has been thru a lot since last OV>>    Hx Aortic replacement & AFib prev on Coumadin> Feb2013 developed severe epistaxis:    Treated for bilat severe epistaxis 12/27/11 w/ bilat endoscopic sphenopalatine ligation & packing by Talmage Coin  at [facility]; packing removed 2/18 by DrShoemaker in his office...    Anemic due to acute blood loss> prev Hg= 10-11 range & was 7.7 when Tx to [facility]...    Re-adm 2/28 - 01/14/12 for recurrent bleeding from right nares- packed & attempted embolization was unsuccessful; transferred to [facility] by [facility] for further surg...    She had further surg by ENT at [facility] but we don't have any of these records or f/u note by ENT in [place]... She has had mult f/u visits w/ DrCooper for Cards> back on Coumadin for her AFib (intol to Amio & Coumadin carefully monitored by CC), right heart failure w/ severe TR & no surg option, extremely fatigued/ exhaused/ doing very poorly  overall & she feels she has not recovered from the ENT surg/ nose bleeding episodes... CXR 4/13 showed chr severe cardiomeg, prev CABG, chr pleural blunting but no acute effusions, post-op right axillary changes, NAD... LABS 4/13:  Chems- ok w/ K=4.0 BUN=42 Creat=1.1 BS=122 A1c=7.1;  CBC- Hg=9.1 Fe=18 (4%);  TSH=4.49;  B12=849;  PROTIME= WAY TOO THIN & referred to CC- stat...  ~  Apr 05, 2012:  41mo ROV> [name] remains very weak & is having difficulty at home- fell in [place] yest when husb was out, couldn't stand, crawled to cellphone & husb ret to help her up, bruised right lat ribs & left hip area (XRays today are neg);  She notes her walking is worse since her last surg & we discussed the need for PHYSICAL THERAPY- walking, strengthening, balance;  She has Neuro f/u w/ DrLove pending;  "My goal is to get in my car and GO and DO!" she says...    She saw DrCooper for Cards f/u earlier this month & there doesn't look like there is much else he can do;  F/u CTA 5/13 showed stable prox Ao graft- see extensive report by DrYamagata (reviewed)...    She saw DrFontaine for GYN f/u- c/o labial swelling & tenderness- infected seb cyst, drained, Rx Doxy Bid, & improved... CXR 5/13 showed stable cardiomegaly & median sternotomy, atherosclerotic calcif in AO, clear lungs, osteopenia, NAD... Right RIB FILMS are neg for any fractures... Left HIP FILM is neg for fx, pos for osteopenia... LABS 5/13:  Chems- ok x HCO3=37;  CBC- improved w/ Hg=11.1 Fe=132 (38%)  ~  June 12, 2012:  273mo ROV & post hosp check> [name] is now [facility] having been placed on Tikosyn 6/13 Hosp for AFib/ CHF/ weakness; she converted to NSR, Lasix changed to Demadex/ Aldactone & Cards is watching very carefully;  She has been walking >80mi daily again;  She is also back on Coumadin via CC w/ only minor epistaxis noted...  She saw DrCooper 6/13 post-hosp check> doing well onTikosyn 250mg  Bid, Metoprolol 12.5mg  bid, Demadex 20mg -3tabs daily,  Aldactone25mg /d, K20/d, and Coumadin via clinic...    We reviewed prob list, meds, xrays and labs> see below for updates >> CXR 6/13 showed s/p median sternotomy, cardiomeg, tort Ao, left base scarring/ atx, NAD... LABS 6/13 Hosp reviewed...  ~  October 09, 2012:  73mo ROV & [name] is stable but still notes some upper airway congestion, sore throat, hoarseness, & some blood from right nares; we decided to treat w/ Augmentin, Flonase, Mucinex, Fluids, Nasal saline, etc...   ~  Apr 11, 2013:  74mo ROV & [name] had recurrent epistaxis 3/14> Hosp overnight at [facility] & DrCooper decided to STOP her Coumadin (holding NSR w/ the Tikosyn) and she improved  but has persistent nasal congestion, bloody drainage, etc; review of EPIC records also indicates +for MRSA on screening; We decided to Rx w/ MUCINEX600mg -2Bid, Fluids, NASAL SALINE Q1h, & 7d course of KEFLEX 500mg Tid to see if this improves...     Recurrent Epistaxis>  As above... She was [facility] for another severe epistaxis episode 3/14; still has bloody crusty drainage in nose- Rx w/ SALINE, Mucinex, 7d course of KEFLEX (hx +MRSA noted),,,    Abn CXR> s/p AscAo dissection repair & bilat apical schwannomas (see CXR & CTA report); CXR 6/13 showed cardiomeg, median sternotomy, tort Ao, basilar scarring/ Atx; O2sat= 98% on RA & advised incr exercise at home...    HBP> on Metoprolol25-1/2Bid, Demadex20-3/d, Aldactone25, K20;  BP= 108/80 & labs showed K= 4.1    CARDIAC> followed by DrCooper & DrOwens w/ long complic cardiac hx> ASD secundum repair at [facility] 1982, CAD, PAF, Ac on Chr Diastolic CHF, & 7/11 Asc ThorAo Dissection w/ severe AI & Lmain lesion==> s/p repair of dissecting aneurysm & CABGx2 [id number]; She knows to take Amox before dental work; she saw DrCooper 2/14 & again 3/14 in [place] for Epistaxis where they decided to STOP the Coumadin due to the bleeding & the fact that she was maintaining NSR on Tikosyn...    PAF> on Tikosyn250Bid + off Coumadin now;   followed by DrCooper for Cards & holding NSR w/o CP, palpit, etc...    CHOL> on diet alone now & off prev Crestor5; last FLP was 2012 & it needs repeat on diet alone...    DM> borderline DM on diet alone & labs 5/14 showed BS= 102, A1c=5.9    Hx esophagitis & stricture> followed by DrDBrodie on Prilosec20prn; last EGD 3/11 showed candida (Rx'd); denies heartburn, pain, dysphagia, etc...    IBS w/ constip & Rectocele> also followed by DrGottsegen; on stool softeners but she has trouble w/ evacuation & they are aware...    Left flank discomfort> likely musculoskeletal- take tramadol/ Vicodin; she also sees Urology & was checked by DrDahlstedt & PA 3/14> Urine was clear...    DJD/ LBP/ FM> she is stable on Tramadol & Vicodin as needed for pain; mult prev MRI's w/ paraapinal meningoceles, hemangiomas, perineural cysts- checked by drStern 2/14 & felt to be benign & incidental no further eval warranted...    Neuropathy> followed by Autumn Patty (last seen 11/13) on Neurontin300-2Qhs & Tramadol Prn...    Anxiety/ Depression> on Zoloft50, Tranxene7.5 Prn (ave~1/d she says); they are under incr stress w/ son Lynden Ang) & daugh (divorce)... We reviewed prob list, meds, xrays and labs> see below for updates >>  LABS 5/14:  Chems- wnl w/ BS=102 A1c=5.9 Cr=1.0;  CBC- ok w/ Hg=12.8           Problem List:  SEVERE EPISTAXIS >> see above & managed by DrBates et al + ENT at [facility]=> Dr Eliezer Lofts treated w/ max med Rx w/ Ab for 3wks, nasal saline spray Tid, etc; f/u CT sinuses was improved, perforation in nasal septum noted & they offered surg repair but she is holding off...  OBSTRUCTIVE SLEEP APNEA (ICD-327.23) - sleep study 9/06 by Breck Coons showed RDI=25/hr during REM w/ desat to 77%...  ABN CXR w/ left superior mediastinal opacity ?etiology- no change back to 2006 films= benign... ~  CXR 2/11 showed cardiomeg, ectatic Ao, left superior mediast soft tissue prom w/o change (?ectasia of great vessels or benign mass). ~   CXR 9/11 s/p median sternotomy, dissection repair,  & CABG- cardiomeg, bilat effusions &  basilar atx, no ch in left superior lesion... ~  CXR 4/12 showed med sternotomy, cardiomeg, hyerinflation/ scarring, some vasc congestion... ~  CTAngio 5/12 showed stable asc ao dissection repair, rounded soft tissues masses both apicies L>R are prob neural tumors (schwannomas)... ~  CXR 4/13 & 5/13 showed chr severe cardiomeg, prev CABG, chr pleural blunting but no acute effusions, post-op right axillary changes, NAD.Marland Kitchen. ~  CTAngio 5/13 showed stable appearance in the chest w/ mult findings (SEE REPORT); stable appearance in the abd as well (SEE REPORT)...  HYPERTENSION (ICD-401.9) - controlled on METOPROLOL 50mg Bid, + LASIX/ ZAROXYLN/ KCl...  ~  10/12:  BP=110/70 and she denies HA, visual changes, CP, palipit, syncope... she is fatigued, SOB/ DOE, & +edema... getting PT/ rehab therapy & improving slowly... ~  4/13:  BP= 100/70 & she is very weak s/p epistaxis episodes 2/13 & 3/13 w/ surg here & at [facility]... ~  5/13:  BP= 100/70 & she is very weak... ~  7/13:  BP= 120/74 & she is feeling much better in NSR on Tikosyn... ~  11/13: BP= 102/60 & she denies CP, palpit, ch in SOB, edema, etc... ~  5/14: on Metoprolol25-1/2Bid, Demadex20-3/d, Aldactone25, K20;  BP= 108/80 & labs showed K= 4.1  ATRIAL FIBRILLATION (ICD-427.31) - on COUMADIN followed in the CC and very carefully monitored due to her severe epistaxis; managed by DrCooper for Cards & she is intol to Amiodarone...  CORONARY ARTERY DISEASE (ICD-414.00) - off ASA due to severe epistaxis episodes.. ACUTE ON CHRONIC DIASTOLIC HEART FAILURE (ICD-428.33) - prev on Lasix, Zaroxylyn, & KCl; Hosp 6/13 for Tikosyn Rx & much improved in NSR on this & Demadex/ Aldactone/ KCl/ Coumadin... 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 Hx of ATRIAL SEPTAL DEFECT (ICD-745.5) - s/p ASD secundum repaired at [facility] in [facility]... long-time pt  of DrJoeLeBauer and now DrCooper> ~  Cardiolite 5/06 showed no ischemia & EF=65%...  ~  2DEcho 12/07 showed norm LVF, EF=55%, no regional wall motion abn, & RA/RV= mod dil... ~  Cardiac MRI 3/09 showed mod Ao root enlargement 4.3cm, norm AoV/ Arch/ Desc Ao, mod RV & biatrial enlargement from the prev ASD repair (no resid leak), mod PA enlargement, norm LV w/ EF=59%... ~  [facility] 9/09 w/ MI ruled-out, atyp CP (prob esoph spasm) & Myoview was neg- no scar or ischemia, EF= 77%... ~  2DEcho 9/10 showed sl incr LV wall thikness c/w mild LVH, norm wall motion, EF= 55-60%, gr2 DD, mild AI, mod dil LA/RA, mod-severe TR, mild pulmHTN... ~  Emergency hosp 7/11 w/ Aortic dissection, severe AI, severe TR & cath w/ 40%Lmain lesion- s/p repair & required CABG x2 as well... ~  DrOwen does CTAngiograms every 62mo... ~  [facility] 4/12 by Cards for CHF from severe TR & right heart disease> SEE 2DEcho & Right heart cath data> reviewed... ~  She saw DrCooper 10/12> felt to be reasonably stable, holding NSR, on Coumadin via CC, & f/u 2DEcho ordered & pending... ~  Labs 10/12 showed K=3.2 on K20Tid+4MTh; TCO2=37, BUN=28, Creat=0.8; pt instructed to incr K20 to [facility] everyday... ~  Serial labs avail in the results section... Labs 5/13 showed BUN=21, Creat=0.8, K=3.9 ~  HOSP 6/13 by Cards & much improved in NSR on TIKOSYN 250mg Bid, DEMADEX20mg - 3/d, ALDACTONE25mg /d, KCl 20/d; and COUMADIN via CC...  VENOUS INSUFFICIENCY (ICD-459.81) & EDEMA (ICD-782.3) - she has chr VI changes, superficial VV, and incr edema since surg 7/11... ~  3/09 ABI's done and were  WNL.Marland KitchenMarland Kitchen  HYPERCHOLESTEROLEMIA (ICD-272.0) - on CRESTOR 5mg Qod & she wants to STOP for awhile; she has blamed Zetia for cough in the past... ~  FLP 7/08 showing TChol 143, TG 173, HDL 32, LDL 76. ~  FLP 4/09 showed TChol 172, Tg 161, HDL 38, LDL 102 ~  FLP 7/09 showed TChol 167. Tg 148, HDL 40, LDL 98... rec- continue same. ~  FLP 1/10 showed TChol 177, TG 85, HDL 43, LDL 117 ~   FLP 2/11 showed TChol 160, TG 83, HDL 50, LDL 94 ~  FLP 10/12 on Cres5 showed TChol 150, TG 81, HDL 52, LDL 82 ~  4/13:  She wants to STOP the Cres5Qod for awhile since she is so weak... ~  5/14:  She needs to ret FASTING for FLP on diet alone...  DIABETES MELLITUS, BORDERLINE (ICD-790.29) - on diet Rx alone... ~  Hx borderline BS w/ FBS=119 in 7/08 & HgA1c=6.1 on diet alone. ~  labs 4/09 showed BS= 120, HgA1c= 6.0.Marland KitchenMarland Kitchen continue diet rx. ~  labs 7/09 showed BS= 132 ~  labs 1/10 showed BS= 120, A1c= 6.1 ~  labs 2/11 showed BS= 106 ~  Labs 10/12 showed BS= 121, A1c= 6.3.Marland KitchenMarland Kitchen Ok on diet Rx. ~  Labs 4/13 showed BS= 122, A1c= 7.1 on diet alone & we reviewed low carb diet restriction... ~  11/13:  No DM retinopathy per DrStoneburner... ~  5/14: borderline DM on diet alone & labs 5/14 showed BS= 102, A1c=5.9  Hx of ESOPHAGEAL STRICTURE (ICD-530.3) - last EGD 6/04 by DrDBrodie w/ esophagitis, hx gastritis... 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 treated w/ PRILOSEC 20mg Bid... ~  [facility] Sep09 w/ atyp CP felt to be esoph spasm... GI f/u DrDBrodie w/ EGD Sep09= candida esoph, nonobstructing esoph stricture dilated. ~  2/11: presents w/ incr dysphagia & f/u GI w/ Ba Esophagram-mild stricture;  EGD 3/11 showed candida esophagitis (Rx'd);  Sonar- no gallstones, WNL.Marland KitchenMarland Kitchen  IRRITABLE BOWEL SYNDROME, HX OF (ICD-V12.79) - colonoscopy was 6/04 by DrDBrodie= neg without polyps etc... f/u 9/09 was negative- no recurrent polyps... f/u planned 9/14... ~  she reports trouble w/ constip and hx recurrent rectocele- initial surg 1987, now sees DrGottsegen for GYN & may need additional surg... ~  She saw DrDBrodie 9/12> sm vol intermit painless rectal bleeding from anal fissures; rectocele, constip, difficulty evacuating; on stool softeners & Nupercainal oint.  UROLOGY> followed by DrKimbrough> Hx microhematuria, renal cyst, duplication & dilatation on the left, left sided  pain off & on...  DEGENERATIVE JOINT DISEASE (ICD-715.90) >> on TRAMADOL 50mg  prn  BACK PAIN, LUMBAR (ICD-724.2) >> she also takes MVI, Vit D...  FIBROMYALGIA (ICD-729.1)  PERIPHERAL NEUROPATHY (ICD-356.9) - sl worse per pt and DrLove follows... she takes NEURONTIN 300mg Bid and refuses other meds... 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  ?TIA - hosp 11/07 by Autumn Patty w/ ?TIA vs sleep paralysis episode... MRIBrain w/ sm vessel dis and atherosclerotic changes... she still complains of "weak spells, my whole body gets weak in the afternoons"... these episodes last ~11min, resolve spont w/ rest or ?better after eating... she is convinced that the sleep paralysis episodes were caused by the Requip med...  ANXIETY DEPRESSION (ICD-300.4) - she states that "my nerves are shot" w/ stress from son's alcoholism & divorce, plus her husb's illness etc... started ZOLOFT 50mg /d 7/10 & improved...  ANEMIA >>  due to Anemia of chronic dis & blood loss from epistaxis 2/13 & 3/13... ~  Labs from 2/13 showed baseline Hg= 10-11 range & bleed down to Hg=7.7 on Tx to [facility]... ~  Labs here 4/13 showed Hg= 9.1, Fe= 18 (4%sat); started on FeSO4 325mg  Bid (w/ VitC 500mg ) but she only took once daily... ~  Labs 5/13 showed Hg= 11.1, Fe= 132 (38%)... On Fe one daily... ~  Labs 5/14 showed Hg= 12.8  Health Maintenance: ~  GYN= DrGottsegen w/ Mammograms at [facility] (last 4/10 w/ ultrasound f/u)... Gyn does BMD's and she reports it was normal several yrs ago, not on calcium, MVI, etc... ~  Immunizations:  she reports 2010 Flu vaccine & Pneumonia shot in 2010... cna't recall last Tetanus.   Past Medical History  Diagnosis Date  . OSA (obstructive sleep apnea)   . HTN (hypertension)   . Atrial fibrillation     paroxysmal; on coumadin  . CAD (coronary artery disease)     a. cath 7/11: LM 40%, mild plaque disease in CFX, LAD, and  RCA;  b. 2011 CABG with S-LAD and S-OM done at time of Aortic dissection repair  . Chronic diastolic CHF (congestive heart failure)     a. 08/2011 Echo: EF 55-60%, PASP  . Dissection of aorta, thoracic     a. Type A; s/p repair 7/11 with aortic root repair and CABG x 2   . ASD (atrial septal defect)     a. s/p repair 1982 at [facility]  . Right heart failure     a. 2/2 TR and RV dysfxn;  b. echo 4/12: EF 60%, mild LVH, mild AI, mild MR, mod LAE, mod RVE with mod dec. RVSF, mod RAE, mod to severe TR, PASP 58;  c. right heart cath 4/12:  RA mean 8, RV 46/1 with mean 6, PA 45/13 with mean 26, PCWP mean 14, CO 3.68, CI 2.1 (no sig pulmon HTN)  . GERD (gastroesophageal reflux disease)   . IBS (irritable bowel syndrome)   . Esophageal stricture   . Colonic polyp   . Peripheral neuropathy   . Back pain   . Fibromyalgia   . Anxiety   . HLD (hyperlipidemia)   . Borderline diabetes   . History of TIAs   . DJD (degenerative joint disease)   . Normocytic anemia   . Thrombocytopenia   . Macular degeneration   . Depression   . Esophageal dysmotilities     Past Surgical History  Procedure Laterality Date  . Asd repair  1982    [facility]  . Rotator cuff repair  2007    right  . Cardioversion      x 3  . Tubal ligation    . Appendectomy    . Vaginal hysterectomy  1987    A/P Repair With Cystocele and rectocele repair  . Tonsillectomy    . Oophorectomy  1994    BSO  . Pelvic laparoscopy  1994  . Emergency redo median sternotomy for hemiarch repair of acute type a aortic  dissection  06/05/2010  . Nasal hemorrhage control  12/27/2011    Procedure: EPISTAXIS CONTROL;  Surgeon: Melvenia Beam, MD;  Location: [facility] OR;  Service: ENT;  Laterality: N/A;    Outpatient Encounter Prescriptions as of 04/11/2013  Medication Sig Dispense Refill  . cholecalciferol (VITAMIN D) 1000 UNITS tablet Take 1,000 Units by mouth daily.       . clorazepate (TRANXENE) 7.5 MG tablet TAKE 1 TABLET  BY MOUTH 3 TIMES A  DAY AS NEEDED FOR NERVES  90 tablet  5  . dofetilide (TIKOSYN) 250 MCG capsule Take 1 capsule (250 mcg total) by mouth 2 (two) times daily.      Marland Kitchen gabapentin (NEURONTIN) 300 MG capsule TAKE 2 CAPSULES BY MOUTH AT BEDTIME  60 capsule  4  . glucosamine-chondroitin 500-400 MG tablet Take 1 tablet by mouth 2 (two) times daily.      . metoprolol (LOPRESSOR) 25 MG tablet Take 0.5 tablets (12.5 mg total) by mouth 2 (two) times daily.  33 tablet  11  . omeprazole (PRILOSEC) 20 MG capsule TAKE 1 CAPSULE BY MOUTH  TWICE A DAY 30 MINUTES BEFORE MEALS  60 capsule  6  . potassium chloride SA (K-DUR,KLOR-CON) 20 MEQ tablet Take 20 mEq by mouth daily.   1 tablet  0  . sertraline (ZOLOFT) 50 MG tablet TAKE 1 TABLET BY MOUTH EVERY MORNING  30 tablet  6  . spironolactone (ALDACTONE) 25 MG tablet Take 1 tablet (25 mg total) by mouth daily.  30 tablet  11  . torsemide (DEMADEX) 20 MG tablet Take 3 tablets (60 mg total) by mouth daily.  90 tablet  11  . traMADol (ULTRAM) 50 MG tablet TAKE 1 TABLET (50 MG TOTAL) BY MOUTH 3 (THREE) TIMES DAILY AS NEEDED. FOR PAIN  90 tablet  3  . vitamin C (ASCORBIC ACID) 500 MG tablet Take 500 mg by mouth daily.      . cephALEXin (KEFLEX) 500 MG capsule Take 1 capsule (500 mg total) by mouth 3 (three) times daily.  21 capsule  0  . HYDROcodone-acetaminophen (NORCO/VICODIN) 5-325 MG per tablet Take 1 tablet by mouth 3 (three) times daily as needed for pain.  90 tablet  5   No facility-administered encounter medications on file as of 04/11/2013.    Allergies  Allergen Reactions  . Amiodarone     Severe side effects per Pt--  . Atorvastatin     REACTION: muscle pain  . Codeine     REACTION: itching  . Crestor (Rosuvastatin) Other (See Comments)    Muscle weakness  . Erythromycin Other (See Comments)    unknown  . Ezetimibe     REACTION: INTOL to Zetia w/ cough  . Meperidine Hcl Other (See Comments)    REACTION: dizziness  . Morphine Other (See Comments)    Pt states it  "makes her crazy"  . Neomycin-Bacitracin Zn-Polymyx Other (See Comments)    blisters  . Ropinirole Hydrochloride     REACTION: INTOL to Requip w/ sleep paralysis  . Simvastatin     REACTION: unable to walk--muscle pain    Current Medications, Allergies, Past Medical History, Past Surgical History, Family History, and Social History were reviewed in [employer] record.    Review of Systems         See HPI - all other systems neg except as noted...  The patient complains of decreased hearing, dyspnea on exertion, and peripheral edema.  The patient denies anorexia, fever, weight loss, weight gain, vision loss, hoarseness, chest pain, syncope, prolonged cough, headaches, hemoptysis, abdominal pain, melena, hematochezia, severe indigestion/heartburn, hematuria, incontinence, muscle weakness, suspicious skin lesions, transient blindness, difficulty walking, depression, unusual weight change, abnormal bleeding, enlarged lymph nodes, and angioedema.    Objective:   Physical Exam    WD, Thin, 78 y/o WF in NAD... GENERAL:  Alert & oriented; pleasant & cooperative... HEENT:  [place]/AT, EOM-wnl, PERRLA, Fundi-benign, EACs-clear, TMs-wnl, NOSE-clear,  THROAT-clear & wnl... NECK:  Supple w/ fairROM; no JVD; normal carotid impulses w/o bruits; no thyromegaly or nodules palpated; no lymphadenopathy. CHEST:  Median sterotomy scar, decr BS at bases w/ few bibasilar rales... HEART:  Regular Rhythm;  gr 1/6 sys murmur, without rubs or gallops detected... s/p repair ASD & Ao dissection... ABDOMEN:  Soft & nontender; normal bowel sounds; no organomegaly or masses detected. EXT: without deformities, mild arthritic changes; no varicose veins/ +venous insuffic/ 3+ edema. NEURO:  CN's intact;  peripheral neuropathy without focal neuro deficits on exam... DERM:  No lesions noted; no rash etc...  RADIOLOGY DATA:  Reviewed in the EPIC EMR & discussed w/ the patient...  LABORATORY DATA:   Reviewed in the EPIC EMR & discussed w/ the patient...   Assessment & Plan:    Epistaxsis>  She has persist inflammed nasal mucosa w/ old dried blood- needs trial Keflex/ Saline/ Mucinex and ret to ENT if not resolved...  Abn CXR> s/p AscAo dissection repair & bilat apical schwannomas (see CXR & CTA report); O2sat= 91% on RA & advised incr exercise at home...     HBP> on 3 meds as above + K20; better controlled & K=4.1 today...     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 She knows to take Amox before dental work;  known severe right heart failure w/ severe TR & no surg option> being managed by DrCooper for Cards...     AFib> much improved on Tikosyn, converted to NSR, & off blood thinners- feels much better, exercising etc...     CHOL> off Crestor on diet alone & needs f/u FLP on diet alone...     DM> Borderline DM on diet alone & labs look good- keep up the good work...     Hx esophagitis & stricture> followed by DrDBrodie on Prilosec20Bid; last EGD 3/11ndida (Rx'd); denies heartburn, pain, dysphagia, etc...  IBS w/ constip & Rectocele> also followed by DrGottsegen; on stool softeners but she has trouble w/ evacuation & they are aware...     Left flank discomfort> she also sees Urology (prev DrKimbrough) & has f/u appt pending...     DJD/ LBP/ FM> she is actually c/o "corns" on her feet that make walking difficult she says; she will try OTC pads & if not improved she will call Podiatry...     Neuropathy> followed by Autumn Patty on Neurontin300Bid & Tramadol Prn...     Anxiety/ Depression> on Zoloft50, Tranxene7.5 Prn (ave~1/d she says), & Ambien10Qhs...  ANEMIA>  Hg and Fe are remarkably better...   Patient's Medications  New Prescriptions   CEPHALEXIN (KEFLEX) 500 MG CAPSULE    Take  1 capsule (500 mg total) by mouth 3 (three) times daily.   HYDROCODONE-ACETAMINOPHEN (NORCO/VICODIN) 5-325 MG PER TABLET    Take 1 tablet by mouth 3 (three) times daily as needed for pain.  Previous Medications   CHOLECALCIFEROL (VITAMIN D) 1000 UNITS TABLET    Take 1,000 Units by mouth daily.    CLORAZEPATE (TRANXENE) 7.5 MG TABLET    TAKE 1 TABLET BY MOUTH 3 TIMES A DAY AS NEEDED FOR NERVES   DOFETILIDE (TIKOSYN) 250 MCG CAPSULE    Take 1 capsule (250 mcg total) by mouth 2 (two) times daily.   GABAPENTIN (NEURONTIN) 300 MG CAPSULE  TAKE 2 CAPSULES BY MOUTH AT BEDTIME   GLUCOSAMINE-CHONDROITIN 500-400 MG TABLET    Take 1 tablet by mouth 2 (two) times daily.   METOPROLOL (LOPRESSOR) 25 MG TABLET    Take 0.5 tablets (12.5 mg total) by mouth 2 (two) times daily.   OMEPRAZOLE (PRILOSEC) 20 MG CAPSULE    TAKE 1 CAPSULE BY MOUTH  TWICE A DAY 30 MINUTES BEFORE MEALS   POTASSIUM CHLORIDE SA (K-DUR,KLOR-CON) 20 MEQ TABLET    Take 20 mEq by mouth daily.    SERTRALINE (ZOLOFT) 50 MG TABLET    TAKE 1 TABLET BY MOUTH EVERY MORNING   SPIRONOLACTONE (ALDACTONE) 25 MG TABLET    Take 1 tablet (25 mg total) by mouth daily.   TORSEMIDE (DEMADEX) 20 MG TABLET    Take 3 tablets (60 mg total) by mouth daily.   TRAMADOL (ULTRAM) 50 MG TABLET    TAKE 1 TABLET (50 MG TOTAL) BY MOUTH 3 (THREE) TIMES DAILY AS NEEDED. FOR PAIN   VITAMIN C (ASCORBIC ACID) 500 MG TABLET    Take 500 mg by mouth daily.  Modified Medications   No medications on file  Discontinued Medications   No medications on file

## 2013-04-12 ENCOUNTER — Other Ambulatory Visit: Payer: Medicare Other

## 2013-04-12 ENCOUNTER — Other Ambulatory Visit: Payer: Self-pay

## 2013-04-12 DIAGNOSIS — Z1231 Encounter for screening mammogram for malignant neoplasm of breast: Secondary | ICD-10-CM

## 2013-04-12 MED ORDER — CLORAZEPATE DIPOTASSIUM 7.5 MG PO TABS: ORAL_TABLET | ORAL | Status: DC

## 2013-04-13 NOTE — Progress Notes (Signed)
Quick Note:  Spoke with pt and notified of results per Dr. Nadel. Pt verbalized understanding and denied any questions.  ______ 

## 2013-04-20 ENCOUNTER — Other Ambulatory Visit: Payer: Self-pay | Admitting: Pulmonary Disease

## 2013-04-20 ENCOUNTER — Other Ambulatory Visit: Payer: Self-pay | Admitting: Physician Assistant

## 2013-04-25 ENCOUNTER — Other Ambulatory Visit: Payer: Self-pay | Admitting: Pulmonary Disease

## 2013-05-04 ENCOUNTER — Ambulatory Visit (HOSPITAL_COMMUNITY): Payer: Medicare Other | Attending: Cardiology | Admitting: Radiology

## 2013-05-04 ENCOUNTER — Other Ambulatory Visit (INDEPENDENT_AMBULATORY_CARE_PROVIDER_SITE_OTHER): Payer: Medicare Other

## 2013-05-04 DIAGNOSIS — I4891 Unspecified atrial fibrillation: Secondary | ICD-10-CM

## 2013-05-04 DIAGNOSIS — I071 Rheumatic tricuspid insufficiency: Secondary | ICD-10-CM

## 2013-05-04 DIAGNOSIS — E7800 Pure hypercholesterolemia, unspecified: Secondary | ICD-10-CM | POA: Insufficient documentation

## 2013-05-04 DIAGNOSIS — I509 Heart failure, unspecified: Secondary | ICD-10-CM | POA: Insufficient documentation

## 2013-05-04 DIAGNOSIS — E119 Type 2 diabetes mellitus without complications: Secondary | ICD-10-CM | POA: Insufficient documentation

## 2013-05-04 DIAGNOSIS — I10 Essential (primary) hypertension: Secondary | ICD-10-CM | POA: Insufficient documentation

## 2013-05-04 DIAGNOSIS — I2510 Atherosclerotic heart disease of native coronary artery without angina pectoris: Secondary | ICD-10-CM | POA: Insufficient documentation

## 2013-05-04 DIAGNOSIS — I5022 Chronic systolic (congestive) heart failure: Secondary | ICD-10-CM

## 2013-05-04 LAB — BASIC METABOLIC PANEL
BUN: 31 mg/dL — ABNORMAL HIGH (ref 6–23)
Chloride: 98 mEq/L (ref 96–112)
Creatinine, Ser: 0.8 mg/dL (ref 0.4–1.2)
GFR: 70.14 mL/min (ref >60.00)
Potassium: 4.2 mEq/L (ref 3.5–5.1)

## 2013-05-04 LAB — MAGNESIUM: Magnesium: 2.3 mg/dL (ref 1.5–2.5)

## 2013-05-04 NOTE — Progress Notes (Signed)
Quick Note:  Preliminary report reviewed by triage nurse and sent to MD desk. ______ 

## 2013-05-04 NOTE — Progress Notes (Signed)
Echocardiogram performed.  

## 2013-05-07 ENCOUNTER — Other Ambulatory Visit (HOSPITAL_COMMUNITY): Payer: Self-pay | Admitting: Physician Assistant

## 2013-05-11 ENCOUNTER — Ambulatory Visit (INDEPENDENT_AMBULATORY_CARE_PROVIDER_SITE_OTHER): Payer: Medicare Other | Admitting: Cardiovascular Disease

## 2013-05-11 ENCOUNTER — Other Ambulatory Visit: Payer: Medicare Other

## 2013-05-11 ENCOUNTER — Encounter: Payer: Self-pay | Admitting: Cardiovascular Disease

## 2013-05-11 ENCOUNTER — Ambulatory Visit
Admission: RE | Admit: 2013-05-11 | Discharge: 2013-05-11 | Disposition: A | Discharge status: Home / Self Care | Payer: Medicare Other | Source: Ambulatory Visit | Attending: Thoracic Surgery (Cardiothoracic Vascular Surgery) | Admitting: Thoracic Surgery (Cardiothoracic Vascular Surgery)

## 2013-05-11 VITALS — BP 122/70 | HR 69 | Ht 67.0 in | Wt 160.0 lb

## 2013-05-11 DIAGNOSIS — I7101 Dissection of thoracic aorta: Secondary | ICD-10-CM

## 2013-05-11 DIAGNOSIS — I2510 Atherosclerotic heart disease of native coronary artery without angina pectoris: Secondary | ICD-10-CM

## 2013-05-11 DIAGNOSIS — I4891 Unspecified atrial fibrillation: Secondary | ICD-10-CM

## 2013-05-11 MED ORDER — IOHEXOL 350 MG/ML SOLN
80.0000 mL | Freq: Once | INTRAVENOUS | Status: AC | PRN
  Administered 2013-05-11: 80 mL via INTRAVENOUS

## 2013-05-11 NOTE — Patient Instructions (Addendum)
Your physician has recommended you make the following change in your medication: START Aspirin 81mg  take one by mouth daily  Your physician wants you to follow-up in: 4 MONTHS with Dr Excell Seltzer (October). You will receive a reminder letter in the mail two months in advance. If you don't receive a letter, please call our office to schedule the follow-up appointment.

## 2013-05-11 NOTE — Progress Notes (Signed)
HPI:  [name] returns for followup evaluation. She is a delightful 78 year old woman with a history of ASD status post remote surgical correction,Type A aortic dissection status post emergency surgical repair including CABG, atrial fibrillation, severe tricuspid regurgitation, and congestive heart failure. The patient has had problems with recurrent epistaxis requiring ENT surgery. She's had multiple emergency room evaluations and has required repeated surgeries. Her warfarin has been discontinued because of this.  She reports no changes in clinical symptoms from a cardiac perspective. She has mild dyspnea with exertion and this is unchanged. She denies chest pain or pressure. She denies orthopnea, PND, or leg swelling. She remains active and does much walking as she can. She complains of problems with her right knee and has noted some swelling there. She has a lot of pain in the knee but continues to walk. She also complains of low back pain. She's had no further epistaxis.   Outpatient Encounter Prescriptions as of 05/11/2013  Medication Sig Dispense Refill  . cephALEXin (KEFLEX) 500 MG capsule Take 1 capsule (500 mg total) by mouth 3 (three) times daily.  21 capsule  0  . cholecalciferol (VITAMIN D) 1000 UNITS tablet Take 1,000 Units by mouth daily.       . clorazepate (TRANXENE) 7.5 MG tablet TAKE 1 TABLET BY MOUTH 3 TIMES A DAY AS NEEDED FOR NERVES  90 tablet  5  . Docusate Calcium (STOOL SOFTENER PO) Take by mouth at bedtime.      . gabapentin (NEURONTIN) 300 MG capsule TAKE 2 CAPSULES BY MOUTH AT BEDTIME  60 capsule  4  . glucosamine-chondroitin 500-400 MG tablet Take 1 tablet by mouth 2 (two) times daily.      Marland Kitchen HYDROcodone-acetaminophen (NORCO/VICODIN) 5-325 MG per tablet Take 1 tablet by mouth 3 (three) times daily as needed for pain.  90 tablet  5  . Levofloxacin (LEVAQUIN PO) Take by mouth daily.      Marland Kitchen omeprazole (PRILOSEC) 20 MG capsule TAKE 1 CAPSULE BY MOUTH  TWICE A DAY 30 MINUTES  BEFORE MEALS  60 capsule  6  . OVER THE COUNTER MEDICATION 2 (two) times daily. pericolace      . Polyethylene Glycol 3350 (MIRALAX PO) Take by mouth at bedtime.      . potassium chloride SA (K-DUR,KLOR-CON) 20 MEQ tablet TAKE 1 TABLET (20 MEQ TOTAL) BY MOUTH DAILY.  30 tablet  6  . sertraline (ZOLOFT) 50 MG tablet TAKE 1 TABLET BY MOUTH EVERY MORNING  30 tablet  6  . spironolactone (ALDACTONE) 25 MG tablet TAKE 1 TABLET (25 MG TOTAL) BY MOUTH DAILY.  30 tablet  5  . TIKOSYN 250 MCG capsule TAKE ONE CAPSULE BY MOUTH EVERY 12 HOURS  60 capsule  11  . torsemide (DEMADEX) 20 MG tablet TAKE 3 TABLETS (60 MG TOTAL) BY MOUTH DAILY.  90 tablet  5  . traMADol (ULTRAM) 50 MG tablet TAKE 1 TABLET (50 MG TOTAL) BY MOUTH 3 (THREE) TIMES DAILY AS NEEDED. FOR PAIN  90 tablet  3  . vitamin C (ASCORBIC ACID) 500 MG tablet Take 500 mg by mouth daily.      . metoprolol (LOPRESSOR) 25 MG tablet Take 0.5 tablets (12.5 mg total) by mouth 2 (two) times daily.  33 tablet  11  . [DISCONTINUED] potassium chloride SA (K-DUR,KLOR-CON) 20 MEQ tablet Take 20 mEq by mouth daily.   1 tablet  0   No facility-administered encounter medications on file as of 05/11/2013.    Allergies  Allergen Reactions  . Amiodarone     Severe side effects per Pt--  . Atorvastatin     REACTION: muscle pain  . Codeine     REACTION: itching  . Crestor (Rosuvastatin) Other (See Comments)    Muscle weakness  . Erythromycin Other (See Comments)    unknown  . Ezetimibe     REACTION: INTOL to Zetia w/ cough  . Meperidine Hcl Other (See Comments)    REACTION: dizziness  . Morphine Other (See Comments)    Pt states it "makes her crazy"  . Neomycin-Bacitracin Zn-Polymyx Other (See Comments)    blisters  . Ropinirole Hydrochloride     REACTION: INTOL to Requip w/ sleep paralysis  . Simvastatin     REACTION: unable to walk--muscle pain    Past Medical History  Diagnosis Date  . OSA (obstructive sleep apnea)   . HTN (hypertension)   .  Atrial fibrillation     paroxysmal; on coumadin  . CAD (coronary artery disease)     a. cath 7/11: LM 40%, mild plaque disease in CFX, LAD, and RCA;  b. 2011 CABG with S-LAD and S-OM done at time of Aortic dissection repair  . Chronic diastolic CHF (congestive heart failure)     a. 08/2011 Echo: EF 55-60%, PASP  . Dissection of aorta, thoracic     a. Type A; s/p repair 7/11 with aortic root repair and CABG x 2   . ASD (atrial septal defect)     a. s/p repair [facility] at [facility]  . Right heart failure     a. 2/2 TR and RV dysfxn;  b. echo 4/12: EF 60%, mild LVH, mild AI, mild MR, mod LAE, mod RVE with mod dec. RVSF, mod RAE, mod to severe TR, PASP 58;  c. right heart cath 4/12:  RA mean 8, RV 46/1 with mean 6, PA 45/13 with mean 26, PCWP mean 14, CO 3.68, CI 2.1 (no sig pulmon HTN)  . GERD (gastroesophageal reflux disease)   . IBS (irritable bowel syndrome)   . Esophageal stricture   . Colonic polyp   . Peripheral neuropathy   . Back pain   . Fibromyalgia   . Anxiety   . HLD (hyperlipidemia)   . Borderline diabetes   . History of TIAs   . DJD (degenerative joint disease)   . Normocytic anemia   . Thrombocytopenia   . Macular degeneration   . Depression   . Esophageal dysmotilities    ROS: Negative except as per HPI  BP 122/70  Pulse 69  Ht 5\' 7"  (1.702 m)  Wt 160 lb (72.576 kg)  BMI 25.05 kg/m2  PHYSICAL EXAM: Pt is alert and oriented, NAD HEENT: normal Neck: JVP - modestly elevated with visible V waves, carotids 2+= without bruits Lungs: CTA bilaterally CV: Irregular with grade 2/6 holosystolic murmur at left lower sternal border Abd: soft, NT, Positive BS, no hepatomegaly Ext: no C/C/E, distal pulses intact and equal Skin: warm/dry no rash  2-D echo: Left ventricle: The cavity size was normal. Wall thickness was increased in a pattern of mild LVH. Systolic function was normal. The estimated ejection fraction was in the range of 55% to 60%. Wall motion was normal;  there were no regional wall motion abnormalities. Features are consistent with a pseudonormal left ventricular filling pattern, with concomitant abnormal relaxation and increased filling pressure (grade 2 diastolic dysfunction). Doppler parameters are consistent with high ventricular filling pressure.  ------------------------------------------------------------ Aortic valve: Trileaflet; mildly calcified  leaflets. Mobility was not restricted. Doppler: Transvalvular velocity was within the normal range. There was no stenosis. Mild regurgitation.  ------------------------------------------------------------ Aorta: Aortic root: The aortic root was mildly dilated.  ------------------------------------------------------------ Mitral valve: Calcified annulus. Mobility was not restricted. Mild prolapse, involving the anterior leaflet. Doppler: Transvalvular velocity was within the normal range. There was no evidence for stenosis. Mild regurgitation. Peak gradient: 7mm Hg (D).  ------------------------------------------------------------ Left atrium: The atrium was moderately dilated.  ------------------------------------------------------------ Right ventricle: The cavity size was mildly dilated. Systolic function was mildly reduced.  ------------------------------------------------------------ Pulmonic valve: Doppler: Transvalvular velocity was within the normal range. There was no evidence for stenosis. Trivial regurgitation.  ------------------------------------------------------------ Tricuspid valve: Structurally normal valve. Doppler: Transvalvular velocity was within the normal range. Severe regurgitation.  ------------------------------------------------------------ Pulmonary artery: Systolic pressure was severely increased.  ------------------------------------------------------------ Right atrium: The atrium was moderately dilated. There was the appearance of a Chiari  network.  ------------------------------------------------------------ Pericardium: There was no pericardial effusion.  ------------------------------------------------------------ Systemic veins: Inferior vena cava: The vessel was dilated.  CT Angio: Findings: The ascending aortic repair and graft are stable in appearance. The graft is widely patent. Persistent calcifications in the aortic valve are noted.  Stable dissection flap in the innominate artery extending into the right subclavian and axillary arteries. In the region of the innominate artery, the flap appears fenestrated. There is no evidence of extension into the right vertebral or common carotid arteries.  In the region of the axillary artery, one of the lumens has become more thrombosed. Please refer image 29 of series 301 on the sagittal reconstruction views. One of the lumens is blind ending. The true lumen extending into the peripheral axillary artery does become somewhat narrowed, approximately to 30% the diameter of the overall axillary artery diameter. A 10 mm saccular aneurysm extending superiorly from the axillary artery is stable. See image 22 of series 301 on the sagittal reconstruction images.  Left common carotid and subclavian arteries are widely patent. Left vertebral artery is patent.  Coronary artery saphenous vein bypass graft is opacified and patent. Right coronary artery opacifies. Calcifications in the circumflex and left anterior descending coronary artery are noted.  The heart is dilated. The left atrium is markedly dilated.  No obvious filling defects in the pulmonary arterial tree to suggest acute pulmonary thromboembolism. Pulmonary arteries are dilated worrisome for pulmonary hypertension.  Multiple lateral meningocele along the thoracic spine are stable. Sub centimeter left lung nodules on images 43, 32, and 49 are stable.  New 6 mm right middle lobe nodule on image 40.  Wall  thickening of the distal esophagus is stable and may be related to a hiatal hernia.  IMPRESSION: Ascending aortic repair is stable. Dissection extension into the innominate and subclavian artery is stable. There has been increasing thrombosis of one of the lumens of the dissection and extension into the axillary artery with narrowing of the true lumen. This is estimated at 70% narrowing. Right and left upper extremity systolic blood pressure measurements are recommended. If a large gradient is present, underlying significant narrowing secondary to the dissection would be suggested.  There are stable pulmonary nodules in the left lung but there is a new 6 mm right lung nodule. If the patient is at high risk for bronchogenic carcinoma, follow-up chest CT at 6-12 months is recommended. If the patient is at low risk for bronchogenic carcinoma, follow-up chest CT at 12 months is recommended. This recommendation follows the consensus statement: Guidelines for Management of Small Pulmonary Nodules Detected on CT  Scans: A Statement from the Fleischner Society as published in Radiology 2005; 237:395-400.  Stable thoracic lateral meningoceles. This finding can be associated with Neurofibromatosis, Ehler-Danlos syndrome, and Marfan's syndrome. Genetic workup can be performed to further characterize.  ASSESSMENT AND PLAN: 1. Chronic diastolic heart failure secondary to valvular heart disease. The patient has severe residual tricuspid regurgitation. Her volume status continues to look good on a combination of Aldactone and torsemide. She has a good functional capacity with [place] heart association class II symptoms. We'll continue same management.  2. Atrial fibrillation. The patient has maintained sinus rhythm now for quite some time on tedious and. She appears to be back in atrial fibrillation. She is not a candidate for cardioversion because of inability to take anticoagulation. We again  discussed pros and cons of anticoagulation, but considering all of the difficulty she has had with serious bleeding, we have elected to avoid this for now. She will start aspirin 81 mg.  3. Aortic dissection. CT scan was reviewed. She follows up with Dr. Cornelius Moras next week.  Tonny Bollman 05/16/2013 11:55 PM

## 2013-05-12 ENCOUNTER — Other Ambulatory Visit: Payer: Self-pay | Admitting: Pulmonary Disease

## 2013-05-14 ENCOUNTER — Ambulatory Visit (INDEPENDENT_AMBULATORY_CARE_PROVIDER_SITE_OTHER): Payer: Medicare Other | Admitting: Thoracic Surgery (Cardiothoracic Vascular Surgery)

## 2013-05-14 ENCOUNTER — Other Ambulatory Visit (HOSPITAL_COMMUNITY): Payer: Self-pay | Admitting: Physician Assistant

## 2013-05-14 ENCOUNTER — Encounter: Payer: Self-pay | Admitting: Thoracic Surgery (Cardiothoracic Vascular Surgery)

## 2013-05-14 VITALS — BP 116/76 | HR 78 | Resp 20 | Ht 67.0 in | Wt 160.0 lb

## 2013-05-14 DIAGNOSIS — I7101 Dissection of thoracic aorta: Secondary | ICD-10-CM
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
## 2013-05-16 ENCOUNTER — Encounter: Payer: Self-pay | Admitting: Cardiovascular Disease

## 2013-05-18 ENCOUNTER — Telehealth: Payer: Self-pay | Admitting: Physician Assistant

## 2013-05-18 NOTE — Telephone Encounter (Signed)
RASCHELLE WISENBAKER is a 78 y.o. female with valvular heart disease, diastolic CHF, AFib.  She called in this am with weight gain and edema.  She was told to take an extra torsemide 20 mg and call back.  Weight is down almost 3 lbs since she took the extra Torsemide.  She is at the beach and it sounds like she has had a little bit of extra salt in her diet.  She denies chest pain, dyspnea, palpitations.  Edema is better.  She has DJD in her R knee and it remains swollen.  I advised her to watch her weight.  She will take Torsemide 20 mg extra again tomorrow if her weight goes back up.  She will also take an extra K+ 20 mEq with it.  I advised her to go to the ED if she feels worse. Tereso Newcomer, PA-C   05/18/2013 6:51 PM

## 2013-05-18 NOTE — Telephone Encounter (Signed)
Pt called answering service from the beach today. She reports LEE, 4 lb weight gain overnight. No CP. HR does not feel irregular now, just more forceful. HR 82, BP 129/62. She is compliant with meds and salt restriction. She takes torsemide 60mg  daily and spironolactone and just took it 10 minutes ago. She has been very well controlled on this regimen for a while. She does not usually have to take any extra medication. I advised that she take an extra torsemide 20mg  pill at 2pm. If UOP does not begin to pick up and weight does not fall, she is to call us back with an update. If situation worsens she knows to go to ER. She verbalized understanding and agreement with plan. Xena Propst PA-C

## 2013-05-21 ENCOUNTER — Telehealth: Payer: Self-pay | Admitting: Cardiovascular Disease

## 2013-05-21 ENCOUNTER — Ambulatory Visit: Payer: Medicare Other

## 2013-05-21 ENCOUNTER — Telehealth: Payer: Self-pay | Admitting: Pulmonary Disease

## 2013-05-21 DIAGNOSIS — M25461 Effusion, right knee: Secondary | ICD-10-CM

## 2013-05-21 DIAGNOSIS — M25561 Pain in right knee: Secondary | ICD-10-CM

## 2013-05-21 NOTE — Telephone Encounter (Signed)
I spoke with pt and is aware of recs. Referral sent. Nothing further was needed

## 2013-05-21 NOTE — Telephone Encounter (Signed)
Per SN---   Need appt ASAP with ortho for eval of right knee pain/swelling.  Her ortho doctor unable to see her until aug 28 and pt feels that she cannot wait that long.  thanks

## 2013-05-21 NOTE — Telephone Encounter (Signed)
New Problem  Pt states she was at the beach and had some problems while she was there. She said that she had spoken with the on call Dr over the weekend but she wants to speak with you.

## 2013-05-21 NOTE — Telephone Encounter (Signed)
I spoke with the pt and on Saturday her weight was 163 so the pt did go ahead and take her normal medications plus an additional Torsemide 20mg  and potassium per 05/18/13 PA on-call instructions. The pt was very bothered with knee pain and decided to come home from the beach on Saturday. The pt has contacted the Orthopaedic office and they gave her an appointment for 07/13/13. Today the pt's weight is 160.2 lbs, BP 104/63, pulse 64, no ankle swelling and no CP or SOB.  The pt does notice that her heart beat is more forceful but not "flip-flopping". At this time I made the pt aware that she should continue to weigh daily, watch salt intake, and elevate her legs.  I also advised the pt that she needs to contact her PCP for evaluation of her knee pain and they may be able to arrange earlier Orthopaedic follow-up.  Pt agreed with plan and will call the office with any other questions or concerns.

## 2013-05-21 NOTE — Telephone Encounter (Signed)
Spoke with patient-- Patient c/o R constant R knee pain States her right knee hurts so bad its making her L knee and should hurt as well States she had to leave the beach early d/t the pain She Spoke with her othopedic Dr. But they are unable to see patient until Aug 29 Patient says she just can not wait that long She feels there is fluid in her knee, she also spoke with her cardiologist which gave her a diuretic and per patient it removed 5 lbs but is sstill swollen Patient requesting Dr. Kriste Basque refer her ASAP to orthopedic perferribly Dr. Rennis Chris or Dr. Lenda Kelp? Dr. Kriste Basque please advise, thank you  Last OV: 04/11/13 Next OV: 10/17/13  Current Outpatient Prescriptions on File Prior to Visit  Medication Sig Dispense Refill  . aspirin EC 81 MG tablet Take 1 tablet (81 mg total) by mouth daily.  1 tablet  0  . cholecalciferol (VITAMIN D) 1000 UNITS tablet Take 1,000 Units by mouth daily.       . clorazepate (TRANXENE) 7.5 MG tablet TAKE 1 TABLET BY MOUTH 3 TIMES A DAY AS NEEDED FOR NERVES  90 tablet  5  . Docusate Calcium (STOOL SOFTENER PO) Take by mouth at bedtime.      . gabapentin (NEURONTIN) 300 MG capsule TAKE 2 CAPSULES BY MOUTH AT BEDTIME  60 capsule  4  . glucosamine-chondroitin 500-400 MG tablet Take 1 tablet by mouth 2 (two) times daily.      Marland Kitchen HYDROcodone-acetaminophen (NORCO/VICODIN) 5-325 MG per tablet TAKE 1 TABLET EVERY 8 HOURS AS NEEDED FOR PAIN  50 tablet  5  . Levofloxacin (LEVAQUIN PO) Take by mouth daily.      . metoprolol (LOPRESSOR) 25 MG tablet Take 0.5 tablets (12.5 mg total) by mouth 2 (two) times daily.  33 tablet  11  . mupirocin ointment (BACTROBAN) 2 % Apply 1 application topically daily.       Marland Kitchen omeprazole (PRILOSEC) 20 MG capsule TAKE 1 CAPSULE BY MOUTH  TWICE A DAY 30 MINUTES BEFORE MEALS  60 capsule  6  . OVER THE COUNTER MEDICATION 2 (two) times daily. pericolace      . Polyethylene Glycol 3350 (MIRALAX PO) Take by mouth at bedtime.      . potassium chloride SA  (K-DUR,KLOR-CON) 20 MEQ tablet TAKE 1 TABLET (20 MEQ TOTAL) BY MOUTH DAILY.  30 tablet  6  . sertraline (ZOLOFT) 50 MG tablet TAKE 1 TABLET BY MOUTH EVERY MORNING  30 tablet  6  . spironolactone (ALDACTONE) 25 MG tablet TAKE 1 TABLET (25 MG TOTAL) BY MOUTH DAILY.  30 tablet  5  . TIKOSYN 250 MCG capsule TAKE ONE CAPSULE BY MOUTH EVERY 12 HOURS  60 capsule  11  . torsemide (DEMADEX) 20 MG tablet TAKE 3 TABLETS (60 MG TOTAL) BY MOUTH DAILY.  90 tablet  5  . traMADol (ULTRAM) 50 MG tablet TAKE 1 TABLET (50 MG TOTAL) BY MOUTH 3 (THREE) TIMES DAILY AS NEEDED. FOR PAIN  90 tablet  3  . vitamin C (ASCORBIC ACID) 500 MG tablet Take 500 mg by mouth daily.       No current facility-administered medications on file prior to visit.

## 2013-05-26 ENCOUNTER — Telehealth: Payer: Self-pay | Admitting: Adult Health

## 2013-05-26 NOTE — Telephone Encounter (Signed)
Patient called concerned about HR and BP being too low. She states that she also can feel that she is in back in Atrial fibrillation. She is taking her BP more than once a day. She feels tired and weak.    BP this am 97-107 systolic with HR in the 50's. She states that she is taking tikosyn and metoprolol along with 60 mg of torsemide. She is concerned that she is on too much medication. She appears very anxious.   I have recommended that she not take the evening dose of the metoprolol (12.5 mg ) due to hypotension. She is to rest today and be careful getting up from a sitting to standing position.   In the am, cut back on demedex to 40 mg . She is to call me to report her symptoms and BP in the am. She verbalizes understanding.

## 2013-05-28 ENCOUNTER — Encounter: Payer: Self-pay | Admitting: Internal Medicine

## 2013-05-28 ENCOUNTER — Telehealth: Payer: Self-pay | Admitting: Cardiovascular Disease

## 2013-05-28 NOTE — Telephone Encounter (Signed)
I spoke with the pt and she felt so "washed out" on Saturday that she did not leave the house.  The pt spoke with Joni Reining NP on Saturday and followed her instructions.  Saturday night the pt was SOB and had to sleep on 3 pillows. The pt spoke with Samara Deist again on Sunday and due to SOB the pt was instructed to take her normal dose of Demadex. Today the pt called because she wanted to just confirm what her BP and pulse range should be on a regular basis.  I made the pt aware that we would like her pulse to be greater than 50 and SBP 100 and above. Pt's weight today 161.4 lbs. The pt said she continues to feel her heart going in and out of Afib. At this time the pt will continue to monitor BP, pulse and weight and call the office with any other questions or concerns.

## 2013-05-28 NOTE — Telephone Encounter (Signed)
New problem    Talk with PA/ NPA on call this weekend   C/O Afib. Started a new medication. Has question as why heart rate so low.

## 2013-06-06 ENCOUNTER — Other Ambulatory Visit: Payer: Self-pay | Admitting: Pulmonary Disease

## 2013-06-07 ENCOUNTER — Other Ambulatory Visit (HOSPITAL_COMMUNITY): Payer: Self-pay | Admitting: Pulmonary Disease

## 2013-06-08 ENCOUNTER — Ambulatory Visit: Payer: Medicare Other

## 2013-06-13 ENCOUNTER — Ambulatory Visit
Admission: RE | Admit: 2013-06-13 | Discharge: 2013-06-13 | Disposition: A | Discharge status: Home / Self Care | Payer: Medicare Other | Source: Ambulatory Visit

## 2013-06-13 DIAGNOSIS — Z1231 Encounter for screening mammogram for malignant neoplasm of breast: Secondary | ICD-10-CM

## 2013-06-13 LAB — HM MAMMOGRAPHY

## 2013-06-25 ENCOUNTER — Encounter: Payer: Self-pay | Admitting: Internal Medicine

## 2013-07-02 ENCOUNTER — Other Ambulatory Visit: Payer: Self-pay | Admitting: Pulmonary Disease

## 2013-07-03 ENCOUNTER — Other Ambulatory Visit: Payer: Self-pay | Admitting: Pulmonary Disease

## 2013-07-12 ENCOUNTER — Telehealth: Payer: Self-pay | Admitting: Cardiovascular Disease

## 2013-07-12 DIAGNOSIS — R252 Cramp and spasm: Secondary | ICD-10-CM

## 2013-07-12 NOTE — Telephone Encounter (Signed)
Pt would like for you to call her re problems with her heart

## 2013-07-12 NOTE — Telephone Encounter (Signed)
I spoke with the pt and she has been having cramps in her feet and legs and wonders if her potassium is low.  I will have the pt come into the office for a BMP. The pt also states that while sitting in [employer] she developed mid left breast tightness that radiated into her shoulder and then into left ear.  This feeling was very quick and resolved. The pt has had no recurrent symptoms. I made the pt aware that this sounds very atypical and she can continue to monitor for further symptoms. Pt agreed with plan.

## 2013-07-12 NOTE — Telephone Encounter (Signed)
Left message on machine that I will attempt to reach the pt again later today.

## 2013-07-13 ENCOUNTER — Other Ambulatory Visit (INDEPENDENT_AMBULATORY_CARE_PROVIDER_SITE_OTHER): Payer: Medicare Other

## 2013-07-13 DIAGNOSIS — R252 Cramp and spasm: Secondary | ICD-10-CM

## 2013-07-13 LAB — BASIC METABOLIC PANEL
Calcium: 9.1 mg/dL (ref 8.4–10.5)
GFR: 55.41 mL/min — ABNORMAL LOW (ref >60.00)
Potassium: 4.1 mEq/L (ref 3.5–5.1)
Sodium: 137 mEq/L (ref 135–145)

## 2013-07-25 ENCOUNTER — Telehealth: Payer: Self-pay | Admitting: Pulmonary Disease

## 2013-07-25 NOTE — Telephone Encounter (Signed)
Per SN---  Pt will need OV to be checked.  Ok to add on TP tomorrow.

## 2013-07-25 NOTE — Telephone Encounter (Signed)
Spoke with the pt She states that she received a letter from her Prescription [employer] Letter states that claim rejected for tikosyn, klor con, and tramadol due to drug utilization drug alert I called the pharmacist at [facility] Jill Alexanders) and he states does not see any problem with her taking these meds and insurance it covering these to not to worry Pt aware of this and will let us know if she has trouble refilling these meds in the future

## 2013-07-25 NOTE — Telephone Encounter (Signed)
Spoke with pt and notified of recs per SN She verbalized understanding OV with TP for 2 pm tommorrow

## 2013-07-25 NOTE — Telephone Encounter (Signed)
Spoke with pt and she reports having a red spot on left side of back near panty line that itches.  PT denies pain in this are.  Also notices a red rash on stomach area also.  Pt is concerned that this may be shingles. Also c/o increase bruising and petechiae on arms and legs for past week.  Please advise

## 2013-07-26 ENCOUNTER — Encounter: Payer: Self-pay | Admitting: Adult Health

## 2013-07-26 ENCOUNTER — Other Ambulatory Visit (INDEPENDENT_AMBULATORY_CARE_PROVIDER_SITE_OTHER): Payer: Medicare Other

## 2013-07-26 ENCOUNTER — Ambulatory Visit (INDEPENDENT_AMBULATORY_CARE_PROVIDER_SITE_OTHER): Payer: Medicare Other | Admitting: Adult Health

## 2013-07-26 VITALS — BP 112/62 | HR 76 | Temp 97.0°F | Ht 67.0 in | Wt 157.4 lb

## 2013-07-26 DIAGNOSIS — T148XXA Other injury of unspecified body region, initial encounter: Secondary | ICD-10-CM

## 2013-07-26 DIAGNOSIS — L255 Unspecified contact dermatitis due to plants, except food: Secondary | ICD-10-CM | POA: Insufficient documentation

## 2013-07-26 DIAGNOSIS — Z23 Encounter for immunization: Secondary | ICD-10-CM

## 2013-07-26 LAB — CBC WITH DIFFERENTIAL/PLATELET
Basophils Absolute: 0 10*3/uL (ref 0.0–0.1)
Eosinophils Relative: 1.2 % (ref 0.0–5.0)
HCT: 37.1 % (ref 36.0–46.0)
Hemoglobin: 12.4 g/dL (ref 12.0–15.0)
Lymphocytes Relative: 18.3 % (ref 12.0–46.0)
Lymphs Abs: 1.1 10*3/uL (ref 0.7–4.0)
Monocytes Relative: 7.2 % (ref 3.0–12.0)
Neutro Abs: 4.2 10*3/uL (ref 1.4–7.7)
RBC: 3.86 Mil/uL — ABNORMAL LOW (ref 3.87–5.11)
WBC: 5.8 10*3/uL (ref 4.5–10.5)

## 2013-07-26 NOTE — Progress Notes (Signed)
Subjective:    Patient ID: [name], female    DOB: Jan 04, 1935, 78 y.o.   MRN: [id number]  HPI 78 y/o WF      Followed by DrCooper for Cards- hx ASD repair 1982 & dilated AoRoot;  HBP & PAF;  then Aortic dissection w/ emergency surg 7/11 by DrOwen w/ CABG x2 and miraculous recovery...   Followed by DrDBrodie for GI- hx dysphagia & esoph strictures dilated, also hx candida esoph... also prob w/ constip part related to rectocele...   Followed by [facility] & ENT at [facility] for severe epistaxis requiring mult surg in 2013 (see below)...  ~  September 04, 2010:  She developed sudden left sided back & chest pain 06/04/10 w/ signs of ischemia & taken to cath lab w/ AoDissection found along w/ Lmain disease- 11hr emergency operation by DrOwen w/ repair of aortic dissection & CABG x2 (SVG to LAD, & SVG to obtuse marg branch of Circ) was required to get her off the bypass machine... she made a miraculous recovery- followed closely by DrOwen & DrCooper w/ meds as below... getting stonger w/ home health rehab, etc... some recent incr trouble w/ fluid retention & they have adjusted diuretics... XRays & labs reviewed... I have offered assist in any way needed- she requests refill Rxs to [facility].  ~  September 07, 2011:  1 year ROV & she has had a lot going on >> long prob list & multisystem disease w/ mult specialists involved in her care...    Abn CXR> s/p AscAo dissection repair & bilat apical schwannomas (see CXR & CTA report); O2sat= 91% on RA & advised incr exercise at home...    HBP> on Metoprolol50Bid, Lasix80Bid, Zaroxyln2.5 2d/wk, K20Tid- ;  BP= 114/68 & labs showed K= 3.2; advised incr K20 to 2Bid everyday...    CARDIAC> followed by DrCooper & DrOwens w/ long complic cardiac hx> ASD secundum repair at [facility], CAD, PAF, Ac on Chr Diastolic CHF, & 7/11 Asc ThorAo Dissection w/ severe AI & Lmain lesion==> s/p repair of dissecting aneurysm & CABGx2 [id number]... She knows to take Amox before  dental work.    PAF> on ASA81 + Coumadin in CC, off Amio, followed by DrCooper for Cards    CHOL> on Crestor5 & her FLP looks good (see below)...    DM> borderline DM on diet alone & labs look good; continue diet + exercise...    Hx esophagitis & stricture> followed by DrDBrodie on Prilosec20Bid; last EGD 3/11ndida (Rx'd); denies heartburn, pain, dysphagia, etc...    IBS w/ constip & Rectocele> also followed by DrGottsegen; on stool softeners but she has trouble w/ evacuation & they are aware...    Left flank discomfort> she also sees Urology (prev DrKimbrough) & has f/u appt pending...    DJD/ LBP/ FM> she is actually c/o "corns" on her feet that make walking difficult she says; she will try OTC pads & if not improved she will call Podiatry...    Neuropathy> followed by Autumn Patty on Neurontin300Bid & Tramadol Prn...    Anxiety/ Depression> on Zoloft50, Tranxene7.5 Prn (ave~1/d she says), & [id number];   ~  March 07, 2012:  82mo ROV & [name] has been thru a lot since last OV>>    Hx Aortic replacement & AFib prev on Coumadin> Feb2013 developed severe epistaxis:    Treated for bilat severe epistaxis 12/27/11 w/ bilat endoscopic sphenopalatine ligation & packing by [facility]Gore at [facility]; packing removed 2/18 by DrShoemaker in his office.Marland KitchenMarland Kitchen  Anemic due to acute blood loss> prev Hg= 10-11 range & was 7.7 when Tx to [facility]...    Re-adm 2/28 - 01/14/12 for recurrent bleeding from right nares- packed & attempted embolization was unsuccessful; transferred to [facility] by [facility] for further surg...    She had further surg by ENT at [facility] but we don't have any of these records or f/u note by ENT in [place]... She has had mult f/u visits w/ DrCooper for Cards> back on Coumadin for her AFib (intol to Amio & Coumadin carefully monitored by CC), right heart failure w/ severe TR & no surg option, extremely fatigued/ exhaused/ doing very poorly overall & she feels she has not recovered from the ENT surg/ nose bleeding  episodes... CXR 4/13 showed chr severe cardiomeg, prev CABG, chr pleural blunting but no acute effusions, post-op right axillary changes, NAD... LABS 4/13:  Chems- ok w/ K=4.0 BUN=42 Creat=1.1 BS=122 A1c=7.1;  CBC- Hg=9.1 Fe=18 (4%);  TSH=4.49;  B12=849;  PROTIME= WAY TOO THIN & referred to CC- stat...  ~  Apr 05, 2012:  62mo ROV> [name] remains very weak & is having difficulty at home- fell in [place] yest when husb was out, couldn't stand, crawled to cellphone & husb ret to help her up, bruised right lat ribs & left hip area (XRays today are neg);  She notes her walking is worse since her last surg & we discussed the need for PHYSICAL THERAPY- walking, strengthening, balance;  She has Neuro f/u w/ DrLove pending;  "My goal is to get in my car and GO and DO!" she says...    She saw DrCooper for Cards f/u earlier this month & there doesn't look like there is much else he can do;  F/u CTA 5/13 showed stable prox Ao graft- see extensive report by DrYamagata (reviewed)...    She saw DrFontaine for GYN f/u- c/o labial swelling & tenderness- infected seb cyst, drained, Rx Doxy Bid, & improved... CXR 5/13 showed stable cardiomegaly & median sternotomy, atherosclerotic calcif in AO, clear lungs, osteopenia, NAD... Right RIB FILMS are neg for any fractures... Left HIP FILM is neg for fx, pos for osteopenia... LABS 5/13:  Chems- ok x HCO3=37;  CBC- improved w/ Hg=11.1 Fe=132 (38%)  ~  June 12, 2012:  698mo ROV & post hosp check> [name] is now [facility] IMPROVED having been placed on Tikosyn 6/13 Hosp for AFib/ CHF/ weakness; she converted to NSR, Lasix changed to Demadex/ Aldactone & Cards is watching very carefully;  She has been walking >6mi daily again;  She is also back on Coumadin via CC w/ only minor epistaxis noted...  She saw DrCooper 6/13 post-hosp check> doing well onTikosyn 250mg  Bid, Metoprolol 12.5mg  bid, Demadex 20mg -3tabs daily, Aldactone25mg /d, K20/d, and Coumadin via clinic...    We reviewed prob list,  meds, xrays and labs> see below for updates >> CXR 6/13 showed s/p median sternotomy, cardiomeg, tort Ao, left base scarring/ atx, NAD... LABS 6/13 Hosp reviewed...  ~  October 09, 2012:  98mo ROV & [name] is stable but still notes some upper airway congestion, sore throat, hoarseness, & some blood from right nares; we decided to treat w/ Augmentin, Flonase, Mucinex, Fluids, Nasal saline, etc...   ~  Apr 11, 2013:  62mo ROV & [name] had recurrent epistaxis 3/14> Hosp overnight at [facility] & DrCooper decided to STOP her Coumadin (holding NSR w/ the Tikosyn) and she improved but has persistent nasal congestion, bloody drainage, etc; review of EPIC records also indicates +  for MRSA on screening; We decided to Rx w/ MUCINEX600mg -2Bid, Fluids, NASAL SALINE Q1h, & 7d course of KEFLEX 500mg Tid to see if this improves...     Recurrent Epistaxis>  As above... She was [facility] for another severe epistaxis episode 3/14; still has bloody crusty drainage in nose- Rx w/ SALINE, Mucinex, 7d course of KEFLEX (hx +MRSA noted),,,    Abn CXR> s/p AscAo dissection repair & bilat apical schwannomas (see CXR & CTA report); CXR 6/13 showed cardiomeg, median sternotomy, tort Ao, basilar scarring/ Atx; O2sat= 98% on RA & advised incr exercise at home...    HBP> on Metoprolol25-1/2Bid, Demadex20-3/d, Aldactone25, K20;  BP= 108/80 & labs showed K= 4.1    CARDIAC> followed by DrCooper & DrOwens w/ long complic cardiac hx> ASD secundum repair at [facility] 1982, CAD, PAF, Ac on Chr Diastolic CHF, & 7/11 Asc ThorAo Dissection w/ severe AI & Lmain lesion==> s/p repair of dissecting aneurysm & CABGx2 [id number]; She knows to take Amox before dental work; she saw DrCooper 2/14 & again 3/14 in [place] for Epistaxis where they decided to STOP the Coumadin due to the bleeding & the fact that she was maintaining NSR on Tikosyn...    PAF> on Tikosyn250Bid + off Coumadin now;  followed by DrCooper for Cards & holding NSR w/o CP, palpit, etc...     CHOL> on diet alone now & off prev Crestor5; last FLP was 2012 & it needs repeat on diet alone...    DM> borderline DM on diet alone & labs 5/14 showed BS= 102, A1c=5.9    Hx esophagitis & stricture> followed by DrDBrodie on Prilosec20prn; last EGD 3/11 showed candida (Rx'd); denies heartburn, pain, dysphagia, etc...    IBS w/ constip & Rectocele> also followed by DrGottsegen; on stool softeners but she has trouble w/ evacuation & they are aware...    Left flank discomfort> likely musculoskeletal- take tramadol/ Vicodin; she also sees Urology & was checked by DrDahlstedt & PA 3/14> Urine was clear...    DJD/ LBP/ FM> she is stable on Tramadol & Vicodin as needed for pain; mult prev MRI's w/ paraapinal meningoceles, hemangiomas, perineural cysts- checked by drStern 2/14 & felt to be benign & incidental no further eval warranted...    Neuropathy> followed by Autumn Patty (last seen 11/13) on Neurontin300-2Qhs & Tramadol Prn...    Anxiety/ Depression> on Zoloft50, Tranxene7.5 Prn (ave~1/d she says); they are under incr stress w/ son Lynden Ang) & daugh (divorce)... We reviewed prob list, meds, xrays and labs> see below for updates >>  LABS 5/14:  Chems- wnl w/ BS=102 A1c=5.9 Cr=1.0;  CBC- ok w/ Hg=12.8  07/26/2013 Acute OV  Complains of ? shingles w/ blisters on right breast x1week, midline on back x2days, right arm/knee onset today -- itches.  also reports some increased petechiae x4-5days. Not painful or sensitive. Itches when bumps pop up.  Does work with flowers and occasionally pulls weeds.  No pets.  No fever, drainage or redness.  Does notice more bruising on arms and legs.  On ASA 81mg  daily  Has been have joint pain in shoulder/knees, with OA-seen by ortho with steroid injections -some help.            Problem List:  SEVERE EPISTAXIS >> see above & managed by DrBates et al + ENT at [facility]=> Dr Eliezer Lofts treated w/ max med Rx w/ Ab for 3wks, nasal saline spray Tid, etc; f/u CT sinuses was improved,  perforation in nasal septum noted & they offered surg repair but she  is holding off...  OBSTRUCTIVE SLEEP APNEA (ICD-327.23) - sleep study 9/06 by Breck Coons showed RDI=25/hr during REM w/ desat to 77%...  ABN CXR w/ left superior mediastinal opacity ?etiology- no change back to 2006 films= benign... ~  CXR 2/11 showed cardiomeg, ectatic Ao, left superior mediast soft tissue prom w/o change (?ectasia of great vessels or benign mass). ~  CXR 9/11 s/p median sternotomy, dissection repair,  & CABG- cardiomeg, bilat effusions & basilar atx, no ch in left superior lesion... ~  CXR 4/12 showed med sternotomy, cardiomeg, hyerinflation/ scarring, some vasc congestion... ~  CTAngio 5/12 showed stable asc ao dissection repair, rounded soft tissues masses both apicies L>R are prob neural tumors (schwannomas)... ~  CXR 4/13 & 5/13 showed chr severe cardiomeg, prev CABG, chr pleural blunting but no acute effusions, post-op right axillary changes, NAD.Marland Kitchen. ~  CTAngio 5/13 showed stable appearance in the chest w/ mult findings (SEE REPORT); stable appearance in the abd as well (SEE REPORT)...  HYPERTENSION (ICD-401.9) - controlled on METOPROLOL 50mg Bid, + LASIX/ ZAROXYLN/ KCl...  ~  10/12:  BP=110/70 and she denies HA, visual changes, CP, palipit, syncope... she is fatigued, SOB/ DOE, & +edema... getting PT/ rehab therapy & improving slowly... ~  4/13:  BP= 100/70 & she is very weak s/p epistaxis episodes 2/13 & 3/13 w/ surg here & at [facility]... ~  5/13:  BP= 100/70 & she is very weak... ~  7/13:  BP= 120/74 & she is feeling much better in NSR on Tikosyn... ~  11/13: BP= 102/60 & she denies CP, palpit, ch in SOB, edema, etc... ~  5/14: on Metoprolol25-1/2Bid, Demadex20-3/d, Aldactone25, K20;  BP= 108/80 & labs showed K= 4.1  ATRIAL FIBRILLATION (ICD-427.31) - on COUMADIN followed in the CC and very carefully monitored due to her severe epistaxis; managed by DrCooper for Cards & she is intol to  Amiodarone...  CORONARY ARTERY DISEASE (ICD-414.00) - off ASA due to severe epistaxis episodes.. ACUTE ON CHRONIC DIASTOLIC HEART FAILURE (ICD-428.33) - prev on Lasix, Zaroxylyn, & KCl; Hosp 6/13 for Tikosyn Rx & much improved in NSR on this & Demadex/ Aldactone/ KCl/ Coumadin... 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 Hx of ATRIAL SEPTAL DEFECT (ICD-745.5) - s/p ASD secundum repaired at [facility] in [facility]... long-time pt of DrJoeLeBauer and now DrCooper> ~  Cardiolite 5/06 showed no ischemia & EF=65%...  ~  2DEcho 12/07 showed norm LVF, EF=55%, no regional wall motion abn, & RA/RV= mod dil... ~  Cardiac MRI 3/09 showed mod Ao root enlargement 4.3cm, norm AoV/ Arch/ Desc Ao, mod RV & biatrial enlargement from the prev ASD repair (no resid leak), mod PA enlargement, norm LV w/ EF=59%... ~  [facility] 9/09 w/ MI ruled-out, atyp CP (prob esoph spasm) & Myoview was neg- no scar or ischemia, EF= 77%... ~  2DEcho 9/10 showed sl incr LV wall thikness c/w mild LVH, norm wall motion, EF= 55-60%, gr2 DD, mild AI, mod dil LA/RA, mod-severe TR, mild pulmHTN... ~  Emergency hosp 7/11 w/ Aortic dissection, severe AI, severe TR & cath w/ 40%Lmain lesion- s/p repair & required CABG x2 as well... ~  DrOwen does CTAngiograms every 73mo... ~  [facility] 4/12 by Cards for CHF from severe TR & right heart disease> SEE 2DEcho & Right heart cath data> reviewed... ~  She saw DrCooper 10/12> felt to be reasonably stable, holding NSR, on Coumadin via CC, & f/u 2DEcho ordered & pending... ~  Labs 10/12 showed K=3.2 on  K20Tid+4MTh; TCO2=37, BUN=28, Creat=0.8; pt instructed to incr K20 to 2Bid everyday... ~  Serial labs avail in the results section... Labs 5/13 showed BUN=21, Creat=0.8, K=3.9 ~  HOSP 6/13 by Cards & much improved in NSR on TIKOSYN 250mg Bid, DEMADEX20mg - 3/d, ALDACTONE25mg /d, KCl 20/d; and COUMADIN via CC...  VENOUS INSUFFICIENCY (ICD-459.81) & EDEMA (ICD-782.3) - she has chr VI changes,  superficial VV, and incr edema since surg 7/11... ~  3/09 ABI's done and were WNL...  HYPERCHOLESTEROLEMIA (ICD-272.0) - on CRESTOR 5mg Qod & she wants to STOP for awhile; she has blamed Zetia for cough in the past... ~  FLP 7/08 showing TChol 143, TG 173, HDL 32, LDL 76. ~  FLP 4/09 showed TChol 172, Tg 161, HDL 38, LDL 102 ~  FLP 7/09 showed TChol 167. Tg 148, HDL 40, LDL 98... rec- continue same. ~  FLP 1/10 showed TChol 177, TG 85, HDL 43, LDL 117 ~  FLP 2/11 showed TChol 160, TG 83, HDL 50, LDL 94 ~  FLP 10/12 on Cres5 showed TChol 150, TG 81, HDL 52, LDL 82 ~  4/13:  She wants to STOP the Cres5Qod for awhile since she is so weak... ~  5/14:  She needs to ret FASTING for FLP on diet alone...  DIABETES MELLITUS, BORDERLINE (ICD-790.29) - on diet Rx alone... ~  Hx borderline BS w/ FBS=119 in 7/08 & HgA1c=6.1 on diet alone. ~  labs 4/09 showed BS= 120, HgA1c= 6.0.Marland KitchenMarland Kitchen continue diet rx. ~  labs 7/09 showed BS= 132 ~  labs 1/10 showed BS= 120, A1c= 6.1 ~  labs 2/11 showed BS= 106 ~  Labs 10/12 showed BS= 121, A1c= 6.3.Marland KitchenMarland Kitchen Ok on diet Rx. ~  Labs 4/13 showed BS= 122, A1c= 7.1 on diet alone & we reviewed low carb diet restriction... ~  11/13:  No DM retinopathy per DrStoneburner... ~  5/14: borderline DM on diet alone & labs 5/14 showed BS= 102, A1c=5.9  Hx of ESOPHAGEAL STRICTURE (ICD-530.3) - last EGD 6/04 by DrDBrodie w/ esophagitis, hx gastritis... 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 treated w/ PRILOSEC 20mg Bid... ~  [facility] Sep09 w/ atyp CP felt to be esoph spasm... GI f/u DrDBrodie w/ EGD Sep09= candida esoph, nonobstructing esoph stricture dilated. ~  2/11: presents w/ incr dysphagia & f/u GI w/ Ba Esophagram-mild stricture;  EGD 3/11 showed candida esophagitis (Rx'd);  Sonar- no gallstones, WNL.Marland KitchenMarland Kitchen  IRRITABLE BOWEL SYNDROME, HX OF (ICD-V12.79) - colonoscopy was 6/04 by DrDBrodie= neg without polyps etc... f/u 9/09 was negative- no  recurrent polyps... f/u planned 9/14... ~  she reports trouble w/ constip and hx recurrent rectocele- initial surg 1987, now sees DrGottsegen for GYN & may need additional surg... ~  She saw DrDBrodie 9/12> sm vol intermit painless rectal bleeding from anal fissures; rectocele, constip, difficulty evacuating; on stool softeners & Nupercainal oint.  UROLOGY> followed by DrKimbrough> Hx microhematuria, renal cyst, duplication & dilatation on the left, left sided pain off & on...  DEGENERATIVE JOINT DISEASE (ICD-715.90) >> on TRAMADOL 50mg  prn  BACK PAIN, LUMBAR (ICD-724.2) >> she also takes MVI, Vit D...  FIBROMYALGIA (ICD-729.1)  PERIPHERAL NEUROPATHY (ICD-356.9) - sl worse per pt and DrLove follows... she takes NEURONTIN 300mg Bid and refuses other meds... 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  ?TIA - hosp 11/07 by Autumn Patty w/ ?TIA vs sleep paralysis episode... MRIBrain w/  sm vessel dis and atherosclerotic changes... she still complains of "weak spells, my whole body gets weak in the afternoons"... these episodes last ~59min, resolve spont w/ rest or ?better after eating... she is convinced that the sleep paralysis episodes were caused by the Requip med...  ANXIETY DEPRESSION (ICD-300.4) - she states that "my nerves are shot" w/ stress from son's alcoholism & divorce, plus her husb's illness etc... started ZOLOFT 50mg /d 7/10 & improved...  ANEMIA >> due to Anemia of chronic dis & blood loss from epistaxis 2/13 & 3/13... ~  Labs from 2/13 showed baseline Hg= 10-11 range & bleed down to Hg=7.7 on Tx to [facility]... ~  Labs here 4/13 showed Hg= 9.1, Fe= 18 (4%sat); started on FeSO4 325mg  Bid (w/ VitC 500mg ) but she only took once daily... ~  Labs 5/13 showed Hg= 11.1, Fe= 132 (38%)... On Fe one daily... ~  Labs 5/14 showed Hg= 12.8  Health Maintenance: ~  GYN= DrGottsegen w/ Mammograms at [facility] (last 4/10  w/ ultrasound f/u)... Gyn does BMD's and she reports it was normal several yrs ago, not on calcium, MVI, etc... ~  Immunizations:  she reports 2010 Flu vaccine & Pneumonia shot in 2010... cna't recall last Tetanus.   Past Medical History  Diagnosis Date  . OSA (obstructive sleep apnea)   . HTN (hypertension)   . Atrial fibrillation     paroxysmal; on coumadin  . CAD (coronary artery disease)     a. cath 7/11: LM 40%, mild plaque disease in CFX, LAD, and RCA;  b. 2011 CABG with S-LAD and S-OM done at time of Aortic dissection repair  . Chronic diastolic CHF (congestive heart failure)     a. 08/2011 Echo: EF 55-60%, PASP  . Dissection of aorta, thoracic     a. Type A; s/p repair 7/11 with aortic root repair and CABG x 2   . ASD (atrial septal defect)     a. s/p repair 1982 at [facility]  . Right heart failure     a. 2/2 TR and RV dysfxn;  b. echo 4/12: EF 60%, mild LVH, mild AI, mild MR, mod LAE, mod RVE with mod dec. RVSF, mod RAE, mod to severe TR, PASP 58;  c. right heart cath 4/12:  RA mean 8, RV 46/1 with mean 6, PA 45/13 with mean 26, PCWP mean 14, CO 3.68, CI 2.1 (no sig pulmon HTN)  . GERD (gastroesophageal reflux disease)   . IBS (irritable bowel syndrome)   . Esophageal stricture   . Colonic polyp   . Peripheral neuropathy   . Back pain   . Fibromyalgia   . Anxiety   . HLD (hyperlipidemia)   . Borderline diabetes   . History of TIAs   . DJD (degenerative joint disease)   . Normocytic anemia   . Thrombocytopenia   . Macular degeneration   . Depression   . Esophageal dysmotilities     Past Surgical History  Procedure Laterality Date  . Asd repair  1982    [facility]  . Rotator cuff repair  2007    right  . Cardioversion      x 3  . Tubal ligation    . Appendectomy    . Vaginal hysterectomy  1987    A/P Repair With Cystocele and rectocele repair  . Tonsillectomy    . Oophorectomy  1994    BSO  . Pelvic laparoscopy  1994  . Emergency redo median sternotomy for  hemiarch repair  of acute type a aortic  dissection  06/05/2010  . Nasal hemorrhage control  12/27/2011    Procedure: EPISTAXIS CONTROL;  Surgeon: Melvenia Beam, MD;  Location: [facility] OR;  Service: ENT;  Laterality: N/A;    Outpatient Encounter Prescriptions as of 07/26/2013  Medication Sig Dispense Refill  . aspirin EC 81 MG tablet Take 1 tablet (81 mg total) by mouth daily.  1 tablet  0  . cholecalciferol (VITAMIN D) 1000 UNITS tablet Take 1,000 Units by mouth daily.       . clorazepate (TRANXENE) 7.5 MG tablet TAKE 1 TABLET BY MOUTH 3 TIMES A DAY AS NEEDED FOR NERVES  90 tablet  5  . gabapentin (NEURONTIN) 300 MG capsule TAKE 2 CAPSULES BY MOUTH AT BEDTIME  60 capsule  4  . glucosamine-chondroitin 500-400 MG tablet Take 1 tablet by mouth 2 (two) times daily.      Marland Kitchen HYDROcodone-acetaminophen (NORCO/VICODIN) 5-325 MG per tablet TAKE 1 TABLET EVERY 8 HOURS AS NEEDED FOR PAIN  50 tablet  5  . metoprolol tartrate (LOPRESSOR) 25 MG tablet TAKE 0.5 TABLETS (12.5 MG TOTAL) BY MOUTH 2 (TWO) TIMES DAILY.  33 tablet  11  . omeprazole (PRILOSEC) 20 MG capsule TAKE 1 CAPSULE BY MOUTH TWICE A DAY 30 MINUTES BEFORE MEALS  60 capsule  6  . Polyethylene Glycol 3350 (MIRALAX PO) Take by mouth at bedtime.      . potassium chloride SA (K-DUR,KLOR-CON) 20 MEQ tablet TAKE 1 TABLET (20 MEQ TOTAL) BY MOUTH DAILY.  30 tablet  6  . senna-docusate (SENOKOT-S) 8.6-50 MG per tablet Take 1 tablet by mouth 2 (two) times daily.      . sertraline (ZOLOFT) 50 MG tablet TAKE 1 TABLET BY MOUTH EVERY MORNING  30 tablet  6  . spironolactone (ALDACTONE) 25 MG tablet TAKE 1 TABLET (25 MG TOTAL) BY MOUTH DAILY.  30 tablet  5  . TIKOSYN 250 MCG capsule TAKE ONE CAPSULE BY MOUTH EVERY 12 HOURS  60 capsule  11  . torsemide (DEMADEX) 20 MG tablet TAKE 3 TABLETS (60 MG TOTAL) BY MOUTH DAILY.  90 tablet  5  . traMADol (ULTRAM) 50 MG tablet TAKE 1 TABLET (50 MG TOTAL) BY MOUTH 3 (THREE) TIMES DAILY AS NEEDED. FOR PAIN  90 tablet  3  . vitamin C  (ASCORBIC ACID) 500 MG tablet Take 500 mg by mouth daily.      . [DISCONTINUED] OVER THE COUNTER MEDICATION 2 (two) times daily. pericolace      . [DISCONTINUED] Docusate Calcium (STOOL SOFTENER PO) Take by mouth at bedtime.      . [DISCONTINUED] Levofloxacin (LEVAQUIN PO) Take by mouth daily.      . [DISCONTINUED] metoprolol tartrate (LOPRESSOR) 25 MG tablet Take 0.5 tablets (12.5 mg total) by mouth 2 (two) times daily.  1 tablet  0  . [DISCONTINUED] mupirocin ointment (BACTROBAN) 2 % Apply 1 application topically daily.       . [DISCONTINUED] omeprazole (PRILOSEC) 20 MG capsule TAKE 1 CAPSULE BY MOUTH TWICE A DAY 30 MINUTES BEFORE MEALS  60 capsule  6   No facility-administered encounter medications on file as of 07/26/2013.    Allergies  Allergen Reactions  . Amiodarone     Severe side effects per Pt--  . Atorvastatin     REACTION: muscle pain  . Codeine     REACTION: itching  . Crestor [Rosuvastatin] Other (See Comments)    Muscle weakness  . Erythromycin Other (See Comments)  unknown  . Ezetimibe     REACTION: INTOL to Zetia w/ cough  . Meperidine Hcl Other (See Comments)    REACTION: dizziness  . Morphine Other (See Comments)    Pt states it "makes her crazy"  . Neomycin-Bacitracin Zn-Polymyx Other (See Comments)    blisters  . Ropinirole Hydrochloride     REACTION: INTOL to Requip w/ sleep paralysis  . Simvastatin     REACTION: unable to walk--muscle pain    Current Medications, Allergies, Past Medical History, Past Surgical History, Family History, and Social History were reviewed in [employer] record.    Review of Systems         See HPI - all other systems neg except as noted...  The patient complains of decreased hearing, dyspnea on exertion, and peripheral edema.  The patient denies anorexia, fever, weight loss, weight gain, vision loss, hoarseness, chest pain, syncope, prolonged cough, headaches, hemoptysis, abdominal pain, melena,  hematochezia, severe indigestion/heartburn, hematuria, incontinence, muscle weakness, suspicious skin lesions, transient blindness, difficulty walking, depression, unusual weight change, abnormal bleeding, enlarged lymph nodes, and angioedema.    Objective:   Physical Exam    WD, Thin, 78 y/o WF in NAD... GENERAL:  Alert & oriented; pleasant & cooperative... HEENT:  [place]/AT, EOM-wnl,  EACs-clear, TMs-wnl, NOSE-clear, THROAT-clear & wnl... NECK:  Supple w/ fairROM; no JVD; normal carotid impulses w/o bruits; no thyromegaly or nodules palpated; no lymphadenopathy. CHEST:  Median sterotomy scar, decr BS at bases w/ few bibasilar rales... HEART:  Regular Rhythm;  gr 1/6 sys murmur, without rubs or gallops detected... s/p repair ASD & Ao dissection... ABDOMEN:  Soft & nontender; normal bowel sounds; no organomegaly or masses detected. EXT: without deformities, mild arthritic changes; no varicose veins/ +venous insuffic/ tr+ edema. NEURO:  CN's intact;  peripheral neuropathy without focal neuro deficits on exam... DERM:  Few patches of papules/vesicles with linear excoriations along right upper torso , lower left back and right anterior shin/knee.   RADIOLOGY DATA:  Reviewed in the EPIC EMR & discussed w/ the patient...  LABORATORY DATA:  Reviewed in the EPIC EMR & discussed w/ the patient...   Assessment & Plan:

## 2013-07-26 NOTE — Assessment & Plan Note (Signed)
Suspect is due to aging skin, ASA and recent cortisone injection  Will check cbc to evaluate hbg/plt. To r/o occult cause .

## 2013-07-26 NOTE — Patient Instructions (Addendum)
Apply Cortisone cream to area Twice daily  For 7 days-do not use on face or neck.  Cool compresses, avoid hot showers.  May use Zyrtec 10mg  daily As needed itching for 5 days.  I will call with labs  Flu shot today  follow up Dr. Kriste Basque  As planned and As needed

## 2013-07-26 NOTE — Assessment & Plan Note (Signed)
Suspect is secondary to contact dermatitis ? Poison ivy exposure   Plan  Apply Cortisone cream to area Twice daily  For 7 days-do not use on face or neck.  Cool compresses, avoid hot showers.  May use Zyrtec 10mg  daily As needed itching for 5 days.  Flu shot today  follow up Dr. Kriste Basque  As planned and As needed

## 2013-08-02 NOTE — Progress Notes (Signed)
Quick Note:  LMOM TCB x1. ______ 

## 2013-08-03 NOTE — Progress Notes (Signed)
Quick Note:  Patient returned call. Advised of lab results / recs as stated by TP. Pt verbalized understanding and denied any questions. ______ 

## 2013-09-03 ENCOUNTER — Other Ambulatory Visit: Payer: Self-pay | Admitting: Pulmonary Disease

## 2013-09-05 ENCOUNTER — Telehealth: Payer: Self-pay | Admitting: Pulmonary Disease

## 2013-09-05 NOTE — Telephone Encounter (Signed)
Called pt and advised that refills have been called into the pharmacy

## 2013-09-12 ENCOUNTER — Ambulatory Visit: Payer: Medicare Other | Admitting: Cardiovascular Disease

## 2013-10-02 ENCOUNTER — Encounter: Payer: Self-pay | Admitting: Cardiovascular Disease

## 2013-10-02 ENCOUNTER — Ambulatory Visit (INDEPENDENT_AMBULATORY_CARE_PROVIDER_SITE_OTHER): Payer: Medicare Other | Admitting: Cardiovascular Disease

## 2013-10-02 VITALS — BP 118/68 | HR 56 | Ht 67.0 in | Wt 158.0 lb

## 2013-10-02 DIAGNOSIS — I2510 Atherosclerotic heart disease of native coronary artery without angina pectoris: Secondary | ICD-10-CM

## 2013-10-02 DIAGNOSIS — I7101 Dissection of thoracic aorta: Secondary | ICD-10-CM

## 2013-10-02 DIAGNOSIS — I4891 Unspecified atrial fibrillation: Secondary | ICD-10-CM

## 2013-10-02 LAB — BASIC METABOLIC PANEL
BUN: 29 mg/dL — ABNORMAL HIGH (ref 6–23)
CO2: 33 mEq/L — ABNORMAL HIGH (ref 19–32)
Chloride: 97 mEq/L (ref 96–112)
Creatinine, Ser: 0.9 mg/dL (ref 0.4–1.2)
GFR: 69.11 mL/min (ref >60.00)
Glucose, Bld: 126 mg/dL — ABNORMAL HIGH (ref 70–99)
Potassium: 4.2 mEq/L (ref 3.5–5.1)

## 2013-10-02 NOTE — Progress Notes (Signed)
HPI:   [name] returns for followup evaluation. She is a delightful 78 year old woman with a history of remote surgical repair of an atrial septal defect. In 2011 she presented with a type A aortic dissection and underwent emergent surgical repair. She was treated with multivessel CABG at that time as well. The patient has also been followed for atrial fibrillation, severe tricuspid regurgitation, and chronic congestive heart failure. She unfortunately could not tolerate warfarin because of severe recurrent epistaxis requiring ENT surgeries.  The patient is doing well. She continues to walk for exercise but has had to back off because of knee problems. She is limited primarily by osteoarthritis with pain in both knees and hips as well as left shoulder and back. She is able to walk for about 1 mile per day, sometimes without a rest. She's had some palpitations and being she's had episodes of atrial fib but nothing prolonged. She denies chest pain or pressure, lightheadedness, or syncope. Other complaints include balance problems and leg cramps at night.  Outpatient Encounter Prescriptions as of 10/02/2013  Medication Sig  . aspirin EC 81 MG tablet Take 1 tablet (81 mg total) by mouth daily.  . cholecalciferol (VITAMIN D) 1000 UNITS tablet Take 1,000 Units by mouth daily.   . clorazepate (TRANXENE) 7.5 MG tablet TAKE 1 TABLET THREE TIMES A DAY  . docusate sodium (COLACE) 50 MG capsule Take 50 mg by mouth 2 (two) times daily.  Marland Kitchen gabapentin (NEURONTIN) 300 MG capsule TAKE 2 CAPSULES BY MOUTH AT BEDTIME  . glucosamine-chondroitin 500-400 MG tablet Take 1 tablet by mouth 2 (two) times daily.  Marland Kitchen HYDROcodone-acetaminophen (NORCO/VICODIN) 5-325 MG per tablet TAKE 1 TABLET EVERY 8 HOURS AS NEEDED FOR PAIN  . metoprolol tartrate (LOPRESSOR) 25 MG tablet TAKE 0.5 TABLETS (12.5 MG TOTAL) BY MOUTH 2 (TWO) TIMES DAILY.  Marland Kitchen omeprazole (PRILOSEC) 20 MG capsule TAKE 1 CAPSULE BY MOUTH TWICE A DAY 30 MINUTES  BEFORE MEALS  . Polyethylene Glycol 3350 (MIRALAX PO) Take by mouth at bedtime.  . potassium chloride SA (K-DUR,KLOR-CON) 20 MEQ tablet TAKE 1 TABLET (20 MEQ TOTAL) BY MOUTH DAILY.  Marland Kitchen sertraline (ZOLOFT) 50 MG tablet TAKE 1 TABLET BY MOUTH EVERY MORNING  . spironolactone (ALDACTONE) 25 MG tablet TAKE 1 TABLET (25 MG TOTAL) BY MOUTH DAILY.  Marland Kitchen TIKOSYN 250 MCG capsule TAKE ONE CAPSULE BY MOUTH EVERY 12 HOURS  . torsemide (DEMADEX) 20 MG tablet TAKE 3 TABLETS (60 MG TOTAL) BY MOUTH DAILY.  . traMADol (ULTRAM) 50 MG tablet TAKE 1 TABLET (50 MG TOTAL) BY MOUTH 3 (THREE) TIMES DAILY AS NEEDED. FOR PAIN  . vitamin C (ASCORBIC ACID) 500 MG tablet Take 500 mg by mouth daily.  Marland Kitchen senna-docusate (SENOKOT-S) 8.6-50 MG per tablet Take 1 tablet by mouth 2 (two) times daily.  . traMADol (ULTRAM) 50 MG tablet TAKE 1 TABLET (50 MG TOTAL) BY MOUTH 3 (THREE) TIMES DAILY AS NEEDED. FOR PAIN    Allergies  Allergen Reactions  . Amiodarone     Severe side effects per Pt--  . Atorvastatin     REACTION: muscle pain  . Codeine     REACTION: itching  . Crestor [Rosuvastatin] Other (See Comments)    Muscle weakness  . Erythromycin Other (See Comments)    unknown  . Ezetimibe     REACTION: INTOL to Zetia w/ cough  . Meperidine Hcl Other (See Comments)    REACTION: dizziness  . Morphine Other (See Comments)    Pt states  it "makes her crazy"  . Neomycin-Bacitracin Zn-Polymyx Other (See Comments)    blisters  . Ropinirole Hydrochloride     REACTION: INTOL to Requip w/ sleep paralysis  . Simvastatin     REACTION: unable to walk--muscle pain    Past Medical History  Diagnosis Date  . OSA (obstructive sleep apnea)   . HTN (hypertension)   . Atrial fibrillation     paroxysmal; on coumadin  . CAD (coronary artery disease)     a. cath 7/11: LM 40%, mild plaque disease in CFX, LAD, and RCA;  b. 2011 CABG with S-LAD and S-OM done at time of Aortic dissection repair  . Chronic diastolic CHF (congestive heart  failure)     a. 08/2011 Echo: EF 55-60%, PASP  . Dissection of aorta, thoracic     a. Type A; s/p repair 7/11 with aortic root repair and CABG x 2   . ASD (atrial septal defect)     a. s/p repair 1982 at [facility]  . Right heart failure     a. 2/2 TR and RV dysfxn;  b. echo 4/12: EF 60%, mild LVH, mild AI, mild MR, mod LAE, mod RVE with mod dec. RVSF, mod RAE, mod to severe TR, PASP 58;  c. right heart cath 4/12:  RA mean 8, RV 46/1 with mean 6, PA 45/13 with mean 26, PCWP mean 14, CO 3.68, CI 2.1 (no sig pulmon HTN)  . GERD (gastroesophageal reflux disease)   . IBS (irritable bowel syndrome)   . Esophageal stricture   . Colonic polyp   . Peripheral neuropathy   . Back pain   . Fibromyalgia   . Anxiety   . HLD (hyperlipidemia)   . Borderline diabetes   . History of TIAs   . DJD (degenerative joint disease)   . Normocytic anemia   . Thrombocytopenia   . Macular degeneration   . Depression   . Esophageal dysmotilities     ROS: Negative except as per HPI  BP 118/68  Pulse 56  Ht 5\' 7"  (1.702 m)  Wt 158 lb (71.668 kg)  BMI 24.74 kg/m2  PHYSICAL EXAM: Pt is alert and oriented, pleasant woman in NAD HEENT: normal Neck: JVP - prominent V waves, carotids 2+= without bruits Lungs: CTA bilaterally CV: RRR with grade 3/6 systolic murmur at the left sternal border Abd: soft, NT, Positive BS, no hepatomegaly Ext: no C/C/E, distal pulses intact and equal Skin: warm/dry no rash  EKG:  Sinus bradycardia 56 beats per minute, left ventricular hypertrophy, otherwise within normal limits.  2-D echocardiogram 05/04/2013: Study Conclusions  - Left ventricle: The cavity size was normal. Wall thickness was increased in a pattern of mild LVH. Systolic function was normal. The estimated ejection fraction was in the range of 55% to 60%. Wall motion was normal; there were no regional wall motion abnormalities. Features are consistent with a pseudonormal left ventricular filling pattern,  with concomitant abnormal relaxation and increased filling pressure (grade 2 diastolic dysfunction). Doppler parameters are consistent with high ventricular filling pressure. - Aortic valve: Mild regurgitation. - Mitral valve: Calcified annulus. Mild prolapse, involving the anterior leaflet. Mild regurgitation. - Left atrium: The atrium was moderately dilated. - Right ventricle: The cavity size was mildly dilated. Systolic function was mildly reduced. - Right atrium: The atrium was moderately dilated. - Tricuspid valve: Severe regurgitation. - Pulmonary arteries: Systolic pressure was severely increased. PA peak pressure: 71mm Hg (S). Impressions:  - There appears to be a linear  density in ascending aorta; possibly related to previous repair of aortic dissection; suggest CTA or MRA if clinically indicated.  CT angiogram of the chest 05/11/2013: IMPRESSION: Ascending aortic repair is stable. Dissection extension into the innominate and subclavian artery is stable. There has been increasing thrombosis of one of the lumens of the dissection and extension into the axillary artery with narrowing of the true lumen. This is estimated at 70% narrowing. Right and left upper extremity systolic blood pressure measurements are recommended. If a large gradient is present, underlying significant narrowing secondary to the dissection would be suggested.  There are stable pulmonary nodules in the left lung but there is a new 6 mm right lung nodule. If the patient is at high risk for bronchogenic carcinoma, follow-up chest CT at 6-12 months is recommended. If the patient is at low risk for bronchogenic carcinoma, follow-up chest CT at 12 months is recommended. This recommendation follows the consensus statement: Guidelines for Management of Small Pulmonary Nodules Detected on CT Scans: A Statement from the Fleischner Society as published in Radiology 2005; 237:395-400.  Stable thoracic  lateral meningoceles. This finding can be associated with Neurofibromatosis, Ehler-Danlos syndrome, and Marfan's syndrome. Genetic workup can be performed to further characterize.   Original Report Authenticated By: Jolaine Click, M.D.  ASSESSMENT AND PLAN: 1. Paroxysmal atrial fibrillation. The patient is stable and maintaining sinus rhythm on antiarrhythmic therapy. She's had some epistaxis even on no anticoagulant drugs. Will continue current management. EKG shows a normal QTc on Tikosyn.   2. Chronic diastolic heart failure/Severe tricuspid regurgitation. Stable on medical therapy with chronic diuretic use.  3. Coronary artery disease. No anginal symptoms. She was treated with CABG at the time of type A aortic dissection repair.  4. Aortic dissection, type A. Stable findings on CT angiography. Followed by Dr. Cornelius Moras on a yearly basis.   [name] 10/04/2013 10:47 PM

## 2013-10-02 NOTE — Patient Instructions (Signed)
Your physician recommends that you have lab work done today . [facility],[place]  Your physician has requested that you have an echocardiogram in 6 months. Echocardiography is a painless test that uses sound waves to create images of your heart. It provides your doctor with information about the size and shape of your heart and how well your heart's chambers and valves are working. This procedure takes approximately one hour. There are no restrictions for this procedure.  Your physician wants you to follow-up in 6 months with Dr Excell Seltzer. You will receive a reminder letter in the mail two months in advance. If you don't receive a letter, please call our office to schedule the follow-up appointment.  Your physician recommends that you continue on your current medications as directed. Please refer to the Current Medication list given to you today.

## 2013-10-04 ENCOUNTER — Encounter: Payer: Self-pay | Admitting: Cardiovascular Disease

## 2013-10-10 ENCOUNTER — Ambulatory Visit: Payer: Medicare Other | Admitting: Cardiovascular Disease

## 2013-10-15 ENCOUNTER — Other Ambulatory Visit: Payer: Self-pay | Admitting: Cardiovascular Disease

## 2013-10-17 ENCOUNTER — Ambulatory Visit (INDEPENDENT_AMBULATORY_CARE_PROVIDER_SITE_OTHER): Payer: Medicare Other | Admitting: Pulmonary Disease

## 2013-10-17 ENCOUNTER — Ambulatory Visit (INDEPENDENT_AMBULATORY_CARE_PROVIDER_SITE_OTHER)
Admission: RE | Admit: 2013-10-17 | Discharge: 2013-10-17 | Disposition: A | Discharge status: Home / Self Care | Payer: Medicare Other | Source: Ambulatory Visit | Attending: Pulmonary Disease | Admitting: Pulmonary Disease

## 2013-10-17 ENCOUNTER — Encounter: Payer: Self-pay | Admitting: Pulmonary Disease

## 2013-10-17 VITALS — BP 118/66 | HR 58 | Temp 97.5°F | Ht 67.0 in | Wt 158.0 lb

## 2013-10-17 DIAGNOSIS — M1990 Unspecified osteoarthritis, unspecified site: Secondary | ICD-10-CM

## 2013-10-17 DIAGNOSIS — I872 Venous insufficiency (chronic) (peripheral): Secondary | ICD-10-CM

## 2013-10-17 DIAGNOSIS — I7101 Dissection of thoracic aorta: Secondary | ICD-10-CM

## 2013-10-17 DIAGNOSIS — I10 Essential (primary) hypertension: Secondary | ICD-10-CM

## 2013-10-17 DIAGNOSIS — R269 Unspecified abnormalities of gait and mobility: Secondary | ICD-10-CM

## 2013-10-17 DIAGNOSIS — R0600 Dyspnea, unspecified: Secondary | ICD-10-CM

## 2013-10-17 DIAGNOSIS — IMO0001 Reserved for inherently not codable concepts without codable children: Secondary | ICD-10-CM

## 2013-10-17 DIAGNOSIS — F341 Dysthymic disorder: Secondary | ICD-10-CM

## 2013-10-17 DIAGNOSIS — I4891 Unspecified atrial fibrillation: Secondary | ICD-10-CM

## 2013-10-17 DIAGNOSIS — G609 Hereditary and idiopathic neuropathy, unspecified: Secondary | ICD-10-CM

## 2013-10-17 DIAGNOSIS — E7800 Pure hypercholesterolemia, unspecified: Secondary | ICD-10-CM

## 2013-10-17 DIAGNOSIS — M545 Low back pain: Secondary | ICD-10-CM

## 2013-10-17 DIAGNOSIS — I5033 Acute on chronic diastolic (congestive) heart failure: Secondary | ICD-10-CM

## 2013-10-17 DIAGNOSIS — R7309 Other abnormal glucose: Secondary | ICD-10-CM

## 2013-10-17 DIAGNOSIS — I2510 Atherosclerotic heart disease of native coronary artery without angina pectoris: Secondary | ICD-10-CM

## 2013-10-17 DIAGNOSIS — F419 Anxiety disorder, unspecified: Secondary | ICD-10-CM

## 2013-10-17 MED ORDER — GABAPENTIN 300 MG PO CAPS: ORAL_CAPSULE | ORAL | Status: DC

## 2013-10-17 MED ORDER — SPIRONOLACTONE 25 MG PO TABS: ORAL_TABLET | ORAL | Status: DC

## 2013-10-17 MED ORDER — TRAMADOL HCL 50 MG PO TABS: ORAL_TABLET | ORAL | Status: DC

## 2013-10-17 MED ORDER — OMEPRAZOLE 20 MG PO CPDR: DELAYED_RELEASE_CAPSULE | ORAL | Status: DC

## 2013-10-17 MED ORDER — TORSEMIDE 20 MG PO TABS: ORAL_TABLET | ORAL | Status: DC

## 2013-10-17 MED ORDER — DOFETILIDE 250 MCG PO CAPS: ORAL_CAPSULE | ORAL | Status: DC

## 2013-10-17 MED ORDER — CLORAZEPATE DIPOTASSIUM 7.5 MG PO TABS: ORAL_TABLET | ORAL | Status: DC

## 2013-10-17 MED ORDER — SERTRALINE HCL 50 MG PO TABS: ORAL_TABLET | ORAL | Status: DC

## 2013-10-17 MED ORDER — METOPROLOL TARTRATE 25 MG PO TABS: ORAL_TABLET | ORAL | Status: DC

## 2013-10-17 MED ORDER — POTASSIUM CHLORIDE CRYS ER 20 MEQ PO TBCR: EXTENDED_RELEASE_TABLET | ORAL | Status: DC

## 2013-10-17 NOTE — Patient Instructions (Signed)
Today we updated your med list in our EPIC system...    Continue your current medications the same...    We refilled your meds per request...   Today we did a follow up CXR... Please return to our lab one morning soon for your FASTING blood work...    We will contact you w/ the results when available...   Keep up the good work w/ your diet & exercise program...  Call for any questions...  Let's plan a follow up visit in 75mo, sooner if needed for problems.Marland KitchenMarland Kitchen

## 2013-10-17 NOTE — Progress Notes (Addendum)
Subjective:    Patient ID: [name], female    DOB: September 20, 1935, 78 y.o.   MRN: [id number]  HPI 78 y/o WF here for a follow up visit... she has multiple medical problems as noted below...     Followed by DrCooper for Cards- hx ASD repair 1982 & dilated AoRoot;  HBP & PAF;  then Aortic dissection w/ emergency surg 7/11 by DrOwen w/ CABG x2 and miraculous recovery...   Followed by DrDBrodie for GI- hx dysphagia & esoph strictures dilated, also hx candida esoph... also prob w/ constip part related to rectocele...   Followed by [facility] & ENT at [facility] for severe epistaxis requiring mult surg in 2013 (see below)...  ~  September 04, 2010:  She developed sudden left sided back & chest pain 06/04/10 w/ signs of ischemia & taken to cath lab w/ AoDissection found along w/ Lmain disease- 11hr emergency operation by DrOwen w/ repair of aortic dissection & CABG x2 (SVG to LAD, & SVG to obtuse marg branch of Circ) was required to get her off the bypass machine... she made a miraculous recovery- followed closely by DrOwen & DrCooper w/ meds as below... getting stonger w/ home health rehab, etc... some recent incr trouble w/ fluid retention & they have adjusted diuretics... XRays & labs reviewed... I have offered assist in any way needed- she requests refill Rxs to [facility].  ~  September 07, 2011:  1 year ROV & she has had a lot going on >> long prob list & multisystem disease w/ mult specialists involved in her care...    Abn CXR> s/p AscAo dissection repair & bilat apical schwannomas (see CXR & CTA report); O2sat= 91% on RA & advised incr exercise at home...    HBP> on Metoprolol50Bid, Lasix80Bid, Zaroxyln2.5 2d/wk, K20Tid- ;  BP= 114/68 & labs showed K= 3.2; advised incr K20 to 2Bid everyday...    CARDIAC> followed by DrCooper & DrOwens w/ long complic cardiac hx> ASD secundum repair at [facility], CAD, PAF, Ac on Chr Diastolic CHF, & 7/11 Asc ThorAo Dissection w/ severe AI & Lmain lesion==> s/p  repair of dissecting aneurysm & CABGx2 [id number]... She knows to take Amox before dental work.    PAF> on ASA81 + Coumadin in CC, off Amio, followed by DrCooper for Cards    CHOL> on Crestor5 & her FLP looks good (see below)...    DM> borderline DM on diet alone & labs look good; continue diet + exercise...    Hx esophagitis & stricture> followed by DrDBrodie on Prilosec20Bid; last EGD 3/11ndida (Rx'd); denies heartburn, pain, dysphagia, etc...    IBS w/ constip & Rectocele> also followed by DrGottsegen; on stool softeners but she has trouble w/ evacuation & they are aware...    Left flank discomfort> she also sees Urology (prev DrKimbrough) & has f/u appt pending...    DJD/ LBP/ FM> she is actually c/o "corns" on her feet that make walking difficult she says; she will try OTC pads & if not improved she will call Podiatry...    Neuropathy> followed by Autumn Patty on Neurontin300Bid & Tramadol Prn...    Anxiety/ Depression> on Zoloft50, Tranxene7.5 Prn (ave~1/d she says), & [id number];   ~  March 07, 2012:  65mo ROV & [name] has been thru a lot since last OV>>    Hx Aortic replacement & AFib prev on Coumadin> Feb2013 developed severe epistaxis:    Treated for bilat severe epistaxis 12/27/11 w/ bilat endoscopic sphenopalatine ligation & packing by Talmage Coin  at [facility]; packing removed 2/18 by DrShoemaker in his office...    Anemic due to acute blood loss> prev Hg= 10-11 range & was 7.7 when Tx to [facility]...    Re-adm 2/28 - 01/14/12 for recurrent bleeding from right nares- packed & attempted embolization was unsuccessful; transferred to [facility] by [facility] for further surg...    She had further surg by ENT at [facility] but we don't have any of these records or f/u note by ENT in [place]... She has had mult f/u visits w/ DrCooper for Cards> back on Coumadin for her AFib (intol to Amio & Coumadin carefully monitored by CC), right heart failure w/ severe TR & no surg option, extremely fatigued/ exhaused/ doing very poorly  overall & she feels she has not recovered from the ENT surg/ nose bleeding episodes... CXR 4/13 showed chr severe cardiomeg, prev CABG, chr pleural blunting but no acute effusions, post-op right axillary changes, NAD... LABS 4/13:  Chems- ok w/ K=4.0 BUN=42 Creat=1.1 BS=122 A1c=7.1;  CBC- Hg=9.1 Fe=18 (4%);  TSH=4.49;  B12=849;  PROTIME= WAY TOO THIN & referred to CC- stat...  ~  Apr 05, 2012:  18mo ROV> [name] remains very weak & is having difficulty at home- fell in [place] yest when husb was out, couldn't stand, crawled to cellphone & husb ret to help her up, bruised right lat ribs & left hip area (XRays today are neg);  She notes her walking is worse since her last surg & we discussed the need for PHYSICAL THERAPY- walking, strengthening, balance;  She has Neuro f/u w/ DrLove pending;  "My goal is to get in my car and GO and DO!" she says...    She saw DrCooper for Cards f/u earlier this month & there doesn't look like there is much else he can do;  F/u CTA 5/13 showed stable prox Ao graft- see extensive report by DrYamagata (reviewed)...    She saw DrFontaine for GYN f/u- c/o labial swelling & tenderness- infected seb cyst, drained, Rx Doxy Bid, & improved... CXR 5/13 showed stable cardiomegaly & median sternotomy, atherosclerotic calcif in AO, clear lungs, osteopenia, NAD... Right RIB FILMS are neg for any fractures... Left HIP FILM is neg for fx, pos for osteopenia... LABS 5/13:  Chems- ok x HCO3=37;  CBC- improved w/ Hg=11.1 Fe=132 (38%)  ~  June 12, 2012:  58mo ROV & post hosp check> [name] is now [facility] having been placed on Tikosyn 6/13 Hosp for AFib/ CHF/ weakness; she converted to NSR, Lasix changed to Demadex/ Aldactone & Cards is watching very carefully;  She has been walking >62mi daily again;  She is also back on Coumadin via CC w/ only minor epistaxis noted...  She saw DrCooper 6/13 post-hosp check> doing well onTikosyn 250mg  Bid, Metoprolol 12.5mg  bid, Demadex 20mg -3tabs daily,  Aldactone25mg /d, K20/d, and Coumadin via clinic...    We reviewed prob list, meds, xrays and labs> see below for updates >> CXR 6/13 showed s/p median sternotomy, cardiomeg, tort Ao, left base scarring/ atx, NAD... LABS 6/13 Hosp reviewed...  ~  Apr 11, 2013:  33mo ROV & [name] had recurrent epistaxis 3/14> Hosp overnight at [facility] & DrCooper decided to STOP her Coumadin (holding NSR w/ the Tikosyn) and she improved but has persistent nasal congestion, bloody drainage, etc; review of EPIC records also indicates +for MRSA on screening; We decided to Rx w/ MUCINEX600mg -2Bid, Fluids, NASAL SALINE Q1h, & 7d course of KEFLEX 500mg Tid to see if this improves.Marland KitchenMarland Kitchen  Recurrent Epistaxis>  As above... She was [facility] for another severe epistaxis episode 3/14; still has bloody crusty drainage in nose- Rx w/ SALINE, Mucinex, 7d course of KEFLEX (hx +MRSA noted),,,    Abn CXR> s/p AscAo dissection repair & bilat apical schwannomas (see CXR & CTA report); CXR 6/13 showed cardiomeg, median sternotomy, tort Ao, basilar scarring/ Atx; O2sat= 98% on RA & advised incr exercise at home...    HBP> on Metoprolol25-1/2Bid, Demadex20-3/d, Aldactone25, K20;  BP= 108/80 & labs showed K= 4.1    CARDIAC> followed by DrCooper & DrOwens w/ long complic cardiac hx> ASD secundum repair at [facility] 1982, CAD, PAF, Ac on Chr Diastolic CHF, & 7/11 Asc ThorAo Dissection w/ severe AI & Lmain lesion==> s/p repair of dissecting aneurysm & CABGx2 [id number]; She knows to take Amox before dental work; she saw DrCooper 2/14 & again 3/14 in [place] for Epistaxis where they decided to STOP the Coumadin due to the bleeding & the fact that she was maintaining NSR on Tikosyn...    PAF> on Tikosyn250Bid + off Coumadin now;  followed by DrCooper for Cards & holding NSR w/o CP, palpit, etc...    CHOL> on diet alone now & off prev Crestor5; last FLP was 2012 & it needs repeat on diet alone...    DM> borderline DM on diet alone & labs 5/14 showed BS= 102,  A1c=5.9    Hx esophagitis & stricture> followed by DrDBrodie on Prilosec20prn; last EGD 3/11 showed candida (Rx'd); denies heartburn, pain, dysphagia, etc...    IBS w/ constip & Rectocele> also followed by DrGottsegen; on stool softeners but she has trouble w/ evacuation & they are aware...    Left flank discomfort> likely musculoskeletal- take tramadol/ Vicodin; she also sees Urology & was checked by DrDahlstedt & PA 3/14> Urine was clear...    DJD/ LBP/ FM> she is stable on Tramadol & Vicodin as needed for pain; mult prev MRI's w/ paraspinal meningoceles, hemangiomas, perineural cysts- checked by DrStern 2/14 & felt to be benign & incidental no further eval warranted...    Neuropathy> followed by Autumn Patty (last seen 11/13) on Neurontin300-2Qhs & Tramadol Prn...    Anxiety/ Depression> on Zoloft50, Tranxene7.5 Prn (ave~1/d she says); they are under incr stress w/ son Lynden Ang) & daugh (divorce)... We reviewed prob list, meds, xrays and labs> see below for updates >>  LABS 5/14:  Chems- wnl w/ BS=102 A1c=5.9 Cr=1.0;  CBC- ok w/ Hg=12.8   ~  October 17, 2013:  48mo ROV & [name] has several areas of concern today>    C/o sinus drainage & we reviewed OTC meds including Antihist & nasal saline mist as needed...    AFib> on Tikosyn250Bid + Metop25-1/2Bid; she had some breakthrough episodes but nothing prolonged & DrCooper is aware- they decided to continue same meds & watch her closely...    Ortho> mult complaints including both knees, right hip, left shoulder w2/ several shots by DrCollins (DrCooper does not want her to have surg), and she has Tramadol & Vicodin as needed for pain...    Anxiety- she notes 4+ stress w/ family issues this yr (daugh divorsed, remarried, DWI, children, grandchildren acting out etc); on Zoloft50 & Tranxene as needed... 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 She wants to return for FASTING blood work=> done 10/24/13=>  FLP- not at goals w/ TChol=213 LDL=157 off med &  [facility];  Chems- ok w/ BS=110 A1c=5.9 on diet alone;  CBC- wnl;  TSH=1.82;  BNP=287...            Problem List:  SEVERE EPISTAXIS >> see above & managed by DrBates et al + ENT at [facility]=> Dr Eliezer Lofts treated w/ max med Rx w/ Ab for 3wks, nasal saline spray Tid, etc; f/u CT sinuses was improved, perforation in nasal septum noted & they offered surg repair but she is holding off...  OBSTRUCTIVE SLEEP APNEA (ICD-327.23) - sleep study 9/06 by Breck Coons showed RDI=25/hr during REM w/ desat to 77%...  ABN CXR w/ left superior mediastinal opacity ?etiology- no change back to 2006 films= benign... ~  CXR 2/11 showed cardiomeg, ectatic Ao, left superior mediast soft tissue prom w/o change (?ectasia of great vessels or benign mass). ~  CXR 9/11 s/p median sternotomy, dissection repair,  & CABG- cardiomeg, bilat effusions & basilar atx, no ch in left superior lesion... ~  CXR 4/12 showed med sternotomy, cardiomeg, hyerinflation/ scarring, some vasc congestion... ~  CTAngio 5/12 showed stable asc ao dissection repair, rounded soft tissues masses both apicies L>R are prob neural tumors (schwannomas)... ~  CXR 4/13 & 5/13 showed chr severe cardiomeg, prev CABG, chr pleural blunting but no acute effusions, post-op right axillary changes, NAD.Marland Kitchen. ~  CTAngio 5/13 showed stable appearance in the chest w/ mult findings (SEE REPORT); stable appearance in the abd as well (SEE REPORT).Marland Kitchen. ~  CXR 12/14 showed cardiomegaly, chr changes, left medial apical lesion= meningocele on prev CT, NAD...  HYPERTENSION (ICD-401.9) >>  ~  controlled on METOPROLOL 50mg Bid, + LASIX/ ZAROXYLN/ KCl...  ~  10/12:  BP=110/70 and she denies HA, visual changes, CP, palipit, syncope... she is fatigued, SOB/ DOE, & +edema... getting PT/ rehab therapy & improving slowly... ~  4/13:  BP= 100/70 & she is very  weak s/p epistaxis episodes 2/13 & 3/13 w/ surg here & at [facility]... ~  5/13:  BP= 100/70 & she is very weak... ~  7/13:  BP= 120/74 & she is feeling much better in NSR on Tikosyn... ~  11/13: BP= 102/60 & she denies CP, palpit, ch in SOB, edema, etc... ~  5/14: on Metoprolol25-1/2Bid, Demadex20-3/d, Aldactone25, K20;  BP= 108/80 & labs showed K= 4.1 ~  12/14: on Metoprolol25-1/2Bid, Demadex20-3/d, Aldactone25, K20;  BP= 118/66 & she remains largely asymptomatic...  ATRIAL FIBRILLATION (ICD-427.31) - on COUMADIN followed in the CC and very carefully monitored due to her severe epistaxis; managed by DrCooper for Cards & she is intol to Amiodarone... ~  HOSP 6/13 by Cards & much improved in NSR on TIKOSYN 250mg Bid, DEMADEX20mg - 3/d, ALDACTONE25mg /d, KCl 20/d; and COUMADIN via CC...  CORONARY ARTERY DISEASE (ICD-414.00) - off ASA due to severe epistaxis episodes.. ACUTE ON CHRONIC DIASTOLIC HEART FAILURE (ICD-428.33) - prev on Lasix, Zaroxylyn, & KCl; Hosp 6/13 for Tikosyn Rx & much improved in NSR on this & Demadex/ Aldactone/ KCl/ Coumadin... 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 Hx of ATRIAL SEPTAL DEFECT (ICD-745.5) - s/p ASD secundum repaired at [facility] in 1982... long-time pt of DrJoeLeBauer and now DrCooper> ~  Cardiolite 5/06 showed no ischemia & EF=65%...  ~  2DEcho 12/07 showed norm LVF, EF=55%, no regional wall motion abn, & RA/RV= mod dil... ~  Cardiac MRI 3/09 showed mod Ao root enlargement 4.3cm, norm AoV/ Arch/ Desc Ao, mod RV & biatrial enlargement from the prev ASD repair (no resid leak), mod  PA enlargement, norm LV w/ EF=59%... ~  [facility] 9/09 w/ MI ruled-out, atyp CP (prob esoph spasm) & Myoview was neg- no scar or ischemia, EF= 77%... ~  2DEcho 9/10 showed sl incr LV wall thikness c/w mild LVH, norm wall motion, EF= 55-60%, gr2 DD, mild AI, mod dil LA/RA, mod-severe TR, mild pulmHTN... ~  Emergency hosp 7/11 w/ Aortic dissection, severe AI,  severe TR & cath w/ 40%Lmain lesion- s/p repair & required CABG x2 as well... ~  DrOwen does CTAngiograms every 30mo... ~  [facility] 4/12 by Cards for CHF from severe TR & right heart disease> SEE 2DEcho & Right heart cath data> reviewed... ~  She saw DrCooper 10/12> felt to be reasonably stable, holding NSR, on Coumadin via CC, & f/u 2DEcho ordered & pending... ~  Labs 10/12 showed K=3.2 on K20Tid+4MTh; TCO2=37, BUN=28, Creat=0.8; pt instructed to incr K20 to [facility] everyday... ~  Serial labs avail in the results section... Labs 5/13 showed BUN=21, Creat=0.8, K=3.9 ~  HOSP 6/13 by Cards & much improved in NSR on TIKOSYN 250mg Bid, DEMADEX20mg - 3/d, ALDACTONE25mg /d, KCl 20/d; and COUMADIN via CC...  VENOUS INSUFFICIENCY (ICD-459.81) & EDEMA (ICD-782.3) - she has chr VI changes, superficial VV, and incr edema since surg 7/11... ~  3/09 ABI's done and were WNL...  HYPERCHOLESTEROLEMIA (ICD-272.0) - on CRESTOR 5mg Qod & she wants to STOP for awhile; she has blamed Zetia for cough in the past... ~  FLP 7/08 showing TChol 143, TG 173, HDL 32, LDL 76. ~  FLP 4/09 showed TChol 172, Tg 161, HDL 38, LDL 102 ~  FLP 7/09 showed TChol 167. Tg 148, HDL 40, LDL 98... rec- continue same. ~  FLP 1/10 showed TChol 177, TG 85, HDL 43, LDL 117 ~  FLP 2/11 showed TChol 160, TG 83, HDL 50, LDL 94 ~  FLP 10/12 on Cres5 showed TChol 150, TG 81, HDL 52, LDL 82 ~  4/13:  She wants to STOP the Cres5Qod for awhile since she is so weak... ~  5/14:  She needs to ret FASTING for FLP on diet alone...  DIABETES MELLITUS, BORDERLINE (ICD-790.29) - on diet Rx alone... ~  Hx borderline BS w/ FBS=119 in 7/08 & HgA1c=6.1 on diet alone. ~  labs 4/09 showed BS= 120, HgA1c= 6.0.Marland KitchenMarland Kitchen continue diet rx. ~  labs 7/09 showed BS= 132 ~  labs 1/10 showed BS= 120, A1c= 6.1 ~  labs 2/11 showed BS= 106 ~  Labs 10/12 showed BS= 121, A1c= 6.3.Marland KitchenMarland Kitchen Ok on diet Rx. ~  Labs 4/13 showed BS= 122, A1c= 7.1 on diet alone & we reviewed low carb diet  restriction... ~  11/13:  No DM retinopathy per DrStoneburner... ~  5/14: borderline DM on diet alone & labs 5/14 showed BS= 102, A1c=5.9  Hx of ESOPHAGEAL STRICTURE (ICD-530.3) - last EGD 6/04 by DrDBrodie w/ esophagitis, hx gastritis... 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 treated w/ PRILOSEC 20mg Bid... ~  [facility] Sep09 w/ atyp CP felt to be esoph spasm... GI f/u DrDBrodie w/ EGD Sep09= candida esoph, nonobstructing esoph stricture dilated. ~  2/11: presents w/ incr dysphagia & f/u GI w/ Ba Esophagram-mild stricture;  EGD 3/11 showed candida esophagitis (Rx'd);  Sonar- no gallstones, WNL.Marland KitchenMarland Kitchen  IRRITABLE BOWEL SYNDROME, HX OF (ICD-V12.79) - colonoscopy was 6/04 by DrDBrodie= neg without polyps etc... f/u 9/09 was negative- no recurrent polyps... f/u planned 9/14... ~  she reports trouble w/ constip and hx recurrent rectocele- initial surg  1610, now sees DrGottsegen for GYN & may need additional surg... ~  She saw DrDBrodie 9/12> sm vol intermit painless rectal bleeding from anal fissures; rectocele, constip, difficulty evacuating; on stool softeners & Nupercainal oint.  UROLOGY> followed by DrKimbrough> Hx microhematuria, renal cyst, duplication & dilatation on the left, left sided pain off & on...  DEGENERATIVE JOINT DISEASE (ICD-715.90) >> on TRAMADOL 50mg  prn  BACK PAIN, LUMBAR (ICD-724.2) >> she also takes MVI, Vit D...  FIBROMYALGIA (ICD-729.1)  PERIPHERAL NEUROPATHY (ICD-356.9) - sl worse per pt and DrLove follows... she takes NEURONTIN 300mg Bid and refuses other meds... 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  ?TIA - hosp 11/07 by Autumn Patty w/ ?TIA vs sleep paralysis episode... MRIBrain w/ sm vessel dis and atherosclerotic changes... she still complains of "weak spells, my whole body gets weak in the afternoons"... these episodes last ~74min, resolve  spont w/ rest or ?better after eating... she is convinced that the sleep paralysis episodes were caused by the Requip med...  ANXIETY DEPRESSION (ICD-300.4) - she states that "my nerves are shot" w/ stress from son's alcoholism & divorce, plus her husb's illness etc... started ZOLOFT 50mg /d 7/10 & improved...  ANEMIA >> due to Anemia of chronic dis & blood loss from epistaxis 2/13 & 3/13... ~  Labs from 2/13 showed baseline Hg= 10-11 range & bleed down to Hg=7.7 on Tx to [facility]... ~  Labs here 4/13 showed Hg= 9.1, Fe= 18 (4%sat); started on FeSO4 325mg  Bid (w/ VitC 500mg ) but she only took once daily... ~  Labs 5/13 showed Hg= 11.1, Fe= 132 (38%)... On Fe one daily... ~  Labs 5/14 showed Hg= 12.8  Health Maintenance: ~  GYN= DrGottsegen w/ Mammograms at [facility] (last 4/10 w/ ultrasound f/u)... Gyn does BMD's and she reports it was normal several yrs ago, not on calcium, MVI, etc... ~  Immunizations:  she reports 2010 Flu vaccine & Pneumonia shot in 2010... cna't recall last Tetanus.   Past Medical History  Diagnosis Date  . OSA (obstructive sleep apnea)   . HTN (hypertension)   . Atrial fibrillation     paroxysmal; on coumadin  . CAD (coronary artery disease)     a. cath 7/11: LM 40%, mild plaque disease in CFX, LAD, and RCA;  b. 2011 CABG with S-LAD and S-OM done at time of Aortic dissection repair  . Chronic diastolic CHF (congestive heart failure)     a. 08/2011 Echo: EF 55-60%, PASP  . Dissection of aorta, thoracic     a. Type A; s/p repair 7/11 with aortic root repair and CABG x 2   . ASD (atrial septal defect)     a. s/p repair 1982 at [facility]  . Right heart failure     a. 2/2 TR and RV dysfxn;  b. echo 4/12: EF 60%, mild LVH, mild AI, mild MR, mod LAE, mod RVE with mod dec. RVSF, mod RAE, mod to severe TR, PASP 58;  c. right heart cath 4/12:  RA mean 8, RV 46/1 with mean 6, PA 45/13 with mean 26, PCWP mean 14, CO 3.68, CI 2.1 (no sig pulmon HTN)  . GERD  (gastroesophageal reflux disease)   . IBS (irritable bowel syndrome)   . Esophageal stricture   . Colonic polyp   . Peripheral neuropathy   . Back pain   . Fibromyalgia   . Anxiety   .  HLD (hyperlipidemia)   . Borderline diabetes   . History of TIAs   . DJD (degenerative joint disease)   . Normocytic anemia   . Thrombocytopenia   . Macular degeneration   . Depression   . Esophageal dysmotilities     Past Surgical History  Procedure Laterality Date  . Asd repair  1982    [facility]  . Rotator cuff repair  2007    right  . Cardioversion      x 3  . Tubal ligation    . Appendectomy    . Vaginal hysterectomy  1987    A/P Repair With Cystocele and rectocele repair  . Tonsillectomy    . Oophorectomy  1994    BSO  . Pelvic laparoscopy  1994  . Emergency redo median sternotomy for hemiarch repair of acute type a aortic  dissection  06/05/2010  . Nasal hemorrhage control  12/27/2011    Procedure: EPISTAXIS CONTROL;  Surgeon: Melvenia Beam, MD;  Location: [facility] OR;  Service: ENT;  Laterality: N/A;    Outpatient Encounter Prescriptions as of 10/17/2013  Medication Sig  . aspirin EC 81 MG tablet Take 1 tablet (81 mg total) by mouth daily.  . cholecalciferol (VITAMIN D) 1000 UNITS tablet Take 1,000 Units by mouth daily.   . clorazepate (TRANXENE) 7.5 MG tablet TAKE 1 TABLET THREE TIMES A DAY  . docusate sodium (COLACE) 50 MG capsule Take 50 mg by mouth 2 (two) times daily.  Marland Kitchen gabapentin (NEURONTIN) 300 MG capsule TAKE 2 CAPSULES BY MOUTH AT BEDTIME  . glucosamine-chondroitin 500-400 MG tablet Take 1 tablet by mouth 2 (two) times daily.  Marland Kitchen HYDROcodone-acetaminophen (NORCO/VICODIN) 5-325 MG per tablet TAKE 1 TABLET EVERY 8 HOURS AS NEEDED FOR PAIN  . metoprolol tartrate (LOPRESSOR) 25 MG tablet TAKE 0.5 TABLETS (12.5 MG TOTAL) BY MOUTH 2 (TWO) TIMES DAILY.  Marland Kitchen omeprazole (PRILOSEC) 20 MG capsule TAKE 1 CAPSULE BY MOUTH TWICE A DAY 30 MINUTES BEFORE MEALS  . Polyethylene Glycol 3350 (MIRALAX  PO) Take by mouth at bedtime.  . potassium chloride SA (K-DUR,KLOR-CON) 20 MEQ tablet TAKE 1 TABLET (20 MEQ TOTAL) BY MOUTH DAILY.  Marland Kitchen senna-docusate (SENOKOT-S) 8.6-50 MG per tablet Take 1 tablet by mouth 2 (two) times daily.  . sertraline (ZOLOFT) 50 MG tablet TAKE 1 TABLET BY MOUTH EVERY MORNING  . spironolactone (ALDACTONE) 25 MG tablet TAKE 1 TABLET (25 MG TOTAL) BY MOUTH DAILY.  Marland Kitchen TIKOSYN 250 MCG capsule TAKE ONE CAPSULE BY MOUTH EVERY 12 HOURS  . torsemide (DEMADEX) 20 MG tablet TAKE 3 TABLETS (60 MG TOTAL) BY MOUTH DAILY.  . traMADol (ULTRAM) 50 MG tablet TAKE 1 TABLET (50 MG TOTAL) BY MOUTH 3 (THREE) TIMES DAILY AS NEEDED. FOR PAIN  . traMADol (ULTRAM) 50 MG tablet TAKE 1 TABLET (50 MG TOTAL) BY MOUTH 3 (THREE) TIMES DAILY AS NEEDED. FOR PAIN  . vitamin C (ASCORBIC ACID) 500 MG tablet Take 500 mg by mouth daily.    Allergies  Allergen Reactions  . Amiodarone     Severe side effects per Pt--  . Atorvastatin     REACTION: muscle pain  . Codeine     REACTION: itching  . Crestor [Rosuvastatin] Other (See Comments)    Muscle weakness  . Erythromycin Other (See Comments)    unknown  . Ezetimibe     REACTION: INTOL to Zetia w/ cough  . Meperidine Hcl Other (See Comments)    REACTION: dizziness  . Morphine Other (See Comments)  Pt states it "makes her crazy"  . Neomycin-Bacitracin Zn-Polymyx Other (See Comments)    blisters  . Ropinirole Hydrochloride     REACTION: INTOL to Requip w/ sleep paralysis  . Simvastatin     REACTION: unable to walk--muscle pain    Current Medications, Allergies, Past Medical History, Past Surgical History, Family History, and Social History were reviewed in [employer] record.    Review of Systems         See HPI - all other systems neg except as noted...  The patient complains of decreased hearing, dyspnea on exertion, and peripheral edema.  The patient denies anorexia, fever, weight loss, weight gain, vision loss,  hoarseness, chest pain, syncope, prolonged cough, headaches, hemoptysis, abdominal pain, melena, hematochezia, severe indigestion/heartburn, hematuria, incontinence, muscle weakness, suspicious skin lesions, transient blindness, difficulty walking, depression, unusual weight change, abnormal bleeding, enlarged lymph nodes, and angioedema.    Objective:   Physical Exam    WD, Thin, 78 y/o WF in NAD... GENERAL:  Alert & oriented; pleasant & cooperative... HEENT:  [place]/AT, EOM-wnl, PERRLA, Fundi-benign, EACs-clear, TMs-wnl, NOSE-clear, THROAT-clear & wnl... NECK:  Supple w/ fairROM; no JVD; normal carotid impulses w/o bruits; no thyromegaly or nodules palpated; no lymphadenopathy. CHEST:  Median sterotomy scar, decr BS at bases w/ few bibasilar rales... HEART:  Regular Rhythm;  gr 1/6 sys murmur, without rubs or gallops detected... s/p repair ASD & Ao dissection... ABDOMEN:  Soft & nontender; normal bowel sounds; no organomegaly or masses detected. EXT: without deformities, mild arthritic changes; no varicose veins/ +venous insuffic/ 3+ edema. NEURO:  CN's intact;  peripheral neuropathy without focal neuro deficits on exam... DERM:  No lesions noted; no rash etc...  RADIOLOGY DATA:  Reviewed in the EPIC EMR & discussed w/ the patient...  LABORATORY DATA:  Reviewed in the EPIC EMR & discussed w/ the patient...   Assessment & Plan:    Epistaxsis>  She has Saline/ Mucinex and rec ret to ENT if not resolved...  Abn CXR> s/p AscAo dissection repair & bilat apical schwannomas (see CXR & CTA report); O2sat= 100% on RA now & advised incr exercise at home...     HBP> on 3 meds as above + K20; better controlled & clinically stable...     CARDIAC> followed by DrCooper & DrOwens w/ long complic cardiac hx> ASD secundum repair at [facility], CAD, PAF, Ac on Chr Diastolic CHF, & 7/11 Asc ThorAo Dissection w/ severe AI & Lmain lesion==> s/p repair of dissecting aneurysm & CABGx2... She knows to take Amox  before dental work;  known severe right heart failure w/ severe TR & no surg option> being managed by DrCooper for Cards...     AFib> much improved on Tikosyn, converted to NSR, & off blood thinners- feels much better, exercising etc...     CHOL> off Crestor on diet alone & needs f/u FLP on diet alone...     DM> Borderline DM on diet alone & labs look good- keep up the good work...     Hx esophagitis & stricture> followed by DrDBrodie on Prilosec20Bid; last EGD 3/11ndida (Rx'd); denies heartburn, pain, dysphagia, etc...  IBS w/ constip & Rectocele> also followed by DrGottsegen; on stool softeners but she has trouble w/ evacuation & they are aware...     Left flank discomfort> she also sees Urology (prev DrKimbrough) & has f/u appt pending...     DJD/ LBP/ FM> she is actually c/o "corns" on her feet that make walking difficult she  says; she will try OTC pads & if not improved she will call Podiatry...     Neuropathy> followed by Autumn Patty on Neurontin300Bid & Tramadol Prn...     Anxiety/ Depression> on Zoloft50, Tranxene7.5 Prn (ave~1/d she says)...  ANEMIA>  Hg and Fe are remarkably better...   Patient's Medications  New Prescriptions   No medications on file  Previous Medications   ASPIRIN EC 81 MG TABLET    Take 1 tablet (81 mg total) by mouth daily.   CHOLECALCIFEROL (VITAMIN D) 1000 UNITS TABLET    Take 1,000 Units by mouth daily.    DOCUSATE SODIUM (COLACE) 50 MG CAPSULE    Take 50 mg by mouth 2 (two) times daily.   GLUCOSAMINE-CHONDROITIN 500-400 MG TABLET    Take 1 tablet by mouth 2 (two) times daily.   HYDROCODONE-ACETAMINOPHEN (NORCO/VICODIN) 5-325 MG PER TABLET    TAKE 1 TABLET EVERY 8 HOURS AS NEEDED FOR PAIN   POLYETHYLENE GLYCOL 3350 (MIRALAX PO)    Take by mouth at bedtime.   VITAMIN C (ASCORBIC ACID) 500 MG TABLET    Take 500 mg by mouth daily.  Modified Medications   Modified Medication Previous Medication   CLORAZEPATE (TRANXENE) 7.5 MG TABLET clorazepate (TRANXENE)  7.5 MG tablet      TAKE 1 TABLET THREE TIMES A DAY    TAKE 1 TABLET THREE TIMES A DAY   DOFETILIDE (TIKOSYN) 250 MCG CAPSULE TIKOSYN 250 MCG capsule      TAKE ONE CAPSULE BY MOUTH EVERY 12 HOURS    TAKE ONE CAPSULE BY MOUTH EVERY 12 HOURS   GABAPENTIN (NEURONTIN) 300 MG CAPSULE gabapentin (NEURONTIN) 300 MG capsule      TAKE 2 CAPSULES BY MOUTH AT BEDTIME    TAKE 2 CAPSULES BY MOUTH AT BEDTIME   METOPROLOL TARTRATE (LOPRESSOR) 25 MG TABLET metoprolol tartrate (LOPRESSOR) 25 MG tablet      TAKE 0.5 TABLETS (12.5 MG TOTAL) BY MOUTH 2 (TWO) TIMES DAILY.    TAKE 0.5 TABLETS (12.5 MG TOTAL) BY MOUTH 2 (TWO) TIMES DAILY.   OMEPRAZOLE (PRILOSEC) 20 MG CAPSULE omeprazole (PRILOSEC) 20 MG capsule      TAKE 1 CAPSULE BY MOUTH TWICE A DAY 30 MINUTES BEFORE MEALS    TAKE 1 CAPSULE BY MOUTH TWICE A DAY 30 MINUTES BEFORE MEALS   POTASSIUM CHLORIDE SA (K-DUR,KLOR-CON) 20 MEQ TABLET potassium chloride SA (K-DUR,KLOR-CON) 20 MEQ tablet      TAKE 1 TABLET (20 MEQ TOTAL) BY MOUTH DAILY.    TAKE 1 TABLET (20 MEQ TOTAL) BY MOUTH DAILY.   SERTRALINE (ZOLOFT) 50 MG TABLET sertraline (ZOLOFT) 50 MG tablet      TAKE 1 TABLET BY MOUTH EVERY MORNING    TAKE 1 TABLET BY MOUTH EVERY MORNING   SPIRONOLACTONE (ALDACTONE) 25 MG TABLET spironolactone (ALDACTONE) 25 MG tablet      TAKE 1 TABLET (25 MG TOTAL) BY MOUTH DAILY.    TAKE 1 TABLET (25 MG TOTAL) BY MOUTH DAILY.   TORSEMIDE (DEMADEX) 20 MG TABLET torsemide (DEMADEX) 20 MG tablet      TAKE 3 TABLETS BY MOUTH DAILY AS DIRECTED    TAKE 3 TABLETS BY MOUTH DAILY AS DIRECTED   TRAMADOL (ULTRAM) 50 MG TABLET traMADol (ULTRAM) 50 MG tablet      TAKE 1 TABLET (50 MG TOTAL) BY MOUTH 3 (THREE) TIMES DAILY AS NEEDED. FOR PAIN    TAKE 1 TABLET (50 MG TOTAL) BY MOUTH 3 (THREE) TIMES DAILY AS NEEDED. FOR PAIN  Discontinued Medications   SENNA-DOCUSATE (SENOKOT-S) 8.6-50 MG PER TABLET    Take 1 tablet by mouth 2 (two) times daily.   TRAMADOL (ULTRAM) 50 MG TABLET    TAKE 1 TABLET (50  MG TOTAL) BY MOUTH 3 (THREE) TIMES DAILY AS NEEDED. FOR PAIN

## 2013-10-21 ENCOUNTER — Encounter: Payer: Self-pay | Admitting: Pulmonary Disease

## 2013-10-24 ENCOUNTER — Other Ambulatory Visit (INDEPENDENT_AMBULATORY_CARE_PROVIDER_SITE_OTHER): Payer: Medicare Other

## 2013-10-24 DIAGNOSIS — R7309 Other abnormal glucose: Secondary | ICD-10-CM

## 2013-10-24 DIAGNOSIS — I10 Essential (primary) hypertension: Secondary | ICD-10-CM

## 2013-10-24 DIAGNOSIS — E7800 Pure hypercholesterolemia, unspecified: Secondary | ICD-10-CM

## 2013-10-24 DIAGNOSIS — R0609 Other forms of dyspnea: Secondary | ICD-10-CM

## 2013-10-24 DIAGNOSIS — F419 Anxiety disorder, unspecified: Secondary | ICD-10-CM

## 2013-10-24 DIAGNOSIS — R0600 Dyspnea, unspecified: Secondary | ICD-10-CM

## 2013-10-24 DIAGNOSIS — F411 Generalized anxiety disorder: Secondary | ICD-10-CM

## 2013-10-24 LAB — CBC WITH DIFFERENTIAL/PLATELET
Basophils Absolute: 0 10*3/uL (ref 0.0–0.1)
Basophils Relative: 0.5 % (ref 0.0–3.0)
Eosinophils Absolute: 0.1 10*3/uL (ref 0.0–0.7)
Eosinophils Relative: 1.7 % (ref 0.0–5.0)
HCT: 38 % (ref 36.0–46.0)
Hemoglobin: 12.5 g/dL (ref 12.0–15.0)
Lymphocytes Relative: 20.9 % (ref 12.0–46.0)
Lymphs Abs: 1.1 10*3/uL (ref 0.7–4.0)
MCHC: 32.8 g/dL (ref 30.0–36.0)
MCV: 96 fl (ref 78.0–100.0)
Monocytes Absolute: 0.4 10*3/uL (ref 0.1–1.0)
Monocytes Relative: 7.8 % (ref 3.0–12.0)
Neutro Abs: 3.7 10*3/uL (ref 1.4–7.7)
Neutrophils Relative %: 69.1 % (ref 43.0–77.0)
Platelets: 192 10*3/uL (ref 150.0–400.0)
RBC: 3.96 Mil/uL (ref 3.87–5.11)
RDW: 13.9 % (ref 11.5–14.6)
WBC: 5.4 10*3/uL (ref 4.5–10.5)

## 2013-10-24 LAB — HEPATIC FUNCTION PANEL
ALT: 11 U/L (ref 0–35)
AST: 19 U/L (ref 0–37)
Albumin: 4.1 g/dL (ref 3.5–5.2)
Alkaline Phosphatase: 64 U/L (ref 39–117)
Bilirubin, Direct: 0.2 mg/dL (ref 0.0–0.3)
Total Bilirubin: 0.9 mg/dL (ref 0.3–1.2)
Total Protein: 7.9 g/dL (ref 6.0–8.3)

## 2013-10-24 LAB — BASIC METABOLIC PANEL
CO2: 32 mEq/L (ref 19–32)
Calcium: 9.6 mg/dL (ref 8.4–10.5)
Chloride: 102 mEq/L (ref 96–112)
GFR: 62.29 mL/min (ref >60.00)
Sodium: 143 mEq/L (ref 135–145)

## 2013-10-24 LAB — LIPID PANEL
HDL: 47.6 mg/dL (ref >39.00)
Total CHOL/HDL Ratio: 4
Triglycerides: 84 mg/dL (ref 0.0–149.0)

## 2013-10-24 LAB — HEMOGLOBIN A1C: Hgb A1c MFr Bld: 5.9 % (ref 4.6–6.5)

## 2013-10-24 LAB — BRAIN NATRIURETIC PEPTIDE: Pro B Natriuretic peptide (BNP): 287 pg/mL — ABNORMAL HIGH (ref 0.0–100.0)

## 2013-10-24 LAB — LDL CHOLESTEROL, DIRECT: Direct LDL: 156.7 mg/dL

## 2013-11-28 ENCOUNTER — Telehealth: Payer: Self-pay | Admitting: Pulmonary Disease

## 2013-11-28 MED ORDER — AMOXICILLIN 500 MG PO CAPS: ORAL_CAPSULE | ORAL | Status: DC

## 2013-11-28 NOTE — Telephone Encounter (Signed)
I called and spoke with pt. She reports she has ot have ABX called in anytime she has any type of dental work. She reports she is having her teeth cleaned next wed and requesting ABX be called in. Please advise SN thanks  Allergies  Allergen Reactions  . Amiodarone     Severe side effects per Pt--  . Atorvastatin     REACTION: muscle pain  . Codeine     REACTION: itching  . Crestor [Rosuvastatin] Other (See Comments)    Muscle weakness  . Erythromycin Other (See Comments)    unknown  . Ezetimibe     REACTION: INTOL to Zetia w/ cough  . Meperidine Hcl Other (See Comments)    REACTION: dizziness  . Morphine Other (See Comments)    Pt states it "makes her crazy"  . Neomycin-Bacitracin Zn-Polymyx Other (See Comments)    blisters  . Ropinirole Hydrochloride     REACTION: INTOL to Requip w/ sleep paralysis  . Simvastatin     REACTION: unable to walk--muscle pain

## 2013-11-28 NOTE — Telephone Encounter (Signed)
Per SN---  amox 500 mg  #4  Take 4 capsules by mouth 1 hour prior to dental procedure.  Called and spoke with pt and she is aware of rx being sent to the pharmacy.  Nothing further is needed.

## 2013-12-03 ENCOUNTER — Other Ambulatory Visit: Payer: Self-pay | Admitting: Pulmonary Disease

## 2013-12-10 ENCOUNTER — Encounter: Payer: Self-pay | Admitting: Adult Health

## 2013-12-10 ENCOUNTER — Ambulatory Visit (INDEPENDENT_AMBULATORY_CARE_PROVIDER_SITE_OTHER): Payer: Medicare Other | Admitting: Adult Health

## 2013-12-10 VITALS — BP 120/66 | HR 70 | Temp 97.6°F

## 2013-12-10 DIAGNOSIS — L03119 Cellulitis of unspecified part of limb: Secondary | ICD-10-CM

## 2013-12-10 DIAGNOSIS — Z23 Encounter for immunization: Secondary | ICD-10-CM

## 2013-12-10 DIAGNOSIS — L03116 Cellulitis of left lower limb: Secondary | ICD-10-CM | POA: Insufficient documentation

## 2013-12-10 DIAGNOSIS — L02419 Cutaneous abscess of limb, unspecified: Secondary | ICD-10-CM

## 2013-12-10 DIAGNOSIS — L03115 Cellulitis of right lower limb: Secondary | ICD-10-CM | POA: Insufficient documentation

## 2013-12-10 MED ORDER — CEPHALEXIN 500 MG PO CAPS: 500.0000 mg | ORAL_CAPSULE | Freq: Three times a day (TID) | ORAL | Status: DC

## 2013-12-10 MED ORDER — DOXYCYCLINE HYCLATE 100 MG PO TABS: 100.0000 mg | ORAL_TABLET | Freq: Two times a day (BID) | ORAL | Status: DC

## 2013-12-10 NOTE — Assessment & Plan Note (Signed)
Right mid LE cellulitis secondary to lac/injury  Hx of staph cellulitis  Wound care and cx in office  Last Td >5 yrs   Plan  Doxycycline 100mg  Twice daily  For 10 days  Wash area gently with soap and water , rinse and pat dry  Cover with telfa wrap .  Keep leg elevated.  follow up Dr. Lenna Gilford  As planned and As needed   Please contact office for sooner follow up if symptoms do not improve or worsen or seek emergency care  TDAP today

## 2013-12-10 NOTE — Addendum Note (Signed)
Addended by: Parke Poisson E on: 12/10/2013 04:42 PM   Modules accepted: Orders

## 2013-12-10 NOTE — Patient Instructions (Addendum)
Doxycycline 100mg  Twice daily  For 10 days  Wash area gently with soap and water , rinse and pat dry  Cover with telfa wrap .  Keep leg elevated.  follow up Dr. Lenna Gilford  As planned and As needed   Please contact office for sooner follow up if symptoms do not improve or worsen or seek emergency care

## 2013-12-10 NOTE — Addendum Note (Signed)
Addended by: Parke Poisson E on: 12/10/2013 03:07 PM   Modules accepted: Orders

## 2013-12-10 NOTE — Progress Notes (Signed)
Subjective:    Patient ID: [name], female    DOB: November 23, 1934, 79 y.o.   MRN: [id number]  HPI 79 y/o WF      Followed by DrCooper for Cards- hx ASD repair 1982 & dilated AoRoot;  HBP & PAF;  then Aortic dissection w/ emergency surg 7/11 by DrOwen w/ CABG x2 and miraculous recovery...   Followed by DrDBrodie for GI- hx dysphagia & esoph strictures dilated, also hx candida esoph... also prob w/ constip part related to rectocele...   Followed by [facility] ENT at [facility] for severe epistaxis requiring mult surg in 2013 (see below)...  12/10/2013 Acute OV  Pt presents for an acute office visit. Hit right lower leg with car door 1 week ago. Has been trating with Peroxide and Vasoline.  redness, swelling, warm to touch. Noticed over last few days area is red, swollen and tender.  No fever, drainage or chest pain.  Appetite is good, no n/v/d.              Problem List:  SEVERE EPISTAXIS >> see above & managed by DrBates et al + ENT at [facility]=> Dr Archie Patten treated w/ max med Rx w/ Ab for 3wks, nasal saline spray Tid, etc; f/u CT sinuses was improved, perforation in nasal septum noted & they offered surg repair but she is holding off...  OBSTRUCTIVE SLEEP APNEA (ICD-327.23) - sleep study 9/06 by Nino Glow showed RDI=25/hr during REM w/ desat to 77%...  ABN CXR w/ left superior mediastinal opacity ?etiology- no change back to 2006 films= benign... ~  CXR 2/11 showed cardiomeg, ectatic Ao, left superior mediast soft tissue prom w/o change (?ectasia of great vessels or benign mass). ~  CXR 9/11 s/p median sternotomy, dissection repair,  & CABG- cardiomeg, bilat effusions & basilar atx, no ch in left superior lesion... ~  CXR 4/12 showed med sternotomy, cardiomeg, hyerinflation/ scarring, some vasc congestion... ~  CTAngio 5/12 showed stable asc ao dissection repair, rounded soft tissues masses both apicies L>R are prob neural tumors (schwannomas)... ~  CXR 4/13 & 5/13 showed chr severe  cardiomeg, prev CABG, chr pleural blunting but no acute effusions, post-op right axillary changes, NAD.Marland Kitchen. ~  CTAngio 5/13 showed stable appearance in the chest w/ mult findings (SEE REPORT); stable appearance in the abd as well (SEE REPORT)...  HYPERTENSION (ICD-401.9) - controlled on METOPROLOL 40mBid, + LASIX/ ZAROXYLN/ KCl...  ~  10/12:  BP=110/70 and she denies HA, visual changes, CP, palipit, syncope... she is fatigued, SOB/ DOE, & +edema... getting PT/ rehab therapy & improving slowly... ~  4/13:  BP= 100/70 & she is very weak s/p epistaxis [facility] 2[facility]13 & [facility]13 [facility].. ~  5/13:  BP= 100/70 & she is very weak... ~  7/13:  BP= 120/74 & she is feeling much better in NSR on Tikosyn... ~  11/13: BP= 102/60 & she denies CP, palpit, ch in SOB, edema, etc... ~  5/14: on Metoprolol25-1/2Bid, Demadex20-3/d, Aldactone25, K20;  BP= 108/80 & labs showed K= 4.1  ATRIAL FIBRILLATION (ICD-427.31) - on COUMADIN followed in the CC and very carefully monitored due to her severe epistaxis; managed by DrCooper for Cards & she is intol to Amiodarone...  CORONARY ARTERY DISEASE (ICD-414.00) - off ASA due to severe epistaxis episodes.. ACUTE ON CHRONIC DIASTOLIC HEART FAILURE (IXBL-390.30 - prev on Lasix, Zaroxylyn, & KCl; Hosp 6/13 for Tikosyn Rx & much improved in NSR on this & Demadex/ Aldactone/ KCl/ Coumadin... Hx of DISSECTING  AORTIC ANEURYSM THORACIC (ICD-441.01) w/ surg 7/11 by DrOwen... Hx of ATRIAL SEPTAL DEFECT (ICD-745.5) - s/p ASD secundum repaired at [facility] in 1982... long-time pt of DrJoeLeBauer and now DrCooper> ~  Cardiolite 5/06 showed no ischemia & EF=65%...  ~  2DEcho 12/07 showed norm LVF, EF=55%, no regional wall motion abn, & RA/RV= mod dil... ~  Cardiac MRI 3/09 showed mod Ao root enlargement 4.3cm, norm AoV/ Arch/ Desc Ao, mod RV & biatrial enlargement from the prev ASD repair (no resid leak), mod PA enlargement, norm LV w/ EF=59%... ~  [facility] 9/09 w/ MI ruled-out, atyp CP  (prob esoph spasm) & Myoview was neg- no scar or ischemia, EF= 77%... ~  2DEcho 9/10 showed sl incr LV wall thikness c/w mild LVH, norm wall motion, EF= 55-60%, gr2 DD, mild AI, mod dil LA/RA, mod-severe TR, mild pulmHTN... ~  Emergency hosp 7/11 w/ Aortic dissection, severe AI, severe TR & cath w/ 40%Lmain lesion- s/p repair & required CABG x2 as well... ~  DrOwen does CTAngiograms every 4mo.. ~  [facility] from severe [facility] & right heart disease> SEE 2DEcho & Right heart cath data> reviewed... ~  She saw DrCooper 10/12> felt to be reasonably stable, holding NSR, on Coumadin via CC, & f/u 2DEcho ordered & pending... ~  Labs 10/12 showed K=3.2 on K20Tid+4MTh; TCO2=37, BUN=28, Creat=0.8; pt instructed to [facility]20 [facility]... ~  Serial labs avail in the results section... Labs 5/13 showed BUN=21, Creat=0.8, K=3.9 ~  HOSP 6/13 by Cards & much improved in NSR on TIKOSYN 2560mid, DEMADEX2051m3/d, ALDACTONE25m30m KCl 20/d; and COUMADIN via CC...  VENOUS INSUFFICIENCY (ICD-459.81) & EDEMA (ICD-782.3) - she has chr VI changes, superficial VV, and incr edema since surg 7/11... ~  3/09 ABI's done and were WNL...  HYPERCHOLESTEROLEMIA (ICD-272.0) - on CRESTOR 5mgQ63m& she wants to STOP for awhile; she has blamed Zetia for cough in the past... ~  FLP 7/08 showing TChol 143, TG 173, HDL 32, LDL 76. ~  FLP 4/09 showed TChol 172, Tg 161, HDL 38, LDL 102 ~  FLP 7/09 showed TChol 167. Tg 148, HDL 40, LDL 98... rec- continue same. ~  FLP 1/10 showed TChol 177, TG 85, HDL 43, LDL 117 ~  FLP 2/11 showed TChol 160, TG 83, HDL 50, LDL 94 ~  FLP 10/12 on Cres5 showed TChol 150, TG 81, HDL 52, LDL 82 ~  4/13:  She wants to STOP the Cres5Qod for awhile since she is so weak... ~  5/14:  She needs to ret FASTING for FLP on diet alone...  DIABETES MELLITUS, BORDERLINE (ICD-790.29) - on diet Rx alone... ~  Hx borderline BS w/ FBS=119 in 7/08 & HgA1c=6.1 on diet alone. ~  labs 4/09 showed BS=  120, HgA1c= 6.0... coMarland KitchenMarland Kitcheninue diet rx. ~  labs 7/09 showed BS= 132 ~  labs 1/10 showed BS= 120, A1c= 6.1 ~  labs 2/11 showed BS= 106 ~  Labs 10/12 showed BS= 121, A1c= 6.3... OkMarland KitchenMarland Kitchenn diet Rx. ~  Labs 4/13 showed BS= 122, A1c= 7.1 on diet alone & we reviewed low carb diet restriction... ~  11/13:  No DM retinopathy per DrStoneburner... ~  5/14: borderline DM on diet alone & labs 5/14 showed BS= 102, A1c=5.9  Hx of ESOPHAGEAL STRICTURE (ICD-530.3) - last EGD 6/04 by DrDBrodie w/ esophagitis, hx gastritis... 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 treated w/ PRILOSEC 20mgB65m. ~Marland KitchenMarland Kitchen  Hosp Sep09 w/ atyp CP felt to be esoph spasm... GI f/u DrDBrodie w/ EGD Sep09= candida esoph, nonobstructing esoph stricture dilated. ~  2/11: presents w/ incr dysphagia & f/u GI w/ Ba Esophagram-mild stricture;  EGD 3/11 showed candida esophagitis (Rx'd);  Sonar- no gallstones, WNL.Marland KitchenMarland Kitchen  IRRITABLE BOWEL SYNDROME, HX OF (ICD-V12.79) - colonoscopy was 6/04 by DrDBrodie= neg without polyps etc... f/u 9/09 was negative- no recurrent polyps... f/u planned 9/14... ~  she reports trouble w/ constip and hx recurrent rectocele- initial surg 1987, now sees DrGottsegen for GYN & may need additional surg... ~  She saw DrDBrodie 9/12> sm vol intermit painless rectal bleeding from anal fissures; rectocele, constip, difficulty evacuating; on stool softeners & Nupercainal oint.  UROLOGY> followed by DrKimbrough> Hx microhematuria, renal cyst, duplication & dilatation on the left, left sided pain off & on...  DEGENERATIVE JOINT DISEASE (ICD-715.90) >> on TRAMADOL 68m prn  BACK PAIN, LUMBAR (ICD-724.2) >> she also takes MVI, Vit D...  FIBROMYALGIA (ICD-729.1)  PERIPHERAL NEUROPATHY (ICD-356.9) - sl worse per pt and DrLove follows... she takes NEURONTIN 301mid and refuses other meds... 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 MRIBrain w/ sm vessel dis and atherosclerotic changes... she still complains of "weak spells, my whole body gets weak in the afternoons"... these episodes last ~28m6mresolve spont w/ rest or ?better after eating... she is convinced that the sleep paralysis episodes were caused by the Requip med...  ANXIETY DEPRESSION (ICD-300.4) - she states that "my nerves are shot" w/ stress from son's alcoholism & divorce, plus her husb's illness etc... started ZOLOFT 50mg15m/10 & improved...  ANEMIA >> due to Anemia of chronic dis & blood loss from epistaxis 2/13 & 3/13... ~  Labs from 2/13 showed baseline Hg= 10-11 range & bleed down [facility]7.[facility]  Labs here 4/13 showed Hg= 9.1, Fe= 18 (4%sat); started on FeSO4 325mg 74m(w/ VitC 500mg) 32mshe only took once daily... ~  Labs 5/13 showed Hg= 11.1, Fe= 132 (38%)... On Fe one daily... ~  Labs 5/14 showed Hg= 12.8  Health Maintenance: ~  GYN= DrGottsegen w/ [facility] w/ ultrasound f/u)... Gyn does BMD's and she reports it was normal several yrs ago, not on calcium, MVI, etc... ~  Immunizations:  she reports 2010 Flu vaccine & Pneumonia shot in 2010... cna't recall last Tetanus.   Past Medical History  Diagnosis Date  . OSA (obstructive sleep apnea)   . HTN (hypertension)   . Atrial fibrillation     paroxysmal; on coumadin  . CAD (coronary artery disease)     a. cath 7/11: LM 40%, mild plaque disease in CFX, LAD, and RCA;  b. 2011 CABG with S-LAD and S-OM done at time of Aortic dissection repair  . Chronic diastolic CHF (congestive heart failure)     a. 08/2011 Echo: EF 55-60%, PASP 60mmHg 56missection of aorta, thoracic     a. Type A; s/p repair 7/11 with aortic root repair and CABG x 2   . ASD (atrial septal defect)     a. s/p repair 1982 at [facility]  . [facility] heart failure     a. 2/2 TR and RV dysfxn;  b. echo  4/12: EF 60%, mild LVH, mild AI, mild MR, mod LAE,  mod RVE with mod dec. RVSF, mod RAE, mod to severe TR, PASP 58;  c. right heart cath 4/12:  RA mean 8, RV 46/1 with mean 6, PA 45/13 with mean 26, PCWP mean 14, CO 3.68, CI 2.1 (no sig pulmon HTN)  . GERD (gastroesophageal reflux disease)   . IBS (irritable bowel syndrome)   . Esophageal stricture   . Colonic polyp   . Peripheral neuropathy   . Back pain   . Fibromyalgia   . Anxiety   . HLD (hyperlipidemia)   . Borderline diabetes   . History of TIAs   . DJD (degenerative joint disease)   . Normocytic anemia   . Thrombocytopenia   . Macular degeneration   . Depression   . Esophageal dysmotilities     Past Surgical History  Procedure Laterality Date  . Asd repair  1982    [facility]  . Rotator cuff repair  2007    right  . Cardioversion      x 3  . Tubal ligation    . Appendectomy    . Vaginal hysterectomy  1987    A/P Repair With Cystocele and rectocele repair  . Tonsillectomy    . Oophorectomy  1994    BSO  . Pelvic laparoscopy  1994  . Emergency redo median sternotomy for hemiarch repair of acute type a aortic  dissection  06/05/2010  . Nasal hemorrhage control  12/27/2011    Procedure: EPISTAXIS CONTROL;  Surgeon: Ruby Cola, MD;  Location[facility] OR;  Service: ENT;  Laterality: N/A;    Outpatient Encounter Prescriptions as of 12/10/2013  Medication Sig  . amoxicillin (AMOXIL) 500 MG capsule Take 4 capsules by mouth 1 hour prior to dental procedures.  Marland Kitchen aspirin EC 81 MG tablet Take 1 tablet (81 mg total) by mouth daily.  . cholecalciferol (VITAMIN D) 1000 UNITS tablet Take 1,000 Units by mouth daily.   . clorazepate (TRANXENE) 7.5 MG tablet TAKE 1 TABLET THREE TIMES A DAY  . docusate sodium (COLACE) 50 MG capsule Take 50 mg by mouth 2 (two) times daily.  Marland Kitchen dofetilide (TIKOSYN) 250 MCG capsule TAKE ONE CAPSULE BY MOUTH EVERY 12 HOURS  . gabapentin (NEURONTIN) 300 MG capsule TAKE 2 CAPSULES BY MOUTH AT BEDTIME  .  glucosamine-chondroitin 500-400 MG tablet Take 1 tablet by mouth 2 (two) times daily.  Marland Kitchen HYDROcodone-acetaminophen (NORCO/VICODIN) 5-325 MG per tablet TAKE 1 TABLET EVERY 8 HOURS AS NEEDED FOR PAIN  . metoprolol tartrate (LOPRESSOR) 25 MG tablet TAKE 0.5 TABLETS (12.5 MG TOTAL) BY MOUTH 2 (TWO) TIMES DAILY.  Marland Kitchen omeprazole (PRILOSEC) 20 MG capsule TAKE 1 CAPSULE BY MOUTH TWICE A DAY 30 MINUTES BEFORE MEALS  . Polyethylene Glycol 3350 (MIRALAX PO) Take by mouth at bedtime.  . potassium chloride SA (K-DUR,KLOR-CON) 20 MEQ tablet TAKE 1 TABLET (20 MEQ TOTAL) BY MOUTH DAILY.  Marland Kitchen sertraline (ZOLOFT) 50 MG tablet TAKE 1 TABLET BY MOUTH EVERY MORNING  . spironolactone (ALDACTONE) 25 MG tablet TAKE 1 TABLET (25 MG TOTAL) BY MOUTH DAILY.  Marland Kitchen torsemide (DEMADEX) 20 MG tablet TAKE 3 TABLETS BY MOUTH DAILY AS DIRECTED  . traMADol (ULTRAM) 50 MG tablet TAKE 1 TABLET (50 MG TOTAL) BY MOUTH 3 (THREE) TIMES DAILY AS NEEDED. FOR PAIN  . vitamin C (ASCORBIC ACID) 500 MG tablet Take 500 mg by mouth daily.  . cephALEXin (KEFLEX) 500 MG capsule Take 1 capsule (500 mg total) by mouth 3 (three) times daily.  Marland Kitchen doxycycline (VIBRA-TABS) 100 MG tablet  Take 1 tablet (100 mg total) by mouth 2 (two) times daily.  . [DISCONTINUED] sertraline (ZOLOFT) 50 MG tablet TAKE 1 TABLET BY MOUTH EVERY MORNING    Allergies  Allergen Reactions  . Amiodarone     Severe side effects per Pt--  . Atorvastatin     REACTION: muscle pain  . Codeine     REACTION: itching  . Crestor [Rosuvastatin] Other (See Comments)    Muscle weakness  . Erythromycin Other (See Comments)    unknown  . Ezetimibe     REACTION: INTOL to Zetia w/ cough  . Meperidine Hcl Other (See Comments)    REACTION: dizziness  . Morphine Other (See Comments)    Pt states it "makes her crazy"  . Neomycin-Bacitracin Zn-Polymyx Other (See Comments)    blisters  . Ropinirole Hydrochloride     REACTION: INTOL to Requip w/ sleep paralysis  . Simvastatin     REACTION:  unable to walk--muscle pain    Current Medications, Allergies, Past Medical History, Past Surgical History, Family History, and Social History were reviewed in [employer] record.    Review of Systems         See HPI - all other systems neg except as noted...     The patient denies anorexia, fever, weight loss, weight gain, vision loss, hoarseness, chest pain, syncope, prolonged cough, headaches, hemoptysis, abdominal pain, melena, hematochezia, severe indigestion/heartburn, hematuria, incontinence, muscle weakness, suspicious skin lesions, transient blindness, difficulty walking, depression, unusual weight change, abnormal bleeding, enlarged lymph nodes, and angioedema.    Objective:   Physical Exam    WD, Thin, 79 y/o WF in NAD... GENERAL:  Alert & oriented; pleasant & cooperative... HEENT:  [place]/AT, EOM-wnl,  EACs-clear, TMs-wnl, NOSE-clear, THROAT-clear & wnl... NECK:  Supple w/ fairROM; no JVD; normal carotid impulses w/o bruits; no thyromegaly or nodules palpated; no lymphadenopathy. CHEST:  Median sterotomy scar, decr BS at bases w/ few bibasilar rales... HEART:  Regular Rhythm;  gr 1/6 sys murmur, without rubs or gallops detected... s/p repair ASD & Ao dissection... ABDOMEN:  Soft & nontender; normal bowel sounds; no organomegaly or masses detected. EXT: without deformities, mild arthritic changes; no varicose veins/ +venous insuffic/ tr+ edema. NEURO:  CN's intact;  peripheral neuropathy without focal neuro deficits on exam... DERM:   Along right mid shin w/ jagged laceration , partially healed with redness and tenderness , no dressing or drainage noted.

## 2013-12-11 ENCOUNTER — Other Ambulatory Visit: Payer: Self-pay | Admitting: Adult Health

## 2013-12-12 ENCOUNTER — Telehealth: Payer: Self-pay | Admitting: Adult Health

## 2013-12-12 NOTE — Telephone Encounter (Signed)
Spoke with patient; aware that we have Preliminary results not final and TP out of office today-will forward to TP to address on Thursday.    Pt states that she is having scabbing over the wound but still red and heat to the wound, Leg not as swollen as it was in the office. Pt has been in the house all day today.

## 2013-12-13 LAB — WOUND CULTURE
GRAM STAIN: NONE SEEN
GRAM STAIN: NONE SEEN
GRAM STAIN: NONE SEEN

## 2013-12-13 NOTE — Telephone Encounter (Signed)
Please see wound cx note

## 2013-12-13 NOTE — Telephone Encounter (Signed)
Notes Recorded by Melvenia Needles, NP on 12/13/2013 at 9:01 AM cx shows Staph infection , (NOT MRSA)  Cont w/ doxycyline as rx  Cont w/ wound care  follow up with Dr. Lenna Gilford As planend in few weeks  If not improving will need sooner follow up  Please contact office for sooner follow up if symptoms do not improve or worsen or seek emergency care   Pt advised. Bing, CMA

## 2014-01-01 ENCOUNTER — Encounter (HOSPITAL_COMMUNITY): Payer: Self-pay | Admitting: Emergency Medicine

## 2014-01-01 ENCOUNTER — Emergency Department (HOSPITAL_COMMUNITY)
Admission: EM | Admit: 2014-01-01 | Discharge: 2014-01-01 | Disposition: A | Discharge status: Home / Self Care | Payer: Medicare Other | Attending: Emergency Medicine | Admitting: Emergency Medicine

## 2014-01-01 DIAGNOSIS — K219 Gastro-esophageal reflux disease without esophagitis: Secondary | ICD-10-CM | POA: Insufficient documentation

## 2014-01-01 DIAGNOSIS — Z7982 Long term (current) use of aspirin: Secondary | ICD-10-CM | POA: Insufficient documentation

## 2014-01-01 DIAGNOSIS — Z8673 Personal history of transient ischemic attack (TIA), and cerebral infarction without residual deficits: Secondary | ICD-10-CM | POA: Insufficient documentation

## 2014-01-01 DIAGNOSIS — I4891 Unspecified atrial fibrillation: Secondary | ICD-10-CM | POA: Insufficient documentation

## 2014-01-01 DIAGNOSIS — F329 Major depressive disorder, single episode, unspecified: Secondary | ICD-10-CM | POA: Insufficient documentation

## 2014-01-01 DIAGNOSIS — Z860100 Personal history of colon polyps, unspecified: Secondary | ICD-10-CM | POA: Insufficient documentation

## 2014-01-01 DIAGNOSIS — R51 Headache: Secondary | ICD-10-CM | POA: Insufficient documentation

## 2014-01-01 DIAGNOSIS — G609 Hereditary and idiopathic neuropathy, unspecified: Secondary | ICD-10-CM | POA: Insufficient documentation

## 2014-01-01 DIAGNOSIS — Z8639 Personal history of other endocrine, nutritional and metabolic disease: Secondary | ICD-10-CM | POA: Insufficient documentation

## 2014-01-01 DIAGNOSIS — Z862 Personal history of diseases of the blood and blood-forming organs and certain disorders involving the immune mechanism: Secondary | ICD-10-CM | POA: Insufficient documentation

## 2014-01-01 DIAGNOSIS — F411 Generalized anxiety disorder: Secondary | ICD-10-CM | POA: Insufficient documentation

## 2014-01-01 DIAGNOSIS — F3289 Other specified depressive episodes: Secondary | ICD-10-CM | POA: Insufficient documentation

## 2014-01-01 DIAGNOSIS — I10 Essential (primary) hypertension: Secondary | ICD-10-CM | POA: Insufficient documentation

## 2014-01-01 DIAGNOSIS — I2510 Atherosclerotic heart disease of native coronary artery without angina pectoris: Secondary | ICD-10-CM | POA: Insufficient documentation

## 2014-01-01 DIAGNOSIS — M1990 Unspecified osteoarthritis, unspecified site: Secondary | ICD-10-CM | POA: Insufficient documentation

## 2014-01-01 DIAGNOSIS — Z792 Long term (current) use of antibiotics: Secondary | ICD-10-CM | POA: Insufficient documentation

## 2014-01-01 DIAGNOSIS — I5032 Chronic diastolic (congestive) heart failure: Secondary | ICD-10-CM | POA: Insufficient documentation

## 2014-01-01 DIAGNOSIS — Z951 Presence of aortocoronary bypass graft: Secondary | ICD-10-CM | POA: Insufficient documentation

## 2014-01-01 DIAGNOSIS — R040 Epistaxis: Secondary | ICD-10-CM | POA: Insufficient documentation

## 2014-01-01 DIAGNOSIS — IMO0001 Reserved for inherently not codable concepts without codable children: Secondary | ICD-10-CM | POA: Insufficient documentation

## 2014-01-01 LAB — BASIC METABOLIC PANEL
BUN: 32 mg/dL — ABNORMAL HIGH (ref 6–23)
CHLORIDE: 98 meq/L (ref 96–112)
CO2: 32 mEq/L (ref 19–32)
CREATININE: 0.82 mg/dL (ref 0.50–1.10)
Calcium: 9.9 mg/dL (ref 8.4–10.5)
GFR calc non Af Amer: 68 mL/min — ABNORMAL LOW (ref >90)
GFR, EST AFRICAN AMERICAN: 79 mL/min — AB (ref >90)
Glucose, Bld: 127 mg/dL — ABNORMAL HIGH (ref 70–99)
POTASSIUM: 3.8 meq/L (ref 3.7–5.3)
Sodium: 143 mEq/L (ref 137–147)

## 2014-01-01 LAB — CBC WITH DIFFERENTIAL/PLATELET
BASOS PCT: 0 % (ref 0–1)
Basophils Absolute: 0 10*3/uL (ref 0.0–0.1)
Eosinophils Absolute: 0.1 10*3/uL (ref 0.0–0.7)
Eosinophils Relative: 2 % (ref 0–5)
HEMATOCRIT: 37.4 % (ref 36.0–46.0)
HEMOGLOBIN: 12.2 g/dL (ref 12.0–15.0)
LYMPHS ABS: 1.3 10*3/uL (ref 0.7–4.0)
Lymphocytes Relative: 21 % (ref 12–46)
MCH: 32.4 pg (ref 26.0–34.0)
MCHC: 32.6 g/dL (ref 30.0–36.0)
MCV: 99.5 fL (ref 78.0–100.0)
Monocytes Absolute: 0.4 10*3/uL (ref 0.1–1.0)
Monocytes Relative: 6 % (ref 3–12)
NEUTROS ABS: 4.4 10*3/uL (ref 1.7–7.7)
NEUTROS PCT: 71 % (ref 43–77)
Platelets: 193 10*3/uL (ref 150–400)
RBC: 3.76 MIL/uL — AB (ref 3.87–5.11)
RDW: 13.6 % (ref 11.5–15.5)
WBC: 6.2 10*3/uL (ref 4.0–10.5)

## 2014-01-01 LAB — TYPE AND SCREEN
ABO/RH(D): A POS
Antibody Screen: NEGATIVE

## 2014-01-01 LAB — POCT I-STAT, CHEM 8
BUN: 32 mg/dL — ABNORMAL HIGH (ref 6–23)
CALCIUM ION: 1.15 mmol/L (ref 1.13–1.30)
Chloride: 98 mEq/L (ref 96–112)
Creatinine, Ser: 1 mg/dL (ref 0.50–1.10)
Glucose, Bld: 126 mg/dL — ABNORMAL HIGH (ref 70–99)
HEMATOCRIT: 40 % (ref 36.0–46.0)
Hemoglobin: 13.6 g/dL (ref 12.0–15.0)
Potassium: 3.6 mEq/L — ABNORMAL LOW (ref 3.7–5.3)
SODIUM: 143 meq/L (ref 137–147)
TCO2: 31 mmol/L (ref 0–100)

## 2014-01-01 LAB — PROTIME-INR
INR: 0.98 (ref 0.00–1.49)
PROTHROMBIN TIME: 12.8 s (ref 11.6–15.2)

## 2014-01-01 MED ORDER — AMOXICILLIN 500 MG PO CAPS: 500.0000 mg | ORAL_CAPSULE | Freq: Three times a day (TID) | ORAL | Status: DC

## 2014-01-01 MED ORDER — MORPHINE SULFATE 4 MG/ML IJ SOLN
4.0000 mg | Freq: Once | INTRAMUSCULAR | Status: DC
Start: 2014-01-01 — End: 2014-01-01
  Filled 2014-01-01 (×2): qty 1

## 2014-01-01 MED ORDER — HYDROCODONE-ACETAMINOPHEN 5-325 MG PO TABS
1.0000 | ORAL_TABLET | Freq: Once | ORAL | Status: AC
  Administered 2014-01-01: 1 via ORAL
  Filled 2014-01-01: qty 1

## 2014-01-01 MED ORDER — LORAZEPAM 2 MG/ML IJ SOLN
0.5000 mg | Freq: Once | INTRAMUSCULAR | Status: AC
  Administered 2014-01-01: 0.5 mg via INTRAVENOUS
  Filled 2014-01-01: qty 1

## 2014-01-01 MED ORDER — AMOXICILLIN-POT CLAVULANATE 875-125 MG PO TABS: 1.0000 | ORAL_TABLET | Freq: Two times a day (BID) | ORAL | Status: DC

## 2014-01-01 NOTE — ED Notes (Signed)
Intranasal muscosal atomization device inserted in bilateral nares by Estelle Grumbles and [employer].

## 2014-01-01 NOTE — ED Provider Notes (Addendum)
CSN: [id number]     Arrival date & time 01/01/14  1255 History   First MD Initiated Contact with Patient 01/01/14 1259     Chief Complaint  Patient presents with  . Epistaxis     (Consider location/radiation/quality/duration/timing/severity/associated sxs/prior Treatment) The history is provided by the patient and medical records. No language interpreter was used.   This is a 79 year old female who presents emergency department for epistaxis.  Patient is brought in by EMS.  She has a significant history of previous epistaxis episodes requiring surgical intervention and cautery.  The patient has been seen previously by [facility] ear nose and throat as well as [facility] otolaryngology.  Patient states she awoke this morning at 8 AM with a nosebleed.  She states she has been passing large clots and has been running down her throat.  The patient has been using applied pressure for 20 minutes at a time with intermittent resolution of her epistaxis however it has always restarted.  Patient also used gauze soaked in Afrin and used an entire bottle of aspirin without resolution of her nosebleeds.  Patient does have a history of a fair, atrial septal defect however she states she is not taking any anticoagulants.  She does however take daily aspirin.  She denies any recent URI or trauma to the nasal cavity.  Past Medical History  Diagnosis Date  . OSA (obstructive sleep apnea)   . HTN (hypertension)   . Atrial fibrillation     paroxysmal; on coumadin  . CAD (coronary artery disease)     a. cath 7/11: LM 40%, mild plaque disease in CFX, LAD, and RCA;  b. 2011 CABG with S-LAD and S-OM done at time of Aortic dissection repair  . Chronic diastolic CHF (congestive heart failure)     a. 08/2011 Echo: EF 55-60%, PASP 31mmHg  . Dissection of aorta, thoracic     a. Type A; s/p repair 7/11 with aortic root repair and CABG x 2   . ASD (atrial septal defect)     a. s/p repair 1982 at [facility]  . Right heart  failure     a. 2/2 TR and RV dysfxn;  b. echo 4/12: EF 60%, mild LVH, mild AI, mild MR, mod LAE, mod RVE with mod dec. RVSF, mod RAE, mod to severe TR, PASP 58;  c. right heart cath 4/12:  RA mean 8, RV 46/1 with mean 6, PA 45/13 with mean 26, PCWP mean 14, CO 3.68, CI 2.1 (no sig pulmon HTN)  . GERD (gastroesophageal reflux disease)   . IBS (irritable bowel syndrome)   . Esophageal stricture   . Colonic polyp   . Peripheral neuropathy   . Back pain   . Fibromyalgia   . Anxiety   . HLD (hyperlipidemia)   . Borderline diabetes   . History of TIAs   . DJD (degenerative joint disease)   . Normocytic anemia   . Thrombocytopenia   . Macular degeneration   . Depression   . Esophageal dysmotilities    Past Surgical History  Procedure Laterality Date  . Asd repair  1982    [facility]  . Rotator cuff repair  2007    right  . Cardioversion      x 3  . Tubal ligation    . Appendectomy    . Vaginal hysterectomy  1987    A/P Repair With Cystocele and rectocele repair  . Tonsillectomy    . Oophorectomy  1994    BSO  .  Pelvic laparoscopy  1994  . Emergency redo median sternotomy for hemiarch repair of acute type a aortic  dissection  06/05/2010  . Nasal hemorrhage control  12/27/2011    Procedure: EPISTAXIS CONTROL;  Surgeon: Ruby Cola, MD;  Location: [facility] OR;  Service: ENT;  Laterality: N/A;   Family History  Problem Relation Age of Onset  . Pancreatic cancer Sister   . Osteoporosis Sister   . Lung cancer Brother     x 2  . Heart attack Brother   . Melanoma Sister   . Diabetes Mother   . Hypertension Mother   . Colon cancer Sister   . ALS Mother   . Heart disease Brother     x 2   History  Substance Use Topics  . Smoking status: Never Smoker   . Smokeless tobacco: Never Used  . Alcohol Use: No   OB History   Grav Para Term Preterm Abortions TAB SAB Ect Mult Living   4 3 3  1     3      Review of Systems  Ten systems reviewed and are negative for acute change, except as  noted in the HPI.     Allergies  Amiodarone; Atorvastatin; Codeine; Crestor; Erythromycin; Ezetimibe; Meperidine hcl; Morphine; Neomycin-bacitracin zn-polymyx; Ropinirole hydrochloride; and Simvastatin  Home Medications   Current Outpatient Rx  Name  Route  Sig  Dispense  Refill  . amoxicillin (AMOXIL) 500 MG capsule      Take 4 capsules by mouth 1 hour prior to dental procedures.   4 capsule   0   . aspirin EC 81 MG tablet   Oral   Take 1 tablet (81 mg total) by mouth daily.   1 tablet   0   . cholecalciferol (VITAMIN D) 1000 UNITS tablet   Oral   Take 1,000 Units by mouth daily.          . clorazepate (TRANXENE) 7.5 MG tablet      TAKE 1 TABLET THREE TIMES A DAY   90 tablet   5   . docusate sodium (COLACE) 50 MG capsule   Oral   Take 50 mg by mouth 2 (two) times daily.         Marland Kitchen dofetilide (TIKOSYN) 250 MCG capsule      TAKE ONE CAPSULE BY MOUTH EVERY 12 HOURS   60 capsule   11   . doxycycline (VIBRA-TABS) 100 MG tablet   Oral   Take 1 tablet (100 mg total) by mouth 2 (two) times daily.   20 tablet   0   . gabapentin (NEURONTIN) 300 MG capsule      TAKE 2 CAPSULES BY MOUTH AT BEDTIME   180 capsule   3   . glucosamine-chondroitin 500-400 MG tablet   Oral   Take 1 tablet by mouth 2 (two) times daily.         Marland Kitchen HYDROcodone-acetaminophen (NORCO/VICODIN) 5-325 MG per tablet      TAKE 1 TABLET EVERY 8 HOURS AS NEEDED FOR PAIN   50 tablet   5   . metoprolol tartrate (LOPRESSOR) 25 MG tablet      TAKE 0.5 TABLETS (12.5 MG TOTAL) BY MOUTH 2 (TWO) TIMES DAILY.   90 tablet   3   . omeprazole (PRILOSEC) 20 MG capsule      TAKE 1 CAPSULE BY MOUTH TWICE A DAY 30 MINUTES BEFORE MEALS   90 capsule   3   . Polyethylene Glycol 3350 (MIRALAX  PO)   Oral   Take by mouth at bedtime.         . potassium chloride SA (K-DUR,KLOR-CON) 20 MEQ tablet      TAKE 1 TABLET (20 MEQ TOTAL) BY MOUTH DAILY.   90 tablet   3   . sertraline (ZOLOFT) 50 MG  tablet      TAKE 1 TABLET BY MOUTH EVERY MORNING   30 tablet   6   . spironolactone (ALDACTONE) 25 MG tablet      TAKE 1 TABLET (25 MG TOTAL) BY MOUTH DAILY.   90 tablet   3   . torsemide (DEMADEX) 20 MG tablet      TAKE 3 TABLETS BY MOUTH DAILY AS DIRECTED   90 tablet   5   . traMADol (ULTRAM) 50 MG tablet      TAKE 1 TABLET (50 MG TOTAL) BY MOUTH 3 (THREE) TIMES DAILY AS NEEDED. FOR PAIN   90 tablet   5   . vitamin C (ASCORBIC ACID) 500 MG tablet   Oral   Take 500 mg by mouth daily.          BP 142/80  Pulse 77  Temp(Src) 98.2 F (36.8 C) (Oral)  Resp 14  SpO2 100% Physical Exam  Nursing note and vitals reviewed. Constitutional: She is oriented to person, place, and time. She appears well-developed and well-nourished. No distress.  Patient with active epistaxis. Frequently spitting out large clots form her mouth. She appears very uncomfortable.   HENT:  Head: Normocephalic and atraumatic.  Nose: Epistaxis is observed.  Clots and bleeding on the posterior oropharynx  Eyes: Conjunctivae are normal. No scleral icterus.  Neck: Normal range of motion.  Cardiovascular: Normal rate, regular rhythm and normal heart sounds.  Exam reveals no gallop and no friction rub.   No murmur heard. Pulmonary/Chest: Effort normal and breath sounds normal. No respiratory distress.  Abdominal: Soft. Bowel sounds are normal. She exhibits no distension and no mass. There is no tenderness. There is no guarding.  Neurological: She is alert and oriented to person, place, and time.  Skin: Skin is warm and dry. She is not diaphoretic.    ED Course  EPISTAXIS MANAGEMENT Date/Time: 01/01/2014 2:54 PM Performed by: Margarita Mail Authorized by: Margarita Mail Consent: Verbal consent obtained. Risks and benefits: risks, benefits and alternatives were discussed Local anesthetic: lidocaine spray Anesthetic total: 2 ml Patient sedated: no Treatment site: post Right and Left  side. Repair method: suction and nasal balloon (BL rapid rhino 5.5cm  placed to bevel and inflated with 7cc air both sides) Post-procedure assessment: bleeding decreased Patient tolerance: Patient tolerated the procedure well with no immediate complications.   (including critical care time) Labs Review Labs Reviewed  CBC WITH DIFFERENTIAL - Abnormal; Notable for the following:    RBC 3.76 (*)    All other components within normal limits  BASIC METABOLIC PANEL - Abnormal; Notable for the following:    Glucose, Bld 127 (*)    BUN 32 (*)    GFR calc non Af Amer 68 (*)    GFR calc Af Amer 79 (*)    All other components within normal limits  POCT I-STAT, CHEM 8 - Abnormal; Notable for the following:    Potassium 3.6 (*)    BUN 32 (*)    Glucose, Bld 126 (*)    All other components within normal limits  PROTIME-INR  TYPE AND SCREEN   Imaging Review No results found.  EKG  Interpretation   None       MDM   Final diagnoses:  None   1:30 PM Patient with profuse bleed. Unable to visualize source after suctioning.   2:56 PM Patient complains of left sided headache behind the eye that began after inserting rapid rhino on that side.  She continues to bleed down the back of her throat in this is seen around the right rapid rhino.  She is still spitting clots although this is slowed significantly and intermittent.  The patient's hemoglobin appears stable.  I have placed a call to Dr. Olen Pel who is on call for ear nose and throat to discuss the case further.  He can't give in 0.5 mg Ativan and 4 mg morphine for pain and anxiety appear  3:54 PM Spoke with Dr. Janace Hoard who states that they have had great difficulty controling the patient's nosebleeds and asks that we call [facility]. I have informed the patient.    I spoke with Dr. Megan Salon at [facility] who states that this would be an ED to ED transfer and that if the patient did not have profuse, uncontrolled bleeding she  would likely be discharged.  She also states that in reviewing her records she does not see that there is an immediate reason to have the patient transferred.   The patient continues to have oozing and clots are continuing to develop in the pharynx although infrequently now and bleeding has slowed significantly. I have called Dr. Janace Hoard again who has agreed now that he will see the patient. I have also informed the patient of delay issues.   Filed Vitals:   01/01/14 1637 01/01/14 1700 01/01/14 1800 01/01/14 1809  BP: 102/70 93/67 102/54   Pulse: 85 78 77   Temp:    98.2 F (36.8 C)  TempSrc:    Oral  Resp:    16  SpO2: 96% 92% 96%      Patient seen be Dr. Janace Hoard. She will be discharged home with abx,. She has pain medication at home. Patient will follow up with [facility] otolaryngology tomorrow.. Discussed return precautions. The patient appears reasonably screened and/or stabilized for discharge and I doubt any other medical condition or other [facility] requiring further screening, evaluation, or treatment in the ED at this time prior to discharge.   Margarita Mail, PA-C 01/03/14 2158    Margarita Mail, PA-C 01/17/14 [phone]

## 2014-01-01 NOTE — Consult Note (Signed)
Reason for Consult: Epistaxis  Referring Physician: Emergency room  [name] is an 79 y.o. female.  HPI: 79 year old with a complicated history of epistaxis. She has had previous packings and attempted embolization here it [place]. Those failed and the patient was transferred to [facility] for a surgical clipping of her anterior ethmoid artery and sphenopalatine artery. She has been taken off Coumadin as well. She has had intermittent bleeding through the last year but no significant bleeding until today. She states she believes it's the right side that the bleeding started. Both sides were bleeding by the time she arrived and transferred by EMS. He does take a aspirin a day. He now has bilateral packs and has good control of the epistaxis. Her hemoglobin was 12  Past Medical History  Diagnosis Date  . OSA (obstructive sleep apnea)   . HTN (hypertension)   . Atrial fibrillation     paroxysmal; on coumadin  . CAD (coronary artery disease)     a. cath 7/11: LM 40%, mild plaque disease in CFX, LAD, and RCA;  b. 2011 CABG with S-LAD and S-OM done at time of Aortic dissection repair  . Chronic diastolic CHF (congestive heart failure)     a. 08/2011 Echo: EF 55-60%, PASP 37mHg  . Dissection of aorta, thoracic     a. Type A; s/p repair 7/11 with aortic root repair and CABG x 2   . ASD (atrial septal defect)     [facility] Right [facility] failure     a. 2/2 TR and RV dysfxn;  b. echo 4/12: EF 60%, mild LVH, mild AI, mild MR, mod LAE, mod RVE with mod dec. RVSF, mod RAE, mod to severe TR, PASP 58;  c. right heart cath 4/12:  RA mean 8, RV 46/1 with mean 6, PA 45/13 with mean 26, PCWP mean 14, CO 3.68, CI 2.1 (no sig pulmon HTN)  . GERD (gastroesophageal reflux disease)   . IBS (irritable bowel syndrome)   . Esophageal stricture   . Colonic polyp   . Peripheral neuropathy   . Back pain   . Fibromyalgia   . Anxiety   . HLD (hyperlipidemia)   . Borderline diabetes    . History of TIAs   . DJD (degenerative joint disease)   . Normocytic anemia   . Thrombocytopenia   . Macular degeneration   . Depression   . Esophageal dysmotilities     Past Surgical History  Procedure Laterality Date  . Asd repair  1982    [facility]  . Rotator cuff repair  2007    right  . Cardioversion      x 3  . Tubal ligation    . Appendectomy    . Vaginal hysterectomy  1987    A/P Repair With Cystocele and rectocele repair  . Tonsillectomy    . Oophorectomy  1994    BSO  . Pelvic laparoscopy  1994  . Emergency redo median sternotomy for hemiarch repair of acute type a aortic  dissection  06/05/2010  . Nasal hemorrhage control  12/27/2011    Procedure: EPISTAXIS CONTROL;  Surgeon: MRuby Cola MD;  [facility];  Service: ENT;  Laterality: N/A;    Family History  Problem Relation Age of Onset  . Pancreatic cancer Sister   . Osteoporosis Sister   . Lung cancer Brother     x 2  . Heart attack Brother   . Melanoma Sister   .  Diabetes Mother   . Hypertension Mother   . Colon cancer Sister   . ALS Mother   . Heart disease Brother     x 2    Social History:  reports that she has never smoked. She has never used smokeless tobacco. She reports that she does not drink alcohol or use illicit drugs.  Allergies:  Allergies  Allergen Reactions  . Amiodarone     Severe side effects per Pt--  . Atorvastatin     REACTION: muscle pain  . Codeine     REACTION: itching  . Crestor [Rosuvastatin] Other (See Comments)    Muscle weakness  . Erythromycin Other (See Comments)    unknown  . Ezetimibe     REACTION: INTOL to Zetia w/ cough  . Meperidine Hcl Other (See Comments)    REACTION: dizziness  . Morphine Other (See Comments)    Pt states it "makes her crazy"  . Neomycin-Bacitracin Zn-Polymyx Other (See Comments)    blisters  . Ropinirole Hydrochloride     REACTION: INTOL to Requip w/ sleep paralysis  . Simvastatin     REACTION: unable to walk--muscle pain     Medications: I have reviewed the patient's current medications.  Results for orders placed during the hospital encounter of 01/01/14 (from the past 48 hour(s))  TYPE AND SCREEN     Status: None   Collection Time    01/01/14  1:14 PM      Result Value Ref Range   ABO/RH(D) A POS     Antibody Screen NEG     Sample Expiration 01/04/2014    POCT I-STAT, CHEM 8     Status: Abnormal   Collection Time    01/01/14  1:22 PM      Result Value Ref Range   Sodium 143  137 - 147 mEq/L   Potassium 3.6 (*) 3.7 - 5.3 mEq/L   Chloride 98  96 - 112 mEq/L   BUN 32 (*) 6 - 23 mg/dL   Creatinine, Ser 1.00  0.50 - 1.10 mg/dL   Glucose, Bld 126 (*) 70 - 99 mg/dL   Calcium, Ion 1.15  1.13 - 1.30 mmol/L   TCO2 31  0 - 100 mmol/L   Hemoglobin 13.6  12.0 - 15.0 g/dL   HCT 40.0  36.0 - 46.0 %  CBC WITH DIFFERENTIAL     Status: Abnormal   Collection Time    01/01/14  1:25 PM      Result Value Ref Range   WBC 6.2  4.0 - 10.5 K/uL   RBC 3.76 (*) 3.87 - 5.11 MIL/uL   Hemoglobin 12.2  12.0 - 15.0 g/dL   HCT 37.4  36.0 - 46.0 %   MCV 99.5  78.0 - 100.0 fL   MCH 32.4  26.0 - 34.0 pg   MCHC 32.6  30.0 - 36.0 g/dL   RDW 13.6  11.5 - 15.5 %   Platelets 193  150 - 400 K/uL   Neutrophils Relative % 71  43 - 77 %   Neutro Abs 4.4  1.7 - 7.7 K/uL   Lymphocytes Relative 21  12 - 46 %   Lymphs Abs 1.3  0.7 - 4.0 K/uL   Monocytes Relative 6  3 - 12 %   Monocytes Absolute 0.4  0.1 - 1.0 K/uL   Eosinophils Relative 2  0 - 5 %   Eosinophils Absolute 0.1  0.0 - 0.7 K/uL   Basophils Relative 0  0 -  1 %   Basophils Absolute 0.0  0.0 - 0.1 K/uL  BASIC METABOLIC PANEL     Status: Abnormal   Collection Time    01/01/14  1:25 PM      Result Value Ref Range   Sodium 143  137 - 147 mEq/L   Potassium 3.8  3.7 - 5.3 mEq/L   Chloride 98  96 - 112 mEq/L   CO2 32  19 - 32 mEq/L   Glucose, Bld 127 (*) 70 - 99 mg/dL   BUN 32 (*) 6 - 23 mg/dL   Creatinine, Ser 0.82  0.50 - 1.10 mg/dL   Calcium 9.9  8.4 - 10.5 mg/dL    GFR calc non Af Amer 68 (*) >90 mL/min   GFR calc Af Amer 79 (*) >90 mL/min   Comment: (NOTE)     The eGFR has been calculated using the CKD EPI equation.     This calculation has not been validated in all clinical situations.     eGFR's persistently <90 mL/min signify possible Chronic Kidney     Disease.  PROTIME-INR     Status: None   Collection Time    01/01/14  1:25 PM      Result Value Ref Range   Prothrombin Time 12.8  11.6 - 15.2 seconds   INR 0.98  0.00 - 1.49    No results found.  ROS Blood pressure 93/67, pulse 78, temperature 98.2 F (36.8 C), temperature source Oral, resp. rate 22, SpO2 92.00%. Physical Exam  Constitutional: She appears well-developed and well-nourished.  HENT:  Both nares are packed with anterior balloon packs. There is no bleeding. Oral cavity/oropharynx-there is no active bleeding on the nasopharynx and no lesions. Neck is without swelling. There is no swelling of her face or eyes.    Assessment/Plan: Epistaxis-we had a long discussion regarding options at this point. She has bilateral packs and not having any current active bleeding. If she has further bleeding given her complicated history she will need to go back to [facility] for management. She will be discharged and they will call [facility] tomorrow to see if they will see them as an outpatient. She has any further bleeding then she will come back to either emergency room.  Melissa Montane 01/01/2014, 5:36 PM

## 2014-01-01 NOTE — ED Notes (Signed)
Dr. Janace Hoard at bedside.

## 2014-01-01 NOTE — ED Notes (Addendum)
RN went over discharge instructions with patient and reinforced importance to follow up with ENT. MD sts abx- prophylaxis for prodding in nasal cavity- pt updated abx changed per pt request.   Pt requesting to get nasal packing in left side reduced. RN sts will ask PA due to bleeding being stopped at current pressure. PA made aware.   Pt given extra tissue boxes and emesis basins to go home with. Pt in NAD.

## 2014-01-01 NOTE — ED Notes (Signed)
Pt acknowledges discharge instructions. Wheeled patient to car and assisted into vehicle. Pt in NAD. Pt and family appreciative.

## 2014-01-01 NOTE — Discharge Instructions (Signed)
Nosebleeds commonly recur.  If your nose starts bleeding again and doesn't stop after 10-15 minutes of constant pressure squeezing the soft part of your nose, then re-apply the direct pressure and return to the ED. Return also immediately to the nearest ED if you develop chest pain, shortness of breath, dizziness or fainting, severe bleeding, or other concerns.  Nosebleed Nosebleeds can be caused by many conditions including trauma, infections, polyps, foreign bodies, dry mucous membranes or climate, medications and air conditioning. Most nosebleeds occur in the front of the nose. It is because of this location that most nosebleeds can be controlled by pinching the nostrils gently and continuously. Do this for at least 10 to 20 minutes. The reason for this long continuous pressure is that you must hold it long enough for the blood to clot. If during that 10 to 20 minute time period, pressure is released, the process may have to be started again. The nosebleed may stop by itself, quit with pressure, need concentrated heating (cautery) or stop with pressure from packing. HOME CARE INSTRUCTIONS   If your nose was packed, try to maintain the pack inside until your caregiver removes it. If a gauze pack was used and it starts to fall out, gently replace or cut the end off. Do not cut if a balloon catheter was used to pack the nose. Otherwise, do not remove unless instructed.  Avoid blowing your nose for 12 hours after treatment. This could dislodge the pack or clot and start bleeding again.  If the bleeding starts again, sit up and bending forward, gently pinch the front half of your nose continuously for 20 minutes.  If bleeding was caused by dry mucous membranes, cover the inside of your nose every morning with a petroleum or antibiotic ointment. Use your little fingertip as an applicator. Do this as needed during dry weather. This will keep the mucous membranes moist and allow them to heal.  Maintain  humidity in your home by using less air conditioning or using a humidifier.  Do not use aspirin or medications which make bleeding more likely. Your caregiver can give you recommendations on this.  Resume normal activities as able but try to avoid straining, lifting or bending at the waist for several days.  If the nosebleeds become recurrent and the cause is unknown, your caregiver may suggest laboratory tests. SEEK IMMEDIATE MEDICAL CARE IF:   Bleeding recurs and cannot be controlled.  There is unusual bleeding from or bruising on other parts of the body.  You have a fever.  Nosebleeds continue.  There is any worsening of the condition which originally brought you in.  You become lightheaded, feel faint, become sweaty or vomit blood. MAKE SURE YOU:   Understand these instructions.  Will watch your condition.  Will get help right away if you are not doing well or get worse. Document Released: 08/11/2005 Document Revised: 01/24/2012 Document Reviewed: 10/03/2009 [facility] Patient Information 2014 [place], [place].

## 2014-01-01 NOTE — ED Notes (Signed)
Pt sts morphine makes her "act crazy" pt requesting vicodin. RN informed pt she would be unable to get PO meds until bleeding stopped. Abigail PA-C made aware and agreeable sts cannot give Ketoralac for HA due to increased bleeding. sts pt lowest strength option is morphine pt made aware. sts will take low dose- RN went to give patient medication pt SpO2 90-92% and BP 103/systolic Rn informed pt morphine may reduce oxygenation and BP but RN can push low dose very slowly. Pt sts will hold for now and inform RN if needed. Pt sts pain better 5/10. RN gave patient heat pack to apply to head.

## 2014-01-01 NOTE — ED Notes (Signed)
Pt and husband requesting a way to streamline patient due to pt chronic issue and hx of difficulty of getting epistaxis to stop. RN brainstorming with patients ideas- pt and husband state will ask ENT if problem re-occurs if they will be able to be seen there, instead of in the ED to be seen by ENT. Rn provided therapeutic communication with patient and family about frustrations of medical bills.

## 2014-01-01 NOTE — ED Notes (Signed)
Per EMS pt from home with significant, uncontrolled epistaxis. Pt has hx of same, was taken of coumadin and placed on baby aspirin daily due to complication. Pt has had 2 sinus surgeries last 2013. Pt HAS USED 1 bottle of Afrin. No success- pt bleeding large clots from both nares and mouth.

## 2014-01-06 NOTE — ED Provider Notes (Signed)
Medical screening examination/treatment/procedure(s) were conducted as a shared visit with non-physician practitioner(s) and myself.  I personally evaluated the patient during the encounter.  EKG Interpretation   None      Rapid Rhino placed in right and left nasal passages secondary to bilateral epistaxis. Local otolaryngologist and dr at [facility] consulted. Patient is hemodynamically stable  Nat Christen, MD 01/06/14 2054

## 2014-01-10 NOTE — ED Provider Notes (Signed)
Medical screening examination/treatment/procedure(s) were conducted as a shared visit with non-physician practitioner(s) and myself.  I personally evaluated the patient during the encounter.  EKG Interpretation   None      Bilateral epistaxis. Rhino Rocket was placed by a Librarian, academic. Consult to otolaryngologist Dr.Byers  Nat Christen, MD 01/10/14 2033

## 2014-01-16 ENCOUNTER — Telehealth: Payer: Self-pay | Admitting: Cardiovascular Disease

## 2014-01-16 NOTE — Telephone Encounter (Signed)
New Problem:   Pt states she is having pain on and off in her breast. States it is not her chest and she is not having the pain now... Pt states she is not SOB... PT would like a call back from the nurse.

## 2014-01-16 NOTE — Telephone Encounter (Signed)
Pt called because she states for a while she has been feeling pain on left  breast tissue in the mid part of the breast inside behind her nipple, the pain has been on and off for a while. Pt denies SOB, nor A-fib or any other symptoms only the pain in her breast tissue, It is not painful to touch. Pt states " I may be calling the wrong doctor". Pt was made aware that it could be possible and, that she needs to call her GYN to be examined. Pt verbalized understanding.

## 2014-01-17 ENCOUNTER — Other Ambulatory Visit: Payer: Self-pay | Admitting: Dermatology

## 2014-01-17 ENCOUNTER — Ambulatory Visit: Payer: Self-pay | Admitting: Women's Health

## 2014-01-18 NOTE — ED Provider Notes (Signed)
Medical screening examination/treatment/procedure(s) were conducted as a shared visit with non-physician practitioner(s) and myself.  I personally evaluated the patient during the encounter.   EKG Interpretation None     Bilateral epistaxis. Rhino Rocket was inserted bilaterally. Consultation from otolaryngology   Nat Christen, MD 01/18/14 [phone]

## 2014-01-21 ENCOUNTER — Encounter: Payer: Self-pay | Admitting: Pulmonary Disease

## 2014-01-22 ENCOUNTER — Ambulatory Visit (INDEPENDENT_AMBULATORY_CARE_PROVIDER_SITE_OTHER): Payer: Medicare Other | Admitting: Women's Health

## 2014-01-22 ENCOUNTER — Encounter: Payer: Self-pay | Admitting: Women's Health

## 2014-01-22 ENCOUNTER — Telehealth: Payer: Self-pay | Admitting: *Deleted

## 2014-01-22 DIAGNOSIS — N644 Mastodynia: Secondary | ICD-10-CM

## 2014-01-22 NOTE — Telephone Encounter (Signed)
Message copied by Thamas Jaegers on Tue Jan 22, 2014  4:09 PM ------      Message from: [place], [place] J      Created: Tue Jan 22, 2014  3:26 PM       Please schedule diagnostic mammogram  Left outer quad pain off and on months, increase in past few weeks.  Nagging type. No change in exam, no palpable nodules, dimpling retractions or nipple discharge      Afternoons best, not Tuesday, best to schedule in 2 weeks      Week of March 23 ------

## 2014-01-22 NOTE — Telephone Encounter (Signed)
Orders placed at breast center, they will contact patient.

## 2014-01-22 NOTE — Progress Notes (Signed)
Patient ID: [name], female   DOB: 09/27/1935, 79 y.o.   MRN: [id number] Presents with complaint of left outer breast intermittent pain for months but increased in the past few weeks, nagging type discomfort, denies needing to take medication for pain relief. Denies a change in breast exam. No change in routine or exercise. Numerous health problems, heart problems, A. fib, history of a dissecting aortic aneurysm, open heart surgery twice. Epistaxis resulting in anemia.  Exam: Color poor. States had a major  epistaxis 3 weeks ago. Breast exam and sitting and lying position, no visible dimpling, retractions, nipple changes, erythema. No palpable nodules.  Left outer breast discomfort  Plan: Left diagnostic mammogram. Continue [facility]'s report changes.

## 2014-01-26 ENCOUNTER — Other Ambulatory Visit: Payer: Self-pay | Admitting: Pulmonary Disease

## 2014-01-30 NOTE — Telephone Encounter (Signed)
appt 02/08/14 @ 1:45 pm

## 2014-02-08 ENCOUNTER — Ambulatory Visit
Admission: RE | Admit: 2014-02-08 | Discharge: 2014-02-08 | Disposition: A | Discharge status: Home / Self Care | Payer: No Typology Code available for payment source | Source: Ambulatory Visit | Attending: Women's Health | Admitting: Women's Health

## 2014-02-08 DIAGNOSIS — N644 Mastodynia: Secondary | ICD-10-CM

## 2014-02-18 ENCOUNTER — Telehealth: Payer: Self-pay | Admitting: Pulmonary Disease

## 2014-02-18 MED ORDER — TRAMADOL HCL 50 MG PO TABS: 50.0000 mg | ORAL_TABLET | Freq: Three times a day (TID) | ORAL | Status: DC | PRN

## 2014-02-18 NOTE — Telephone Encounter (Signed)
Called and spoke with pts husband and he is aware that this rx has been called to the pharmacy. rx has been called to the pharmacy.  Nothing further is needed.

## 2014-02-25 ENCOUNTER — Telehealth: Payer: Self-pay | Admitting: Pulmonary Disease

## 2014-02-25 NOTE — Telephone Encounter (Signed)
Called and spoke w/ spouse. Made him aware they will need to call downstairs to see if appt can be scheduled. We are no longer making referrals as they can just call the office for an appt. He will do so. Nothing further needed

## 2014-03-11 ENCOUNTER — Telehealth: Payer: Self-pay | Admitting: Cardiovascular Disease

## 2014-03-11 NOTE — Telephone Encounter (Signed)
New message          Pt neck has been hurting for a month now. Pt would like to know if there is something wrong with blood vessels in neck.

## 2014-03-11 NOTE — Telephone Encounter (Signed)
I spoke with the [name] and she complains of pain in the base of her skull when she moves her neck. This has been on going for a month.  I made the [name] aware that this sounds muscle related. In Dr Antionette Char assessment he has not heard carotid bruits. I made the [name] aware that she should contact her PCP for further evaluation of symptoms. [name] agreed with plan.

## 2014-03-11 NOTE — Telephone Encounter (Signed)
No answer at home number, left message on cell phone to call the office.

## 2014-03-13 ENCOUNTER — Encounter: Payer: Self-pay | Admitting: Internal Medicine

## 2014-03-13 ENCOUNTER — Ambulatory Visit (HOSPITAL_COMMUNITY): Payer: Medicare Other | Attending: Internal Medicine | Admitting: Radiology

## 2014-03-13 DIAGNOSIS — I4891 Unspecified atrial fibrillation: Secondary | ICD-10-CM | POA: Insufficient documentation

## 2014-03-13 DIAGNOSIS — I2510 Atherosclerotic heart disease of native coronary artery without angina pectoris: Secondary | ICD-10-CM | POA: Insufficient documentation

## 2014-03-13 DIAGNOSIS — I71019 Dissection of thoracic aorta, unspecified: Secondary | ICD-10-CM | POA: Insufficient documentation

## 2014-03-13 DIAGNOSIS — I7101 Dissection of thoracic aorta: Secondary | ICD-10-CM | POA: Insufficient documentation

## 2014-03-13 NOTE — Progress Notes (Signed)
Echocardiogram performed.  

## 2014-03-21 ENCOUNTER — Ambulatory Visit (INDEPENDENT_AMBULATORY_CARE_PROVIDER_SITE_OTHER): Payer: Medicare Other | Admitting: Cardiovascular Disease

## 2014-03-21 ENCOUNTER — Encounter: Payer: Self-pay | Admitting: Cardiovascular Disease

## 2014-03-21 VITALS — BP 110/64 | HR 73 | Ht 67.0 in | Wt 162.0 lb

## 2014-03-21 DIAGNOSIS — I10 Essential (primary) hypertension: Secondary | ICD-10-CM

## 2014-03-21 DIAGNOSIS — I2510 Atherosclerotic heart disease of native coronary artery without angina pectoris: Secondary | ICD-10-CM

## 2014-03-21 DIAGNOSIS — I4891 Unspecified atrial fibrillation: Secondary | ICD-10-CM

## 2014-03-21 NOTE — Patient Instructions (Signed)
Your physician recommends that you return for lab work in: [place] (BMP 05/02/14)  Your physician recommends that you continue on your current medications as directed. Please refer to the Current Medication list given to you today.  Your physician wants you to follow-up in: 6 MONTHS with Dr Burt Knack.  You will receive a reminder letter in the mail two months in advance. If you don't receive a letter, please call our office to schedule the follow-up appointment.

## 2014-03-21 NOTE — Progress Notes (Signed)
HPI:   [name] returns for followup evaluation. She is a delightful 79 year old woman with a history of remote surgical repair of an atrial septal defect. In 2011 she presented with a type A aortic dissection and underwent emergent surgical repair. She was treated with multivessel CABG at that time as well. The patient has also been followed for atrial fibrillation, severe tricuspid regurgitation, and chronic congestive heart failure. She has been unable to tolerate warfarin because of severe recurrent epistaxis requiring ENT surgeries. She's had another episode of epistaxis off warfarin requiring packing and further treatment at [facility].  The patient continues to do well from a cardiac perspective. She's had no palpitations, chest pain, or shortness of breath. She walks 1 mile daily without symptoms at that level of exertion. She is primarily limited by problems related to osteoarthritis. She has hip, knee, low back, and left shoulder pain. Her left shoulder pain has been severe and she is contemplating surgery.  Outpatient Encounter Prescriptions as of 03/21/2014  Medication Sig  . amoxicillin (AMOXIL) 500 MG capsule Take 1 capsule (500 mg total) by mouth 3 (three) times daily.  Marland Kitchen aspirin EC 81 MG tablet Take 1 tablet (81 mg total) by mouth daily.  . cholecalciferol (VITAMIN D) 1000 UNITS tablet Take 1,000 Units by mouth daily.   . clorazepate (TRANXENE) 7.5 MG tablet Take 7.5 mg by mouth 3 (three) times daily.  Marland Kitchen docusate sodium (COLACE) 50 MG capsule Take 50 mg by mouth 2 (two) times daily.  Marland Kitchen dofetilide (TIKOSYN) 250 MCG capsule Take 250 mcg by mouth 2 (two) times daily.  Marland Kitchen gabapentin (NEURONTIN) 300 MG capsule Take 600 mg by mouth at bedtime.  Marland Kitchen glucosamine-chondroitin 500-400 MG tablet Take 1 tablet by mouth 2 (two) times daily.  Marland Kitchen HYDROcodone-acetaminophen (NORCO/VICODIN) 5-325 MG per tablet Take 1 tablet by mouth every 6 (six) hours as needed for moderate pain.  . metoprolol tartrate  (LOPRESSOR) 25 MG tablet Take 12.5 mg by mouth 2 (two) times daily.  Marland Kitchen omeprazole (PRILOSEC) 20 MG capsule Take 20 mg by mouth 2 (two) times daily before a meal.  . Polyethylene Glycol 3350 (MIRALAX PO) Take 1 packet by mouth at bedtime.   . potassium chloride SA (K-DUR,KLOR-CON) 20 MEQ tablet Take 20 mEq by mouth daily.  . sertraline (ZOLOFT) 50 MG tablet Take 50 mg by mouth daily.  Marland Kitchen spironolactone (ALDACTONE) 25 MG tablet Take 25 mg by mouth daily.  Marland Kitchen torsemide (DEMADEX) 20 MG tablet Take 60 mg by mouth daily.  . traMADol (ULTRAM) 50 MG tablet Take 1 tablet (50 mg total) by mouth 3 (three) times daily as needed for moderate pain.  . vitamin C (ASCORBIC ACID) 500 MG tablet Take 500 mg by mouth daily.  . [DISCONTINUED] amoxicillin-clavulanate (AUGMENTIN) 875-125 MG per tablet   . [DISCONTINUED] cefdinir (OMNICEF) 300 MG capsule   . [DISCONTINUED] omeprazole (PRILOSEC) 20 MG capsule TAKE 1 CAPSULE BY MOUTH TWICE A DAY 30 MINUTES BEFORE MEALS  . [DISCONTINUED] senna-docusate (SENOKOT-S) 8.6-50 MG per tablet Take 1 tablet by mouth 2 (two) times daily.    Allergies  Allergen Reactions  . Amiodarone     Severe side effects per Pt--  . Crestor [Rosuvastatin] Other (See Comments)    Muscle weakness  . Erythromycin Other (See Comments)    unknown  . Meperidine Hcl Other (See Comments)    REACTION: dizziness  . Ropinirole Hydrochloride     REACTION: INTOL to Requip w/ sleep paralysis  . Atorvastatin Rash  REACTION: muscle pain  . Codeine Rash    REACTION: itching  . Erythromycin Base Rash  . Ezetimibe Rash    REACTION: INTOL to Zetia w/ cough  . Meperidine Rash  . Morphine Other (See Comments) and Rash    Pt states it "makes her crazy"  . Neomycin-Bacitracin Zn-Polymyx Other (See Comments) and Rash    blisters  . Ropinirole Rash  . Simvastatin Rash    REACTION: unable to walk--muscle pain    Past Medical History  Diagnosis Date  . OSA (obstructive sleep apnea)   . HTN  (hypertension)   . Atrial fibrillation     paroxysmal; on coumadin  . CAD (coronary artery disease)     a. cath 7/11: LM 40%, mild plaque disease in CFX, LAD, and RCA;  b. 2011 CABG with S-LAD and S-OM done at time of Aortic dissection repair  . Chronic diastolic CHF (congestive heart failure)     a. 08/2011 Echo: EF 55-60%, PASP 40mmHg  . Dissection of aorta, thoracic     a. Type A; s/p repair 7/11 with aortic root repair and CABG x 2   . ASD (atrial septal defect)     a. s/p repair 1982 at [facility]  . Right heart failure     a. 2/2 TR and RV dysfxn;  b. echo 4/12: EF 60%, mild LVH, mild AI, mild MR, mod LAE, mod RVE with mod dec. RVSF, mod RAE, mod to severe TR, PASP 58;  c. right heart cath 4/12:  RA mean 8, RV 46/1 with mean 6, PA 45/13 with mean 26, PCWP mean 14, CO 3.68, CI 2.1 (no sig pulmon HTN)  . GERD (gastroesophageal reflux disease)   . IBS (irritable bowel syndrome)   . Esophageal stricture   . Colonic polyp   . Peripheral neuropathy   . Back pain   . Fibromyalgia   . Anxiety   . HLD (hyperlipidemia)   . Borderline diabetes   . History of TIAs   . DJD (degenerative joint disease)   . Normocytic anemia   . Thrombocytopenia   . Macular degeneration   . Depression   . Esophageal dysmotilities     ROS: Negative except as per HPI  BP 110/64  Pulse 73  Ht 5\' 7"  (1.702 m)  Wt 73.483 kg (162 lb)  BMI 25.37 kg/m2  PHYSICAL EXAM: Pt is alert and oriented, pleasant woman in NAD HEENT: normal Neck: JVP - normal, carotids 2+= with bilateral bruits Lungs: CTA bilaterally CV: RRR with grade 2/6 systolic murmur at the left sternal border and grade 2/6 holosystolic murmur at the apex Abd: soft, NT, Positive BS, no hepatomegaly Ext: no C/C/E, distal pulses intact and equal Skin: warm/dry no rash  EKG:  Sinus rhythm 73 beats per minute, moderate voltage criteria for LVH, nonspecific ST segment abnormality.  2D Echo 03/13/2013: Study Conclusions  - Left ventricle: The  cavity size was normal. Wall thickness was increased in a pattern of moderate LVH. Postoperative septal hypokinesis. Systolic function was normal. The estimated ejection fraction was in the range of 60% to 65%. Doppler parameters are consistent with abnormal left ventricular relaxation (grade2 diastolic dysfunction). The E/A ratio is >1.5. The E/e' ratio is >20, suggesting markedly elevated LV filling pressure. - Aortic valve: Transvalvular velocity was minimally increased. There was no stenosis. Mildregurgitation. - Aorta: Aortic root dimension: 87mm (ED). - Aortic root: The aortic root is mildly dilated. - Mitral valve: Calcified annulus. The posterior leaflet is partially tethered.  Moderate, posteriorly direct regurgitation that hugs the free wall. - Left atrium: Severely dilated (60 ml/m2). - Right ventricle: RV systolic pressure: 27CW Hg (S, est). - Right atrium: The atrium was mildly dilated. - Atrial septum: Thickened consistent with prior ASD repair. - Tricuspid valve: Moderate regurgitation. - Pulmonic valve: Mild regurgitation. - Inferior vena cava: The vessel was normal in size; the respirophasic diameter changes were in the normal range (= 50%); findings are consistent with normal central venous pressure. - Pericardium, extracardiac: There was no pericardial effusion.  ASSESSMENT AND PLAN: 1. Paroxysmal atrial fibrillation. The patient is stable and maintaining sinus rhythm on Tikosyn. She's had some epistaxis even on no anticoagulant drugs. Will continue current management. EKG shows a normal QTc on Tikosyn.   2. Chronic diastolic heart failure/Severe tricuspid regurgitation. Stable on medical therapy with chronic diuretic use. Echo shows only moderate TR and moderate MR. Continue medical therapy.  3. Coronary artery disease. No anginal symptoms. She was treated with CABG at the time of type A aortic dissection repair.  4. Aortic dissection, type A. Stable findings on  CT angiography. Followed by Dr. Roxy Manns on a yearly basis.   Sherren Mocha 03/21/2014 2:42 PM

## 2014-03-27 ENCOUNTER — Other Ambulatory Visit: Payer: Self-pay

## 2014-03-27 DIAGNOSIS — I71019 Dissection of thoracic aorta, unspecified: Secondary | ICD-10-CM

## 2014-03-27 DIAGNOSIS — I7101 Dissection of thoracic aorta: Secondary | ICD-10-CM

## 2014-04-02 ENCOUNTER — Other Ambulatory Visit: Payer: Self-pay

## 2014-04-02 DIAGNOSIS — I71019 Dissection of thoracic aorta, unspecified: Secondary | ICD-10-CM

## 2014-04-02 DIAGNOSIS — I7101 Dissection of thoracic aorta: Secondary | ICD-10-CM

## 2014-04-07 ENCOUNTER — Encounter: Payer: Self-pay | Admitting: Internal Medicine

## 2014-04-19 ENCOUNTER — Emergency Department (HOSPITAL_COMMUNITY)
Admission: EM | Admit: 2014-04-19 | Discharge: 2014-04-19 | Disposition: A | Discharge status: Home / Self Care | Payer: Medicare Other | Attending: Emergency Medicine | Admitting: Emergency Medicine

## 2014-04-19 ENCOUNTER — Encounter (HOSPITAL_COMMUNITY): Payer: Self-pay | Admitting: Emergency Medicine

## 2014-04-19 DIAGNOSIS — F329 Major depressive disorder, single episode, unspecified: Secondary | ICD-10-CM | POA: Insufficient documentation

## 2014-04-19 DIAGNOSIS — Z8639 Personal history of other endocrine, nutritional and metabolic disease: Secondary | ICD-10-CM | POA: Insufficient documentation

## 2014-04-19 DIAGNOSIS — Z8739 Personal history of other diseases of the musculoskeletal system and connective tissue: Secondary | ICD-10-CM | POA: Insufficient documentation

## 2014-04-19 DIAGNOSIS — F3289 Other specified depressive episodes: Secondary | ICD-10-CM | POA: Insufficient documentation

## 2014-04-19 DIAGNOSIS — F411 Generalized anxiety disorder: Secondary | ICD-10-CM | POA: Insufficient documentation

## 2014-04-19 DIAGNOSIS — I2510 Atherosclerotic heart disease of native coronary artery without angina pectoris: Secondary | ICD-10-CM | POA: Insufficient documentation

## 2014-04-19 DIAGNOSIS — K219 Gastro-esophageal reflux disease without esophagitis: Secondary | ICD-10-CM | POA: Insufficient documentation

## 2014-04-19 DIAGNOSIS — I10 Essential (primary) hypertension: Secondary | ICD-10-CM | POA: Insufficient documentation

## 2014-04-19 DIAGNOSIS — Z860100 Personal history of colon polyps, unspecified: Secondary | ICD-10-CM | POA: Insufficient documentation

## 2014-04-19 DIAGNOSIS — Z8673 Personal history of transient ischemic attack (TIA), and cerebral infarction without residual deficits: Secondary | ICD-10-CM | POA: Insufficient documentation

## 2014-04-19 DIAGNOSIS — Z9889 Other specified postprocedural states: Secondary | ICD-10-CM | POA: Insufficient documentation

## 2014-04-19 DIAGNOSIS — Z8669 Personal history of other diseases of the nervous system and sense organs: Secondary | ICD-10-CM | POA: Insufficient documentation

## 2014-04-19 DIAGNOSIS — Z7982 Long term (current) use of aspirin: Secondary | ICD-10-CM | POA: Insufficient documentation

## 2014-04-19 DIAGNOSIS — R040 Epistaxis: Secondary | ICD-10-CM | POA: Insufficient documentation

## 2014-04-19 DIAGNOSIS — Z79899 Other long term (current) drug therapy: Secondary | ICD-10-CM | POA: Insufficient documentation

## 2014-04-19 DIAGNOSIS — Z862 Personal history of diseases of the blood and blood-forming organs and certain disorders involving the immune mechanism: Secondary | ICD-10-CM | POA: Insufficient documentation

## 2014-04-19 DIAGNOSIS — I5032 Chronic diastolic (congestive) heart failure: Secondary | ICD-10-CM | POA: Insufficient documentation

## 2014-04-19 NOTE — ED Notes (Signed)
Pt reports that she started having a nosebleed last night for about 4 hours. Reports that she packed her nose last night and has gotten the bleeding stopped. States that she thinks the bleeding is now stopped and denies any pain. Reports that she has a septal deficit.

## 2014-04-19 NOTE — ED Notes (Signed)
In room talking with pt

## 2014-04-19 NOTE — ED Provider Notes (Signed)
I saw and evaluated the patient, reviewed the resident's note and I agree with the findings and plan. Patient is a 79 year old female with history of recurrent nosebleeds. She presents today with complaints of nosebleed it has been difficult to control. It began last night. She's been using cotton balls soaked with Afrin, however this continues to ooze. She denies any injury or trauma. She denies any fevers or headache.  On exam, vitals are stable, and the patient is afebrile. Head is atraumatic, normocephalic. Neck is supple. There are 2 cotton balls present in the nares and there is no active bleeding.  Cotton balls were removed and the nose was packed with a [employer]. This achieved good hemostasis. She was observed for proximally 2 hours and had no further bleeding. At this point, I feel that she is appropriate for discharge. She is to followup next week with Dr. Janace Hoard for packing removal.   EKG Interpretation None        Veryl Speak, MD 04/19/14 1556

## 2014-04-19 NOTE — Discharge Instructions (Signed)
It was a pleasure taking care of you. - Please continue normal nose bleed precautions including nasal saline spray 4-5x per day, vaseline to nasal septum twice a day, humidifier at bedside, avoid picking or trauma. - It is normal to have spotting on tissue when you wipe your nose, and a small amount of dark blood/clots as your nosebleed resolves. - If you develop frank, bright red bleeding out of your nostrils or throat, please return to the ED. - Please call Dr. Janace Hoard for an office visit early next week, for packing removal.

## 2014-04-19 NOTE — ED Provider Notes (Signed)
CSN: [id number]     Arrival date & time 04/19/14  1038 History   First MD Initiated Contact with Patient 04/19/14 1112     Chief Complaint  Patient presents with  . Epistaxis     (Consider location/radiation/quality/duration/timing/severity/associated sxs/prior Treatment) HPI  [name] is a 79 y.o. female with PMH type A aortic dissection s/p emergent surgical repair (2011), multivessel CABG (2011), paroxysmal atrial fibrillation, severe tricuspid regurgitation, and chronic congestive heart failure, recurrent epistaxis requiring ENT surgeries at [facility] thus unable to tolerate warfarin, who presents with epistaxis.  Patient reports her nose started bleeding last night. She packed it with cotton swabs dipped in Afrin (as instructed by her ENT) and replaced them every 30 minutes x3. The bleeding seemed to stop, but around 4 AM it began again. She packed her nose again. She came here because she "felt blood bubbling up behind the packing" and she was swallowing a few clots, so she assumed the bleeding had no stopped. She estimates about one to 2 cups of blood loss.  She saw her ENT at [facility] on 01/07/14, they recommended continue normal epistaxis precautions including nasal saline spray 4-5x per day, vaseline to septum BID, humidifier at bedside, avoid picking or trauma.   Past Medical History  Diagnosis Date  . OSA (obstructive sleep apnea)   . HTN (hypertension)   . Atrial fibrillation     paroxysmal; on coumadin  . CAD (coronary artery disease)     a. cath 7/11: LM 40%, mild plaque disease in CFX, LAD, and RCA;  b. 2011 CABG with S-LAD and S-OM done at time of Aortic dissection repair  . Chronic diastolic CHF (congestive heart failure)     a. 08/2011 Echo: EF 55-60%, PASP 68mmHg  . Dissection of aorta, thoracic     a. Type A; s/p repair 7/11 with aortic root repair and CABG x 2   . ASD (atrial septal defect)     a. s/p repair 1982 at [facility]  . Right heart failure     a. 2/2 TR and RV  dysfxn;  b. echo 4/12: EF 60%, mild LVH, mild AI, mild MR, mod LAE, mod RVE with mod dec. RVSF, mod RAE, mod to severe TR, PASP 58;  c. right heart cath 4/12:  RA mean 8, RV 46/1 with mean 6, PA 45/13 with mean 26, PCWP mean 14, CO 3.68, CI 2.1 (no sig pulmon HTN)  . GERD (gastroesophageal reflux disease)   . IBS (irritable bowel syndrome)   . Esophageal stricture   . Colonic polyp   . Peripheral neuropathy   . Back pain   . Fibromyalgia   . Anxiety   . HLD (hyperlipidemia)   . Borderline diabetes   . History of TIAs   . DJD (degenerative joint disease)   . Normocytic anemia   . Thrombocytopenia   . Macular degeneration   . Depression   . Esophageal dysmotilities    Past Surgical History  Procedure Laterality Date  . Asd repair  1982    [facility]  . Rotator cuff repair  2007    right  . Cardioversion      x 3  . Tubal ligation    . Appendectomy    . Vaginal hysterectomy  1987    A/P Repair With Cystocele and rectocele repair  . Tonsillectomy    . Oophorectomy  1994    BSO  . Pelvic laparoscopy  1994  . Emergency redo median sternotomy for hemiarch  repair of acute type a aortic  dissection  06/05/2010  . Nasal hemorrhage control  12/27/2011    Procedure: EPISTAXIS CONTROL;  Surgeon: Ruby Cola, MD;  Location: [facility] OR;  Service: ENT;  Laterality: N/A;   Family History  Problem Relation Age of Onset  . Pancreatic cancer Sister   . Osteoporosis Sister   . Lung cancer Brother     x 2  . Heart attack Brother   . Melanoma Sister   . Diabetes Mother   . Hypertension Mother   . Colon cancer Sister   . ALS Mother   . Heart disease Brother     x 2   History  Substance Use Topics  . Smoking status: Never Smoker   . Smokeless tobacco: Never Used  . Alcohol Use: No   OB History   Grav Para Term Preterm Abortions TAB SAB Ect Mult Living   4 3 3  1     3      Review of Systems  Constitutional: Negative for fever and chills.  HENT: Positive for nosebleeds and postnasal  drip.   Respiratory: Negative for shortness of breath.   Cardiovascular: Negative for chest pain.  Gastrointestinal: Positive for nausea (After swallowing blood). Negative for abdominal pain.  Neurological: Negative for dizziness, tremors, weakness and light-headedness.      Allergies  Amiodarone; Crestor; Erythromycin; Meperidine hcl; Ropinirole hydrochloride; Atorvastatin; Codeine; Erythromycin base; Ezetimibe; Meperidine; Morphine; Neomycin-bacitracin zn-polymyx; Ropinirole; and Simvastatin  Home Medications   Prior to Admission medications   Medication Sig Start Date End Date Taking? Authorizing Provider  acetaminophen (TYLENOL) 325 MG tablet Take 650 mg by mouth every 6 (six) hours as needed for mild pain.   Yes Historical Provider, MD  aspirin EC 81 MG tablet Take 1 tablet (81 mg total) by mouth daily. 05/11/13  Yes Sherren Mocha, MD  cholecalciferol (VITAMIN D) 1000 UNITS tablet Take 1,000 Units by mouth daily.    Yes Historical Provider, MD  clorazepate (TRANXENE) 7.5 MG tablet Take 7.5 mg by mouth at bedtime.    Yes Historical Provider, MD  docusate sodium (COLACE) 50 MG capsule Take 50 mg by mouth 2 (two) times daily.   Yes Historical Provider, MD  dofetilide (TIKOSYN) 250 MCG capsule Take 250 mcg by mouth 2 (two) times daily.   Yes Historical Provider, MD  gabapentin (NEURONTIN) 300 MG capsule Take 600 mg by mouth at bedtime.   Yes Historical Provider, MD  glucosamine-chondroitin 500-400 MG tablet Take 1 tablet by mouth 2 (two) times daily.   Yes Historical Provider, MD  HYDROcodone-acetaminophen (NORCO/VICODIN) 5-325 MG per tablet Take 1 tablet by mouth every 6 (six) hours as needed for moderate pain.   Yes Historical Provider, MD  metoprolol tartrate (LOPRESSOR) 25 MG tablet Take 12.5 mg by mouth 2 (two) times daily.   Yes Historical Provider, MD  Multiple Vitamins-Minerals (EYE VITAMINS PO) Take 1 tablet by mouth daily.   Yes Historical Provider, MD  omeprazole (PRILOSEC) 20  MG capsule Take 20 mg by mouth 2 (two) times daily before a meal.   Yes Historical Provider, MD  Polyethylene Glycol 3350 (MIRALAX PO) Take 1 packet by mouth at bedtime.    Yes Historical Provider, MD  potassium chloride SA (K-DUR,KLOR-CON) 20 MEQ tablet Take 20 mEq by mouth daily.   Yes Historical Provider, MD  sertraline (ZOLOFT) 50 MG tablet Take 50 mg by mouth daily.   Yes Historical Provider, MD  spironolactone (ALDACTONE) 25 MG tablet Take 25 mg by  mouth daily.   Yes Historical Provider, MD  torsemide (DEMADEX) 20 MG tablet Take 60 mg by mouth daily.   Yes Historical Provider, MD  traMADol (ULTRAM) 50 MG tablet Take 1 tablet (50 mg total) by mouth 3 (three) times daily as needed for moderate pain. 02/18/14  Yes Noralee Space, MD  vitamin C (ASCORBIC ACID) 500 MG tablet Take 500 mg by mouth daily.   Yes Historical Provider, MD   BP 116/85  Pulse 53  Temp(Src) 97.5 F (36.4 C) (Oral)  Resp 20  Ht 5\' 7"  (1.702 m)  Wt 160 lb (72.576 kg)  BMI 25.05 kg/m2  SpO2 96% Physical Exam  Constitutional: She is oriented to person, place, and time. She appears well-developed and well-nourished.  Pleasant, accompanied by husband  HENT:  Head: Normocephalic and atraumatic.  Packing in place bilaterally. No active bleeding. Some crusted blood in nares and in posterior oropharynx, no active bright red bleeding in oropharynx.   Packing removed, no bleeding noted. Rhino Rockets were placed bilaterally, with 2-3 Afrin sprays and bacitracin gel placed in each nare.  Eyes: Conjunctivae and EOM are normal. Pupils are equal, round, and reactive to light.  Neck: Normal range of motion. Neck supple.  Cardiovascular: Normal rate and regular rhythm.   No murmur heard. Pulmonary/Chest: Effort normal and breath sounds normal.  Abdominal: Soft. Bowel sounds are normal.  Musculoskeletal: Normal range of motion. She exhibits no edema and no tenderness.  Neurological: She is alert and oriented to person, place, and  time. No cranial nerve deficit.  Skin: Skin is warm and dry.  Psychiatric: She has a normal mood and affect.    ED Course  Procedures (including critical care time) Labs Review Labs Reviewed - No data to display  Imaging Review No results found.   EKG Interpretation None      MDM   Final diagnoses:  Epistaxis    Patient with history of recurrent epistaxis, not on Coumadin, presenting with epistaxis. Rhino Rockets in place with no active bleeding. All vital signs stable. Blood loss not significant per patient report (1-2 cups).  2:28PM Patient re-examined, no bleeding from nares. No bleeding in posterior oropharynx. Patient is concerned about some small dark clots after wiping her throat, and spotting on tissue when wiping distal edge of RhinoRocket. She was reassured and given return precautions, and instructed not to wipe or pick her nose while packing is in place.  She can follow up with her ENT Dr. Janace Hoard as an outpatient for removal of packing. Patient advised to call for appointment in 3-4 days.  Lesly Dukes, MD 04/19/14 [phone]

## 2014-04-25 ENCOUNTER — Telehealth: Payer: Self-pay

## 2014-04-25 NOTE — Telephone Encounter (Signed)
Have her get a 3D tomagraphy, reviewed if they $50 chargebut a better screening total. Have her take Vit E twice daily

## 2014-04-25 NOTE — Telephone Encounter (Signed)
Opened in error

## 2014-04-25 NOTE — Telephone Encounter (Signed)
Patient saw you 01/22/14 for left breast pain. You sent her for diag mammo of left breast and report shows all normal and radiologist recommended she return 4 mos from her routine bilateral screening mammo.  She received a letter to call them.  However, patient said the same left breast pain continues and she thinks that you need to order some type diagnostic test to find out why her breast hurts.

## 2014-04-25 NOTE — Telephone Encounter (Signed)
Patient informed. She said she cannot take Vit E because it can cause bleeding issues and she has bleeding problems.

## 2014-04-26 ENCOUNTER — Other Ambulatory Visit: Payer: Self-pay | Admitting: Thoracic Surgery (Cardiothoracic Vascular Surgery)

## 2014-04-27 LAB — BUN: BUN: 23 mg/dL (ref 6–23)

## 2014-04-27 LAB — CREATININE, SERUM: Creat: 0.91 mg/dL (ref 0.50–1.10)

## 2014-04-29 ENCOUNTER — Ambulatory Visit (INDEPENDENT_AMBULATORY_CARE_PROVIDER_SITE_OTHER): Payer: Medicare Other | Admitting: Thoracic Surgery (Cardiothoracic Vascular Surgery)

## 2014-04-29 ENCOUNTER — Ambulatory Visit
Admission: RE | Admit: 2014-04-29 | Discharge: 2014-04-29 | Disposition: A | Discharge status: Home / Self Care | Payer: Medicare Other | Source: Ambulatory Visit | Attending: Thoracic Surgery (Cardiothoracic Vascular Surgery) | Admitting: Thoracic Surgery (Cardiothoracic Vascular Surgery)

## 2014-04-29 ENCOUNTER — Encounter: Payer: Self-pay | Admitting: Thoracic Surgery (Cardiothoracic Vascular Surgery)

## 2014-04-29 VITALS — BP 129/73 | HR 65 | Resp 16 | Ht 67.0 in | Wt 158.0 lb

## 2014-04-29 DIAGNOSIS — I71019 Dissection of thoracic aorta, unspecified: Secondary | ICD-10-CM

## 2014-04-29 DIAGNOSIS — I7101 Dissection of thoracic aorta: Secondary | ICD-10-CM

## 2014-04-29 MED ORDER — IOHEXOL 350 MG/ML SOLN
80.0000 mL | Freq: Once | INTRAVENOUS | Status: AC | PRN
  Administered 2014-04-29: 80 mL via INTRAVENOUS

## 2014-04-29 NOTE — Progress Notes (Signed)
[address]       [place] [address]             [phone]     CARDIOTHORACIC SURGERY OFFICE NOTE  Referring Provider is Sherren Mocha, MD PCP is Noralee Space, MD   HPI:  Patient returns for routine followup and surveillance nearly 4 years following emergency repair of acute type A aortic dissection. She was last seen here in the office on 05/14/2013.  She is followed chronically by Dr. Burt Knack for multivessel coronary artery disease, atrial fibrillation, tricuspid regurgitation, and chronic diastolic congestive heart failure. She has been maintaining sinus rhythm on tedious and therapy. She continues to have problems with recurrent epistaxis despite the fact that she is no longer on warfarin anticoagulation. She is disappointed by the fact that all of the ENT physicians in [place] tell her not to stop at [facility] and simply go directly to [facility] in [place] for treatment. From a cardiac standpoint she continues to do well. She has not had any chest discomfort, shortness of breath, or palpitations. She is limited by chronic osteoarthritis and weakness in her legs. She still has numbness in the C6 distribution of the right hand and forearm dating back to her aortic dissection surgery. This is likely related to the brachial plexus and axillary cannulation.  Her blood pressure is typically lower one measured in the right arm compared with the left, as would be expected based upon the chronic dissection involving the innominate artery. She states that she sometimes gets weakness and crampy sensations in her right forearm when she is writing with a pen.    Current Outpatient Prescriptions  Medication Sig Dispense Refill  . acetaminophen (TYLENOL) 325 MG tablet Take 650 mg by mouth every 6 (six) hours as needed for mild pain.      Marland Kitchen aspirin EC 81 MG tablet Take 1 tablet (81 mg total) by mouth daily.  1 tablet  0  . cholecalciferol (VITAMIN D) 1000 UNITS tablet Take  1,000 Units by mouth daily.       . clorazepate (TRANXENE) 7.5 MG tablet Take 7.5 mg by mouth at bedtime.       . docusate sodium (COLACE) 50 MG capsule Take 50 mg by mouth 2 (two) times daily.      Marland Kitchen dofetilide (TIKOSYN) 250 MCG capsule Take 250 mcg by mouth 2 (two) times daily.      Marland Kitchen gabapentin (NEURONTIN) 300 MG capsule Take 600 mg by mouth at bedtime.      Marland Kitchen glucosamine-chondroitin 500-400 MG tablet Take 1 tablet by mouth 2 (two) times daily.      Marland Kitchen HYDROcodone-acetaminophen (NORCO/VICODIN) 5-325 MG per tablet Take 1 tablet by mouth every 6 (six) hours as needed for moderate pain.      . metoprolol tartrate (LOPRESSOR) 25 MG tablet Take 12.5 mg by mouth 2 (two) times daily.      . Multiple Vitamins-Minerals (EYE VITAMINS PO) Take 1 tablet by mouth 2 (two) times daily.       Marland Kitchen omeprazole (PRILOSEC) 20 MG capsule Take 20 mg by mouth 2 (two) times daily before a meal.      . Polyethylene Glycol 3350 (MIRALAX PO) Take 1 packet by mouth at bedtime.       . potassium chloride SA (K-DUR,KLOR-CON) 20 MEQ tablet Take 20 mEq by mouth daily.      . sertraline (ZOLOFT) 50 MG tablet Take 50 mg by mouth daily.      Marland Kitchen  spironolactone (ALDACTONE) 25 MG tablet Take 25 mg by mouth daily.      Marland Kitchen torsemide (DEMADEX) 20 MG tablet Take 60 mg by mouth daily.      . traMADol (ULTRAM) 50 MG tablet Take 1 tablet (50 mg total) by mouth 3 (three) times daily as needed for moderate pain.  50 tablet  3  . vitamin C (ASCORBIC ACID) 500 MG tablet Take 500 mg by mouth daily.       No current facility-administered medications for this visit.      Physical Exam:   BP 129/73  Pulse 65  Resp 16  Ht 5\' 7"  (1.702 m)  Wt 158 lb (71.668 kg)  BMI 24.74 kg/m2  SpO2 97%  General:  Elderly but well-appearing  Chest:   Clear to auscultation  CV:   Regular rate and rhythm  Incisions:  n/a  Abdomen:  Soft and nontender  Extremities:  Warm and well-perfused  Diagnostic Tests:  CT ANGIOGRAPHY CHEST WITH CONTRAST    TECHNIQUE:  Multidetector CT imaging of the chest was performed using the  standard protocol during bolus administration of intravenous  contrast. Multiplanar CT image reconstructions and MIPs were  obtained to evaluate the vascular anatomy.  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  Thoracic aortic dissection appearance is stable with dissection flap  visible in the innominate artery and extending in the right  subclavian artery in a fashion similar to the prior exam. It is  difficult to differentiate the true lumen from the false lumen.  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 Overall degree of marginal thrombus and luminal  narrowing at the junction of the axillary and subclavian arteries is  not changed from 05/11/2013. No significant new dissection extension  is observed. Both the right carotid artery and right vertebral  artery arise from involved regions of the innominate and subclavian  arteries, respectively. Left vertebral artery arises from the left  subclavian artery.  Prior coronary bypass. Cardiomegaly with right heart and some left  heart enlargement. The celiac trunk and proximal SMA patent. Apical  thinning in the left ventricle. Internal mammary arteries patent.  A prior 6 mm right middle lobe nodule has reduced in size to 3 mm.  Other mild nodularity in the right middle lobe is stable. 0.7 x 0.4  cm right lower lobe nodule, image 44 of series 5, stable from  03/29/2011. 3 mm right basilar pulmonary nodule on image 51 of  series 5, similar to 03/29/2011. Nodularity along the left  hemidiaphragm is not changed from 2012 and other nodules in the left  lung in the 45 mm range likewise appear stable from 2012.  There is scarring along the minor fissure.  And the branching linear and nodular opacity  in the right upper lobe  on images 31-32 of series 5 is mostly stable, except for mild  increase in prominence of a 4 mm nodular component on image 32 of  series 5.  Review of the MIP images confirms the above findings.  IMPRESSION:  1. Dissection flap primarily involving the innominate artery and  right subclavian and right axillary arteries appear stable,  including the stenosis due to mural thrombosis at the junction of  the right subclavian and axillary arteries. 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  2. Most  of the pulmonary nodules are chronic and stable from 2012.  There is some slight increase in nodularity including a 4 mm nodular  component in the right upper lobe confluent branching and nodular  opacity shown on image 32. Although probably inflammatory, this  merits followup assessment by CT in 1 years time to assess for  change.  3. Additional chronic/incidental findings include thoracic neural  foraminal meningocele, and cardiomegaly with left ventricular apical  thinning.  Electronically Signed  By: Sherryl Barters M.D.  On: 04/29/2014 10:33     Impression:  The patient remains clinically stable with respect to her aortic dissection. The chronic dissection involving the innominate artery and origin of both carotid arteries appears stable and without significant aneurysmal enlargement.  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  Plan:  The patient will return in one year for followup CT angiogram of the chest  I spent in excess of 15 minutes during the conduct of this office consultation and >50% of this time involved direct face-to-face encounter with the patient for counseling and/or coordination of their care.   Valentina Gu. Roxy Manns, MD 04/29/2014 11:09 AM

## 2014-05-02 ENCOUNTER — Ambulatory Visit: Payer: Medicare Other

## 2014-05-02 ENCOUNTER — Other Ambulatory Visit (INDEPENDENT_AMBULATORY_CARE_PROVIDER_SITE_OTHER): Payer: Medicare Other

## 2014-05-02 DIAGNOSIS — I482 Chronic atrial fibrillation, unspecified: Secondary | ICD-10-CM

## 2014-05-02 DIAGNOSIS — I10 Essential (primary) hypertension: Secondary | ICD-10-CM

## 2014-05-02 DIAGNOSIS — I2510 Atherosclerotic heart disease of native coronary artery without angina pectoris: Secondary | ICD-10-CM

## 2014-05-02 DIAGNOSIS — I4891 Unspecified atrial fibrillation: Secondary | ICD-10-CM

## 2014-05-02 LAB — CBC WITH DIFFERENTIAL/PLATELET
BASOS ABS: 0 10*3/uL (ref 0.0–0.1)
Basophils Relative: 0.8 % (ref 0.0–3.0)
EOS ABS: 0.1 10*3/uL (ref 0.0–0.7)
Eosinophils Relative: 2.3 % (ref 0.0–5.0)
HCT: 32.5 % — ABNORMAL LOW (ref 36.0–46.0)
Hemoglobin: 10.7 g/dL — ABNORMAL LOW (ref 12.0–15.0)
LYMPHS ABS: 1.2 10*3/uL (ref 0.7–4.0)
Lymphocytes Relative: 26.5 % (ref 12.0–46.0)
MCHC: 32.9 g/dL (ref 30.0–36.0)
MCV: 92.6 fl (ref 78.0–100.0)
MONO ABS: 0.4 10*3/uL (ref 0.1–1.0)
Monocytes Relative: 9.3 % (ref 3.0–12.0)
NEUTROS PCT: 61.1 % (ref 43.0–77.0)
Neutro Abs: 2.8 10*3/uL (ref 1.4–7.7)
PLATELETS: 181 10*3/uL (ref 150.0–400.0)
RBC: 3.51 Mil/uL — ABNORMAL LOW (ref 3.87–5.11)
RDW: 14.1 % (ref 11.5–15.5)
WBC: 4.6 10*3/uL (ref 4.0–10.5)

## 2014-05-02 LAB — BASIC METABOLIC PANEL
BUN: 32 mg/dL — AB (ref 6–23)
CO2: 33 mEq/L — ABNORMAL HIGH (ref 19–32)
CREATININE: 0.8 mg/dL (ref 0.4–1.2)
Calcium: 9.2 mg/dL (ref 8.4–10.5)
Chloride: 99 mEq/L (ref 96–112)
GFR: 70.93 mL/min (ref >60.00)
GLUCOSE: 105 mg/dL — AB (ref 70–99)
POTASSIUM: 3.8 meq/L (ref 3.5–5.1)
Sodium: 139 mEq/L (ref 135–145)

## 2014-05-10 ENCOUNTER — Other Ambulatory Visit: Payer: Self-pay

## 2014-05-10 DIAGNOSIS — Z1231 Encounter for screening mammogram for malignant neoplasm of breast: Secondary | ICD-10-CM

## 2014-05-15 ENCOUNTER — Encounter: Payer: Self-pay | Admitting: Internal Medicine

## 2014-05-15 ENCOUNTER — Ambulatory Visit (INDEPENDENT_AMBULATORY_CARE_PROVIDER_SITE_OTHER): Payer: Medicare Other | Admitting: Internal Medicine

## 2014-05-15 VITALS — BP 128/80 | HR 69 | Temp 97.5°F | Ht 67.0 in | Wt 163.1 lb

## 2014-05-15 DIAGNOSIS — M7989 Other specified soft tissue disorders: Secondary | ICD-10-CM

## 2014-05-15 DIAGNOSIS — M79609 Pain in unspecified limb: Secondary | ICD-10-CM

## 2014-05-15 DIAGNOSIS — R7309 Other abnormal glucose: Secondary | ICD-10-CM

## 2014-05-15 DIAGNOSIS — M79674 Pain in right toe(s): Secondary | ICD-10-CM

## 2014-05-15 DIAGNOSIS — I2510 Atherosclerotic heart disease of native coronary artery without angina pectoris: Secondary | ICD-10-CM

## 2014-05-15 DIAGNOSIS — I10 Essential (primary) hypertension: Secondary | ICD-10-CM

## 2014-05-15 DIAGNOSIS — D62 Acute posthemorrhagic anemia: Secondary | ICD-10-CM

## 2014-05-15 DIAGNOSIS — Z23 Encounter for immunization: Secondary | ICD-10-CM

## 2014-05-15 NOTE — Assessment & Plan Note (Signed)
stable overall by history and exam, recent data reviewed with pt, and pt to continue medical treatment as before,  to f/u any worsening symptoms or concerns Lab Results  Component Value Date   HGBA1C 5.9 10/24/2013

## 2014-05-15 NOTE — Progress Notes (Addendum)
Subjective:    Patient ID: [name], female    DOB: [birth year], 79 y.o.   MRN: [id number]  HPI  Here for wellness exam/yearly exam and transfer to new PCP;  Overall doing ok;  Pt denies CP, worsening SOB, DOE, wheezing, orthopnea, PND, worsening LE edema, palpitations, dizziness or syncope.  Pt denies neurological change such as new headache, facial or extremity weakness.  Pt denies polydipsia, polyuria, or low sugar symptoms. Pt states overall good compliance with treatment and medications, good tolerability, and has been trying to follow lower cholesterol diet.  Pt denies worsening depressive symptoms, suicidal ideation or panic. No fever, night sweats, wt loss, loss of appetite, or other constitutional symptoms.  Pt states good ability with ADL's, has low fall risk, home safety reviewed and adequate, no other significant changes in hearing or vision, and walked 1 mile for excercise today. S/p mult cortisone to knee/hips,, sees Dr Janelle Floor management..  Has declined left shoulder surgury.  Due for colonoscopy - declines for now, plans to consider later.  Not coumadin candidate due to severe recurrent nasal bleeding. Still with some nosebleeds, last approx 3 wks ago, packed at ER, advised to go to [facility] per Dr Verita Schneiders, re-packed there,  Eventually packing removed, now doing nasal saline. Last hgb over 10, only on asa, has been > 12 in feb 2015.   Had recent squamous cell ca  - leg removed mar 2015, s/p mult others off in past, has f/u with derm.  Today c/o right 4th toe red/swelling without pain since started with pain 1 mo ago standing outside, ? Insect bite related but not clear. No fever, or pain or trauma, or hx of gout.  Has periph neuropathy with decreased feet pain.  Sleeping better with clonazepam and 3 tylenol and neurontin qhs.  Not using cpap - only tried for 1 mo after diagnosis Past Medical History  Diagnosis Date  . OSA (obstructive sleep apnea)   . HTN (hypertension)    . Atrial fibrillation     paroxysmal; on coumadin  . CAD (coronary artery disease)     a. cath 7/11: LM 40%, mild plaque disease in CFX, LAD, and RCA;  b. 2011 CABG with S-LAD and S-OM done at time of Aortic dissection repair  . Chronic diastolic CHF (congestive heart failure)     a. 08/2011 Echo: EF 55-60%, PASP 64mmHg  . Dissection of aorta, thoracic     a. Type A; s/p repair 7/11 with aortic root repair and CABG x 2   . ASD (atrial septal defect)     a. s/p repair [facility]  . Right heart failure     a. 2/2 TR and RV dysfxn;  b. echo 4/12: EF 60%, mild LVH, mild AI, mild MR, mod LAE, mod RVE with mod dec. RVSF, mod RAE, mod to severe TR, PASP 58;  c. right heart cath 4/12:  RA mean 8, RV 46/1 with mean 6, PA 45/13 with mean 26, PCWP mean 14, CO 3.68, CI 2.1 (no sig pulmon HTN)  . GERD (gastroesophageal reflux disease)   . IBS (irritable bowel syndrome)   . Esophageal stricture   . Colonic polyp   . Peripheral neuropathy   . Back pain   . Fibromyalgia   . Anxiety   . HLD (hyperlipidemia)   . Borderline diabetes   . History of TIAs   . DJD (degenerative joint disease)   . Normocytic anemia   . Thrombocytopenia   .  Macular degeneration   . Depression   . Esophageal dysmotilities    Past Surgical History  Procedure Laterality Date  . Asd repair  1982    [facility]  . Rotator cuff repair  2007    right  . Cardioversion      x 3  . Tubal ligation    . Appendectomy    . Vaginal hysterectomy  1987    A/P Repair With Cystocele and rectocele repair  . Tonsillectomy    . Oophorectomy  1994    BSO  . Pelvic laparoscopy  1994  . Emergency redo median sternotomy for hemiarch repair of acute type a aortic  dissection  06/05/2010  . Nasal hemorrhage control  12/27/2011    Procedure: EPISTAXIS CONTROL;  Surgeon: Ruby Cola, MD;  Location: [facility] OR;  Service: ENT;  Laterality: N/A;    reports that she has never smoked. She has never used smokeless tobacco. She reports that she does  not drink alcohol or use illicit drugs. family history includes ALS in her mother; Colon cancer in her sister; Diabetes in her mother; Heart attack in her brother; Heart disease in her brother; Hypertension in her mother; Lung cancer in her brother; Melanoma in her sister; Osteoporosis in her sister; Pancreatic cancer in her sister. Allergies  Allergen Reactions  . Amiodarone     Severe side effects per Pt--  . Crestor [Rosuvastatin] Other (See Comments)    Muscle weakness  . Erythromycin Other (See Comments)    unknown  . Meperidine Hcl Other (See Comments)    REACTION: dizziness  . Ropinirole Hydrochloride     REACTION: INTOL to Requip w/ sleep paralysis  . Atorvastatin Rash    REACTION: muscle pain  . Codeine Rash    REACTION: itching  . Erythromycin Base Rash  . Ezetimibe Rash    REACTION: INTOL to Zetia w/ cough  . Meperidine Rash  . Morphine Other (See Comments) and Rash    Pt states it "makes her crazy"  . Neomycin-Bacitracin Zn-Polymyx Other (See Comments) and Rash    blisters  . Ropinirole Rash  . Simvastatin Rash    REACTION: unable to walk--muscle pain   Current Outpatient Prescriptions on File Prior to Visit  Medication Sig Dispense Refill  . acetaminophen (TYLENOL) 325 MG tablet Take 650 mg by mouth every 6 (six) hours as needed for mild pain.      Marland Kitchen aspirin EC 81 MG tablet Take 1 tablet (81 mg total) by mouth daily.  1 tablet  0  . cholecalciferol (VITAMIN D) 1000 UNITS tablet Take 1,000 Units by mouth daily.       . clorazepate (TRANXENE) 7.5 MG tablet Take 7.5 mg by mouth at bedtime.       . docusate sodium (COLACE) 50 MG capsule Take 50 mg by mouth 2 (two) times daily.      Marland Kitchen dofetilide (TIKOSYN) 250 MCG capsule Take 250 mcg by mouth 2 (two) times daily.      Marland Kitchen gabapentin (NEURONTIN) 300 MG capsule Take 600 mg by mouth at bedtime.      Marland Kitchen glucosamine-chondroitin 500-400 MG tablet Take 1 tablet by mouth 2 (two) times daily.      Marland Kitchen HYDROcodone-acetaminophen  (NORCO/VICODIN) 5-325 MG per tablet Take 1 tablet by mouth every 6 (six) hours as needed for moderate pain.      . metoprolol tartrate (LOPRESSOR) 25 MG tablet Take 12.5 mg by mouth 2 (two) times daily.      Marland Kitchen  Multiple Vitamins-Minerals (EYE VITAMINS PO) Take 1 tablet by mouth 2 (two) times daily.       Marland Kitchen omeprazole (PRILOSEC) 20 MG capsule Take 20 mg by mouth 2 (two) times daily before a meal.      . Polyethylene Glycol 3350 (MIRALAX PO) Take 1 packet by mouth at bedtime.       . potassium chloride SA (K-DUR,KLOR-CON) 20 MEQ tablet Take 20 mEq by mouth daily.      . sertraline (ZOLOFT) 50 MG tablet Take 50 mg by mouth daily.      Marland Kitchen spironolactone (ALDACTONE) 25 MG tablet Take 25 mg by mouth daily.      Marland Kitchen torsemide (DEMADEX) 20 MG tablet Take 60 mg by mouth daily.      . traMADol (ULTRAM) 50 MG tablet Take 1 tablet (50 mg total) by mouth 3 (three) times daily as needed for moderate pain.  50 tablet  3  . vitamin C (ASCORBIC ACID) 500 MG tablet Take 500 mg by mouth daily.       No current facility-administered medications on file prior to visit.    Review of Systems  Constitutional: Negative for unusual diaphoresis or other sweats  HENT: Negative for ringing in ear Eyes: Negative for double vision or worsening visual disturbance.  Respiratory: Negative for choking and stridor.   Gastrointestinal: Negative for vomiting or other signifcant bowel change Genitourinary: Negative for hematuria or decreased urine volume.  Musculoskeletal: Negative for other MSK pain or swelling Skin: Negative for color change and worsening wound.  Neurological: Negative for tremors and numbness other than noted  Psychiatric/Behavioral: Negative for decreased concentration or agitation other than above       Objective:   Physical Exam BP 128/80  Pulse 69  Temp(Src) 97.5 F (36.4 C) (Oral)  Ht 5\' 7"  (1.702 m)  Wt 163 lb 2 oz (73.993 kg)  BMI 25.54 kg/m2  SpO2 98% VS noted,  Constitutional: Pt appears  well-developed, well-nourished.  HENT: Head: NCAT.  Right Ear: External ear normal.  Left Ear: External ear normal.  Eyes: . Pupils are equal, round, and reactive to light. Conjunctivae and EOM are normal Neck: Normal range of motion. Neck supple.  Cardiovascular: Normal rate and regular rhythm.   Pulmonary/Chest: Effort normal and breath sounds normal.  Abd:  Soft, NT, ND, + BS Neurological: Pt is alert. Not confused , motor grossly intact Skin: Skin is warm. No rash; right 4th toe with 1+ nontender erythema/soft tissue swelling to mid dorsal aspect  Psychiatric: Pt behavior is normal. No agitation. mild nervous    Assessment & Plan:

## 2014-05-15 NOTE — Patient Instructions (Addendum)
You had the new Prevnar pneumonia shot today  Please watch the 4th toe on right feet , as you may not be having pain due to the nerve damage.  Please call if you would consider a referral to podiatry.    Please continue all other medications as before, and refills have been done if requested.  Please have the pharmacy call with any other refills you may need.  Please continue your efforts at being more active, low cholesterol diet, and weight control.  You are otherwise up to date with prevention measures today.  Please keep your appointments with your specialists as you may have planned  We can hold on further lab work today  Please return in 6 months, or sooner if needed

## 2014-05-15 NOTE — Addendum Note (Signed)
Addended by: Sharon Seller B on: 05/15/2014 02:21 PM   Modules accepted: Orders

## 2014-05-15 NOTE — Assessment & Plan Note (Signed)
Little pain due to neuoropathy most likely, suspect irritate toe related to shoewear, declines podiatry or other tx at this time

## 2014-05-15 NOTE — Assessment & Plan Note (Signed)
stable overall by history and exam, recent data reviewed with pt, and pt to continue medical treatment as before,  to f/u any worsening symptoms or concerns BP Readings from Last 3 Encounters:  05/15/14 128/80  04/29/14 129/73  04/19/14 106/76

## 2014-05-15 NOTE — Assessment & Plan Note (Signed)
With recnet nose bleed, stable overall by history and exam, recent data reviewed with pt, and pt to continue medical treatment as before,  to f/u any worsening symptoms or concerns Lab Results  Component Value Date   WBC 4.6 05/02/2014   HGB 10.7* 05/02/2014   HCT 32.5* 05/02/2014   MCV 92.6 05/02/2014   PLT 181.0 05/02/2014   Declines further lab at this time

## 2014-05-15 NOTE — Progress Notes (Signed)
Pre visit review using our clinic review tool, if applicable. No additional management support is needed unless otherwise documented below in the visit note. 

## 2014-05-16 ENCOUNTER — Telehealth: Payer: Self-pay | Admitting: Internal Medicine

## 2014-05-16 NOTE — Telephone Encounter (Signed)
Relevant patient education assigned to patient using Emmi. ° °

## 2014-05-21 ENCOUNTER — Telehealth: Payer: Self-pay | Admitting: Cardiovascular Disease

## 2014-05-21 NOTE — Telephone Encounter (Signed)
New Message  Pt called reports that she is having problems swallowing and is due for an Endoscopy and a Colonoscopy. Pt asks if these procedures are safe for her to have. Pt also requests a call back to discuss if she should have these procedure in the hospital or in a free standing unit. Please call back to discuss.

## 2014-05-21 NOTE — Telephone Encounter (Signed)
[email or web address]

## 2014-05-21 NOTE — Telephone Encounter (Signed)
The pt states that it is time for her to have a colonoscopy and a EGD. She has not scheduled these procedures yet as she has concerns about her heart. She wants to know if Dr Burt Knack thinks her heart is okay to have procedures and if so should she stop taking any of her meds. Please advise.

## 2014-05-21 NOTE — Telephone Encounter (Signed)
**Note De-identified [name] Obfuscation** The pt is advised and she verbalized understanding. 

## 2014-05-21 NOTE — Telephone Encounter (Signed)
She is at low risk of having these done if they are necessary. No need to stop any cardiac meds as she is not on any blood thinning drugs. thx

## 2014-05-21 NOTE — Telephone Encounter (Signed)
Follow up ° ° ° ° °Returning a nurses call °

## 2014-05-23 ENCOUNTER — Other Ambulatory Visit: Payer: Self-pay | Admitting: Pulmonary Disease

## 2014-06-12 ENCOUNTER — Telehealth: Payer: Self-pay | Admitting: Cardiovascular Disease

## 2014-06-12 NOTE — Telephone Encounter (Signed)
lmtcb Debbie Prisha Hiley RN  

## 2014-06-12 NOTE — Telephone Encounter (Signed)
New message  Pt called states that she has had back pains... Orthopedic Dr. Requests to complete a spinal injection. Would it be ok for her to come off of her Asprin? Or is it safe for her to have the injection.  Please call back to discuss further.

## 2014-06-13 ENCOUNTER — Encounter: Payer: Self-pay | Admitting: Internal Medicine

## 2014-06-13 ENCOUNTER — Telehealth: Payer: Self-pay | Admitting: Internal Medicine

## 2014-06-13 ENCOUNTER — Telehealth: Payer: Self-pay | Admitting: Cardiovascular Disease

## 2014-06-13 NOTE — Telephone Encounter (Signed)
New message ° ° ° ° ° °Returning a nurses call from yesterday °

## 2014-06-13 NOTE — Telephone Encounter (Signed)
Left pt a message to call back. 

## 2014-06-13 NOTE — Telephone Encounter (Signed)
Spoke with patient and she is past due on her colonoscopy. She was due Sept. 2014. Last colonoscopy was 2009.

## 2014-06-14 ENCOUNTER — Telehealth: Payer: Self-pay | Admitting: Internal Medicine

## 2014-06-14 MED ORDER — TRAMADOL HCL 50 MG PO TABS
50.0000 mg | ORAL_TABLET | Freq: Three times a day (TID) | ORAL | Status: DC | PRN
Start: 2014-06-14 — End: 2014-09-18

## 2014-06-14 NOTE — Telephone Encounter (Signed)
Spoke with pt & she is not scheduled for her back injection. She would like to know if she can stop her Aspirin for 5 days prior to injection. Planning on having done at the surgical [facility] She is also having a colonoscopy in August & will need to stop aspirin again.  Forwarded to Dr. Sondra Come RN

## 2014-06-14 NOTE — Telephone Encounter (Signed)
I spoke with pt & she is aware. Horton Chin RN

## 2014-06-14 NOTE — Telephone Encounter (Signed)
Notified pt md only approve for 1 every 8 hours on her tramadol. [facility].../lmb

## 2014-06-14 NOTE — Telephone Encounter (Signed)
Follow up     Pt needs a back injection by Dr Nelva Bush.  Can she stop her aspirin 5 days prior to injection?

## 2014-06-14 NOTE — Telephone Encounter (Signed)
Error Horton Chin RN

## 2014-06-14 NOTE — Telephone Encounter (Signed)
Yes - she can hold ASA 5 days prior thx

## 2014-06-14 NOTE — Telephone Encounter (Signed)
Pt request refill tramadol, pt stated Dr. Jenny Reichmann ask her last ov if she need this med but she didn't think she need it. Please advise, pt request some this med. Pt is aware Dr. Jenny Reichmann is not here.

## 2014-06-14 NOTE — Telephone Encounter (Signed)
#  30  1 q 8 hrs prn

## 2014-06-18 ENCOUNTER — Ambulatory Visit
Admission: RE | Admit: 2014-06-18 | Discharge: 2014-06-18 | Disposition: A | Discharge status: Home / Self Care | Payer: Medicare Other | Source: Ambulatory Visit

## 2014-06-18 DIAGNOSIS — Z1231 Encounter for screening mammogram for malignant neoplasm of breast: Secondary | ICD-10-CM

## 2014-06-26 ENCOUNTER — Telehealth: Payer: Self-pay | Admitting: *Deleted

## 2014-06-26 NOTE — Telephone Encounter (Signed)
Left msg on triage requesting new rx on her clorazepate was originally rx by Dr. Lenna Gilford...[name]

## 2014-06-27 MED ORDER — CLORAZEPATE DIPOTASSIUM 7.5 MG PO TABS: 7.5000 mg | ORAL_TABLET | Freq: Every evening | ORAL | Status: DC | PRN

## 2014-06-27 NOTE — Telephone Encounter (Signed)
Fax[facility] to [facility] [address] [place] 

## 2014-06-27 NOTE — Telephone Encounter (Signed)
Done hardcopy to robin  

## 2014-06-27 NOTE — Telephone Encounter (Signed)
06/27/14 fax from pharmacy requesting refill on Clorazepate originally prescribed by Dr. Lenna Gilford.

## 2014-07-13 ENCOUNTER — Other Ambulatory Visit: Payer: Self-pay | Admitting: Pulmonary Disease

## 2014-07-15 ENCOUNTER — Ambulatory Visit (AMBULATORY_SURGERY_CENTER): Payer: Self-pay

## 2014-07-15 ENCOUNTER — Telehealth: Payer: Self-pay

## 2014-07-15 VITALS — Ht 64.5 in | Wt 164.0 lb

## 2014-07-15 DIAGNOSIS — Z860100 Personal history of colon polyps, unspecified: Secondary | ICD-10-CM

## 2014-07-15 MED ORDER — MOVIPREP 100 G PO SOLR: 1.0000 | Freq: Once | ORAL | Status: DC

## 2014-07-15 NOTE — Progress Notes (Signed)
No allergies to eggs or soy No past problems with anesthesia No home oxygen No diet/weight loss meds  Has email  Emmi instructions given for colonoscopy 

## 2014-07-15 NOTE — Telephone Encounter (Signed)
Pt wants egd as well as colonoscopy d/t symptoms reoccurring (dysphagia is back).

## 2014-07-17 ENCOUNTER — Other Ambulatory Visit: Payer: Self-pay | Admitting: Pulmonary Disease

## 2014-07-18 ENCOUNTER — Telehealth: Payer: Self-pay | Admitting: Internal Medicine

## 2014-07-18 NOTE — Telephone Encounter (Signed)
Pt wanted to know if she needed to stop Aspirin before procedure.  Informed pt no need to stop Aspirin before procedure

## 2014-07-18 NOTE — Telephone Encounter (Signed)
Called pt to schedule egd/colon together.  She was not available.  Left message with husband for her to call to reschedule.

## 2014-07-18 NOTE — Telephone Encounter (Signed)
Last EGD 2011, no stricture but barium passed with delay. OK to schedule EGD/colon in one day.

## 2014-07-19 ENCOUNTER — Other Ambulatory Visit: Payer: Self-pay | Admitting: Pulmonary Disease

## 2014-07-23 ENCOUNTER — Other Ambulatory Visit: Payer: Self-pay | Admitting: Internal Medicine

## 2014-07-23 NOTE — Telephone Encounter (Signed)
antibx are no longer routinely used before dental procedures  Does she have a specific reason for this? thanks

## 2014-07-23 NOTE — Telephone Encounter (Signed)
Pt left msg on triage stating her rx for amoxillan has expired. She has to take prior to her dental procedure. Has appt for Thurs...Johny Chess

## 2014-07-24 ENCOUNTER — Other Ambulatory Visit: Payer: Self-pay | Admitting: *Deleted

## 2014-07-24 ENCOUNTER — Other Ambulatory Visit: Payer: Self-pay | Admitting: Pulmonary Disease

## 2014-07-24 MED ORDER — AMOXICILLIN 500 MG PO CAPS: ORAL_CAPSULE | ORAL | Status: DC

## 2014-07-24 MED ORDER — SERTRALINE HCL 50 MG PO TABS: 50.0000 mg | ORAL_TABLET | Freq: Every day | ORAL | Status: DC

## 2014-07-24 NOTE — Telephone Encounter (Signed)
Called the patient informed script sent in.

## 2014-07-24 NOTE — Telephone Encounter (Signed)
Called the patient no answer on either number and no voice mail to leave a message.

## 2014-07-24 NOTE — Telephone Encounter (Signed)
Called the patient and she stated she has a prolapse tricuspid valve, has had aortic disection and asd repair and her cardiologist requires an antibiotic before a dental procedure.  Please send in asap as she is having a procedure in the moring,

## 2014-07-24 NOTE — Telephone Encounter (Signed)
[facility]. Inform pt pharmacy sent request to Dr. Lenna Gilford, but will send refill.Marland KitchenJohny Chess

## 2014-07-24 NOTE — Telephone Encounter (Signed)
dOne erx 

## 2014-07-24 NOTE — Addendum Note (Signed)
Addended by: Biagio Borg on: 07/24/2014 01:16 PM   Modules accepted: Orders

## 2014-07-25 ENCOUNTER — Other Ambulatory Visit: Payer: Self-pay | Admitting: Pulmonary Disease

## 2014-07-29 ENCOUNTER — Encounter: Payer: Medicare Other | Admitting: Internal Medicine

## 2014-08-01 ENCOUNTER — Other Ambulatory Visit: Payer: Self-pay | Admitting: Pulmonary Disease

## 2014-08-01 MED ORDER — SPIRONOLACTONE 25 MG PO TABS: 25.0000 mg | ORAL_TABLET | Freq: Every day | ORAL | Status: DC

## 2014-08-27 ENCOUNTER — Encounter: Payer: Self-pay | Admitting: Family

## 2014-08-27 ENCOUNTER — Ambulatory Visit (INDEPENDENT_AMBULATORY_CARE_PROVIDER_SITE_OTHER): Payer: Medicare Other | Admitting: Family

## 2014-08-27 VITALS — BP 150/80 | HR 66 | Temp 97.8°F | Resp 18 | Ht 66.0 in | Wt 163.8 lb

## 2014-08-27 DIAGNOSIS — I2510 Atherosclerotic heart disease of native coronary artery without angina pectoris: Secondary | ICD-10-CM

## 2014-08-27 DIAGNOSIS — J329 Chronic sinusitis, unspecified: Secondary | ICD-10-CM | POA: Insufficient documentation

## 2014-08-27 DIAGNOSIS — J019 Acute sinusitis, unspecified: Secondary | ICD-10-CM | POA: Insufficient documentation

## 2014-08-27 MED ORDER — AMOXICILLIN-POT CLAVULANATE 875-125 MG PO TABS: 1.0000 | ORAL_TABLET | Freq: Two times a day (BID) | ORAL | Status: DC

## 2014-08-27 NOTE — Progress Notes (Signed)
Pre visit review using our clinic review tool, if applicable. No additional management support is needed unless otherwise documented below in the visit note. 

## 2014-08-27 NOTE — Patient Instructions (Signed)
Thank you for choosing [facility].  Summary/Instructions:   Your prescription has been sent to your pharmacy. Please be sure to take the complete dose of antibiotics.  You can use over the counter medications to help with your symptom relief. For coughing you can try Delsym or Robutussin. For fever you can use Tylenol. Mucinex as needed  Continue to drink lots of fluids, sleep with a humidifier, and saline nasal washes/sprays as needed.   You should experience improvement in 2-3 days. Please let us know if your symptoms worsen or fail to improve.

## 2014-08-27 NOTE — Progress Notes (Signed)
Subjective:    Patient ID: [name], female    DOB: Sep 19, 1935, 79 y.o.   MRN: [id number]  HPI:  [name] is a 79 y.o. female who presents today for an acute office visit.   About a week ago had a round of laryngitis and lost her voice which returned a couple of days later. Now has developed a cough and sinus pressure and aching. Coughing up a little bit of sputum. Thick and yellowish/brown. Denies any fevers, chills, sore throat or night sweats. Has tried saline wash and was able to get some mucus out. Has tried over the counter Mucinex.   Allergies  Allergen Reactions  . Amiodarone     Severe side effects per Pt--  . Crestor [Rosuvastatin] Other (See Comments)    Muscle weakness  . Erythromycin Other (See Comments)    unknown  . Meperidine Hcl Other (See Comments)    REACTION: dizziness  . Ropinirole Hydrochloride     REACTION: INTOL to Requip w/ sleep paralysis  . Atorvastatin Rash    REACTION: muscle pain  . Codeine Rash    REACTION: itching  . Erythromycin Base Rash  . Ezetimibe Rash    REACTION: INTOL to Zetia w/ cough  . Meperidine Rash  . Morphine Other (See Comments) and Rash    Pt states it "makes her crazy"  . Neomycin-Bacitracin Zn-Polymyx Other (See Comments) and Rash    blisters  . Ropinirole Rash  . Simvastatin Rash    REACTION: unable to walk--muscle pain   Current Outpatient Prescriptions on File Prior to Visit  Medication Sig Dispense Refill  . acetaminophen (TYLENOL) 325 MG tablet Take 650 mg by mouth every 6 (six) hours as needed for mild pain.      Marland Kitchen amoxicillin (AMOXIL) 500 MG capsule TAKE 4 CAPSULES BY MOUTH 1 HOUR PRIOR TO DENTAL PROCEDURES.  4 capsule  0  . aspirin EC 81 MG tablet Take 1 tablet (81 mg total) by mouth daily.  1 tablet  0  . cholecalciferol (VITAMIN D) 1000 UNITS tablet Take 1,000 Units by mouth daily.       . clorazepate (TRANXENE) 7.5 MG tablet Take 1 tablet (7.5 mg total) by mouth at bedtime as needed.  90  tablet  1  . docusate sodium (COLACE) 50 MG capsule Take 50 mg by mouth 2 (two) times daily.      Marland Kitchen dofetilide (TIKOSYN) 250 MCG capsule Take 250 mcg by mouth 2 (two) times daily.      Marland Kitchen gabapentin (NEURONTIN) 300 MG capsule Take 600 mg by mouth at bedtime.      Marland Kitchen glucosamine-chondroitin 500-400 MG tablet Take 1 tablet by mouth 2 (two) times daily.      Marland Kitchen HYDROcodone-acetaminophen (NORCO/VICODIN) 5-325 MG per tablet Take 1 tablet by mouth every 6 (six) hours as needed for moderate pain.      . metoprolol tartrate (LOPRESSOR) 25 MG tablet Take 12.5 mg by mouth 2 (two) times daily.      Marland Kitchen MOVIPREP 100 G SOLR Take 1 kit (200 g total) by mouth once.  1 kit  0  . Multiple Vitamins-Minerals (EYE VITAMINS PO) Take 1 tablet by mouth 2 (two) times daily.       Marland Kitchen omeprazole (PRILOSEC) 20 MG capsule Take 20 mg by mouth 2 (two) times daily before a meal.      . Polyethylene Glycol 3350 (MIRALAX PO) Take 1 packet by mouth at bedtime.       Marland Kitchen  potassium chloride SA (K-DUR,KLOR-CON) 20 MEQ tablet Take 20 mEq by mouth daily.      . sertraline (ZOLOFT) 50 MG tablet Take 1 tablet (50 mg total) by mouth daily.  90 tablet  3  . spironolactone (ALDACTONE) 25 MG tablet Take 1 tablet (25 mg total) by mouth daily.  30 tablet  6  . torsemide (DEMADEX) 20 MG tablet Take 60 mg by mouth daily.      Marland Kitchen torsemide (DEMADEX) 20 MG tablet TAKE 3 TABLETS BY MOUTH DAILY AS DIRECTED  90 tablet  5  . traMADol (ULTRAM) 50 MG tablet Take 1 tablet (50 mg total) by mouth every 8 (eight) hours as needed for moderate pain.  30 tablet  0  . vitamin C (ASCORBIC ACID) 500 MG tablet Take 500 mg by mouth daily.       No current facility-administered medications on file prior to visit.     Review of Systems  See HPI    Objective:    BP 150/80  Pulse 66  Temp(Src) 97.8 F (36.6 C) (Oral)  Resp 18  Ht _0  (1.676 m)  Wt 163 lb 12.8 oz (74.299 kg)  BMI 26.45 kg/m2  SpO2 97%  Nursing note and vital signs reviewed.  Physical Exam    Constitutional: She is oriented to person, place, and time. She appears well-developed and well-nourished. No distress.  Neurological: She is alert and oriented to person, place, and time.  Skin: Skin is warm and dry.  Psychiatric: She has a normal mood and affect. Her behavior is normal. Judgment and thought content normal.        Assessment & Plan:

## 2014-08-27 NOTE — Assessment & Plan Note (Signed)
Start Augmentin. Drink plenty of fluids. Use OTC medications as needed for symptom relief. Continue saline wash/sprays as needed. Follow up if symptoms worsen or fail to improve.

## 2014-08-29 ENCOUNTER — Other Ambulatory Visit: Payer: Self-pay | Admitting: Dermatology

## 2014-08-30 ENCOUNTER — Other Ambulatory Visit: Payer: Self-pay

## 2014-09-09 ENCOUNTER — Telehealth: Payer: Self-pay | Admitting: Internal Medicine

## 2014-09-09 ENCOUNTER — Other Ambulatory Visit: Payer: Self-pay | Admitting: Pulmonary Disease

## 2014-09-09 NOTE — Telephone Encounter (Signed)
No charge but we have 2 openings for Wed which will have to be filled, please tell [facility] Set[facility]  That. I will try to fill them from my office tomorrow or from PA's schedule if they see my patient today.

## 2014-09-11 ENCOUNTER — Encounter: Payer: Medicare Other | Admitting: Internal Medicine

## 2014-09-12 ENCOUNTER — Telehealth: Payer: Self-pay | Admitting: Internal Medicine

## 2014-09-12 NOTE — Telephone Encounter (Signed)
[facility] by patient. Omeprazole 20 mg two times daily, Spirolactone 25 mg 1 daily, Gabapentin 300 mg two at bed,Metopropol Tartarate 25 mg 1/2 tab two daily, Zoloft 50 mg 1 a day, Tramadol every 6 hours as needed.  [phone] phone [phone] fax.

## 2014-09-16 ENCOUNTER — Encounter: Payer: Self-pay | Admitting: Family

## 2014-09-18 ENCOUNTER — Other Ambulatory Visit: Payer: Self-pay

## 2014-09-18 ENCOUNTER — Other Ambulatory Visit: Payer: Self-pay | Admitting: Internal Medicine

## 2014-09-18 MED ORDER — TRAMADOL HCL 50 MG PO TABS: 50.0000 mg | ORAL_TABLET | Freq: Three times a day (TID) | ORAL | Status: DC | PRN

## 2014-09-18 NOTE — Telephone Encounter (Signed)
Done hardcopy to robin  

## 2014-09-19 ENCOUNTER — Other Ambulatory Visit: Payer: Self-pay | Admitting: Dermatology

## 2014-09-19 NOTE — Telephone Encounter (Signed)
Faxed hardcopy for Tramadol to [facility] [address] [place] 

## 2014-09-24 ENCOUNTER — Encounter: Payer: Self-pay | Admitting: Cardiovascular Disease

## 2014-09-24 ENCOUNTER — Ambulatory Visit (INDEPENDENT_AMBULATORY_CARE_PROVIDER_SITE_OTHER): Payer: Medicare Other | Admitting: Cardiovascular Disease

## 2014-09-24 VITALS — BP 130/68 | HR 72 | Ht 66.0 in | Wt 164.0 lb

## 2014-09-24 DIAGNOSIS — E7800 Pure hypercholesterolemia, unspecified: Secondary | ICD-10-CM

## 2014-09-24 DIAGNOSIS — Q211 Atrial septal defect: Secondary | ICD-10-CM

## 2014-09-24 DIAGNOSIS — Q2111 Secundum atrial septal defect: Secondary | ICD-10-CM

## 2014-09-24 DIAGNOSIS — I2510 Atherosclerotic heart disease of native coronary artery without angina pectoris: Secondary | ICD-10-CM

## 2014-09-24 DIAGNOSIS — I10 Essential (primary) hypertension: Secondary | ICD-10-CM

## 2014-09-24 DIAGNOSIS — I4891 Unspecified atrial fibrillation: Secondary | ICD-10-CM

## 2014-09-24 NOTE — Progress Notes (Signed)
Background: The patient is followed for a history of remote surgical repair of an atrial septal defect. In 2011 she presented with a type A aortic dissection and underwent emergent surgical repair. She was treated with multivessel CABG at that time as well. The patient has also been followed for atrial fibrillation, severe tricuspid regurgitation, and chronic congestive heart failure. She has been unable to tolerate warfarin because of severe recurrent epistaxis requiring ENT surgeries. She's had another episode of epistaxis off warfarin requiring packing and further treatment at [facility].  HPI:  79 year-old woman presenting for follow-up evaluation.she continues to do well from a cardiac perspective. She denies any recurrence of heart palpitations. She's had no leg swelling, orthopnea, or PND. She does note generalized progressive weakness and limitation related to osteoarthritis. She's having increasing problems with her left shoulder. She asks today about whether she may be a candidate for a shoulder replacement. She has been seen by Dr. Onnie Graham in the past.  Studies:  2D Echo 09/2013: Study Conclusions  - Left ventricle: The cavity size was normal. Wall thickness was increased in a pattern of moderate LVH. Postoperative septal hypokinesis. Systolic function was normal. The estimated ejection fraction was in the range of 60% to 65%. Doppler parameters are consistent with abnormal left ventricular relaxation (grade2 diastolic dysfunction). The E/A ratio is >1.5. The E/e' ratio is >20, suggesting markedly elevated LV filling pressure. - Aortic valve: Transvalvular velocity was minimally increased. There was no stenosis. Mildregurgitation. - Aorta: Aortic root dimension: 39m (ED). - Aortic root: The aortic root is mildly dilated. - Mitral valve: Calcified annulus. The posterior leaflet is partially tethered. Moderate, posteriorly direct regurgitation that hugs the free  wall. - Left atrium: Severely dilated (60 ml/m2). - Right ventricle: RV systolic pressure: 585YIHg (S, est). - Right atrium: The atrium was mildly dilated. - Atrial septum: Thickened consistent with prior ASD repair. - Tricuspid valve: Moderate regurgitation. - Pulmonic valve: Mild regurgitation. - Inferior vena cava: The vessel was normal in size; the respirophasic diameter changes were in the normal range (= 50%); findings are consistent with normal central venous pressure. - Pericardium, extracardiac: There was no pericardial effusion.  Outpatient Encounter Prescriptions as of 09/24/2014  Medication Sig  . acetaminophen (TYLENOL) 325 MG tablet Take 650 mg by mouth every 6 (six) hours as needed for mild pain.  .Marland Kitchenaspirin EC 81 MG tablet Take 1 tablet (81 mg total) by mouth daily.  . cholecalciferol (VITAMIN D) 1000 UNITS tablet Take 1,000 Units by mouth daily.   . clorazepate (TRANXENE) 7.5 MG tablet Take 1 tablet (7.5 mg total) by mouth at bedtime as needed.  . docusate sodium (COLACE) 50 MG capsule Take 50 mg by mouth 2 (two) times daily.  .Marland Kitchendofetilide (TIKOSYN) 250 MCG capsule Take 250 mcg by mouth 2 (two) times daily.  .Marland Kitchengabapentin (NEURONTIN) 300 MG capsule Take 600 mg by mouth at bedtime.  .Marland Kitchenglucosamine-chondroitin 500-400 MG tablet Take 1 tablet by mouth 2 (two) times daily.  .Marland KitchenHYDROcodone-acetaminophen (NORCO/VICODIN) 5-325 MG per tablet Take 1 tablet by mouth every 6 (six) hours as needed for moderate pain.  . metoprolol tartrate (LOPRESSOR) 25 MG tablet Take 12.5 mg by mouth 2 (two) times daily.  . Multiple Vitamins-Minerals (EYE VITAMINS PO) Take 1 tablet by mouth 2 (two) times daily.   .Marland Kitchenomeprazole (PRILOSEC) 20 MG capsule TAKE 1 CAPSULE BY MOUTH TWICE A DAY 30 MINUTES BEFORE MEALS  . Polyethylene Glycol 3350 (MIRALAX PO) Take 1 packet  by mouth at bedtime.   . potassium chloride SA (K-DUR,KLOR-CON) 20 MEQ tablet Take 20 mEq by mouth daily.  Marland Kitchen senna-docusate  (SENOKOT-S) 8.6-50 MG per tablet Take by mouth.  . sertraline (ZOLOFT) 50 MG tablet Take 1 tablet (50 mg total) by mouth daily.  Marland Kitchen spironolactone (ALDACTONE) 25 MG tablet Take 1 tablet (25 mg total) by mouth daily.  Marland Kitchen torsemide (DEMADEX) 20 MG tablet TAKE 3 TABLETS BY MOUTH DAILY AS DIRECTED  . traMADol (ULTRAM) 50 MG tablet Take 1 tablet (50 mg total) by mouth every 8 (eight) hours as needed for moderate pain.  . vitamin C (ASCORBIC ACID) 500 MG tablet Take 500 mg by mouth daily.  . [DISCONTINUED] amoxicillin (AMOXIL) 500 MG capsule TAKE 4 CAPSULES BY MOUTH 1 HOUR PRIOR TO DENTAL PROCEDURES.  . [DISCONTINUED] amoxicillin-clavulanate (AUGMENTIN) 875-125 MG per tablet Take 1 tablet by mouth 2 (two) times daily.  . [DISCONTINUED] doxycycline (VIBRA-TABS) 100 MG tablet   . [DISCONTINUED] MOVIPREP 100 G SOLR Take 1 kit (200 g total) by mouth once.  . [DISCONTINUED] omeprazole (PRILOSEC) 20 MG capsule Take 20 mg by mouth 2 (two) times daily before a meal.  . [DISCONTINUED] torsemide (DEMADEX) 20 MG tablet Take 60 mg by mouth daily.    Allergies  Allergen Reactions  . Amiodarone     Severe side effects per Pt--  . Crestor [Rosuvastatin] Other (See Comments)    Muscle weakness  . Erythromycin Other (See Comments)    unknown  . Meperidine Hcl Other (See Comments)    REACTION: dizziness  . Ropinirole Hydrochloride     REACTION: INTOL to Requip w/ sleep paralysis  . Atorvastatin Rash    REACTION: muscle pain  . Codeine Rash    REACTION: itching  . Erythromycin Base Rash  . Ezetimibe Rash    REACTION: INTOL to Zetia w/ cough  . Meperidine Rash  . Morphine Other (See Comments) and Rash    Pt states it "makes her crazy"  . Neomycin-Bacitracin Zn-Polymyx Other (See Comments) and Rash    blisters  . Ropinirole Rash  . Simvastatin Rash    REACTION: unable to walk--muscle pain    Past Medical History  Diagnosis Date  . OSA (obstructive sleep apnea)   . HTN (hypertension)   . Atrial  fibrillation     paroxysmal; on coumadin  . CAD (coronary artery disease)     a. cath 7/11: LM 40%, mild plaque disease in CFX, LAD, and RCA;  b. 2011 CABG with S-LAD and S-OM done at time of Aortic dissection repair  . Chronic diastolic CHF (congestive heart failure)     a. 08/2011 Echo: EF 55-60%, PASP 8mHg  . Dissection of aorta, thoracic     a. Type A; s/p repair 7/11 with aortic root repair and CABG x 2   . ASD (atrial septal defect)     [facility]     a. 2/2 TR and RV dysfxn;  b. echo 4/12: EF 60%, mild LVH, mild AI, mild MR, mod LAE, mod RVE with mod dec. RVSF, mod RAE, mod to severe TR, PASP 58;  c. right heart cath 4/12:  RA mean 8, RV 46/1 with mean 6, PA 45/13 with mean 26, PCWP mean 14, CO 3.68, CI 2.1 (no sig pulmon HTN)  . GERD (gastroesophageal reflux disease)   . IBS (irritable bowel syndrome)   . Esophageal stricture   . Colonic polyp   .  Peripheral neuropathy   . Back pain   . Fibromyalgia   . Anxiety   . HLD (hyperlipidemia)   . Borderline diabetes   . History of TIAs   . DJD (degenerative joint disease)   . Normocytic anemia   . Thrombocytopenia   . Macular degeneration   . Depression   . Esophageal dysmotilities     family history includes ALS in her mother; Colon cancer in her sister; Diabetes in her mother; Heart attack in her brother; Heart disease in her brother; Hypertension in her mother; Lung cancer in her brother; Melanoma in her sister; Osteoporosis in her sister; Pancreatic cancer in her sister.   ROS: Negative except as per HPI  BP 130/68 mmHg  Pulse 72  Ht 5' 6"  (1.676 m)  Wt 164 lb (74.39 kg)  BMI 26.48 kg/m2  PHYSICAL EXAM: Pt is alert and oriented, NAD HEENT: normal Neck: JVP - normal, carotids 2+= without bruits Lungs: CTA bilaterally CV: RRR with grade 3/6 systolic murmur at the left lower sternal border Abd: soft, NT, Positive BS, no hepatomegaly Ext: no C/C/E, distal pulses intact and  equal Skin: warm/dry no rash  EKG:  NSR 72 bpm, nonspecific ST abnormality  ASSESSMENT AND PLAN: 1. Paroxysmal atrial fibrillation: maintaining sinus rhythm onTikosyn. This drug has changed her life as she had a great deal of difficulty with AF and heart failure prior to this. Off anticoagulation because of recurrent bleeding events.  2. Chronic diastolic and right-sided heart failure. Exacerbated by severe TR. Overall stable with NYHA Class 2 CHF. Continue current management and check a metabolic panel since she is treated with aldactone and torsemide. Repeat 2D Echo in 6 months.  3. CAD - no sx's of angina.  4. Aortic dissection, Type A. Serial imaging follow-up as directed by Dr Roxy Manns.  Sherren Mocha, MD 09/24/2014 3:15 PM

## 2014-09-24 NOTE — Patient Instructions (Signed)
Your physician recommends that you have lab work today: [facility]   Your physician has requested that you have an echocardiogram in 6 MONTHS. Echocardiography is a painless test that uses sound waves to create images of your heart. It provides your doctor with information about the size and shape of your heart and how well your heart's chambers and valves are working. This procedure takes approximately one hour. There are no restrictions for this procedure.  Your physician recommends that you return for lab work in: 6 MONTHS (BMP)  Your physician wants you to follow-up in: 6 MONTHS with Dr Burt Knack.  You will receive a reminder letter in the mail two months in advance. If you don't receive a letter, please call our office to schedule the follow-up appointment.  Your physician recommends that you continue on your current medications as directed. Please refer to the Current Medication list given to you today.

## 2014-09-25 ENCOUNTER — Telehealth: Payer: Self-pay | Admitting: *Deleted

## 2014-09-25 LAB — BASIC METABOLIC PANEL
BUN: 30 mg/dL — ABNORMAL HIGH (ref 6–23)
CO2: 33 meq/L — AB (ref 19–32)
CREATININE: 1 mg/dL (ref 0.4–1.2)
Calcium: 9.4 mg/dL (ref 8.4–10.5)
Chloride: 99 mEq/L (ref 96–112)
GFR: 57.14 mL/min — ABNORMAL LOW (ref >60.00)
Glucose, Bld: 117 mg/dL — ABNORMAL HIGH (ref 70–99)
Potassium: 3.9 mEq/L (ref 3.5–5.1)
Sodium: 140 mEq/L (ref 135–145)

## 2014-09-25 NOTE — Telephone Encounter (Signed)
Dr Olevia Perches,  Reviewing this pt's chart this am I noticed she was on miralax Q bedtime, colace BID and senokot daily. Should she do a two day prep prior to her colon? Please advise.  Thanks,  Lelan Pons PV

## 2014-09-27 NOTE — Telephone Encounter (Signed)
Please add her on  For today, last appointm, she may have to wait. I will be the hospital doctor next week so I could not see her. If she cannot come, let her call me at home tonight as late as 11.00 pm.

## 2014-09-27 NOTE — Telephone Encounter (Signed)
Per Dr. Olevia Perches, patient should have 2 day prep.

## 2014-09-27 NOTE — Telephone Encounter (Signed)
Left a message for patient to call back. 

## 2014-09-30 LAB — HM DIABETES EYE EXAM

## 2014-10-01 ENCOUNTER — Encounter: Payer: Self-pay | Admitting: Internal Medicine

## 2014-10-04 ENCOUNTER — Other Ambulatory Visit (HOSPITAL_COMMUNITY): Payer: Medicare Other

## 2014-10-19 ENCOUNTER — Other Ambulatory Visit: Payer: Self-pay | Admitting: Pulmonary Disease

## 2014-10-20 ENCOUNTER — Other Ambulatory Visit: Payer: Self-pay | Admitting: Internal Medicine

## 2014-10-23 ENCOUNTER — Encounter: Payer: Medicare Other | Admitting: Internal Medicine

## 2014-10-30 ENCOUNTER — Encounter: Payer: Medicare Other | Admitting: Internal Medicine

## 2014-11-04 ENCOUNTER — Ambulatory Visit (INDEPENDENT_AMBULATORY_CARE_PROVIDER_SITE_OTHER): Payer: Medicare Other | Admitting: Family

## 2014-11-04 ENCOUNTER — Encounter: Payer: Self-pay | Admitting: Family

## 2014-11-04 ENCOUNTER — Other Ambulatory Visit: Payer: Self-pay | Admitting: Pulmonary Disease

## 2014-11-04 ENCOUNTER — Other Ambulatory Visit: Payer: Self-pay | Admitting: Internal Medicine

## 2014-11-04 VITALS — BP 142/68 | Temp 97.7°F | Resp 18 | Ht 66.0 in | Wt 161.4 lb

## 2014-11-04 DIAGNOSIS — I2510 Atherosclerotic heart disease of native coronary artery without angina pectoris: Secondary | ICD-10-CM

## 2014-11-04 DIAGNOSIS — J019 Acute sinusitis, unspecified: Secondary | ICD-10-CM

## 2014-11-04 MED ORDER — CEFUROXIME AXETIL 250 MG PO TABS: 250.0000 mg | ORAL_TABLET | Freq: Two times a day (BID) | ORAL | Status: DC

## 2014-11-04 NOTE — Patient Instructions (Signed)
Thank you for choosing [facility].  Summary/Instructions:  Your prescription(s) have been submitted to your pharmacy. Please take as directed and contact our office if you believe you are having problem(s) with the medication(s).  If your symptoms worsen or fail to improve, please contact our office for further instruction, or in case of emergency go directly to the emergency room at the closest medical facility.   Sinusitis Sinusitis is redness, soreness, and inflammation of the paranasal sinuses. Paranasal sinuses are air pockets within the bones of your face (beneath the eyes, the middle of the forehead, or above the eyes). In healthy paranasal sinuses, mucus is able to drain out, and air is able to circulate through them by way of your nose. However, when your paranasal sinuses are inflamed, mucus and air can become trapped. This can allow bacteria and other germs to grow and cause infection. Sinusitis can develop quickly and last only a short time (acute) or continue over a long period (chronic). Sinusitis that lasts for more than 12 weeks is considered chronic.  CAUSES  Causes of sinusitis include:  Allergies.  Structural abnormalities, such as displacement of the cartilage that separates your nostrils (deviated septum), which can decrease the air flow through your nose and sinuses and affect sinus drainage.  Functional abnormalities, such as when the small hairs (cilia) that line your sinuses and help remove mucus do not work properly or are not present. SIGNS AND SYMPTOMS  Symptoms of acute and chronic sinusitis are the same. The primary symptoms are pain and pressure around the affected sinuses. Other symptoms include:  Upper toothache.  Earache.  Headache.  Bad breath.  Decreased sense of smell and taste.  A cough, which worsens when you are lying flat.  Fatigue.  Fever.  Thick drainage from your nose, which often is green and may contain pus  (purulent).  Swelling and warmth over the affected sinuses. DIAGNOSIS  Your health care provider will perform a physical exam. During the exam, your health care provider may:  Look in your nose for signs of abnormal growths in your nostrils (nasal polyps).  Tap over the affected sinus to check for signs of infection.  View the inside of your sinuses (endoscopy) using an imaging device that has a light attached (endoscope). If your health care provider suspects that you have chronic sinusitis, one or more of the following tests may be recommended:  Allergy tests.  Nasal culture. A sample of mucus is taken from your nose, sent to a lab, and screened for bacteria.  Nasal cytology. A sample of mucus is taken from your nose and examined by your health care provider to determine if your sinusitis is related to an allergy. TREATMENT  Most cases of acute sinusitis are related to a viral infection and will resolve on their own within 10 days. Sometimes medicines are prescribed to help relieve symptoms (pain medicine, decongestants, nasal steroid sprays, or saline sprays).  However, for sinusitis related to a bacterial infection, your health care provider will prescribe antibiotic medicines. These are medicines that will help kill the bacteria causing the infection.  Rarely, sinusitis is caused by a fungal infection. In theses cases, your health care provider will prescribe antifungal medicine. For some cases of chronic sinusitis, surgery is needed. Generally, these are cases in which sinusitis recurs more than 3 times per year, despite other treatments. HOME CARE INSTRUCTIONS   Drink plenty of water. Water helps thin the mucus so your sinuses can drain more easily.  Use a humidifier.  Inhale steam 3 to 4 times a day (for example, sit in the bathroom with the shower running).  Apply a warm, moist washcloth to your face 3 to 4 times a day, or as directed by your health care provider.  Use  saline nasal sprays to help moisten and clean your sinuses.  Take medicines only as directed by your health care provider.  If you were prescribed either an antibiotic or antifungal medicine, finish it all even if you start to feel better. SEEK IMMEDIATE MEDICAL CARE IF:  You have increasing pain or severe headaches.  You have nausea, vomiting, or drowsiness.  You have swelling around your face.  You have vision problems.  You have a stiff neck.  You have difficulty breathing. MAKE SURE YOU:   Understand these instructions.  Will watch your condition.  Will get help right away if you are not doing well or get worse. Document Released: 11/01/2005 Document Revised: 03/18/2014 Document Reviewed: 11/16/2011 [facility] Patient Information 2015 [place], [place]. This information is not intended to replace advice given to you by your health care provider. Make sure you discuss any questions you have with your health care provider.

## 2014-11-04 NOTE — Assessment & Plan Note (Signed)
Symptoms and exam consistent with potential sinus infection cannot rule out bronchitis. Start Ceftin. Continue over-the-counter medication as needed for supportive care and symptom relief. Follow up if symptoms worsen or fail to improve.

## 2014-11-04 NOTE — Assessment & Plan Note (Signed)
Her symptoms and exam consistent with sinusitis cannot rule out bronchitis at this time. Start Ceftin 250 mg twice a day 10 days. Continue over-the-counter medications as needed for symptom relief. Follow up if symptoms worsen or fail to improve.

## 2014-11-04 NOTE — Progress Notes (Signed)
Subjective:    Patient ID: [name], female    DOB: [birth year], 79 y.o.   MRN: [id number]  Chief Complaint  Patient presents with  . Nasal Congestion    Has congestions, hoarsness, sinus pressure, x1 week    HPI:  [name] is a 79 y.o. female who presents today for an acute visit.  Acute symptoms of congestion, hoarseness, and sinus pressure going on for approximately one week.  Indicates that she feels like some of the congestion is in her chest. Over the course of the week that the feelings have gotten progressively worse. Denies fevers. Does have the occasional cough. Has tried several OTC medications including saline nasal sprays.   Allergies  Allergen Reactions  . Amiodarone     Severe side effects per Pt--  . Crestor [Rosuvastatin] Other (See Comments)    Muscle weakness  . Erythromycin Other (See Comments)    unknown  . Meperidine Hcl Other (See Comments)    REACTION: dizziness  . Ropinirole Hydrochloride     REACTION: INTOL to Requip w/ sleep paralysis  . Atorvastatin Rash    REACTION: muscle pain  . Codeine Rash    REACTION: itching  . Erythromycin Base Rash  . Ezetimibe Rash    REACTION: INTOL to Zetia w/ cough  . Meperidine Rash  . Morphine Other (See Comments) and Rash    Pt states it "makes her crazy"  . Neomycin-Bacitracin Zn-Polymyx Other (See Comments) and Rash    blisters  . Ropinirole Rash  . Simvastatin Rash    REACTION: unable to walk--muscle pain    Current Outpatient Prescriptions on File Prior to Visit  Medication Sig Dispense Refill  . acetaminophen (TYLENOL) 325 MG tablet Take 650 mg by mouth every 6 (six) hours as needed for mild pain.    Marland Kitchen aspirin EC 81 MG tablet Take 1 tablet (81 mg total) by mouth daily. 1 tablet 0  . cholecalciferol (VITAMIN D) 1000 UNITS tablet Take 1,000 Units by mouth daily.     . clorazepate (TRANXENE) 7.5 MG tablet Take 1 tablet (7.5 mg total) by mouth at bedtime as needed. 90 tablet 1  .  docusate sodium (COLACE) 50 MG capsule Take 50 mg by mouth 2 (two) times daily.    Marland Kitchen gabapentin (NEURONTIN) 300 MG capsule Take 600 mg by mouth at bedtime.    Marland Kitchen glucosamine-chondroitin 500-400 MG tablet Take 1 tablet by mouth 2 (two) times daily.    Marland Kitchen HYDROcodone-acetaminophen (NORCO/VICODIN) 5-325 MG per tablet Take 1 tablet by mouth every 6 (six) hours as needed for moderate pain.    . metoprolol tartrate (LOPRESSOR) 25 MG tablet Take 12.5 mg by mouth 2 (two) times daily.    . Multiple Vitamins-Minerals (EYE VITAMINS PO) Take 1 tablet by mouth 2 (two) times daily.     Marland Kitchen omeprazole (PRILOSEC) 20 MG capsule TAKE 1 CAPSULE BY MOUTH TWICE A DAY 30 MINUTES BEFORE MEALS 60 capsule 6  . Polyethylene Glycol 3350 (MIRALAX PO) Take 1 packet by mouth at bedtime.     . potassium chloride SA (K-DUR,KLOR-CON) 20 MEQ tablet Take 20 mEq by mouth daily.    Marland Kitchen senna-docusate (SENOKOT-S) 8.6-50 MG per tablet Take by mouth.    . sertraline (ZOLOFT) 50 MG tablet Take 1 tablet (50 mg total) by mouth daily. 90 tablet 3  . spironolactone (ALDACTONE) 25 MG tablet Take 1 tablet (25 mg total) by mouth daily. 30 tablet 6  . TIKOSYN 250 MCG capsule  TAKE ONE CAPSULE BY MOUTH EVERY 12 HOURS 180 capsule 0  . torsemide (DEMADEX) 20 MG tablet TAKE 3 TABLETS BY MOUTH DAILY AS DIRECTED 90 tablet 5  . traMADol (ULTRAM) 50 MG tablet Take 1 tablet (50 mg total) by mouth every 8 (eight) hours as needed for moderate pain. 30 tablet 1  . vitamin C (ASCORBIC ACID) 500 MG tablet Take 500 mg by mouth daily.     No current facility-administered medications on file prior to visit.    Review of Systems    See HPI   Objective:    BP 142/68 mmHg  Temp(Src) 97.7 F (36.5 C) (Oral)  Resp 18  Ht 5\' 6"  (1.676 m)  Wt 161 lb 6.4 oz (73.211 kg)  BMI 26.06 kg/m2 Nursing note and vital signs reviewed.  Physical Exam  Constitutional: She is oriented to person, place, and time. She appears well-developed and well-nourished. No distress.    HENT:  Right Ear: Hearing, tympanic membrane, external ear and ear canal normal.  Left Ear: Hearing, tympanic membrane, external ear and ear canal normal.  Nose: Right sinus exhibits maxillary sinus tenderness. Right sinus exhibits no frontal sinus tenderness. Left sinus exhibits maxillary sinus tenderness. Left sinus exhibits no frontal sinus tenderness.  Mouth/Throat: Uvula is midline, oropharynx is clear and moist and mucous membranes are normal.  Cardiovascular: Normal rate, regular rhythm, normal heart sounds and intact distal pulses.   Pulmonary/Chest: Effort normal and breath sounds normal.  Lymphadenopathy:    She has cervical adenopathy.  Neurological: She is alert and oriented to person, place, and time.  Skin: Skin is warm and dry.  Psychiatric: She has a normal mood and affect. Her behavior is normal. Judgment and thought content normal.       Assessment & Plan:

## 2014-11-04 NOTE — Progress Notes (Signed)
Pre visit review using our clinic review tool, if applicable. No additional management support is needed unless otherwise documented below in the visit note. 

## 2014-11-11 ENCOUNTER — Other Ambulatory Visit: Payer: Self-pay | Admitting: Pulmonary Disease

## 2014-11-11 ENCOUNTER — Other Ambulatory Visit: Payer: Self-pay | Admitting: Internal Medicine

## 2014-11-12 ENCOUNTER — Telehealth: Payer: Self-pay | Admitting: Internal Medicine

## 2014-11-12 ENCOUNTER — Other Ambulatory Visit: Payer: Self-pay | Admitting: Internal Medicine

## 2014-11-12 NOTE — Telephone Encounter (Signed)
Pt called in, requesting refill on:  Gabapentin  Spironolactone

## 2014-11-13 MED ORDER — SPIRONOLACTONE 25 MG PO TABS: 25.0000 mg | ORAL_TABLET | Freq: Every day | ORAL | Status: DC

## 2014-11-13 NOTE — Telephone Encounter (Signed)
Notified pt meds has been sent to [facility]...[facility]

## 2014-11-20 ENCOUNTER — Other Ambulatory Visit (INDEPENDENT_AMBULATORY_CARE_PROVIDER_SITE_OTHER): Payer: Medicare Other

## 2014-11-20 ENCOUNTER — Ambulatory Visit (AMBULATORY_SURGERY_CENTER): Payer: Self-pay | Admitting: *Deleted

## 2014-11-20 ENCOUNTER — Telehealth: Payer: Self-pay | Admitting: *Deleted

## 2014-11-20 ENCOUNTER — Encounter: Payer: Self-pay | Admitting: Internal Medicine

## 2014-11-20 ENCOUNTER — Ambulatory Visit (INDEPENDENT_AMBULATORY_CARE_PROVIDER_SITE_OTHER): Payer: Medicare Other | Admitting: Internal Medicine

## 2014-11-20 VITALS — Ht 66.0 in | Wt 157.0 lb

## 2014-11-20 VITALS — BP 118/82 | HR 69 | Temp 98.1°F | Ht 66.0 in | Wt 157.1 lb

## 2014-11-20 DIAGNOSIS — Z86010 Personal history of colonic polyps: Secondary | ICD-10-CM

## 2014-11-20 DIAGNOSIS — R634 Abnormal weight loss: Secondary | ICD-10-CM | POA: Insufficient documentation

## 2014-11-20 DIAGNOSIS — R7302 Impaired glucose tolerance (oral): Secondary | ICD-10-CM

## 2014-11-20 DIAGNOSIS — R1314 Dysphagia, pharyngoesophageal phase: Secondary | ICD-10-CM

## 2014-11-20 DIAGNOSIS — D649 Anemia, unspecified: Secondary | ICD-10-CM

## 2014-11-20 LAB — CBC WITH DIFFERENTIAL/PLATELET
BASOS ABS: 0 10*3/uL (ref 0.0–0.1)
Basophils Relative: 0.5 % (ref 0.0–3.0)
Eosinophils Absolute: 0 10*3/uL (ref 0.0–0.7)
Eosinophils Relative: 0.6 % (ref 0.0–5.0)
HCT: 36.2 % (ref 36.0–46.0)
Hemoglobin: 11.6 g/dL — ABNORMAL LOW (ref 12.0–15.0)
Lymphocytes Relative: 15.4 % (ref 12.0–46.0)
Lymphs Abs: 1.1 10*3/uL (ref 0.7–4.0)
MCHC: 32 g/dL (ref 30.0–36.0)
MCV: 89.4 fl (ref 78.0–100.0)
MONO ABS: 0.4 10*3/uL (ref 0.1–1.0)
Monocytes Relative: 6 % (ref 3.0–12.0)
Neutro Abs: 5.7 10*3/uL (ref 1.4–7.7)
Neutrophils Relative %: 77.5 % — ABNORMAL HIGH (ref 43.0–77.0)
PLATELETS: 249 10*3/uL (ref 150.0–400.0)
RBC: 4.04 Mil/uL (ref 3.87–5.11)
RDW: 15.1 % (ref 11.5–15.5)
WBC: 7.3 10*3/uL (ref 4.0–10.5)

## 2014-11-20 LAB — HEPATIC FUNCTION PANEL
ALBUMIN: 4.5 g/dL (ref 3.5–5.2)
ALK PHOS: 70 U/L (ref 39–117)
ALT: 9 U/L (ref 0–35)
AST: 17 U/L (ref 0–37)
Bilirubin, Direct: 0.1 mg/dL (ref 0.0–0.3)
TOTAL PROTEIN: 8.3 g/dL (ref 6.0–8.3)
Total Bilirubin: 0.8 mg/dL (ref 0.2–1.2)

## 2014-11-20 LAB — TSH: TSH: 2.16 u[IU]/mL (ref 0.35–4.50)

## 2014-11-20 LAB — BASIC METABOLIC PANEL
BUN: 21 mg/dL (ref 6–23)
CHLORIDE: 99 meq/L (ref 96–112)
CO2: 32 meq/L (ref 19–32)
Calcium: 9.4 mg/dL (ref 8.4–10.5)
Creatinine, Ser: 0.9 mg/dL (ref 0.4–1.2)
GFR: 67.08 mL/min (ref >60.00)
GLUCOSE: 112 mg/dL — AB (ref 70–99)
POTASSIUM: 3.4 meq/L — AB (ref 3.5–5.1)
SODIUM: 139 meq/L (ref 135–145)

## 2014-11-20 LAB — IBC PANEL
IRON: 39 ug/dL — AB (ref 42–145)
Saturation Ratios: 10.4 % — ABNORMAL LOW (ref 20.0–50.0)
Transferrin: 268.8 mg/dL (ref 212.0–360.0)

## 2014-11-20 LAB — HEMOGLOBIN A1C: Hgb A1c MFr Bld: 6.4 % (ref 4.6–6.5)

## 2014-11-20 NOTE — Assessment & Plan Note (Signed)
New finding recent per Dr Burt Knack labs, for f/u cbc, iron/b12,  Lab Results  Component Value Date   WBC 4.6 05/02/2014   HGB 10.7* 05/02/2014   HCT 32.5* 05/02/2014   MCV 92.6 05/02/2014   PLT 181.0 05/02/2014

## 2014-11-20 NOTE — Progress Notes (Signed)
Subjective:    Patient ID: [name], female    DOB: 1935/07/26, 80 y.o.   MRN: [id number]  HPI  Here to f/u; overall doing ok,  Pt denies chest pain, increased sob or doe, wheezing, orthopnea, PND, increased LE swelling, palpitations, dizziness or syncope.  Pt denies polydipsia, polyuria, or low sugar symptoms such as weakness or confusion improved with po intake.  Pt denies new neurological symptoms such as new headache, or facial or extremity weakness or numbness.   Pt states overall good compliance with meds, has been trying to follow lower cholesterol diet, with wt overall stable,  but little exercise however. Does c/o recurring nausea intermittent, good compliance with PPI.Marland Kitchen No vomiting.  Not nauseas this am, but had only small bfast due to lack of appetite.  Sister with pancreatic ca, with only symptoms of nausea per pt Wt Readings from Last 3 Encounters:  11/20/14 157 lb 2 oz (71.271 kg)  11/04/14 161 lb 6.4 oz (73.211 kg)  09/24/14 164 lb (74.39 kg)   Not clear why lost wt. No abd pain, vomiting.  Tends to try to be very active, [place] 3/4 mile daily.  Denies worsening depressive symptoms, suicidal ideation, or panic.   S/p nasal surgury x 2 for recurrent bleeding, last time at [facility], only one episode nasal bleed since then on Nov 15, 2014 - used cotton balls with afrin with change every 30 min, then to [facility] since did not stop; tx with a spray but seemed to stop, packing not needed, also tx with a local topical coagulant, no further bleeding.  Does have some hoarseness since dec 21. Has appt later this month with ENT.  Also sees Dr Gloris Manchester for chronic LBP, injections have helped  Also has planned EGD for later this mo for EGD with Dr Olevia Perches Past Medical History  Diagnosis Date  . OSA (obstructive sleep apnea)   . HTN (hypertension)   . Atrial fibrillation     paroxysmal; on coumadin  . CAD (coronary artery disease)     a. cath 7/11: LM 40%, mild plaque disease in  CFX, LAD, and RCA;  b. 2011 CABG with S-LAD and S-OM done at time of Aortic dissection repair  . Chronic diastolic CHF (congestive heart failure)     a. 08/2011 Echo: EF 55-60%, PASP 67mmHg  . Dissection of aorta, thoracic     a. Type A; s/p repair 7/11 with aortic root repair and CABG x 2   . ASD (atrial septal defect)     a. s/p repair 1982 at [facility]  . Right heart failure     a. 2/2 TR and RV dysfxn;  b. echo 4/12: EF 60%, mild LVH, mild AI, mild MR, mod LAE, mod RVE with mod dec. RVSF, mod RAE, mod to severe TR, PASP 58;  c. right heart cath 4/12:  RA mean 8, RV 46/1 with mean 6, PA 45/13 with mean 26, PCWP mean 14, CO 3.68, CI 2.1 (no sig pulmon HTN)  . GERD (gastroesophageal reflux disease)   . IBS (irritable bowel syndrome)   . Esophageal stricture   . Colonic polyp   . Peripheral neuropathy   . Back pain   . Fibromyalgia   . Anxiety   . HLD (hyperlipidemia)   . Borderline diabetes   . History of TIAs   . DJD (degenerative joint disease)   . Normocytic anemia   . Thrombocytopenia   . Macular degeneration   . Depression   .  Esophageal dysmotilities    Past Surgical History  Procedure Laterality Date  . Asd repair  1982    [facility]  . Rotator cuff repair  2007    right  . Cardioversion      x 3  . Tubal ligation    . Appendectomy    . Vaginal hysterectomy  1987    A/P Repair With Cystocele and rectocele repair  . Tonsillectomy    . Oophorectomy  1994    BSO  . Pelvic laparoscopy  1994  . Emergency redo median sternotomy for hemiarch repair of acute type a aortic  dissection  06/05/2010  . Nasal hemorrhage control  12/27/2011    Procedure: EPISTAXIS CONTROL;  Surgeon: Ruby Cola, MD;  Location: [facility] OR;  Service: ENT;  Laterality: N/A;    reports that she has never smoked. She has never used smokeless tobacco. She reports that she does not drink alcohol or use illicit drugs. family history includes ALS in her mother; Colon cancer in her sister; Diabetes in her mother;  Heart attack in her brother; Heart disease in her brother; Hypertension in her mother; Lung cancer in her brother; Melanoma in her sister; Osteoporosis in her sister; Pancreatic cancer in her sister. Allergies  Allergen Reactions  . Amiodarone     Severe side effects per Pt--  . Crestor [Rosuvastatin] Other (See Comments)    Muscle weakness  . Erythromycin Other (See Comments)    unknown  . Meperidine Hcl Other (See Comments)    REACTION: dizziness  . Ropinirole Hydrochloride     REACTION: INTOL to Requip w/ sleep paralysis  . Atorvastatin Rash    REACTION: muscle pain  . Codeine Rash    REACTION: itching  . Erythromycin Base Rash  . Ezetimibe Rash    REACTION: INTOL to Zetia w/ cough  . Meperidine Rash  . Morphine Other (See Comments) and Rash    Pt states it "makes her crazy"  . Neomycin-Bacitracin Zn-Polymyx Other (See Comments) and Rash    blisters  . Ropinirole Rash  . Simvastatin Rash    REACTION: unable to walk--muscle pain   Current Outpatient Prescriptions on File Prior to Visit  Medication Sig Dispense Refill  . acetaminophen (TYLENOL) 325 MG tablet Take 650 mg by mouth every 6 (six) hours as needed for mild pain.    Marland Kitchen aspirin EC 81 MG tablet Take 1 tablet (81 mg total) by mouth daily. 1 tablet 0  . cholecalciferol (VITAMIN D) 1000 UNITS tablet Take 1,000 Units by mouth daily.     . clorazepate (TRANXENE) 7.5 MG tablet Take 1 tablet (7.5 mg total) by mouth at bedtime as needed. 90 tablet 1  . docusate sodium (COLACE) 50 MG capsule Take 50 mg by mouth 2 (two) times daily.    Marland Kitchen gabapentin (NEURONTIN) 300 MG capsule TAKE 2 CAPSULES BY MOUTH AT BEDTIME 180 capsule 3  . glucosamine-chondroitin 500-400 MG tablet Take 1 tablet by mouth 2 (two) times daily.    Marland Kitchen HYDROcodone-acetaminophen (NORCO/VICODIN) 5-325 MG per tablet Take 1 tablet by mouth every 6 (six) hours as needed for moderate pain.    . metoprolol tartrate (LOPRESSOR) 25 MG tablet Take 12.5 mg by mouth 2 (two)  times daily.    . metoprolol tartrate (LOPRESSOR) 25 MG tablet TAKE 0.5 TABLETS (12.5 MG TOTAL) BY MOUTH 2 (TWO) TIMES DAILY. 90 tablet 3  . Multiple Vitamins-Minerals (EYE VITAMINS PO) Take 1 tablet by mouth 2 (two) times daily.     Marland Kitchen  omeprazole (PRILOSEC) 20 MG capsule TAKE 1 CAPSULE BY MOUTH TWICE A DAY 30 MINUTES BEFORE MEALS 60 capsule 6  . Polyethylene Glycol 3350 (MIRALAX PO) Take 1 packet by mouth at bedtime.     . potassium chloride SA (K-DUR,KLOR-CON) 20 MEQ tablet Take 20 mEq by mouth daily.    Marland Kitchen senna-docusate (SENOKOT-S) 8.6-50 MG per tablet Take by mouth.    . sertraline (ZOLOFT) 50 MG tablet Take 1 tablet (50 mg total) by mouth daily. 90 tablet 3  . spironolactone (ALDACTONE) 25 MG tablet Take 1 tablet (25 mg total) by mouth daily. 90 tablet 3  . TIKOSYN 250 MCG capsule TAKE ONE CAPSULE BY MOUTH EVERY 12 HOURS 180 capsule 0  . torsemide (DEMADEX) 20 MG tablet TAKE 3 TABLETS BY MOUTH DAILY AS DIRECTED 90 tablet 5  . traMADol (ULTRAM) 50 MG tablet Take 1 tablet (50 mg total) by mouth every 8 (eight) hours as needed for moderate pain. 30 tablet 1  . vitamin C (ASCORBIC ACID) 500 MG tablet Take 500 mg by mouth daily.     No current facility-administered medications on file prior to visit.    Review of Systems  Constitutional: Negative for unusual diaphoresis or other sweats  HENT: Negative for ringing in ear Eyes: Negative for double vision or worsening visual disturbance.  Respiratory: Negative for choking and stridor.   Gastrointestinal: Negative for vomiting or other signifcant bowel change - takes miralax daily and senakot Genitourinary: Negative for hematuria or decreased urine volume.  Musculoskeletal: Negative for other MSK pain or swelling Skin: Negative for color change and worsening wound.  Neurological: Negative for tremors and numbness other than noted  Psychiatric/Behavioral: Negative for decreased concentration or agitation other than above       Objective:    Physical Exam BP 118/82 mmHg  Pulse 69  Temp(Src) 98.1 F (36.7 C) (Oral)  Ht 5\' 6"  (1.676 m)  Wt 157 lb 2 oz (71.271 kg)  BMI 25.37 kg/m2  SpO2 99% VS noted,  Constitutional: Pt appears well-developed, well-nourished.  HENT: Head: NCAT.  Right Ear: External ear normal.  Left Ear: External ear normal.  Eyes: . Pupils are equal, round, and reactive to light. Conjunctivae and EOM are normal Neck: Normal range of motion. Neck supple.  Cardiovascular: Normal rate and regular rhythm.   Pulmonary/Chest: Effort normal and breath sounds without rales or wheezing.  Abd:  Soft, NT, ND, + BS Neurological: Pt is alert. Not confused , motor grossly intact Skin: Skin is warm. No rash Psychiatric: Pt behavior is normal. No agitation.     Assessment & Plan:

## 2014-11-20 NOTE — Progress Notes (Signed)
No egg or soy allergy  No anesthesia or intubation problems per pt  No diet medications taken  Pt has a very complicated medical hx- note sent to Osvaldo Angst to review chart to make sure pt appropriate for [place]  Instructions for double prep given; pt states she already has moviprep at home so no RX given

## 2014-11-20 NOTE — Telephone Encounter (Signed)
[name], Would you please review her chart for [place]?  Thanks, [employer]

## 2014-11-20 NOTE — Assessment & Plan Note (Signed)
etiology unclear, pt attributes to less appeittie and nausea, for EGD later this mo, also for SPEP today

## 2014-11-20 NOTE — Patient Instructions (Signed)

## 2014-11-20 NOTE — Progress Notes (Signed)
Pre visit review using our clinic review tool, if applicable. No additional management support is needed unless otherwise documented below in the visit note. 

## 2014-11-20 NOTE — Assessment & Plan Note (Signed)
Asympt, for a1c todya

## 2014-11-21 ENCOUNTER — Encounter: Payer: Self-pay | Admitting: Internal Medicine

## 2014-11-21 ENCOUNTER — Other Ambulatory Visit: Payer: Self-pay | Admitting: Internal Medicine

## 2014-11-21 DIAGNOSIS — D509 Iron deficiency anemia, unspecified: Secondary | ICD-10-CM | POA: Insufficient documentation

## 2014-11-21 HISTORY — DX: Iron deficiency anemia, unspecified: D50.9

## 2014-11-22 LAB — PROTEIN ELECTROPHORESIS, SERUM
Albumin ELP: 55.8 % (ref 55.8–66.1)
Alpha-1-Globulin: 4.6 % (ref 2.9–4.9)
Alpha-2-Globulin: 11 % (ref 7.1–11.8)
BETA 2: 6.7 % — AB (ref 3.2–6.5)
Beta Globulin: 5.8 % (ref 4.7–7.2)
Gamma Globulin: 16.1 % (ref 11.1–18.8)
TOTAL PROTEIN, SERUM ELECTROPHOR: 8.1 g/dL (ref 6.0–8.3)

## 2014-11-22 NOTE — Telephone Encounter (Signed)
Noted  

## 2014-11-22 NOTE — Telephone Encounter (Signed)
[name],  This pt is cleared for care at [facility]  Thanks,  Jenny Reichmann

## 2014-11-28 ENCOUNTER — Other Ambulatory Visit: Payer: Self-pay | Admitting: Pulmonary Disease

## 2014-11-29 ENCOUNTER — Emergency Department (HOSPITAL_COMMUNITY): Payer: Medicare Other

## 2014-11-29 ENCOUNTER — Observation Stay (HOSPITAL_COMMUNITY)
Admission: EM | Admit: 2014-11-29 | Discharge: 2014-12-01 | Disposition: A | Discharge status: Home / Self Care | Payer: Medicare Other | Attending: Internal Medicine | Admitting: Internal Medicine

## 2014-11-29 ENCOUNTER — Other Ambulatory Visit: Payer: Self-pay | Admitting: Pulmonary Disease

## 2014-11-29 ENCOUNTER — Other Ambulatory Visit: Payer: Self-pay | Admitting: Internal Medicine

## 2014-11-29 ENCOUNTER — Encounter (HOSPITAL_COMMUNITY): Payer: Self-pay | Admitting: Emergency Medicine

## 2014-11-29 DIAGNOSIS — I10 Essential (primary) hypertension: Secondary | ICD-10-CM | POA: Diagnosis not present

## 2014-11-29 DIAGNOSIS — F419 Anxiety disorder, unspecified: Secondary | ICD-10-CM | POA: Diagnosis not present

## 2014-11-29 DIAGNOSIS — G629 Polyneuropathy, unspecified: Secondary | ICD-10-CM | POA: Diagnosis not present

## 2014-11-29 DIAGNOSIS — Z808 Family history of malignant neoplasm of other organs or systems: Secondary | ICD-10-CM | POA: Diagnosis not present

## 2014-11-29 DIAGNOSIS — G4730 Sleep apnea, unspecified: Secondary | ICD-10-CM | POA: Diagnosis not present

## 2014-11-29 DIAGNOSIS — Z888 Allergy status to other drugs, medicaments and biological substances status: Secondary | ICD-10-CM | POA: Diagnosis not present

## 2014-11-29 DIAGNOSIS — G4733 Obstructive sleep apnea (adult) (pediatric): Secondary | ICD-10-CM | POA: Diagnosis not present

## 2014-11-29 DIAGNOSIS — Z881 Allergy status to other antibiotic agents status: Secondary | ICD-10-CM | POA: Diagnosis not present

## 2014-11-29 DIAGNOSIS — Z8673 Personal history of transient ischemic attack (TIA), and cerebral infarction without residual deficits: Secondary | ICD-10-CM | POA: Insufficient documentation

## 2014-11-29 DIAGNOSIS — E7800 Pure hypercholesterolemia, unspecified: Secondary | ICD-10-CM | POA: Diagnosis present

## 2014-11-29 DIAGNOSIS — E785 Hyperlipidemia, unspecified: Secondary | ICD-10-CM | POA: Insufficient documentation

## 2014-11-29 DIAGNOSIS — R55 Syncope and collapse: Secondary | ICD-10-CM | POA: Diagnosis present

## 2014-11-29 DIAGNOSIS — Z951 Presence of aortocoronary bypass graft: Secondary | ICD-10-CM | POA: Diagnosis not present

## 2014-11-29 DIAGNOSIS — I480 Paroxysmal atrial fibrillation: Secondary | ICD-10-CM | POA: Diagnosis present

## 2014-11-29 DIAGNOSIS — I5032 Chronic diastolic (congestive) heart failure: Secondary | ICD-10-CM | POA: Diagnosis not present

## 2014-11-29 DIAGNOSIS — M797 Fibromyalgia: Secondary | ICD-10-CM | POA: Insufficient documentation

## 2014-11-29 DIAGNOSIS — M1990 Unspecified osteoarthritis, unspecified site: Secondary | ICD-10-CM | POA: Diagnosis not present

## 2014-11-29 DIAGNOSIS — H3530 Unspecified macular degeneration: Secondary | ICD-10-CM | POA: Diagnosis not present

## 2014-11-29 DIAGNOSIS — Z885 Allergy status to narcotic agent status: Secondary | ICD-10-CM | POA: Insufficient documentation

## 2014-11-29 DIAGNOSIS — I71019 Dissection of thoracic aorta, unspecified: Secondary | ICD-10-CM | POA: Diagnosis present

## 2014-11-29 DIAGNOSIS — K219 Gastro-esophageal reflux disease without esophagitis: Secondary | ICD-10-CM | POA: Insufficient documentation

## 2014-11-29 DIAGNOSIS — F329 Major depressive disorder, single episode, unspecified: Secondary | ICD-10-CM | POA: Insufficient documentation

## 2014-11-29 DIAGNOSIS — Z801 Family history of malignant neoplasm of trachea, bronchus and lung: Secondary | ICD-10-CM | POA: Insufficient documentation

## 2014-11-29 DIAGNOSIS — I7101 Dissection of thoracic aorta: Secondary | ICD-10-CM | POA: Insufficient documentation

## 2014-11-29 DIAGNOSIS — I2510 Atherosclerotic heart disease of native coronary artery without angina pectoris: Secondary | ICD-10-CM | POA: Diagnosis not present

## 2014-11-29 DIAGNOSIS — S0181XA Laceration without foreign body of other part of head, initial encounter: Secondary | ICD-10-CM

## 2014-11-29 DIAGNOSIS — W19XXXA Unspecified fall, initial encounter: Secondary | ICD-10-CM

## 2014-11-29 DIAGNOSIS — Z800 Family history of malignant neoplasm of digestive organs: Secondary | ICD-10-CM | POA: Diagnosis not present

## 2014-11-29 DIAGNOSIS — Z7982 Long term (current) use of aspirin: Secondary | ICD-10-CM | POA: Diagnosis not present

## 2014-11-29 HISTORY — DX: Unspecified osteoarthritis, unspecified site: M19.90

## 2014-11-29 LAB — CBC WITH DIFFERENTIAL/PLATELET
BASOS ABS: 0 10*3/uL (ref 0.0–0.1)
BASOS PCT: 0 % (ref 0–1)
Eosinophils Absolute: 0 10*3/uL (ref 0.0–0.7)
Eosinophils Relative: 1 % (ref 0–5)
HCT: 34.9 % — ABNORMAL LOW (ref 36.0–46.0)
Hemoglobin: 10.8 g/dL — ABNORMAL LOW (ref 12.0–15.0)
Lymphocytes Relative: 19 % (ref 12–46)
Lymphs Abs: 1 10*3/uL (ref 0.7–4.0)
MCH: 28.6 pg (ref 26.0–34.0)
MCHC: 30.9 g/dL (ref 30.0–36.0)
MCV: 92.3 fL (ref 78.0–100.0)
MONO ABS: 0.4 10*3/uL (ref 0.1–1.0)
Monocytes Relative: 7 % (ref 3–12)
NEUTROS PCT: 73 % (ref 43–77)
Neutro Abs: 3.8 10*3/uL (ref 1.7–7.7)
Platelets: 177 10*3/uL (ref 150–400)
RBC: 3.78 MIL/uL — AB (ref 3.87–5.11)
RDW: 14.2 % (ref 11.5–15.5)
WBC: 5.2 10*3/uL (ref 4.0–10.5)

## 2014-11-29 LAB — BASIC METABOLIC PANEL
ANION GAP: 14 (ref 5–15)
BUN: 21 mg/dL (ref 6–23)
CALCIUM: 9.7 mg/dL (ref 8.4–10.5)
CHLORIDE: 98 meq/L (ref 96–112)
CO2: 30 mmol/L (ref 19–32)
CREATININE: 0.85 mg/dL (ref 0.50–1.10)
GFR calc Af Amer: 75 mL/min — ABNORMAL LOW (ref >90)
GFR calc non Af Amer: 64 mL/min — ABNORMAL LOW (ref >90)
Glucose, Bld: 124 mg/dL — ABNORMAL HIGH (ref 70–99)
Potassium: 3.9 mmol/L (ref 3.5–5.1)
Sodium: 142 mmol/L (ref 135–145)

## 2014-11-29 LAB — URINALYSIS, ROUTINE W REFLEX MICROSCOPIC
BILIRUBIN URINE: NEGATIVE
Glucose, UA: NEGATIVE mg/dL
HGB URINE DIPSTICK: NEGATIVE
KETONES UR: NEGATIVE mg/dL
LEUKOCYTES UA: NEGATIVE
NITRITE: NEGATIVE
Protein, ur: NEGATIVE mg/dL
SPECIFIC GRAVITY, URINE: 1.006 (ref 1.005–1.030)
UROBILINOGEN UA: 0.2 mg/dL (ref 0.0–1.0)
pH: 6.5 (ref 5.0–8.0)

## 2014-11-29 LAB — I-STAT TROPONIN, ED: TROPONIN I, POC: 0 ng/mL (ref 0.00–0.08)

## 2014-11-29 LAB — TROPONIN I: Troponin I: <0.03 ng/mL (ref <0.031)

## 2014-11-29 MED ORDER — DOFETILIDE 250 MCG PO CAPS
250.0000 ug | ORAL_CAPSULE | Freq: Two times a day (BID) | ORAL | Status: DC
  Administered 2014-11-29 – 2014-12-01 (×4): 250 ug via ORAL
  Filled 2014-11-29 (×6): qty 1

## 2014-11-29 MED ORDER — PANTOPRAZOLE SODIUM 40 MG PO TBEC
40.0000 mg | DELAYED_RELEASE_TABLET | Freq: Every day | ORAL | Status: DC
  Administered 2014-11-30 – 2014-12-01 (×2): 40 mg via ORAL
  Filled 2014-11-29 (×2): qty 1

## 2014-11-29 MED ORDER — GABAPENTIN 300 MG PO CAPS
600.0000 mg | ORAL_CAPSULE | Freq: Every day | ORAL | Status: DC
  Administered 2014-11-29 – 2014-11-30 (×2): 600 mg via ORAL
  Filled 2014-11-29 (×3): qty 2

## 2014-11-29 MED ORDER — HYDROCODONE-ACETAMINOPHEN 5-325 MG PO TABS
1.0000 | ORAL_TABLET | Freq: Four times a day (QID) | ORAL | Status: DC | PRN
  Administered 2014-12-01: 1 via ORAL
  Filled 2014-11-29: qty 1

## 2014-11-29 MED ORDER — SODIUM CHLORIDE 0.9 % IJ SOLN
3.0000 mL | Freq: Two times a day (BID) | INTRAMUSCULAR | Status: DC
  Administered 2014-11-29 – 2014-12-01 (×4): 3 mL via INTRAVENOUS

## 2014-11-29 MED ORDER — METOPROLOL TARTRATE 12.5 MG HALF TABLET
12.5000 mg | ORAL_TABLET | Freq: Two times a day (BID) | ORAL | Status: DC
  Administered 2014-11-29 – 2014-12-01 (×4): 12.5 mg via ORAL
  Filled 2014-11-29 (×5): qty 1

## 2014-11-29 MED ORDER — ACETAMINOPHEN 500 MG PO TABS
1000.0000 mg | ORAL_TABLET | Freq: Four times a day (QID) | ORAL | Status: DC | PRN
  Administered 2014-11-29 – 2014-11-30 (×5): 1000 mg via ORAL
  Filled 2014-11-29 (×6): qty 2

## 2014-11-29 MED ORDER — SERTRALINE HCL 50 MG PO TABS
50.0000 mg | ORAL_TABLET | Freq: Every day | ORAL | Status: DC
  Administered 2014-11-30 – 2014-12-01 (×2): 50 mg via ORAL
  Filled 2014-11-29 (×3): qty 1

## 2014-11-29 MED ORDER — SODIUM CHLORIDE 0.9 % IV SOLN: INTRAVENOUS | Status: DC

## 2014-11-29 MED ORDER — ASPIRIN EC 81 MG PO TBEC
81.0000 mg | DELAYED_RELEASE_TABLET | Freq: Every day | ORAL | Status: DC
  Administered 2014-11-30 – 2014-12-01 (×2): 81 mg via ORAL
  Filled 2014-11-29 (×2): qty 1

## 2014-11-29 MED ORDER — CLORAZEPATE DIPOTASSIUM 3.75 MG PO TABS
7.5000 mg | ORAL_TABLET | Freq: Every evening | ORAL | Status: DC | PRN
  Administered 2014-11-29 – 2014-12-01 (×2): 7.5 mg via ORAL
  Filled 2014-11-29 (×2): qty 2

## 2014-11-29 MED ORDER — LIDOCAINE-EPINEPHRINE (PF) 2 %-1:200000 IJ SOLN
10.0000 mL | Freq: Once | INTRAMUSCULAR | Status: AC
  Administered 2014-11-29: 10 mL
  Filled 2014-11-29: qty 20

## 2014-11-29 MED ORDER — TRAMADOL HCL 50 MG PO TABS
50.0000 mg | ORAL_TABLET | Freq: Three times a day (TID) | ORAL | Status: DC | PRN
  Administered 2014-11-30: 50 mg via ORAL
  Filled 2014-11-29: qty 1

## 2014-11-29 MED ORDER — SENNOSIDES-DOCUSATE SODIUM 8.6-50 MG PO TABS
1.0000 | ORAL_TABLET | Freq: Every evening | ORAL | Status: DC | PRN
  Filled 2014-11-29: qty 1

## 2014-11-29 MED ORDER — POLYETHYLENE GLYCOL 3350 17 G PO PACK
17.0000 g | PACK | Freq: Every day | ORAL | Status: DC | PRN
  Administered 2014-11-30: 17 g via ORAL
  Filled 2014-11-29 (×2): qty 1

## 2014-11-29 NOTE — ED Notes (Signed)
MD Pickering at the bedside 

## 2014-11-29 NOTE — ED Notes (Signed)
MD Made aware of patient's current stated. Stated they would go in to see the patient shortly.

## 2014-11-29 NOTE — ED Notes (Signed)
Admitting MD at the bedside.  

## 2014-11-29 NOTE — ED Provider Notes (Signed)
CSN: [id number]     Arrival date & time 11/29/14  1402 History   First MD Initiated Contact with Patient 11/29/14 1432     Chief Complaint  Patient presents with  . Fall  . Head Laceration     (Consider location/radiation/quality/duration/timing/severity/associated sxs/prior Treatment) Patient is a 80 y.o. female presenting with fall and scalp laceration. The history is provided by the patient.  Fall This is a new problem. Pertinent negatives include no chest pain, no abdominal pain, no headaches and no shortness of breath.  Head Laceration Pertinent negatives include no chest pain, no abdominal pain, no headaches and no shortness of breath.   patient complaining of syncope and fall. States she was standing there and She will she was laying on the ground. She struck her head. She was appropriate after she fell. Laceration to left forehead area. No chest pain. She states she feels much better now. Has a history of coronary artery disease and previous aortic dissection. Previously on anticoagulation but not currently. No abdominal pain. No fevers. No dysuria. No confusion. She's been eating well last few days. No previous episodes of syncope like this. Patient states she walks a mile every morning and walked a mile this morning without any more difficulty than normal.  Past Medical History  Diagnosis Date  . OSA (obstructive sleep apnea)     pt denies this  . HTN (hypertension)   . Atrial fibrillation     paroxysmal; on coumadin  . CAD (coronary artery disease)     a. cath 7/11: LM 40%, mild plaque disease in CFX, LAD, and RCA;  b. 2011 CABG with S-LAD and S-OM done at time of Aortic dissection repair  . Chronic diastolic CHF (congestive heart failure)     a. 08/2011 Echo: EF 55-60%, PASP 70mmHg  . Dissection of aorta, thoracic     a. Type A; s/p repair 7/11 with aortic root repair and CABG x 2   . ASD (atrial septal defect)     a. s/p repair [facility] at [facility]  . Right heart failure      a. 2/2 TR and RV dysfxn;  b. echo 4/12: EF 60%, mild LVH, mild AI, mild MR, mod LAE, mod RVE with mod dec. RVSF, mod RAE, mod to severe TR, PASP 58;  c. right heart cath 4/12:  RA mean 8, RV 46/1 with mean 6, PA 45/13 with mean 26, PCWP mean 14, CO 3.68, CI 2.1 (no sig pulmon HTN)  . GERD (gastroesophageal reflux disease)   . IBS (irritable bowel syndrome)   . Esophageal stricture   . Colonic polyp   . Peripheral neuropathy   . Back pain   . Fibromyalgia   . Anxiety   . HLD (hyperlipidemia)   . Borderline diabetes   . History of TIAs   . DJD (degenerative joint disease)   . Normocytic anemia   . Thrombocytopenia   . Macular degeneration   . Depression   . Esophageal dysmotilities   . Blood transfusion without reported diagnosis   . Cancer     squamous cell CA  . Sleep apnea     pt denies OSA  . Anemia, iron deficiency 11/21/2014   Past Surgical History  Procedure Laterality Date  . Asd repair  1982    [facility]  . Rotator cuff repair  2007    right  . Cardioversion      x 3  . Tubal ligation    . Appendectomy    .  Vaginal hysterectomy  1987    A/P Repair With Cystocele and rectocele repair   . Tonsillectomy    . Oophorectomy  1994    BSO  . Pelvic laparoscopy  1994  . Emergency redo median sternotomy for hemiarch repair of acute type a aortic  dissection  06/05/2010  . Nasal hemorrhage control  12/27/2011    Procedure: EPISTAXIS CONTROL;  Surgeon: Ruby Cola, MD;  Location: [facility] OR;  Service: ENT;  Laterality: N/A;  . Cataract extraction      both eyes   Family History  Problem Relation Age of Onset  . Pancreatic cancer Sister   . Osteoporosis Sister   . Lung cancer Brother     x 2  . Heart attack Brother   . Melanoma Sister   . Diabetes Mother   . Hypertension Mother   . ALS Mother   . Colon polyps Sister   . Heart disease Brother     x 2  . Esophageal cancer Neg Hx   . Colon cancer Neg Hx   . Rectal cancer Neg Hx   . Stomach cancer Neg Hx    History   Substance Use Topics  . Smoking status: Never Smoker   . Smokeless tobacco: Never Used  . Alcohol Use: No   OB History    Gravida Para Term Preterm AB TAB SAB Ectopic Multiple Living   4 3 3  1     3      Review of Systems  Constitutional: Negative for activity change and appetite change.  Eyes: Negative for pain.  Respiratory: Negative for chest tightness and shortness of breath.   Cardiovascular: Negative for chest pain and leg swelling.  Gastrointestinal: Negative for nausea, vomiting, abdominal pain and diarrhea.  Genitourinary: Negative for flank pain.  Musculoskeletal: Negative for back pain and neck stiffness.  Skin: Positive for wound. Negative for rash.  Neurological: Positive for syncope. Negative for weakness, numbness and headaches.  Psychiatric/Behavioral: Negative for behavioral problems.      Allergies  Amiodarone; Crestor; Erythromycin; Meperidine hcl; Ropinirole hydrochloride; Atorvastatin; Codeine; Erythromycin base; Ezetimibe; Meperidine; Morphine; Neomycin-bacitracin zn-polymyx; and Simvastatin  Home Medications   Prior to Admission medications   Medication Sig Start Date End Date Taking? Authorizing Provider  acetaminophen (TYLENOL) 325 MG tablet Take 650 mg by mouth every 6 (six) hours as needed for mild pain.    Historical Provider, MD  aspirin EC 81 MG tablet Take 1 tablet (81 mg total) by mouth daily. 05/11/13   Blane Ohara, MD  cholecalciferol (VITAMIN D) 1000 UNITS tablet Take 1,000 Units by mouth daily.     Historical Provider, MD  clorazepate (TRANXENE) 7.5 MG tablet Take 1 tablet (7.5 mg total) by mouth at bedtime as needed. 06/27/14   Biagio Borg, MD  docusate sodium (COLACE) 50 MG capsule Take 50 mg by mouth 2 (two) times daily.    Historical Provider, MD  gabapentin (NEURONTIN) 300 MG capsule TAKE 2 CAPSULES BY MOUTH AT BEDTIME 11/12/14   Biagio Borg, MD  glucosamine-chondroitin 500-400 MG tablet Take 1 tablet by mouth 2 (two) times daily.     Historical Provider, MD  HYDROcodone-acetaminophen (NORCO/VICODIN) 5-325 MG per tablet Take 1 tablet by mouth every 6 (six) hours as needed for moderate pain.    Historical Provider, MD  metoprolol tartrate (LOPRESSOR) 25 MG tablet Take 12.5 mg by mouth 2 (two) times daily.    Historical Provider, MD  Multiple Vitamins-Minerals (EYE VITAMINS PO) Take 1 tablet  by mouth 2 (two) times daily.     Historical Provider, MD  omeprazole (PRILOSEC) 20 MG capsule TAKE 1 CAPSULE BY MOUTH TWICE A DAY 30 MINUTES BEFORE MEALS 09/18/14   Biagio Borg, MD  Polyethylene Glycol 3350 (MIRALAX PO) Take 1 packet by mouth at bedtime.     Historical Provider, MD  potassium chloride SA (K-DUR,KLOR-CON) 20 MEQ tablet Take 20 mEq by mouth daily.    Historical Provider, MD  senna-docusate (SENOKOT-S) 8.6-50 MG per tablet Take by mouth.    Historical Provider, MD  sertraline (ZOLOFT) 50 MG tablet Take 1 tablet (50 mg total) by mouth daily. 07/24/14   Biagio Borg, MD  spironolactone (ALDACTONE) 25 MG tablet Take 1 tablet (25 mg total) by mouth daily. 11/13/14   Biagio Borg, MD  TIKOSYN 250 MCG capsule TAKE ONE CAPSULE BY MOUTH EVERY 12 HOURS 10/21/14   Biagio Borg, MD  torsemide (DEMADEX) 20 MG tablet TAKE 3 TABLETS BY MOUTH DAILY AS DIRECTED 11/29/14   Biagio Borg, MD  traMADol (ULTRAM) 50 MG tablet Take 1 tablet (50 mg total) by mouth every 8 (eight) hours as needed for moderate pain. 09/18/14   Biagio Borg, MD  vitamin C (ASCORBIC ACID) 500 MG tablet Take 500 mg by mouth daily.    Historical Provider, MD   BP 122/62 mmHg  Pulse 72  Temp(Src) 98.2 F (36.8 C) (Oral)  Resp 18  SpO2 96% Physical Exam  Constitutional: She is oriented to person, place, and time. She appears well-developed and well-nourished.  HENT:  Head: Normocephalic.  1.5 cm laceration above left lateral orbital ridge. No step-off or deformity.  Eyes: EOM are normal. Pupils are equal, round, and reactive to light.  Neck: Normal range of motion. Neck  supple.  Cardiovascular: Normal rate and regular rhythm.   Murmur heard. Pulmonary/Chest: Effort normal and breath sounds normal. No respiratory distress. She has no wheezes. She has no rales.  Scar from previous sternotomies.  Abdominal: Soft. Bowel sounds are normal. She exhibits no distension. There is no tenderness. There is no rebound and no guarding.  Musculoskeletal: Normal range of motion.  Neurological: She is alert and oriented to person, place, and time. No cranial nerve deficit.  Skin: Skin is warm and dry.  Psychiatric: She has a normal mood and affect. Her speech is normal.  Nursing note and vitals reviewed.   ED Course  Procedures (including critical care time) Labs Review Labs Reviewed  CBC WITH DIFFERENTIAL  BASIC METABOLIC PANEL  URINALYSIS, ROUTINE W REFLEX MICROSCOPIC  I-STAT [place], ED    Imaging Review No results found.   EKG Interpretation   Date/Time:  Friday November 29 2014 14:57:04 EST Ventricular Rate:  71 PR Interval:  140 QRS Duration: 93 QT Interval:  432 QTC Calculation: 469 R Axis:   87 Text Interpretation:  Sinus rhythm Atrial premature complex Borderline  right axis deviation Left ventricular hypertrophy Confirmed by Alvino Chapel   MD, Ovid Curd [phone]) on 11/29/2014 3:23:47 PM      MDM   Final diagnoses:  Fall  Syncope, unspecified syncope type  Facial laceration, initial encounter    Patient with fall. Laceration that will be closed. Also had syncope that caused the fall. EKG reassuring. Does have some cardiac history and will be admitted for the syncopal episode.    Jasper Riling. Alvino Chapel, MD 11/29/14 1530

## 2014-11-29 NOTE — ED Notes (Signed)
Patient returned from X-ray 

## 2014-11-29 NOTE — ED Notes (Signed)
Attempted Report x1.   

## 2014-11-29 NOTE — ED Notes (Signed)
To ED [facility], had near syncopal episode, falling and hitting head on left upper forehead, left side of head-- c/o headache. Alert/ Oriented x 4, pt is a retired [employer] scientist,

## 2014-11-29 NOTE — ED Provider Notes (Signed)
1600 - Care from Dr. Alvino Chapel. 4F here s/p syncopal event. No preceding symptoms. Labs ok. Head CT ok. Admitted to telemetry.  1. Syncope, unspecified syncope type   2. Fall   3. Fall   4. Facial laceration, initial encounter      Evelina Bucy, MD 11/29/14 1645

## 2014-11-29 NOTE — ED Notes (Signed)
PA at the bedside stitching patient up.

## 2014-11-29 NOTE — ED Provider Notes (Signed)
LACERATION REPAIR Performed by: Faustino Congress Authorized by: Faustino Congress Consent: Verbal consent obtained. Risks and benefits: risks, benefits and alternatives were discussed Consent given by: patient Patient identity confirmed: provided demographic data Prepped and Draped in normal sterile fashion Wound explored  Laceration Location: superior to left eye  Laceration Length: 1cm  No Foreign Bodies seen or palpated  Anesthesia: local infiltration  Local anesthetic: lidocaine 2% with epinephrine  Anesthetic total: 3 ml  Irrigation method: skin scrub with dermal cleanser Amount of cleaning: standard  Skin closure: 5-0 Ethilon  Number of sutures: 3  Technique: simple interrupted  Patient tolerance: Patient tolerated the procedure well with no immediate complications.   Carlisle Cater, PA-C 11/29/14 1824  Evelina Bucy, MD 11/30/14 [phone]

## 2014-11-29 NOTE — H&P (Signed)
History and Physical    [name] VQQ:[id number] DOB: Feb 06, 1935 DOA: 11/29/2014  Referring physician: EDP PCP: Cathlean Cower, MD  Specialists: none  Chief Complaint: syncope  HPI: [name] is a 80 y.o. female has a past medical history significant for HTN, A fib, chronic diastolic CHF, history of aortic dissection s/p repair 2011, CAD s/p CABG, comes in with a syncopal episode, fall and a scalp laceration. She was feeling relatively well today, was running errands and while standing up she had a syncopal episode, fell and hit an adjacent wall with her forehead then fell on the floor hitting the back of her head. She denies loss of consciousness and remembers the episode. She denies any chest pain, shortness of breath, denies any abdominal pain, nausea or vomiting, she reports compliance with her medications. She denies any palpitations, lightheadedness or dizziness. Denies fevers or chills, dysuria or URI/flu like symptoms. This has never happened to her before. In the ED her vital signs are stable, EKG shows sinus rhythm, initial troponin is negative, renal function normal. TRH asked to admit for syncope observation.   Review of Systems: as per HPI otherwise 10 point ROS negative  Past Medical History  Diagnosis Date  . OSA (obstructive sleep apnea)     pt denies this  . HTN (hypertension)   . Atrial fibrillation     paroxysmal; on coumadin  . CAD (coronary artery disease)     a. cath 7/11: LM 40%, mild plaque disease in CFX, LAD, and RCA;  b. 2011 CABG with S-LAD and S-OM done at time of Aortic dissection repair  . Chronic diastolic CHF (congestive heart failure)     a. 08/2011 Echo: EF 55-60%, PASP 64mmHg  . Dissection of aorta, thoracic     a. Type A; s/p repair 7/11 with aortic root repair and CABG x 2   . ASD (atrial septal defect)     a. s/p repair 1982 at [facility]  . Right heart failure     a. 2/2 TR and RV dysfxn;  b. echo 4/12: EF 60%, mild LVH, mild AI, mild MR,  mod LAE, mod RVE with mod dec. RVSF, mod RAE, mod to severe TR, PASP 58;  c. right heart cath 4/12:  RA mean 8, RV 46/1 with mean 6, PA 45/13 with mean 26, PCWP mean 14, CO 3.68, CI 2.1 (no sig pulmon HTN)  . GERD (gastroesophageal reflux disease)   . IBS (irritable bowel syndrome)   . Esophageal stricture   . Colonic polyp   . Peripheral neuropathy   . Back pain   . Fibromyalgia   . Anxiety   . HLD (hyperlipidemia)   . Borderline diabetes   . History of TIAs   . DJD (degenerative joint disease)   . Normocytic anemia   . Thrombocytopenia   . Macular degeneration   . Depression   . Esophageal dysmotilities   . Blood transfusion without reported diagnosis   . Cancer     squamous cell CA  . Sleep apnea     pt denies OSA  . Anemia, iron deficiency 11/21/2014   Past Surgical History  Procedure Laterality Date  . Asd repair  1982    [facility]  . Rotator cuff repair  2007    right  . Cardioversion      x 3  . Tubal ligation    . Appendectomy    . Vaginal hysterectomy  1987    A/P Repair With Cystocele  and rectocele repair   . Tonsillectomy    . Oophorectomy  1994    BSO  . Pelvic laparoscopy  1994  . Emergency redo median sternotomy for hemiarch repair of acute type a aortic  dissection  06/05/2010  . Nasal hemorrhage control  12/27/2011    Procedure: EPISTAXIS CONTROL;  Surgeon: Ruby Cola, MD;  Location: [facility] OR;  Service: ENT;  Laterality: N/A;  . Cataract extraction      both eyes   Social History:  reports that she has never smoked. She has never used smokeless tobacco. She reports that she does not drink alcohol or use illicit drugs.  Allergies  Allergen Reactions  . Amiodarone     Severe side effects per Pt--  . Crestor [Rosuvastatin] Other (See Comments)    Muscle weakness  . Erythromycin Other (See Comments)    unknown  . Meperidine Hcl Other (See Comments)    REACTION: dizziness  . Ropinirole Hydrochloride     REACTION: INTOL to Requip w/ sleep paralysis  .  Atorvastatin Rash    REACTION: muscle pain  . Codeine Rash    REACTION: itching  . Erythromycin Base Rash  . Ezetimibe Rash    REACTION: INTOL to Zetia w/ cough  . Meperidine Rash  . Morphine Other (See Comments) and Rash    Pt states it "makes her crazy"  . Neomycin-Bacitracin Zn-Polymyx Other (See Comments) and Rash    blisters  . Simvastatin Rash    REACTION: unable to walk--muscle pain    Family History  Problem Relation Age of Onset  . Pancreatic cancer Sister   . Osteoporosis Sister   . Lung cancer Brother     x 2  . Heart attack Brother   . Melanoma Sister   . Diabetes Mother   . Hypertension Mother   . ALS Mother   . Colon polyps Sister   . Heart disease Brother     x 2  . Esophageal cancer Neg Hx   . Colon cancer Neg Hx   . Rectal cancer Neg Hx   . Stomach cancer Neg Hx     Prior to Admission medications   Medication Sig Start Date End Date Taking? Authorizing Provider  acetaminophen (TYLENOL) 325 MG tablet Take 650 mg by mouth every 6 (six) hours as needed for mild pain.   Yes Historical Provider, MD  aspirin EC 81 MG tablet Take 1 tablet (81 mg total) by mouth daily. 05/11/13  Yes Blane Ohara, MD  cholecalciferol (VITAMIN D) 1000 UNITS tablet Take 1,000 Units by mouth daily.    Yes Historical Provider, MD  clorazepate (TRANXENE) 7.5 MG tablet Take 1 tablet (7.5 mg total) by mouth at bedtime as needed. 06/27/14  Yes Biagio Borg, MD  docusate sodium (COLACE) 50 MG capsule Take 50 mg by mouth 2 (two) times daily.   Yes Historical Provider, MD  gabapentin (NEURONTIN) 300 MG capsule TAKE 2 CAPSULES BY MOUTH AT BEDTIME 11/12/14  Yes Biagio Borg, MD  glucosamine-chondroitin 500-400 MG tablet Take 1 tablet by mouth 2 (two) times daily.   Yes Historical Provider, MD  HYDROcodone-acetaminophen (NORCO/VICODIN) 5-325 MG per tablet Take 1 tablet by mouth every 6 (six) hours as needed for moderate pain.   Yes Historical Provider, MD  metoprolol tartrate (LOPRESSOR) 25  MG tablet Take 12.5 mg by mouth 2 (two) times daily.   Yes Historical Provider, MD  Multiple Vitamins-Minerals (EYE VITAMINS PO) Take 1 tablet by mouth 2 (two)  times daily.    Yes Historical Provider, MD  omeprazole (PRILOSEC) 20 MG capsule TAKE 1 CAPSULE BY MOUTH TWICE A DAY 30 MINUTES BEFORE MEALS 09/18/14  Yes Biagio Borg, MD  Polyethylene Glycol 3350 (MIRALAX PO) Take 1 packet by mouth at bedtime.    Yes Historical Provider, MD  potassium chloride SA (K-DUR,KLOR-CON) 20 MEQ tablet Take 20 mEq by mouth daily.   Yes Historical Provider, MD  senna-docusate (SENOKOT-S) 8.6-50 MG per tablet Take by mouth.   Yes Historical Provider, MD  sertraline (ZOLOFT) 50 MG tablet Take 1 tablet (50 mg total) by mouth daily. 07/24/14  Yes Biagio Borg, MD  spironolactone (ALDACTONE) 25 MG tablet Take 1 tablet (25 mg total) by mouth daily. 11/13/14  Yes Biagio Borg, MD  TIKOSYN 250 MCG capsule TAKE ONE CAPSULE BY MOUTH EVERY 12 HOURS 10/21/14  Yes Biagio Borg, MD  torsemide (DEMADEX) 20 MG tablet TAKE 3 TABLETS BY MOUTH DAILY AS DIRECTED 11/29/14  Yes Biagio Borg, MD  traMADol (ULTRAM) 50 MG tablet Take 1 tablet (50 mg total) by mouth every 8 (eight) hours as needed for moderate pain. 09/18/14  Yes Biagio Borg, MD  vitamin C (ASCORBIC ACID) 500 MG tablet Take 500 mg by mouth daily.   Yes Historical Provider, MD   Physical Exam: Filed Vitals:   11/29/14 1410 11/29/14 1500 11/29/14 1552 11/29/14 1700  BP:  122/62 120/88   Pulse: 79 72 71   Temp:    98.2 F (36.8 C)  TempSrc:      Resp: 12 18 15    SpO2: 97% 96% 99%     General:  No apparent distress  Eyes: no scleral icterus  ENT: moist oropharynx  Neck: supple,  Cardiovascular: regular rate 3/6 SEM, 2+ peripheral pulses, no JVD, trace peripheral edema  Respiratory: CTA biL, good air movement without wheezing  Abdomen: soft, non tender to palpation  Skin: no rashes  Musculoskeletal: normal bulk and tone, no joint swelling  Psychiatric: normal  mood and affect  Neurologic: non focal  Labs on Admission:  Basic Metabolic Panel:  Recent Labs Lab 11/29/14 1510  NA 142  K 3.9  CL 98  CO2 30  GLUCOSE 124*  BUN 21  CREATININE 0.85  CALCIUM 9.7   CBC:  Recent Labs Lab 11/29/14 1510  WBC 5.2  NEUTROABS 3.8  HGB 10.8*  HCT 34.9*  MCV 92.3  PLT 177   Radiological Exams on Admission: Dg Chest 2 View  11/29/2014   CLINICAL DATA:  [address] backwards while standing in line at store today. Hit head during fall.  EXAM: CHEST  2 VIEW  COMPARISON:  October 17, 2013.  FINDINGS: Stable cardiomegaly. Sternotomy wires are noted. No pneumothorax or pleural effusion is noted. No acute pulmonary disease is noted. 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  IMPRESSION: No acute cardiopulmonary abnormality seen.   Electronically Signed   By: Sabino Dick M.D.   On: 11/29/2014 15:47   Ct Head Wo Contrast  11/29/2014   CLINICAL DATA:  Initial encounter for syncope. Fall. Injury to left periorbital region.  EXAM: CT HEAD WITHOUT CONTRAST  TECHNIQUE: Contiguous axial images were obtained from the base of the skull through the vertex without intravenous contrast.  COMPARISON:  06/18/2010  FINDINGS: Sinuses/Soft tissues: Occipital scalp soft tissue swelling is moderate. Mucosal thickening involves both maxillary sinuses. Surgical changes within the paranasal sinuses. Mild mucosal thickening left sphenoid sinus and bilateral ethmoid air cells.  Right frontal sinus partial opacification. No skull fracture. Clear mastoid air cells.  Intracranial: Mild low density in the periventricular white matter likely related to small vessel disease. No mass lesion, hemorrhage, hydrocephalus, acute infarct, intra-axial, or extra-axial fluid collection.  IMPRESSION: Scalp soft tissue swelling, without acute intracranial abnormality.  Sinus disease.   Electronically Signed   By: Abigail Miyamoto M.D.   On: 11/29/2014 16:16    EKG: Independently  reviewed.  Assessment/Plan Principal Problem:   Syncope Active Problems:   HYPERCHOLESTEROLEMIA   Essential hypertension   Coronary atherosclerosis   Atrial fibrillation   Syncope - will admit patient to telemetry for observation - cycle cardiac enzymes overnight - gentle hydration - orthostatics in am  Chronic diastolic heart failure  - she is euvolemic, gentle hydration as above, hold diuretics overnight  A fib - currently in sinus, she was on anticoagulation which was discontinued due to epistaxix - continue Aspiring - continue Tikosyn  CAD - continue home medications, no chest pain  Diet: heart Fluids: NS at 50  DVT Prophylaxis: SCD  Code Status: Full Family Communication: d/w husband  Disposition Plan: admit to tele  Time spent: 58  Costin M. Cruzita Lederer, MD [facility] Pager [phone]  If 7PM-7AM, please contact night-coverage [email or web address] Password [facility] 11/29/2014, 5:17 PM

## 2014-11-30 ENCOUNTER — Encounter (HOSPITAL_COMMUNITY): Payer: Self-pay | Admitting: Cardiology

## 2014-11-30 DIAGNOSIS — E785 Hyperlipidemia, unspecified: Secondary | ICD-10-CM | POA: Diagnosis not present

## 2014-11-30 DIAGNOSIS — R55 Syncope and collapse: Secondary | ICD-10-CM | POA: Diagnosis not present

## 2014-11-30 DIAGNOSIS — I10 Essential (primary) hypertension: Secondary | ICD-10-CM | POA: Diagnosis not present

## 2014-11-30 DIAGNOSIS — I71019 Dissection of thoracic aorta, unspecified: Secondary | ICD-10-CM | POA: Diagnosis present

## 2014-11-30 DIAGNOSIS — I7101 Dissection of thoracic aorta: Secondary | ICD-10-CM | POA: Diagnosis present

## 2014-11-30 DIAGNOSIS — I5032 Chronic diastolic (congestive) heart failure: Secondary | ICD-10-CM | POA: Diagnosis not present

## 2014-11-30 DIAGNOSIS — I2510 Atherosclerotic heart disease of native coronary artery without angina pectoris: Secondary | ICD-10-CM | POA: Diagnosis present

## 2014-11-30 LAB — CBC WITH DIFFERENTIAL/PLATELET
BASOS ABS: 0 10*3/uL (ref 0.0–0.1)
BASOS PCT: 0 % (ref 0–1)
Eosinophils Absolute: 0.1 10*3/uL (ref 0.0–0.7)
Eosinophils Relative: 1 % (ref 0–5)
HEMATOCRIT: 34.2 % — AB (ref 36.0–46.0)
HEMOGLOBIN: 10.7 g/dL — AB (ref 12.0–15.0)
Lymphocytes Relative: 19 % (ref 12–46)
Lymphs Abs: 1 10*3/uL (ref 0.7–4.0)
MCH: 28.4 pg (ref 26.0–34.0)
MCHC: 31.3 g/dL (ref 30.0–36.0)
MCV: 90.7 fL (ref 78.0–100.0)
MONOS PCT: 8 % (ref 3–12)
Monocytes Absolute: 0.4 10*3/uL (ref 0.1–1.0)
NEUTROS ABS: 4 10*3/uL (ref 1.7–7.7)
Neutrophils Relative %: 72 % (ref 43–77)
PLATELETS: 186 10*3/uL (ref 150–400)
RBC: 3.77 MIL/uL — AB (ref 3.87–5.11)
RDW: 14.1 % (ref 11.5–15.5)
WBC: 5.5 10*3/uL (ref 4.0–10.5)

## 2014-11-30 LAB — TROPONIN I

## 2014-11-30 LAB — CBC
HCT: 32.1 % — ABNORMAL LOW (ref 36.0–46.0)
Hemoglobin: 9.9 g/dL — ABNORMAL LOW (ref 12.0–15.0)
MCH: 28.3 pg (ref 26.0–34.0)
MCHC: 30.8 g/dL (ref 30.0–36.0)
MCV: 91.7 fL (ref 78.0–100.0)
Platelets: 171 10*3/uL (ref 150–400)
RBC: 3.5 MIL/uL — ABNORMAL LOW (ref 3.87–5.11)
RDW: 14.2 % (ref 11.5–15.5)
WBC: 5.3 10*3/uL (ref 4.0–10.5)

## 2014-11-30 LAB — BASIC METABOLIC PANEL
ANION GAP: 9 (ref 5–15)
BUN: 23 mg/dL (ref 6–23)
CHLORIDE: 99 meq/L (ref 96–112)
CO2: 31 mmol/L (ref 19–32)
CREATININE: 0.89 mg/dL (ref 0.50–1.10)
Calcium: 9.2 mg/dL (ref 8.4–10.5)
GFR calc non Af Amer: 61 mL/min — ABNORMAL LOW (ref >90)
GFR, EST AFRICAN AMERICAN: 71 mL/min — AB (ref >90)
GLUCOSE: 96 mg/dL (ref 70–99)
POTASSIUM: 3.7 mmol/L (ref 3.5–5.1)
Sodium: 139 mmol/L (ref 135–145)

## 2014-11-30 LAB — MRSA PCR SCREENING: MRSA by PCR: POSITIVE — AB

## 2014-11-30 LAB — MAGNESIUM: Magnesium: 2.5 mg/dL (ref 1.5–2.5)

## 2014-11-30 MED ORDER — POTASSIUM CHLORIDE CRYS ER 20 MEQ PO TBCR
40.0000 meq | EXTENDED_RELEASE_TABLET | Freq: Once | ORAL | Status: AC
  Administered 2014-11-30: 40 meq via ORAL
  Filled 2014-11-30: qty 2

## 2014-11-30 NOTE — Progress Notes (Signed)
PATIENT DETAILS Name: [name] Age: 80 y.o. Sex: female Date of Birth: [birth year] Admit Date: 11/29/2014 Admitting Physician Costin Karlyne Greenspan, MD [id number] Jenny Reichmann, MD  Subjective: No major complaints.  Assessment/Plan: Principal Problem:   Syncope/presyncope: Significant cardiac history. Telemetry seems unremarkable. Have consulted cardiology for further recommendations.  Active Problems:   Atrial fibrillation: Remains in sinus rhythm. On Tikosyn. Not on anticoagulation given history of epistaxis. On aspirin    Essential hypertension: Controlled, continue with metoprolol.     Coronary atherosclerosis-status post CABG: Continue with aspirin, metoprolol. Without any chest pain or shortness of breath    History of type A aortic dissection -status post repair    Disposition: Remain inpatient-home after cardiology evaluation  Antibiotics:  None   Anti-infectives    None      DVT Prophylaxis: SCD's  Code Status: Full code   Family Communication None at bedsdie  Procedures:  None  CONSULTS:  cardiology  Time spent 40 minutes-which includes 50% of the time with face-to-face with patient/ family and coordinating care related to the above assessment and plan.  MEDICATIONS: Scheduled Meds: . aspirin EC  81 mg Oral Daily  . dofetilide  250 mcg Oral BID  . gabapentin  600 mg Oral QHS  . metoprolol tartrate  12.5 mg Oral BID  . pantoprazole  40 mg Oral Daily  . potassium chloride  40 mEq Oral Once  . sertraline  50 mg Oral Daily  . sodium chloride  3 mL Intravenous Q12H   Continuous Infusions: . sodium chloride     PRN Meds:.acetaminophen, clorazepate, HYDROcodone-acetaminophen, polyethylene glycol, senna-docusate, traMADol    PHYSICAL EXAM: Vital signs in last 24 hours: Filed Vitals:   11/29/14 1815 11/29/14 1850 11/29/14 1908 11/30/14 0530  BP:  104/59 118/78 126/69  Pulse: 70 69 75 64  Temp:   98.4 F (36.9 C) 97.3 F (36.3 C)    TempSrc:   Oral Oral  Resp: 14 19 18 20   Height:   5\' 6"  (1.676 m)   Weight:   70.761 kg (156 lb)   SpO2: 95% 94% 94% 93%    Weight change:  Filed Weights   11/29/14 1908  Weight: 70.761 kg (156 lb)   Body mass index is 25.19 kg/(m^2).   Gen Exam: Awake and alert with clear speech.   Neck: Supple, No JVD.   Chest: B/L Clear.   CVS: S1 S2 Regular, no murmurs.  Abdomen: soft, BS +, non tender, non distended.  Extremities: no edema, lower extremities warm to touch. Neurologic: Non Focal.   Skin: No Rash.   Wounds: N/A.    Intake/Output from previous day:  Intake/Output Summary (Last 24 hours) at 11/30/14 1334 Last data filed at 11/30/14 0835  Gross per 24 hour  Intake    200 ml  Output      0 ml  Net    200 ml     LAB RESULTS: CBC  Recent Labs Lab 11/29/14 1510 11/30/14 0125  WBC 5.2 5.3  HGB 10.8* 9.9*  HCT 34.9* 32.1*  PLT 177 171  MCV 92.3 91.7  MCH 28.6 28.3  MCHC 30.9 30.8  RDW 14.2 14.2  LYMPHSABS 1.0  --   MONOABS 0.4  --   EOSABS 0.0  --   BASOSABS 0.0  --     Chemistries   Recent Labs Lab 11/29/14 1510 11/30/14 0125  NA 142 139  K 3.9 3.7  CL 98 99  CO2 30 31  GLUCOSE 124* 96  BUN 21 23  CREATININE 0.85 0.89  CALCIUM 9.7 9.2    CBG: No results for input(s): GLUCAP in the last 168 hours.  GFR Estimated Creatinine Clearance: 49.6 mL/min (by C-G formula based on Cr of 0.89).  Coagulation profile No results for input(s): INR, PROTIME in the last 168 hours.  Cardiac Enzymes  Recent Labs Lab 11/29/14 1843 11/30/14 0125 11/30/14 0734  TROPONINI <0.03 <0.03 <0.03    Invalid input(s): POCBNP No results for input(s): DDIMER in the last 72 hours. No results for input(s): HGBA1C in the last 72 hours. No results for input(s): CHOL, HDL, LDLCALC, TRIG, CHOLHDL, LDLDIRECT in the last 72 hours. No results for input(s): TSH, T4TOTAL, T3FREE, THYROIDAB in the last 72 hours.  Invalid input(s): FREET3 No results for input(s):  VITAMINB12, FOLATE, FERRITIN, TIBC, IRON, RETICCTPCT in the last 72 hours. No results for input(s): LIPASE, AMYLASE in the last 72 hours.  Urine Studies No results for input(s): UHGB, CRYS in the last 72 hours.  Invalid input(s): UACOL, UAPR, USPG, UPH, UTP, UGL, UKET, UBIL, UNIT, UROB, ULEU, UEPI, UWBC, URBC, UBAC, CAST, UCOM, BILUA  MICROBIOLOGY: Recent Results (from the past 240 hour(s))  MRSA PCR Screening     Status: Abnormal   Collection Time: 11/30/14 12:32 AM  Result Value Ref Range Status   MRSA by PCR POSITIVE (A) NEGATIVE Final    Comment:        The GeneXpert MRSA Assay (FDA approved for NASAL specimens only), is one component of a comprehensive MRSA colonization surveillance program. It is not intended to diagnose MRSA infection nor to guide or monitor treatment for MRSA infections. RESULT CALLED TO, READ BACK BY AND VERIFIED WITH: Murvin Donning 01.16.16 0241 M [facility]     RADIOLOGY STUDIES/RESULTS: Dg Chest 2 View  11/29/2014   CLINICAL DATA:  [address] backwards while standing in line at store today. Hit head during fall.  EXAM: CHEST  2 VIEW  COMPARISON:  October 17, 2013.  FINDINGS: Stable cardiomegaly. Sternotomy wires are noted. No pneumothorax or pleural effusion is noted. No acute pulmonary disease is noted. 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  IMPRESSION: No acute cardiopulmonary abnormality seen.   Electronically Signed   By: Sabino Dick M.D.   On: 11/29/2014 15:47   Ct Head Wo Contrast  11/29/2014   CLINICAL DATA:  Initial encounter for syncope. Fall. Injury to left periorbital region.  EXAM: CT HEAD WITHOUT CONTRAST  TECHNIQUE: Contiguous axial images were obtained from the base of the skull through the vertex without intravenous contrast.  COMPARISON:  06/18/2010  FINDINGS: Sinuses/Soft tissues: Occipital scalp soft tissue swelling is moderate. Mucosal thickening involves both maxillary sinuses. Surgical changes within the paranasal  sinuses. Mild mucosal thickening left sphenoid sinus and bilateral ethmoid air cells. Right frontal sinus partial opacification. No skull fracture. Clear mastoid air cells.  Intracranial: Mild low density in the periventricular white matter likely related to small vessel disease. No mass lesion, hemorrhage, hydrocephalus, acute infarct, intra-axial, or extra-axial fluid collection.  IMPRESSION: Scalp soft tissue swelling, without acute intracranial abnormality.  Sinus disease.   Electronically Signed   By: Abigail Miyamoto M.D.   On: 11/29/2014 16:16    Oren Binet, MD  [facility] Pager:[phone]  If 7PM-7AM, please contact night-coverage [email or web address] Password [email or web address] 11/30/2014, 1:34 PM   LOS: 1 day

## 2014-11-30 NOTE — Consult Note (Signed)
Cardiology Consult Note  Admit date: 11/29/2014 Name: [name] 80 y.o.  female DOB:  27-Oct-1935 MRN:  [id number]  Today's date:  11/30/2014  Referring Physician:    [facility]  Primary Physician:    Dr. Sherren Mocha   Reason for Consultation:   Syncope   IMPRESSIONS: 1.  Syncopal episode-one would have to consider whether she had a primary arrhythmic event with this. 2.  Prior type A aortic dissection with aortic root repair and CABG 2 in 2011 3.  Chronic diastolic heart failure with tricuspid regurgitation and mild right heart failure 4.  Paroxysmal atrial fibrillation with recurrent epistaxis on warfarin, necessitating discontinuation but currently maintaining sinus rhythm. 5.  History of peripheral neuropathy. 6.  History of TIA. 7.  Chronic venous insufficiency. 8.  Anemia  RECOMMENDATION: 1.  Continue to observe on telemetry.  On discharge, she likely will need to wear a 30 day event monitor.  Alternately, one could consider using an implantable loop monitor.  If she does not wish to wear the 30 day event monitor. 2.  Repeat echocardiogram. 3.  Check orthostatic blood pressures. 4.  Repeat CBC to be sure that she is not having acute anemia or bleeding  HISTORY: This 80 year old female has a history of an atrial septal defect repair and also had a previous type A aortic dissection.  She had repair of this as well as bypass grafting previously.  She also has paroxysmal atrial fibrillation and has chronic diastolic heart failure but has been maintained on sinus rhythm since being on Tikosyn.  Because of recurrent epistaxis.  She was unable to tolerate warfarin and has not been on anything for anticoagulation.  She was her usual state of health yesterday when she went out for a walk and then went out running errands.  She was in the process of dropping off some clothing at a store that she goes to and walked in with 3 bags and then while standing in the doorway  talking to somebody had a syncopal episode where she fell striking her left temple and her back.  She states that she was conscious throughout all of this and did not have continence and did not have any prodromal symptoms or nausea.  She was brought to the hospital and had her head sutured up.  She has not had recurrent arrhythmias since admission on telemetry.  She has never had previous syncopal episodes before.  Notes no change in her recent medications or status.  Past Medical History  Diagnosis Date  . OSA (obstructive sleep apnea)     pt denies this  . HTN (hypertension)   . Atrial fibrillation     paroxysmal; on coumadin  . CAD (coronary artery disease)     a. cath 7/11: LM 40%, mild plaque disease in CFX, LAD, and RCA;  b. 2011 CABG with S-LAD and S-OM done at time of Aortic dissection repair  . Chronic diastolic CHF (congestive heart failure)     a. 08/2011 Echo: EF 55-60%, PASP 63mmHg  . Dissection of aorta, thoracic     a. Type A; s/p repair 7/11 with aortic root repair and CABG x 2   . ASD (atrial septal defect)     a. s/p repair 1982 at [facility]  . Right heart failure     a. 2/2 TR and RV dysfxn;  b. echo 4/12: EF 60%, mild LVH, mild AI, mild MR, mod LAE, mod RVE with mod dec. RVSF, mod RAE, mod to  severe TR, PASP 58;  c. right heart cath 4/12:  RA mean 8, RV 46/1 with mean 6, PA 45/13 with mean 26, PCWP mean 14, CO 3.68, CI 2.1 (no sig pulmon HTN)  . GERD (gastroesophageal reflux disease)   . IBS (irritable bowel syndrome)   . Esophageal stricture   . Colonic polyp   . Peripheral neuropathy   . Fibromyalgia   . Anxiety   . HLD (hyperlipidemia)   . Borderline diabetes   . History of TIAs   . Normocytic anemia   . Thrombocytopenia   . Macular degeneration   . Depression   . Esophageal dysmotilities   . Cancer     squamous cell CA  . Sleep apnea     pt denies OSA  . Anemia, iron deficiency 11/21/2014  . DJD (degenerative joint disease)   . Osteoarthritis       Past  Surgical History  Procedure Laterality Date  . Asd repair  1982    [facility]  . Shoulder open rotator cuff repair Right 2007  . Cardioversion  1982; 1995 X 2  . Appendectomy    . Vaginal hysterectomy  1987    A/P Repair With Cystocele and rectocele repair   . Salpingoophorectomy Bilateral 1994  . Pelvic laparoscopy  1994  . Emergency redo median sternotomy for hemiarch repair of acute type a aortic  dissection  06/05/2010  . Nasal hemorrhage control  12/27/2011    Procedure: EPISTAXIS CONTROL;  Surgeon: Ruby Cola, MD;  Location[facility] OR;  Service: ENT;  Laterality: N/A;  . Cataract extraction w/ intraocular lens  implant, bilateral Bilateral   . Cardiac catheterization  ~ 1971; 1982  . Esophagogastroduodenoscopy (egd) with esophageal dilation  "several times"  . Tubal ligation  1969  . Tonsillectomy and adenoidectomy  ~ 1944  . Nasal hemorrhage control  2013    "at [facility]"     Allergies:  is allergic to amiodarone; crestor; erythromycin; meperidine hcl; ropinirole hydrochloride; atorvastatin; codeine; erythromycin base; ezetimibe; meperidine; morphine; neomycin-bacitracin zn-polymyx; and simvastatin.   Medications: Prior to Admission medications   Medication Sig Start Date End Date Taking? Authorizing Provider  acetaminophen (TYLENOL) 325 MG tablet Take 650 mg by mouth every 6 (six) hours as needed for mild pain.   Yes Historical Provider, MD  aspirin EC 81 MG tablet Take 1 tablet (81 mg total) by mouth daily. 05/11/13  Yes Blane Ohara, MD  cholecalciferol (VITAMIN D) 1000 UNITS tablet Take 1,000 Units by mouth daily.    Yes Historical Provider, MD  clorazepate (TRANXENE) 7.5 MG tablet Take 1 tablet (7.5 mg total) by mouth at bedtime as needed. 06/27/14  Yes Biagio Borg, MD  docusate sodium (COLACE) 50 MG capsule Take 50 mg by mouth 2 (two) times daily.   Yes Historical Provider, MD  gabapentin (NEURONTIN) 300 MG capsule TAKE 2 CAPSULES BY MOUTH AT BEDTIME 11/12/14  Yes Biagio Borg, MD  glucosamine-chondroitin 500-400 MG tablet Take 1 tablet by mouth 2 (two) times daily.   Yes Historical Provider, MD  HYDROcodone-acetaminophen (NORCO/VICODIN) 5-325 MG per tablet Take 1 tablet by mouth every 6 (six) hours as needed for moderate pain.   Yes Historical Provider, MD  metoprolol tartrate (LOPRESSOR) 25 MG tablet Take 12.5 mg by mouth 2 (two) times daily.   Yes Historical Provider, MD  Multiple Vitamins-Minerals (EYE VITAMINS PO) Take 1 tablet by mouth 2 (two) times daily.    Yes Historical Provider, MD  omeprazole ([place])  20 MG capsule TAKE 1 CAPSULE BY MOUTH TWICE A DAY 30 MINUTES BEFORE MEALS 09/18/14  Yes Biagio Borg, MD  Polyethylene Glycol 3350 (MIRALAX PO) Take 1 packet by mouth at bedtime.    Yes Historical Provider, MD  potassium chloride SA (K-DUR,KLOR-CON) 20 MEQ tablet Take 20 mEq by mouth daily.   Yes Historical Provider, MD  senna-docusate (SENOKOT-S) 8.6-50 MG per tablet Take by mouth.   Yes Historical Provider, MD  sertraline (ZOLOFT) 50 MG tablet Take 1 tablet (50 mg total) by mouth daily. 07/24/14  Yes Biagio Borg, MD  spironolactone (ALDACTONE) 25 MG tablet Take 1 tablet (25 mg total) by mouth daily. 11/13/14  Yes Biagio Borg, MD  TIKOSYN 250 MCG capsule TAKE ONE CAPSULE BY MOUTH EVERY 12 HOURS 10/21/14  Yes Biagio Borg, MD  torsemide (DEMADEX) 20 MG tablet TAKE 3 TABLETS BY MOUTH DAILY AS DIRECTED 11/29/14  Yes Biagio Borg, MD  traMADol (ULTRAM) 50 MG tablet Take 1 tablet (50 mg total) by mouth every 8 (eight) hours as needed for moderate pain. 09/18/14  Yes Biagio Borg, MD  vitamin C (ASCORBIC ACID) 500 MG tablet Take 500 mg by mouth daily.   Yes Historical Provider, MD  potassium chloride SA (KLOR-CON M20) 20 MEQ tablet Take 1 tablet (20 mEq total) by mouth daily. 11/30/14   Biagio Borg, MD    Family History: No family status information on file.    Social History:   reports that she has never smoked. She has never used smokeless tobacco. She  reports that she does not drink alcohol or use illicit drugs.   History   Social History Narrative   Lives with spouse.    Review of Systems: Her weight has been stable.  She has chronic peripheral neuropathy and some mild edema for which she takes diuretics.  She has a prior esophageal stricture and is due to have a colonoscopy and endoscopy coming up later.  She has significant osteoarthritis with pain and also has known fibromyalgia.  No definite anginal pains.  Mild dyspnea on exertion.  Other than as noted above the remainder of the review of systems is unremarkable.  Physical Exam: BP 110/56 mmHg  Pulse 70  Temp(Src) 97.8 F (36.6 C) (Oral)  Resp 16  Ht 5\' 6"  (1.676 m)  Wt 70.761 kg (156 lb)  BMI 25.19 kg/m2  SpO2 98%  General appearance: Pleasant, talkative female in no acute distress Head: Normocephalic, without obvious abnormality, atraumatic, Suture present on left temple Eyes: conjunctivae/corneas clear. PERRL, EOM's intact. Fundi not examined  Neck: no adenopathy, no carotid bruit, no JVD and supple, symmetrical, trachea midline Lungs: clear to auscultation bilaterally and Ecchymoses noted on back Heart: regular rate and rhythm, S1, S2 normal, no murmur, click, rub or gallop Abdomen: soft, non-tender; bowel sounds normal; no masses,  no organomegaly Pelvic: deferred Extremities: Marked bilateral venous insufficiency changes noted, trace edema Pulses: 2+ and symmetric Skin: Ecchymoses noted in left upper back Neurologic: Grossly normal   Labs: CBC  Recent Labs  11/29/14 1510 11/30/14 0125  WBC 5.2 5.3  RBC 3.78* 3.50*  HGB 10.8* 9.9*  HCT 34.9* 32.1*  PLT 177 171  MCV 92.3 91.7  MCH 28.6 28.3  MCHC 30.9 30.8  RDW 14.2 14.2  LYMPHSABS 1.0  --   MONOABS 0.4  --   EOSABS 0.0  --   BASOSABS 0.0  --    CMP   Recent Labs  11/30/14 0125  NA 139  K 3.7  CL 99  CO2 31  GLUCOSE 96  BUN 23  CREATININE 0.89  CALCIUM 9.2  GFRNONAA 61*  GFRAA 71*    BNP (last 3 results)  Cardiac Panel (last 3 results)  Recent Labs  11/29/14 1843 11/30/14 0125 11/30/14 0734  TROPONINI <0.03 <0.03 <0.03     Radiology: Bebe Shaggy megaly, no acute changes  EKG: Anna's rhythm, nonspecific ST changes  Signed:  W. Doristine Church MD [facility]   Cardiology Consultant  11/30/2014, 5:42 PM

## 2014-11-30 NOTE — Progress Notes (Signed)
UR completed 

## 2014-12-01 DIAGNOSIS — I5032 Chronic diastolic (congestive) heart failure: Secondary | ICD-10-CM | POA: Diagnosis not present

## 2014-12-01 DIAGNOSIS — R55 Syncope and collapse: Secondary | ICD-10-CM | POA: Diagnosis not present

## 2014-12-01 DIAGNOSIS — I10 Essential (primary) hypertension: Secondary | ICD-10-CM | POA: Diagnosis not present

## 2014-12-01 DIAGNOSIS — E785 Hyperlipidemia, unspecified: Secondary | ICD-10-CM | POA: Diagnosis not present

## 2014-12-01 LAB — BASIC METABOLIC PANEL
Anion gap: 9 (ref 5–15)
BUN: 17 mg/dL (ref 6–23)
CO2: 28 mmol/L (ref 19–32)
Calcium: 9.6 mg/dL (ref 8.4–10.5)
Chloride: 103 mEq/L (ref 96–112)
Creatinine, Ser: 0.73 mg/dL (ref 0.50–1.10)
GFR calc Af Amer: >90 mL/min (ref >90)
GFR calc non Af Amer: 80 mL/min — ABNORMAL LOW (ref >90)
Glucose, Bld: 122 mg/dL — ABNORMAL HIGH (ref 70–99)
Potassium: 4 mmol/L (ref 3.5–5.1)
Sodium: 140 mmol/L (ref 135–145)

## 2014-12-01 NOTE — Progress Notes (Signed)
  Echocardiogram 2D Echocardiogram has been performed.  [name] M 12/01/2014, 10:33 AM

## 2014-12-01 NOTE — Discharge Summary (Signed)
PATIENT DETAILS Name: [name] Age: 80 y.o. Sex: female Date of Birth: Dec 30, 1934 MRN: [id number]. Admitting Physician: Caren Griffins, MD CBS:[id number] Felicia Reichmann, MD  Admit Date: 11/29/2014 Discharge date: 12/01/2014  Recommendations for Outpatient Follow-up:  1. Ensure follow-up with cardiology for placement of 30 day event monitor 2. Discontinued diuretics for now given orthostatic changes  PRIMARY DISCHARGE DIAGNOSIS:  Principal Problem:   Syncope Active Problems:   Hyperlipidiemia   Essential hypertension   Coronary atherosclerosis   Paroxysmal atrial fibrillation   Dissection of aorta, thoracic   CAD (coronary artery disease)      PAST MEDICAL HISTORY: Past Medical History  Diagnosis Date  . OSA (obstructive sleep apnea)     pt denies this  . HTN (hypertension)   . Atrial fibrillation     paroxysmal; on coumadin  . CAD (coronary artery disease)     a. cath 7/11: LM 40%, mild plaque disease in CFX, LAD, and RCA;  b. 2011 CABG with S-LAD and S-OM done at time of Aortic dissection repair  . Chronic diastolic CHF (congestive heart failure)     a. 08/2011 Echo: EF 55-60%, PASP 29mmHg  . Dissection of aorta, thoracic     a. Type A; s/p repair 7/11 with aortic root repair and CABG x 2   . ASD (atrial septal defect)     a. s/p repair 1982 at [facility]  . Right heart failure     a. 2/2 TR and RV dysfxn;  b. echo 4/12: EF 60%, mild LVH, mild AI, mild MR, mod LAE, mod RVE with mod dec. RVSF, mod RAE, mod to severe TR, PASP 58;  c. right heart cath 4/12:  RA mean 8, RV 46/1 with mean 6, PA 45/13 with mean 26, PCWP mean 14, CO 3.68, CI 2.1 (no sig pulmon HTN)  . GERD (gastroesophageal reflux disease)   . IBS (irritable bowel syndrome)   . Esophageal stricture   . Colonic polyp   . Peripheral neuropathy   . Fibromyalgia   . Anxiety   . HLD (hyperlipidemia)   . Borderline diabetes   . History of TIAs   . Normocytic anemia   . Thrombocytopenia   . Macular degeneration     . Depression   . Esophageal dysmotilities   . Cancer     squamous cell CA  . Sleep apnea     pt denies OSA  . Anemia, iron deficiency 11/21/2014  . DJD (degenerative joint disease)   . Osteoarthritis     DISCHARGE MEDICATIONS: Current Discharge Medication List    CONTINUE these medications which have NOT CHANGED   Details  acetaminophen (TYLENOL) 325 MG tablet Take 650 mg by mouth every 6 (six) hours as needed for mild pain.    aspirin EC 81 MG tablet Take 1 tablet (81 mg total) by mouth daily. Qty: 1 tablet, Refills: 0   Associated Diagnoses: Atrial fibrillation; Coronary atherosclerosis of unspecified type of vessel, native or graft    cholecalciferol (VITAMIN D) 1000 UNITS tablet Take 1,000 Units by mouth daily.     clorazepate (TRANXENE) 7.5 MG tablet Take 1 tablet (7.5 mg total) by mouth at bedtime as needed. Qty: 90 tablet, Refills: 1    docusate sodium (COLACE) 50 MG capsule Take 50 mg by mouth 2 (two) times daily.    gabapentin (NEURONTIN) 300 MG capsule TAKE 2 CAPSULES BY MOUTH AT BEDTIME Qty: 180 capsule, Refills: 3    glucosamine-chondroitin 500-400 MG tablet Take 1 tablet  by mouth 2 (two) times daily.    HYDROcodone-acetaminophen (NORCO/VICODIN) 5-325 MG per tablet Take 1 tablet by mouth every 6 (six) hours as needed for moderate pain.    metoprolol tartrate (LOPRESSOR) 25 MG tablet Take 12.5 mg by mouth 2 (two) times daily.    Multiple Vitamins-Minerals (EYE VITAMINS PO) Take 1 tablet by mouth 2 (two) times daily.     omeprazole (PRILOSEC) 20 MG capsule TAKE 1 CAPSULE BY MOUTH TWICE A DAY 30 MINUTES BEFORE MEALS Qty: 60 capsule, Refills: 6    Polyethylene Glycol 3350 (MIRALAX PO) Take 1 packet by mouth at bedtime.     senna-docusate (SENOKOT-S) 8.6-50 MG per tablet Take by mouth.    sertraline (ZOLOFT) 50 MG tablet Take 1 tablet (50 mg total) by mouth daily. Qty: 90 tablet, Refills: 3    TIKOSYN 250 MCG capsule TAKE ONE CAPSULE BY MOUTH EVERY 12  HOURS Qty: 180 capsule, Refills: 0    traMADol (ULTRAM) 50 MG tablet Take 1 tablet (50 mg total) by mouth every 8 (eight) hours as needed for moderate pain. Qty: 30 tablet, Refills: 1    vitamin C (ASCORBIC ACID) 500 MG tablet Take 500 mg by mouth daily.      STOP taking these medications     potassium chloride SA (K-DUR,KLOR-CON) 20 MEQ tablet      spironolactone (ALDACTONE) 25 MG tablet      torsemide (DEMADEX) 20 MG tablet      potassium chloride SA (KLOR-CON M20) 20 MEQ tablet         ALLERGIES:   Allergies  Allergen Reactions  . Amiodarone     Severe side effects per Pt--  . Crestor [Rosuvastatin] Other (See Comments)    Muscle weakness  . Erythromycin Other (See Comments)    unknown  . Meperidine Hcl Other (See Comments)    REACTION: dizziness  . Ropinirole Hydrochloride     REACTION: INTOL to Requip w/ sleep paralysis  . Atorvastatin Rash    REACTION: muscle pain  . Codeine Rash    REACTION: itching  . Erythromycin Base Rash  . Ezetimibe Rash    REACTION: INTOL to Zetia w/ cough  . Meperidine Rash  . Morphine Other (See Comments) and Rash    Pt states it "makes her crazy"  . Neomycin-Bacitracin Zn-Polymyx Other (See Comments) and Rash    blisters  . Simvastatin Rash    REACTION: unable to walk--muscle pain    BRIEF HPI:  See H&P, Labs, Consult and Test reports for all details in brief, patient was admitted for evaluation of a  syncopal/syncopal event.  CONSULTATIONS:   cardiology  PERTINENT RADIOLOGIC STUDIES: Dg Chest 2 View  11/29/2014   CLINICAL DATA:  [place] backwards while standing in line at store today. Hit head during fall.  EXAM: CHEST  2 VIEW  COMPARISON:  October 17, 2013.  FINDINGS: Stable cardiomegaly. Sternotomy wires are noted. No pneumothorax or pleural effusion is noted. No acute pulmonary disease is noted. 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  IMPRESSION: No acute cardiopulmonary abnormality seen.    Electronically Signed   By: Sabino Dick M.D.   On: 11/29/2014 15:47   Ct Head Wo Contrast  11/29/2014   CLINICAL DATA:  Initial encounter for syncope. Fall. Injury to left periorbital region.  EXAM: CT HEAD WITHOUT CONTRAST  TECHNIQUE: Contiguous axial images were obtained from the base of the skull through the vertex without intravenous contrast.  COMPARISON:  06/18/2010  FINDINGS:  Sinuses/Soft tissues: Occipital scalp soft tissue swelling is moderate. Mucosal thickening involves both maxillary sinuses. Surgical changes within the paranasal sinuses. Mild mucosal thickening left sphenoid sinus and bilateral ethmoid air cells. Right frontal sinus partial opacification. No skull fracture. Clear mastoid air cells.  Intracranial: Mild low density in the periventricular white matter likely related to small vessel disease. No mass lesion, hemorrhage, hydrocephalus, acute infarct, intra-axial, or extra-axial fluid collection.  IMPRESSION: Scalp soft tissue swelling, without acute intracranial abnormality.  Sinus disease.   Electronically Signed   By: Abigail Miyamoto M.D.   On: 11/29/2014 16:16     PERTINENT LAB RESULTS: CBC:  Recent Labs  11/30/14 0125 11/30/14 1841  WBC 5.3 5.5  HGB 9.9* 10.7*  HCT 32.1* 34.2*  PLT 171 186   CMET CMP     Component Value Date/Time   NA 140 12/01/2014 0525   K 4.0 12/01/2014 0525   CL 103 12/01/2014 0525   CO2 28 12/01/2014 0525   GLUCOSE 122* 12/01/2014 0525   BUN 17 12/01/2014 0525   CREATININE 0.73 12/01/2014 0525   CREATININE 0.91 04/26/2014 0001   CALCIUM 9.6 12/01/2014 0525   PROT 8.3 11/20/2014 1523   ALBUMIN 4.5 11/20/2014 1523   AST 17 11/20/2014 1523   ALT 9 11/20/2014 1523   ALKPHOS 70 11/20/2014 1523   BILITOT 0.8 11/20/2014 1523   GFRNONAA 80* 12/01/2014 0525   GFRAA >90 12/01/2014 0525    GFR Estimated Creatinine Clearance: 55.1 mL/min (by C-G formula based on Cr of 0.73). No results for input(s): LIPASE, AMYLASE in the last 72  hours.  Recent Labs  11/29/14 1843 11/30/14 0125 11/30/14 0734  TROPONINI <0.03 <0.03 <0.03   Invalid input(s): POCBNP No results for input(s): DDIMER in the last 72 hours. No results for input(s): HGBA1C in the last 72 hours. No results for input(s): CHOL, HDL, LDLCALC, TRIG, CHOLHDL, LDLDIRECT in the last 72 hours. No results for input(s): TSH, T4TOTAL, T3FREE, THYROIDAB in the last 72 hours.  Invalid input(s): FREET3 No results for input(s): VITAMINB12, FOLATE, FERRITIN, TIBC, IRON, RETICCTPCT in the last 72 hours. Coags: No results for input(s): INR in the last 72 hours.  Invalid input(s): PT Microbiology: Recent Results (from the past 240 hour(s))  MRSA PCR Screening     Status: Abnormal   Collection Time: 11/30/14 12:32 AM  Result Value Ref Range Status   MRSA by PCR POSITIVE (A) NEGATIVE Final    Comment:        The GeneXpert MRSA Assay (FDA approved for NASAL specimens only), is one component of a comprehensive MRSA colonization surveillance program. It is not intended to diagnose MRSA infection nor to guide or monitor treatment for MRSA infections. RESULT CALLED TO, READ BACK BY AND VERIFIED WITH: S BRASS,RN 01.16.16 [id number] M [place] COURSE:  Syncope/presyncope: Given significant cardiac history, patient was admitted and monitored in a telemetry unit. Cardiology was also consulted. No major arrhythmias were noted on telemetry. 2-D echocardiogram shows stable chronic changes, but preserved ejection fraction. Recommendations from cardiology are to follow up with primary cardiologist Dr. Burt Knack within a week for consideration of a 30 day event monitor. Patient has been instructed not to drive to seen by her cardiologist. She was mildly orthostatic on the day of discharge, although not sure if this actually contributed to her presyncopal event, but at the suggestion of cardiology discontinued all diuretics for now. Please evaluate at follow-up with  PCP or with cardiology.  Active Problems:  Atrial fibrillation: Remains in sinus rhythm. On Tikosyn. Not on anticoagulation given history of epistaxis. On aspirin   Essential hypertension: Controlled, continue with metoprolol.    Coronary atherosclerosis-status post CABG: Continue with aspirin, metoprolol. Without any chest pain or shortness of breath   History of type A aortic dissection -status post repair   TODAY-DAY OF DISCHARGE:  Subjective:   [name] today has no headache,no chest abdominal pain,no new weakness tingling or numbness, feels much better wants to go home today.   Objective:   Blood pressure 112/59, pulse 71, temperature 97.6 F (36.4 C), temperature source Oral, resp. rate 16, height 5\' 6"  (1.676 m), weight 70.761 kg (156 lb), SpO2 99 %. No intake or output data in the 24 hours ending 12/01/14 1510 Filed Weights   11/29/14 1908  Weight: 70.761 kg (156 lb)    Exam Awake Alert, Oriented *3, No new F.N deficits, Normal affect [place].AT,PERRAL Supple Neck,No JVD, No cervical lymphadenopathy appriciated.  Symmetrical Chest wall movement, Good air movement bilaterally, CTAB RRR,No Gallops,Rubs or new Murmurs, No Parasternal Heave +ve B.Sounds, Abd Soft, Non tender, No organomegaly appriciated, No rebound -guarding or rigidity. No Cyanosis, Clubbing or edema, No new Rash or bruise  DISCHARGE CONDITION: Stable  DISPOSITION: Home  DISCHARGE INSTRUCTIONS:    Activity:  As tolerated  Diet recommendation: Heart Healthy diet  Discharge Instructions    (HEART FAILURE PATIENTS) Call MD:  Anytime you have any of the following symptoms: 1) 3 pound weight gain in 24 hours or 5 pounds in 1 week 2) shortness of breath, with or without a dry hacking cough 3) swelling in the hands, feet or stomach 4) if you have to sleep on extra pillows at night in order to breathe.    Complete by:  As directed      Call MD for:  persistant dizziness or light-headedness     Complete by:  As directed      Call MD for:    Complete by:  As directed   Loss of consciousness     Diet - low sodium heart healthy    Complete by:  As directed      Driving Restrictions    Complete by:  As directed   Till seen by Dr Burt Knack and cleared     Increase activity slowly    Complete by:  As directed            Follow-up Information    Follow up with Sherren Mocha, MD In 1 week.   Specialty:  Cardiology   Why:  Call Dr. Antionette Char office tomorrow in order to have a 30 day event monitor placed.   Contact information:   [address] [place] [address] [phone]       Follow up with Cathlean Cower, MD. Schedule an appointment as soon as possible for a visit in 2 weeks.   Specialties:  Internal Medicine, Radiology   Contact information:   [place] [address] [phone]         Total Time spent on discharge equals 45 minutes.  SignedOren Binet 12/01/2014 3:10 PM

## 2014-12-01 NOTE — Progress Notes (Signed)
Utilization review completed.  

## 2014-12-01 NOTE — Discharge Instructions (Signed)
No driving until seen by your cardiologist

## 2014-12-01 NOTE — Progress Notes (Addendum)
Subjective:  Complains of soreness from her fall.  No shortness of breath or chest pain.  Objective:  Vital Signs in the last 24 hours: BP 129/67 mmHg  Pulse 68  Temp(Src) 97.5 F (36.4 C) (Oral)  Resp 18  Ht 5\' 6"  (1.676 m)  Wt 70.761 kg (156 lb)  BMI 25.19 kg/m2  SpO2 99% Orthostatic VS for the past 24 hrs:  BP- Lying Pulse- Lying BP- Sitting Pulse- Sitting BP- Standing at 0 minutes Pulse- Standing at 0 minutes  11/30/14 1812 120/59 mmHg 73 124/59 mmHg 76 110/59 mmHg 79   Physical Exam: Pleasant, talkative white female in no acute distress Lungs:  Clear  Cardiac:  Regular rhythm, normal S1 and S2, no S3, 2/6 systolic murmur lsb Abdomen:  Soft, nontender, no masses Extremities:  No edema noted, significant chronic venous stasis noted.  Intake/Output from previous day: 01/16 0701 - 01/17 0700 In: 200 [P.O.:200] Out: 1 [Stool:1] Weight Filed Weights   11/29/14 1908  Weight: 70.761 kg (156 lb)    Lab Results: Basic Metabolic Panel:  Recent Labs  11/30/14 0125 12/01/14 0525  NA 139 140  K 3.7 4.0  CL 99 103  CO2 31 28  GLUCOSE 96 122*  BUN 23 17  CREATININE 0.89 0.73    CBC:  Recent Labs  11/29/14 1510 11/30/14 0125 11/30/14 1841  WBC 5.2 5.3 5.5  NEUTROABS 3.8  --  4.0  HGB 10.8* 9.9* 10.7*  HCT 34.9* 32.1* 34.2*  MCV 92.3 91.7 90.7  PLT 177 171 186    BNP    Component Value Date/Time   PROBNP 287.0* 10/24/2013 0820   Telemetry: No significant bradycardia noted, normal sinus rhythm, occasional junctional beats noted.  Echo:  EF 50-55%, Severe eccentric MR posteriorly directed.  Moderate TR with mild elevation of RVSP, grade 3 diastolic dysfunction  Assessment/Plan:  1.  Syncopal episode, type undetermined-she is slightly orthostatic today but not severe and has no symptoms.  No bradycardia arrhythmias noted now. 2.  Previous history of repair of type A dissection. 3.  Severe chronic venous insufficiency. 4.  Chronic diastolic heart  failure with tricuspid regurgitation, mild right heart failure. 5. Significant MR on ECHO  Recommendations:  Clinically is better and could probably go home.  I would have her discontinue her furosemide and were venous support stockings to help with orthostatic changes.  Talked about liberalizing her sodium intake. Will need to f/u MR with her regular cardiologist at followup.   She will need a 24-hour event monitor for 30 days at the minimum.  Follow-up in one week with Dr. Burt Knack.  In addition, she should not drive until she is cleared for this by Dr. Burt Knack.     Kerry Hough  MD [facility] Card[facility]  12/01/2014, 10:46 AM

## 2014-12-02 ENCOUNTER — Telehealth: Payer: Self-pay | Admitting: Cardiovascular Disease

## 2014-12-02 NOTE — Telephone Encounter (Signed)
12-02-14 326pm pt calling back, wants to "get on a heart monitor" "her wings are clipped" until Dr.Cooper releases her to drive, wants a call asap

## 2014-12-02 NOTE — Telephone Encounter (Signed)
I spoke with pt & she accepted appointment with Richardson Dopp PA for 12/04/14 at 2pm She is not able to drive at this time & states she was told at the hospital last Friday she would need to see Dr. Burt Knack before she can drive again. States she  Had no chest pain, no forwarning, did not lose consciousness.  She would like Dr. Burt Knack to review her chart from the hospital ( 11/29/14  ECHO, cxr, CT head)  Pt would like Lauren to give her a call tomorrow if there is a way she can see Dr. Burt Knack sooner.  I will forward this to Dr. Burt Knack & Ander Purpura.

## 2014-12-02 NOTE — Telephone Encounter (Signed)
New Message  Pt was seen in the hospital over the weekend. Per chart schedule Follow up with Sherren Mocha, MD In 1 week. No appt's available. Please let me know how you would like for [place] to schedule this appt

## 2014-12-03 ENCOUNTER — Ambulatory Visit (INDEPENDENT_AMBULATORY_CARE_PROVIDER_SITE_OTHER): Payer: Medicare Other | Admitting: Internal Medicine

## 2014-12-03 ENCOUNTER — Encounter: Payer: Self-pay | Admitting: Internal Medicine

## 2014-12-03 ENCOUNTER — Encounter: Payer: Self-pay | Admitting: *Deleted

## 2014-12-03 ENCOUNTER — Encounter: Payer: Self-pay | Admitting: Cardiovascular Disease

## 2014-12-03 ENCOUNTER — Ambulatory Visit (INDEPENDENT_AMBULATORY_CARE_PROVIDER_SITE_OTHER): Payer: Medicare Other | Admitting: Cardiovascular Disease

## 2014-12-03 ENCOUNTER — Ambulatory Visit: Payer: Medicare Other | Admitting: Cardiovascular Disease

## 2014-12-03 VITALS — BP 124/74 | HR 66 | Ht 66.0 in | Wt 161.0 lb

## 2014-12-03 VITALS — BP 132/70 | HR 68 | Temp 97.7°F | Ht 66.0 in | Wt 161.0 lb

## 2014-12-03 DIAGNOSIS — I10 Essential (primary) hypertension: Secondary | ICD-10-CM

## 2014-12-03 DIAGNOSIS — F341 Dysthymic disorder: Secondary | ICD-10-CM

## 2014-12-03 DIAGNOSIS — S0181XA Laceration without foreign body of other part of head, initial encounter: Secondary | ICD-10-CM | POA: Insufficient documentation

## 2014-12-03 DIAGNOSIS — R55 Syncope and collapse: Secondary | ICD-10-CM

## 2014-12-03 DIAGNOSIS — S0181XD Laceration without foreign body of other part of head, subsequent encounter: Secondary | ICD-10-CM

## 2014-12-03 MED ORDER — SPIRONOLACTONE 25 MG PO TABS: 12.5000 mg | ORAL_TABLET | Freq: Every day | ORAL | Status: DC

## 2014-12-03 MED ORDER — TORSEMIDE 20 MG PO TABS: 40.0000 mg | ORAL_TABLET | Freq: Every day | ORAL | Status: DC

## 2014-12-03 NOTE — Assessment & Plan Note (Signed)
stable overall by history and exam, recent data reviewed with pt, and pt to continue medical treatment as before,  to f/u any worsening symptoms or concerns BP Readings from Last 3 Encounters:  12/03/14 132/70  12/03/14 124/74  12/01/14 112/59

## 2014-12-03 NOTE — Assessment & Plan Note (Signed)
stable overall by history and exam, recent data reviewed with pt, and pt to continue medical treatment as before,  to f/u any worsening symptoms or concerns Lab Results  Component Value Date   WBC 5.5 11/30/2014   HGB 10.7* 11/30/2014   HCT 34.2* 11/30/2014   PLT 186 11/30/2014   GLUCOSE 122* 12/01/2014   CHOL 213* 10/24/2013   TRIG 84.0 10/24/2013   HDL 47.60 10/24/2013   LDLDIRECT 156.7 10/24/2013   LDLCALC 82 09/07/2011   ALT 9 11/20/2014   AST 17 11/20/2014   NA 140 12/01/2014   K 4.0 12/01/2014   CL 103 12/01/2014   CREATININE 0.73 12/01/2014   BUN 17 12/01/2014   CO2 28 12/01/2014   TSH 2.16 11/20/2014   INR 0.98 01/01/2014   HGBA1C 6.4 11/20/2014

## 2014-12-03 NOTE — Progress Notes (Signed)
Pre visit review using our clinic review tool, if applicable. No additional management support is needed unless otherwise documented below in the visit note. 

## 2014-12-03 NOTE — Telephone Encounter (Signed)
Pt scheduled to see Dr Burt Knack 12/03/14.

## 2014-12-03 NOTE — Progress Notes (Signed)
Subjective:    Patient ID: [name], female    DOB: [birth year], 80 y.o.   MRN: [id number]  HPI  Here after semi-fall 4 days ago, was standing besides 2 ladies taking donations at the [employer] after walking prior her usual mile per day, started to lean to left per witness and hit wall, no LOC, laceration to left temple area just upper/lat to left eye, s/p 3 stitches in ER.  No obvious explanation after hospn overnight, for cardiac monitor to be placed tomorrow, seen per Dr Wynonia Lawman. Pt denies chest pain, increased sob or doe, wheezing, orthopnea, PND, increased LE swelling, palpitations, dizziness or syncope since the episode. Pt denies new neurological symptoms such as new headache, or facial or extremity weakness or numbness   Pt denies polydipsia, polyuria,  Has hx of bradycardia, but pt is not clear on details.  Still taking iron as instructed, though hard on her GI. Diuretic stopped due to orthostasis last hospn, but now back on torsemide (reduced dose) due to  Hx of diast chf. Denies worsening depressive symptoms, suicidal ideation, or panic Past Medical History  Diagnosis Date  . OSA (obstructive sleep apnea)     pt denies this  . HTN (hypertension)   . Atrial fibrillation     paroxysmal; on coumadin  . CAD (coronary artery disease)     a. cath 7/11: LM 40%, mild plaque disease in CFX, LAD, and RCA;  b. 2011 CABG with S-LAD and S-OM done at time of Aortic dissection repair  . Chronic diastolic CHF (congestive heart failure)     a. 08/2011 Echo: EF 55-60%, PASP 24mmHg  . Dissection of aorta, thoracic     a. Type A; s/p repair 7/11 with aortic root repair and CABG x 2   . ASD (atrial septal defect)     a. s/p repair 1982 at [facility]  . Right heart failure     a. 2/2 TR and RV dysfxn;  b. echo 4/12: EF 60%, mild LVH, mild AI, mild MR, mod LAE, mod RVE with mod dec. RVSF, mod RAE, mod to severe TR, PASP 58;  c. right heart cath 4/12:  RA mean 8, RV 46/1 with mean 6, PA 45/13 with  mean 26, PCWP mean 14, CO 3.68, CI 2.1 (no sig pulmon HTN)  . GERD (gastroesophageal reflux disease)   . IBS (irritable bowel syndrome)   . Esophageal stricture   . Colonic polyp   . Peripheral neuropathy   . Fibromyalgia   . Anxiety   . HLD (hyperlipidemia)   . Borderline diabetes   . History of TIAs   . Normocytic anemia   . Thrombocytopenia   . Macular degeneration   . Depression   . Esophageal dysmotilities   . Cancer     squamous cell CA  . Sleep apnea     pt denies OSA  . Anemia, iron deficiency 11/21/2014  . DJD (degenerative joint disease)   . Osteoarthritis    Past Surgical History  Procedure Laterality Date  . Asd repair  1982    [facility]  . Shoulder open rotator cuff repair Right 2007  . Cardioversion  1982; 1995 X 2  . Appendectomy    . Vaginal hysterectomy  1987    A/P Repair With Cystocele and rectocele repair   . Salpingoophorectomy Bilateral 1994  . Pelvic laparoscopy  1994  . Emergency redo median sternotomy for hemiarch repair of acute type a aortic  dissection  06/05/2010  .  Nasal hemorrhage control  12/27/2011    Procedure: EPISTAXIS CONTROL;  Surgeon: Ruby Cola, MD;  Location: [facility];  Service: ENT;  Laterality: N/A;  . Cataract extraction w/ intraocular lens  implant, bilateral Bilateral   . Cardiac catheterization  ~ 1971; 1982  . Esophagogastroduodenoscopy (egd) with esophageal dilation  "several times"  . Tubal ligation  1969  . Tonsillectomy and adenoidectomy  ~ 1944  . Nasal hemorrhage control  2013    "at [facility]"    reports that she has never smoked. She has never used smokeless tobacco. She reports that she does not drink alcohol or use illicit drugs. family history includes ALS in her mother; Colon polyps in her sister; Diabetes in her mother; Heart attack in her brother; Heart disease in her brother; Hypertension in her mother; Lung cancer in her brother; Melanoma in her sister; Osteoporosis in her sister; Pancreatic cancer in her sister.  There is no history of Esophageal cancer, Colon cancer, Rectal cancer, or Stomach cancer. Allergies  Allergen Reactions  . Amiodarone     Severe side effects per Pt--  . Crestor [Rosuvastatin] Other (See Comments)    Muscle weakness  . Erythromycin Other (See Comments)    unknown  . Meperidine Hcl Other (See Comments)    REACTION: dizziness  . Ropinirole Hydrochloride     REACTION: INTOL to Requip w/ sleep paralysis  . Atorvastatin Rash    REACTION: muscle pain  . Codeine Rash    REACTION: itching  . Erythromycin Base Rash  . Ezetimibe Rash    REACTION: INTOL to Zetia w/ cough  . Meperidine Rash  . Morphine Other (See Comments) and Rash    Pt states it "makes her crazy"  . Neomycin-Bacitracin Zn-Polymyx Other (See Comments) and Rash    blisters  . Simvastatin Rash    REACTION: unable to walk--muscle pain   Current Outpatient Prescriptions on File Prior to Visit  Medication Sig Dispense Refill  . acetaminophen (TYLENOL) 325 MG tablet Take 650 mg by mouth every 6 (six) hours as needed for mild pain.    Marland Kitchen aspirin EC 81 MG tablet Take 1 tablet (81 mg total) by mouth daily. 1 tablet 0  . cholecalciferol (VITAMIN D) 1000 UNITS tablet Take 1,000 Units by mouth daily.     . clorazepate (TRANXENE) 7.5 MG tablet Take 1 tablet (7.5 mg total) by mouth at bedtime as needed. 90 tablet 1  . docusate sodium (COLACE) 50 MG capsule Take 50 mg by mouth 2 (two) times daily.    Marland Kitchen gabapentin (NEURONTIN) 300 MG capsule TAKE 2 CAPSULES BY MOUTH AT BEDTIME 180 capsule 3  . glucosamine-chondroitin 500-400 MG tablet Take 1 tablet by mouth 2 (two) times daily.    Marland Kitchen HYDROcodone-acetaminophen (NORCO/VICODIN) 5-325 MG per tablet Take 1 tablet by mouth every 6 (six) hours as needed for moderate pain.    . metoprolol tartrate (LOPRESSOR) 25 MG tablet Take 12.5 mg by mouth 2 (two) times daily.    . Multiple Vitamins-Minerals (EYE VITAMINS PO) Take 1 tablet by mouth 2 (two) times daily.     Marland Kitchen omeprazole  (PRILOSEC) 20 MG capsule TAKE 1 CAPSULE BY MOUTH TWICE A DAY 30 MINUTES BEFORE MEALS 60 capsule 6  . Polyethylene Glycol 3350 (MIRALAX PO) Take 1 packet by mouth at bedtime.     . sertraline (ZOLOFT) 50 MG tablet Take 1 tablet (50 mg total) by mouth daily. 90 tablet 3  . TIKOSYN 250 MCG capsule TAKE ONE CAPSULE  BY MOUTH EVERY 12 HOURS 180 capsule 0  . traMADol (ULTRAM) 50 MG tablet Take 1 tablet (50 mg total) by mouth every 8 (eight) hours as needed for moderate pain. 30 tablet 1  . vitamin C (ASCORBIC ACID) 500 MG tablet Take 500 mg by mouth daily.     No current facility-administered medications on file prior to visit.   Review of Systems  Constitutional: Negative for unusual diaphoresis or other sweats  HENT: Negative for ringing in ear Eyes: Negative for double vision or worsening visual disturbance.  Respiratory: Negative for choking and stridor.   Gastrointestinal: Negative for vomiting or other signifcant bowel change Genitourinary: Negative for hematuria or decreased urine volume.  Musculoskeletal: Negative for other MSK pain or swelling Skin: Negative for color change and worsening wound.  Neurological: Negative for tremors and numbness other than noted  Psychiatric/Behavioral: Negative for decreased concentration or agitation other than above       Objective:   Physical Exam BP 132/70 mmHg  Pulse 68  Temp(Src) 97.7 F (36.5 C) (Oral)  Ht 5\' 6"  (1.676 m)  Wt 161 lb (73.029 kg)  BMI 26.00 kg/m2  SpO2 97% VS noted,  Constitutional: Pt appears well-developed, well-nourished.  HENT: Head: NCAT.  Right Ear: External ear normal.  Left Ear: External ear normal.  Eyes: . Pupils are equal, round, and reactive to light. Conjunctivae and EOM are normal Neck: Normal range of motion. Neck supple.  Cardiovascular: Normal rate and regular rhythm.   Pulmonary/Chest: Effort normal and breath sounds without rales or wheezing.  Neurological: Pt is alert. Not confused , motor grossly  intact Skin: Skin is warm. No rash; laeration left periorbital intact, no celultiis, 3 stitche removed Psychiatric: Pt behavior is normal. No agitation.     Assessment & Plan:

## 2014-12-03 NOTE — Progress Notes (Signed)
Cardiology Office Note   Date:  12/03/2014   ID:  [name], DOB 1935/01/16, MRN [id number]  PCP:  Cathlean Cower, MD  Cardiologist:  Sherren Mocha, MD    No chief complaint on file.    History of Present Illness: [name] is a 80 y.o. female who presents for   The patient is followed for a history of remote surgical repair of an atrial septal defect. In 2011 she presented with a type A aortic dissection and underwent emergent surgical repair. She was treated with multivessel CABG at that time as well. The patient has also been followed for atrial fibrillation, severe tricuspid regurgitation, and chronic congestive heart failure. She has been unable to tolerate warfarin because of severe recurrent epistaxis requiring ENT surgeries. She's had another episode of epistaxis off warfarin requiring packing and further treatment at [facility].  She presents today for hospital follow-up after a brief hospitalization for syncope. She was standing up and talking in a store when she fell against a wall and then to the floor without warning. She denies loss of consciousness. She had no palpitations, shortness of breath, chest pain, or any other prodromal symptoms. She fell and bruised her face, also had a laceration over the left eye, and bruised her left back/shoulder. No previous history of syncope or falls.   Past Medical History  Diagnosis Date  . OSA (obstructive sleep apnea)     pt denies this  . HTN (hypertension)   . Atrial fibrillation     paroxysmal; on coumadin  . CAD (coronary artery disease)     a. cath 7/11: LM 40%, mild plaque disease in CFX, LAD, and RCA;  b. 2011 CABG with S-LAD and S-OM done at time of Aortic dissection repair  . Chronic diastolic CHF (congestive heart failure)     a. 08/2011 Echo: EF 55-60%, PASP 57mmHg  . Dissection of aorta, thoracic     a. Type A; s/p repair 7/11 with aortic root repair and CABG x 2   . ASD (atrial septal defect)     a. s/p  repair 1982 at [facility]  . Right heart failure     a. 2/2 TR and RV dysfxn;  b. echo 4/12: EF 60%, mild LVH, mild AI, mild MR, mod LAE, mod RVE with mod dec. RVSF, mod RAE, mod to severe TR, PASP 58;  c. right heart cath 4/12:  RA mean 8, RV 46/1 with mean 6, PA 45/13 with mean 26, PCWP mean 14, CO 3.68, CI 2.1 (no sig pulmon HTN)  . GERD (gastroesophageal reflux disease)   . IBS (irritable bowel syndrome)   . Esophageal stricture   . Colonic polyp   . Peripheral neuropathy   . Fibromyalgia   . Anxiety   . HLD (hyperlipidemia)   . Borderline diabetes   . History of TIAs   . Normocytic anemia   . Thrombocytopenia   . Macular degeneration   . Depression   . Esophageal dysmotilities   . Cancer     squamous cell CA  . Sleep apnea     pt denies OSA  . Anemia, iron deficiency 11/21/2014  . DJD (degenerative joint disease)   . Osteoarthritis     Past Surgical History  Procedure Laterality Date  . Asd repair  1982    [facility]  . Shoulder open rotator cuff repair Right 2007  . Cardioversion  1982; 1995 X 2  . Appendectomy    . Vaginal hysterectomy  1987    A/P Repair With Cystocele and rectocele repair   . Salpingoophorectomy Bilateral 1994  . Pelvic laparoscopy  1994  . Emergency redo median sternotomy for hemiarch repair of acute type a aortic  dissection  06/05/2010  . Nasal hemorrhage control  12/27/2011    Procedure: EPISTAXIS CONTROL;  Surgeon: Ruby Cola, MD;  Location: [facility] OR;  Service: ENT;  Laterality: N/A;  . Cataract extraction w/ intraocular lens  implant, bilateral Bilateral   . Cardiac catheterization  ~ 1971; 1982  . Esophagogastroduodenoscopy (egd) with esophageal dilation  "several times"  . Tubal ligation  1969  . Tonsillectomy and adenoidectomy  ~ 1944  . Nasal hemorrhage control  2013    "at [facility]"    Current Outpatient Prescriptions  Medication Sig Dispense Refill  . acetaminophen (TYLENOL) 325 MG tablet Take 650 mg by mouth every 6 (six) hours as needed  for mild pain.    Marland Kitchen aspirin EC 81 MG tablet Take 1 tablet (81 mg total) by mouth daily. 1 tablet 0  . cholecalciferol (VITAMIN D) 1000 UNITS tablet Take 1,000 Units by mouth daily.     . clorazepate (TRANXENE) 7.5 MG tablet Take 1 tablet (7.5 mg total) by mouth at bedtime as needed. 90 tablet 1  . docusate sodium (COLACE) 50 MG capsule Take 50 mg by mouth 2 (two) times daily.    Marland Kitchen gabapentin (NEURONTIN) 300 MG capsule TAKE 2 CAPSULES BY MOUTH AT BEDTIME 180 capsule 3  . glucosamine-chondroitin 500-400 MG tablet Take 1 tablet by mouth 2 (two) times daily.    Marland Kitchen HYDROcodone-acetaminophen (NORCO/VICODIN) 5-325 MG per tablet Take 1 tablet by mouth every 6 (six) hours as needed for moderate pain.    . metoprolol tartrate (LOPRESSOR) 25 MG tablet Take 12.5 mg by mouth 2 (two) times daily.    . Multiple Vitamins-Minerals (EYE VITAMINS PO) Take 1 tablet by mouth 2 (two) times daily.     Marland Kitchen omeprazole (PRILOSEC) 20 MG capsule TAKE 1 CAPSULE BY MOUTH TWICE A DAY 30 MINUTES BEFORE MEALS 60 capsule 6  . Polyethylene Glycol 3350 (MIRALAX PO) Take 1 packet by mouth at bedtime.     . sertraline (ZOLOFT) 50 MG tablet Take 1 tablet (50 mg total) by mouth daily. 90 tablet 3  . TIKOSYN 250 MCG capsule TAKE ONE CAPSULE BY MOUTH EVERY 12 HOURS 180 capsule 0  . traMADol (ULTRAM) 50 MG tablet Take 1 tablet (50 mg total) by mouth every 8 (eight) hours as needed for moderate pain. 30 tablet 1  . vitamin C (ASCORBIC ACID) 500 MG tablet Take 500 mg by mouth daily.     No current facility-administered medications for this visit.    Allergies:   Amiodarone; Crestor; Erythromycin; Meperidine hcl; Ropinirole hydrochloride; Atorvastatin; Codeine; Erythromycin base; Ezetimibe; Meperidine; Morphine; Neomycin-bacitracin zn-polymyx; and Simvastatin   Social History:  The patient  reports that she has never smoked. She has never used smokeless tobacco. She reports that she does not drink alcohol or use illicit drugs.   Family  History:  The patient's  family history includes ALS in her mother; Colon polyps in her sister; Diabetes in her mother; Heart attack in her brother; Heart disease in her brother; Hypertension in her mother; Lung cancer in her brother; Melanoma in her sister; Osteoporosis in her sister; Pancreatic cancer in her sister. There is no history of Esophageal cancer, Colon cancer, Rectal cancer, or Stomach cancer.    ROS:  Please see the history of present  illness.  Otherwise, review of systems is positive for orthopnea, back pain, myalgias, snoring, constipation, nausea, anxiety, and balance problems. All other systems are reviewed and negative.    PHYSICAL EXAM: VS:  BP 124/74 mmHg  Pulse 66  Ht 5\' 6"  (1.676 m)  Wt 161 lb (73.029 kg)  BMI 26.00 kg/m2 , BMI Body mass index is 26 kg/(m^2). GEN: Well nourished, well developed, in no acute distress HEENT: Intact sutures above the left eye. Small scalp hematoma present. Neck: Large V waves noted, carotid bruits, or masses Cardiac: RRR with grade 3/6 holosystolic murmur at the left lower sternal border and apex, trace pretibial edema.  Respiratory:  clear to auscultation bilaterally, normal work of breathing GI: soft, nontender, nondistended, + BS MS: no deformity or atrophy Skin: warm and dry, no rash Neuro:  Strength and sensation are intact Psych: euthymic mood, full affect  EKG:  EKG is not ordered today.  Recent Labs: 11/20/2014: ALT 9; TSH 2.16 11/30/2014: Hemoglobin 10.7*; Magnesium 2.5; Platelets 186 12/01/2014: BUN 17; Creatinine 0.73; Potassium 4.0; Sodium 140   Lipid Panel     Component Value Date/Time   CHOL 213* 10/24/2013 0820   TRIG 84.0 10/24/2013 0820   HDL 47.60 10/24/2013 0820   CHOLHDL 4 10/24/2013 0820   VLDL 16.8 10/24/2013 0820   LDLCALC 82 09/07/2011 1051   LDLDIRECT 156.7 10/24/2013 0820      Wt Readings from Last 3 Encounters:  12/03/14 161 lb (73.029 kg)  11/29/14 156 lb (70.761 kg)  11/20/14 157 lb (71.215  kg)     Other studies Reviewed: 2D Echo: Study Conclusions  - Left ventricle: The cavity size was normal. Systolic function was normal. The estimated ejection fraction was in the range of 50% to 55%. Wall motion was normal; there were no regional wall motion abnormalities. Doppler parameters are consistent with a reversible restrictive pattern, indicative of decreased left ventricular diastolic compliance and/or increased left atrial pressure (grade 3 diastolic dysfunction). - Aortic valve: There was mild regurgitation. - Aorta: Prior aortic repair, mildly dilated aorta. - Mitral valve: There was severe regurgitation directed eccentrically and posteriorly. - Left atrium: The atrium was moderately to severely dilated. - Right ventricle: The cavity size was mildly dilated. Wall thickness was normal. Systolic function was mildly reduced. - Right atrium: The atrium was severely dilated. - Tricuspid valve: There was moderate regurgitation. - Pulmonary arteries: Systolic pressure was mildly increased. PA peak pressure: 43 mm Hg (S).   ASSESSMENT AND PLAN: 50. 80 year old woman with presyncope. The patient had a sudden event, with most likely etiology is either hemodynamic related to low blood pressure or arrhythmic. I have reviewed all of her hospital records. I have reviewed her 2-D echocardiogram results. Recommend a 30 day event monitor to evaluate for arrhythmia. She otherwise appears well. Her diuretic medications were held in the hospital. I advised her to start back on a reduced dose of torsemide 40 mg daily and a reduced dose of spironolactone 12.5 mg daily. Will arrange follow-up in 3 months and will review her event monitor as soon as available. Since she did not experience frank syncope and otherwise appears stable from a cardiac perspective, I advised that she can resume her normal activities including driving.  2. Paroxysmal atrial fibrillation: maintaining sinus  rhythm onTikosyn. This drug has changed her life as she had a great deal of difficulty with AF and heart failure prior to this. Off anticoagulation because of recurrent bleeding events. EKG reviewed and normal QT interval  noted.   3. Chronic diastolic and right-sided heart failure.  Overall stable with NYHA Class 2 CHF. Will follow-up in 3 months.  4. CAD - no sx's of angina.  5. Aortic dissection, Type A. Serial imaging follow-up as directed by Dr Roxy Manns.  6. Severe mitral regurgitation. She currently has good functional capacity, but I'm concerned that she has developed worsening mitral regurgitation. I will review her echo images. She's had 2 previous cardiac surgeries and her risk of another cardiac surgery would be quite high. Will continue with medical therapy for now she will have close outpatient follow-up as outlined above. Diuretics were resumed today.  Current medicines are reviewed with the patient today.  The patient does not have concerns regarding medicines.  The following changes have been made:    Resume torsemide at reduced dose of 40 mg daily  Resume aldactone at reduced dose of 12. 5 mg daily  Labs/ tests ordered today include:  30 day event monitor No orders of the defined types were placed in this encounter.    Disposition:   FU with me in 3 months  Signed, Sherren Mocha, MD  12/03/2014 2:05 PM    [facility] [address], [place], [place]  [address] Phone: [phone]; Fax: [phone]

## 2014-12-03 NOTE — Patient Instructions (Addendum)
Your physician has recommended that you wear a 30 day event monitor. Event monitors are medical devices that record the heart's electrical activity. Doctors most often [place] these monitors to diagnose arrhythmias. Arrhythmias are problems with the speed or rhythm of the heartbeat. The monitor is a small, portable device. You can wear one while you do your normal daily activities. This is usually used to diagnose what is causing palpitations/syncope (passing out).  Your physician has recommended you make the following change in your medication:  1. START Torsemide to 40mg  take one by mouth daily 2. START Aldactone (Spironolactone) 25mg  take one-half tablet by mouth daily  Monitor your weight.  Your physician recommends that you schedule a follow-up appointment in: 3 MONTHS with Dr Burt Knack

## 2014-12-03 NOTE — Assessment & Plan Note (Signed)
Stiches removed, no evidence for infection,  to f/u any worsening symptoms or concerns

## 2014-12-03 NOTE — Patient Instructions (Signed)
Your 3 stitches were removed today  Please continue all other medications as before, and refills have been done if requested.  Please have the pharmacy call with any other refills you may need.  Please keep your appointments with your specialists as you may have planned

## 2014-12-04 ENCOUNTER — Encounter: Payer: Self-pay | Admitting: *Deleted

## 2014-12-04 ENCOUNTER — Encounter (INDEPENDENT_AMBULATORY_CARE_PROVIDER_SITE_OTHER): Payer: Medicare Other

## 2014-12-04 ENCOUNTER — Ambulatory Visit: Payer: Medicare Other | Admitting: Physician Assistant

## 2014-12-04 DIAGNOSIS — R55 Syncope and collapse: Secondary | ICD-10-CM

## 2014-12-04 NOTE — Progress Notes (Signed)
Patient ID: [name], female   DOB: [birth year], 80 y.o.   MRN: [id number] [employer] 30 day cardiac event monitor applied to patient.

## 2014-12-06 ENCOUNTER — Encounter: Payer: Medicare Other | Admitting: Internal Medicine

## 2015-01-08 ENCOUNTER — Encounter: Payer: Self-pay | Admitting: Women's Health

## 2015-01-08 ENCOUNTER — Ambulatory Visit (INDEPENDENT_AMBULATORY_CARE_PROVIDER_SITE_OTHER): Payer: Medicare Other | Admitting: Women's Health

## 2015-01-08 ENCOUNTER — Telehealth: Payer: Self-pay | Admitting: *Deleted

## 2015-01-08 VITALS — BP 120/82 | Ht 66.0 in | Wt 161.0 lb

## 2015-01-08 DIAGNOSIS — N644 Mastodynia: Secondary | ICD-10-CM | POA: Insufficient documentation

## 2015-01-08 NOTE — Telephone Encounter (Signed)
Orders placed at breast center, they will contact pt to scheduled.

## 2015-01-08 NOTE — Progress Notes (Signed)
Patient ID: [name], female   DOB: [birth year], 80 y.o.   MRN: [id number] Presents with complaint of left outer quadrant breast discomfort, intermittent nagging, aching-type pain that has persisted for months. First noticed it last year somewhat resolved but it is has returned. Normal mammogram 06/2014. No change in physical exam. Has numerous cardiac/health problems. Traumatic fall last month with no fractures, denies any injury to breast. Negative CT scan of head. Denies change in routine, walking 1 mile daily.  Exam: Appears uncomfortable with exam, breast exam and sitting and lying position without retractions, dimpling or palpable nodules or nipple discharge.  Left  mastodynia   Plan: Will schedule diagnostic mammogram. Reviewed most likely benign. Vitamin E may help, avoid caffeinated beverages.

## 2015-01-08 NOTE — Telephone Encounter (Signed)
-----   Message from Huel Cote, NP sent at 01/08/2015 10:21 AM EST ----- Please schedule diag mammogram for left outer aspect nagging intermettent breast pain for  Months. No change in exam.  Only day cant go March 2.

## 2015-01-08 NOTE — Patient Instructions (Signed)

## 2015-01-15 ENCOUNTER — Telehealth: Payer: Self-pay | Admitting: Cardiovascular Disease

## 2015-01-15 ENCOUNTER — Other Ambulatory Visit: Payer: Self-pay | Admitting: Women's Health

## 2015-01-15 DIAGNOSIS — N644 Mastodynia: Secondary | ICD-10-CM

## 2015-01-15 NOTE — Telephone Encounter (Signed)
New problem   Pt want to know the results of her monitor she had on. Please call pt.

## 2015-01-15 NOTE — Telephone Encounter (Signed)
Informed the pt that her event monitor results are not finalized yet, but Dr Burt Knack and nurse will be in the office this Friday 3/4.  Informed the pt that the monitor results are printed off and laying on Dr [employer] cart for review and interpretation.  Informed the pt that nothing critical has been reported to thus far from the monitor company.  Informed the pt that I will send a message to Dr Burt Knack and nurse to make them aware.  Pt verbalized understanding and gracious for all the assistance provided.

## 2015-01-20 NOTE — Telephone Encounter (Signed)
Appointment 01/27/15 @ 11:20am

## 2015-01-21 NOTE — Telephone Encounter (Signed)
Reviewed. No significant abnormalities.

## 2015-01-22 NOTE — Telephone Encounter (Signed)
I spoke with the pt and made her aware of heart monitor results.

## 2015-01-27 ENCOUNTER — Encounter: Payer: Self-pay | Admitting: Women's Health

## 2015-01-27 ENCOUNTER — Ambulatory Visit
Admission: RE | Admit: 2015-01-27 | Discharge: 2015-01-27 | Disposition: A | Discharge status: Home / Self Care | Payer: Medicare Other | Source: Ambulatory Visit | Attending: Women's Health | Admitting: Women's Health

## 2015-01-27 ENCOUNTER — Other Ambulatory Visit: Payer: Medicare Other

## 2015-01-27 DIAGNOSIS — N644 Mastodynia: Secondary | ICD-10-CM

## 2015-01-30 ENCOUNTER — Other Ambulatory Visit: Payer: Self-pay | Admitting: Internal Medicine

## 2015-01-30 NOTE — Telephone Encounter (Signed)
Done hardcopy to Cherina  

## 2015-01-30 NOTE — Telephone Encounter (Signed)
Done

## 2015-02-14 ENCOUNTER — Other Ambulatory Visit: Payer: Self-pay | Admitting: Internal Medicine

## 2015-02-20 ENCOUNTER — Ambulatory Visit (INDEPENDENT_AMBULATORY_CARE_PROVIDER_SITE_OTHER): Payer: Medicare Other | Admitting: Cardiovascular Disease

## 2015-02-20 ENCOUNTER — Encounter: Payer: Self-pay | Admitting: Cardiovascular Disease

## 2015-02-20 VITALS — BP 115/72 | HR 68 | Ht 66.0 in | Wt 157.6 lb

## 2015-02-20 DIAGNOSIS — I340 Nonrheumatic mitral (valve) insufficiency: Secondary | ICD-10-CM

## 2015-02-20 DIAGNOSIS — R0602 Shortness of breath: Secondary | ICD-10-CM

## 2015-02-20 LAB — BRAIN NATRIURETIC PEPTIDE: Pro B Natriuretic peptide (BNP): 278 pg/mL — ABNORMAL HIGH (ref 0.0–100.0)

## 2015-02-20 LAB — BASIC METABOLIC PANEL
BUN: 20 mg/dL (ref 6–23)
CHLORIDE: 103 meq/L (ref 96–112)
CO2: 28 mEq/L (ref 19–32)
Calcium: 9.4 mg/dL (ref 8.4–10.5)
Creatinine, Ser: 0.82 mg/dL (ref 0.40–1.20)
GFR: 71.78 mL/min (ref >60.00)
Glucose, Bld: 115 mg/dL — ABNORMAL HIGH (ref 70–99)
Potassium: 4.3 mEq/L (ref 3.5–5.1)
SODIUM: 140 meq/L (ref 135–145)

## 2015-02-20 NOTE — Progress Notes (Signed)
Cardiology Office Note   Date:  02/20/2015   ID:  [name], DOB 10/21/1935, MRN [id number]  PCP:  Felicia Cower, MD  Cardiologist:  Felicia Mocha, MD    No chief complaint on file.    History of Present Illness: [name] is a 80 y.o. female who presents for follow-up evaluation. She has a history of remote surgical repair of an atrial septal defect. In 2011 she presented with a type A aortic dissection and underwent emergent surgical repair. She was treated with multivessel CABG at that time as well. The patient has also been followed for atrial fibrillation, severe tricuspid regurgitation, and chronic congestive heart failure. She has been unable to tolerate warfarin because of severe recurrent epistaxis requiring ENT surgeries. She's had another episode of epistaxis off warfarin requiring packing and further treatment at [facility]   The patient is currently undergoing evaluation for vasculitis. She has an upcoming appointment with nephrology at [facility]. She actually feels quite well. She continues to have orthopedic problems with her low back and left shoulder. These are her primary limitations. She is still able to walk for about one hour per day. She has no chest pain, shortness of breath, or heart palpitations. She does complain of gait instability.   Past Medical History  Diagnosis Date  . OSA (obstructive sleep apnea)     pt denies this  . HTN (hypertension)   . Atrial fibrillation     paroxysmal; on coumadin  . CAD (coronary artery disease)     a. cath 7/11: LM 40%, mild plaque disease in CFX, LAD, and RCA;  b. 2011 CABG with S-LAD and S-OM done at time of Aortic dissection repair  . Chronic diastolic CHF (congestive heart failure)     a. 08/2011 Echo: EF 55-60%, PASP 24mmHg  . Dissection of aorta, thoracic     a. Type A; s/p repair 7/11 with aortic root repair and CABG x 2   . ASD (atrial septal defect)     a. s/p repair 1982 at [facility]  . Right heart  failure     a. 2/2 TR and RV dysfxn;  b. echo 4/12: EF 60%, mild LVH, mild AI, mild MR, mod LAE, mod RVE with mod dec. RVSF, mod RAE, mod to severe TR, PASP 58;  c. right heart cath 4/12:  RA mean 8, RV 46/1 with mean 6, PA 45/13 with mean 26, PCWP mean 14, CO 3.68, CI 2.1 (no sig pulmon HTN)  . GERD (gastroesophageal reflux disease)   . IBS (irritable bowel syndrome)   . Esophageal stricture   . Colonic polyp   . Peripheral neuropathy   . Fibromyalgia   . Anxiety   . HLD (hyperlipidemia)   . Borderline diabetes   . History of TIAs   . Normocytic anemia   . Thrombocytopenia   . Macular degeneration   . Depression   . Esophageal dysmotilities   . Cancer     squamous cell CA  . Sleep apnea     pt denies OSA  . Anemia, iron deficiency 11/21/2014  . DJD (degenerative joint disease)   . Osteoarthritis     Past Surgical History  Procedure Laterality Date  . Asd repair  1982    [facility]  . Shoulder open rotator cuff repair Right 2007  . Cardioversion  1982; 1995 X 2  . Appendectomy    . Vaginal hysterectomy  1987    A/P Repair With Cystocele and rectocele repair   .  Salpingoophorectomy Bilateral 1994  . Pelvic laparoscopy  1994  . Emergency redo median sternotomy for hemiarch repair of acute type a aortic  dissection  06/05/2010  . Nasal hemorrhage control  12/27/2011    Procedure: EPISTAXIS CONTROL;  Surgeon: Ruby Cola, MD;  Location: [facility] OR;  Service: ENT;  Laterality: N/A;  . Cataract extraction w/ intraocular lens  implant, bilateral Bilateral   . Cardiac catheterization  ~ 1971; 1982  . Esophagogastroduodenoscopy (egd) with esophageal dilation  "several times"  . Tubal ligation  1969  . Tonsillectomy and adenoidectomy  ~ 1944  . Nasal hemorrhage control  2013    "at [facility]"    Current Outpatient Prescriptions  Medication Sig Dispense Refill  . acetaminophen (TYLENOL) 325 MG tablet Take 650 mg by mouth every 6 (six) hours as needed for mild pain.    Marland Kitchen aspirin EC 81  MG tablet Take 1 tablet (81 mg total) by mouth daily. 1 tablet 0  . cholecalciferol (VITAMIN D) 1000 UNITS tablet Take 1,000 Units by mouth daily.     . clorazepate (TRANXENE) 7.5 MG tablet TAKE 1 TABLET BY MOUTH AT BEDTIME AS NEEDED SLEEP 90 tablet 1  . docusate sodium (COLACE) 50 MG capsule Take 50 mg by mouth 2 (two) times daily.    . Ferrous Sulfate (IRON) 28 MG TABS Take 1 tablet by mouth daily.    Marland Kitchen gabapentin (NEURONTIN) 300 MG capsule TAKE 2 CAPSULES BY MOUTH AT BEDTIME 180 capsule 3  . glucosamine-chondroitin 500-400 MG tablet Take 1 tablet by mouth 2 (two) times daily.    Marland Kitchen HYDROcodone-acetaminophen (NORCO/VICODIN) 5-325 MG per tablet Take 1 tablet by mouth every 6 (six) hours as needed for moderate pain.    . metoprolol tartrate (LOPRESSOR) 25 MG tablet Take 12.5 mg by mouth 2 (two) times daily.    . Multiple Vitamins-Minerals (EYE VITAMINS PO) Take 1 tablet by mouth 2 (two) times daily.     Marland Kitchen omeprazole (PRILOSEC) 20 MG capsule TAKE 1 CAPSULE BY MOUTH TWICE A DAY 30 MINUTES BEFORE MEALS 60 capsule 6  . Polyethylene Glycol 3350 (MIRALAX PO) Take 1 packet by mouth at bedtime.     . sertraline (ZOLOFT) 50 MG tablet Take 1 tablet (50 mg total) by mouth daily. 90 tablet 3  . spironolactone (ALDACTONE) 25 MG tablet Take 0.5 tablets (12.5 mg total) by mouth daily. 90 tablet 3  . TIKOSYN 250 MCG capsule TAKE ONE CAPSULE BY MOUTH EVERY 12 HOURS 180 capsule 0  . torsemide (DEMADEX) 20 MG tablet Take 2 tablets (40 mg total) by mouth daily. 90 tablet 3  . traMADol (ULTRAM) 50 MG tablet Take 1 tablet (50 mg total) by mouth every 8 (eight) hours as needed for moderate pain. 30 tablet 1  . vitamin C (ASCORBIC ACID) 500 MG tablet Take 500 mg by mouth daily.     No current facility-administered medications for this visit.    Allergies:   Amiodarone; Crestor; Ropinirole hydrochloride; Atorvastatin; Codeine; Erythromycin; Erythromycin base; Ezetimibe; Meperidine; Meperidine hcl; Morphine;  Neomycin-bacitracin zn-polymyx; and Simvastatin   Social History:  The patient  reports that she has never smoked. She has never used smokeless tobacco. She reports that she does not drink alcohol or use illicit drugs.   Family History:  The patient's family history includes ALS in her mother; Colon polyps in her sister; Diabetes in her mother; Heart attack in her brother; Heart disease in her brother; Hypertension in her mother; Lung cancer in her brother; Melanoma  in her sister; Osteoporosis in her sister; Pancreatic cancer in her sister. There is no history of Esophageal cancer, Colon cancer, Rectal cancer, or Stomach cancer.    ROS:  Please see the history of present illness.  Otherwise, review of systems is positive for occasional dizziness, snoring, constipation, and gait instability. Also positive for epistaxis.  All other systems are reviewed and negative.    PHYSICAL EXAM: VS:  BP 115/72 mmHg  Pulse 68  Ht 5\' 6"  (1.676 m)  Wt 157 lb 9.6 oz (71.487 kg)  BMI 25.45 kg/m2  SpO2 98% , BMI Body mass index is 25.45 kg/(m^2). GEN: Well nourished, well developed, in no acute distress HEENT: normal Neck: no JVD, no masses. No carotid bruits Cardiac: RRR with grade 3/6 harsh holosystolic murmur best heard in the left scapular area.               Respiratory:  clear to auscultation bilaterally, normal work of breathing GI: soft, nontender, nondistended, + BS MS: no deformity or atrophy Ext: no pretibial edema, pedal pulses 2+= bilaterally Skin: stasis dermatitis both lower extremities Neuro:  Strength and sensation are intact Psych: euthymic mood, full affect  EKG:  EKG is not ordered today.  Recent Labs: 11/20/2014: ALT 9; TSH 2.16 11/30/2014: Hemoglobin 10.7*; Magnesium 2.5; Platelets 186 12/01/2014: BUN 17; Creatinine 0.73; Potassium 4.0; Sodium 140   Lipid Panel     Component Value Date/Time   CHOL 213* 10/24/2013 0820   TRIG 84.0 10/24/2013 0820   HDL 47.60 10/24/2013 0820    CHOLHDL 4 10/24/2013 0820   VLDL 16.8 10/24/2013 0820   LDLCALC 82 09/07/2011 1051   LDLDIRECT 156.7 10/24/2013 0820      Wt Readings from Last 3 Encounters:  02/20/15 157 lb 9.6 oz (71.487 kg)  01/08/15 161 lb (73.029 kg)  12/03/14 161 lb (73.029 kg)     Cardiac Studies Reviewed: 2D Echo 1.17.2016: Study Conclusions  - Left ventricle: The cavity size was normal. Systolic function was normal. The estimated ejection fraction was in the range of 50% to 55%. Wall motion was normal; there were no regional wall motion abnormalities. Doppler parameters are consistent with a reversible restrictive pattern, indicative of decreased left ventricular diastolic compliance and/or increased left atrial pressure (grade 3 diastolic dysfunction). - Aortic valve: There was mild regurgitation. - Aorta: Prior aortic repair, mildly dilated aorta. - Mitral valve: There was severe regurgitation directed eccentrically and posteriorly. - Left atrium: The atrium was moderately to severely dilated. - Right ventricle: The cavity size was mildly dilated. Wall thickness was normal. Systolic function was mildly reduced. - Right atrium: The atrium was severely dilated. - Tricuspid valve: There was moderate regurgitation. - Pulmonary arteries: Systolic pressure was mildly increased. PA peak pressure: 43 mm Hg (S).  ASSESSMENT AND PLAN: 1.  Paroxysmal atrial fibrillation: maintaining sinus rhythm. Not able to tolerate anticoagulation secondary to severe epistaxis which has also occurred even off of anticoagulation. Recent event monitor showed no evidence of atrial fibrillation. Continue Tikosyn.   2. Chronic systolic and diastolic heart failure: NYHA Functional Class 1. Continue current management. Check BMET today to monitor pt while on torsemide and aldactone.  3. CAD without angina: Tolerating low-dose Aspirin.  4. Hx Type A aortic dissection: followed by Dr Roxy Manns. Has had serial CT  imaging.  6. Valvular heart disease: pt with severe eccentric MR and moderate TR. Fortunately has minimal symptoms. Will repeat a 2D Echo before her rtn visit in 6 months.   Current  medicines are reviewed with the patient today.  The patient does not have concerns regarding medicines.  The following changes have been made:  no change  Labs/ tests ordered today include:  No orders of the defined types were placed in this encounter.    Disposition:   FU 6 months  Signed, Felicia Mocha, MD  02/20/2015 2:31 PM    [facility] [address], [place], [place]  [address] Phone: [phone]; Fax: [phone]

## 2015-02-20 NOTE — Patient Instructions (Signed)
Your physician recommends that you have lab work today: BMP and BNP  Your physician has requested that you have an echocardiogram in 6 MONTHS. Echocardiography is a painless test that uses sound waves to create images of your heart. It provides your doctor with information about the size and shape of your heart and how well your heart's chambers and valves are working. This procedure takes approximately one hour. There are no restrictions for this procedure.  Your physician wants you to follow-up in: 6 MONTHS with Dr Burt Knack. You will receive a reminder letter in the mail two months in advance. If you don't receive a letter, please call our office to schedule the follow-up appointment.  Your physician recommends that you continue on your current medications as directed. Please refer to the Current Medication list given to you today.

## 2015-02-21 ENCOUNTER — Telehealth: Payer: Self-pay | Admitting: Internal Medicine

## 2015-02-21 NOTE — Telephone Encounter (Signed)
Patient needs refill for traMADol (ULTRAM) 50 MG tablet [[id number]. Pharmacy is [facility] on [address]. Patient would like to called in today. So she has some for the weekend

## 2015-02-25 MED ORDER — TRAMADOL HCL 50 MG PO TABS: 50.0000 mg | ORAL_TABLET | Freq: Three times a day (TID) | ORAL | Status: DC | PRN

## 2015-02-25 NOTE — Telephone Encounter (Signed)
Done hardcopy to Cherina  

## 2015-02-25 NOTE — Telephone Encounter (Signed)
Done

## 2015-03-13 ENCOUNTER — Telehealth: Payer: Self-pay | Admitting: *Deleted

## 2015-03-13 NOTE — Telephone Encounter (Signed)
Dr. Olevia Perches and Jenny Reichmann,  This patient was seen in January in [facility] and chart evaluated for ECL, of which clearance was given. She was unable to keep her appointment at [facility] due to a hospitalization for syncope. She has had follow up since that admission, most recently with Dr. Burt Knack in April. Just want to confirm she is appropriate for ECL at [facility].  Thank you, Olivia Mackie

## 2015-03-13 NOTE — Telephone Encounter (Signed)
Reviewed her record, EF 50-55%, not anticoagulated, hemodynamically stable  As per Dr Burt Knack. OK to proceed with EGD/colon. The EGD was added at pt's request, ask her is she still feels she still needs to be dilated.

## 2015-03-14 NOTE — Telephone Encounter (Signed)
Will discuss with pt at her PV on 03/17/15 if she still need to be dilated.Thank you. Gwyndolyn Saxon

## 2015-03-17 ENCOUNTER — Ambulatory Visit (AMBULATORY_SURGERY_CENTER): Payer: Self-pay | Admitting: *Deleted

## 2015-03-17 VITALS — Ht 66.0 in | Wt 161.0 lb

## 2015-03-17 DIAGNOSIS — R1314 Dysphagia, pharyngoesophageal phase: Secondary | ICD-10-CM

## 2015-03-17 DIAGNOSIS — Z86010 Personal history of colonic polyps: Secondary | ICD-10-CM

## 2015-03-17 NOTE — Progress Notes (Signed)
Patient denies any allergies to eggs or soy. Patient denies any problems with anesthesia/sedation. Patient denies any oxygen use at home and does not take any diet/weight loss medications. EMMI education assisgned to patient on colonoscopy & EGD, this was explained and instructions given to patient. Patient states she is still having trouble swallowing "beef and rice". Patient states she has the Moviprep already at home.

## 2015-03-17 NOTE — Telephone Encounter (Signed)
OK  DB

## 2015-03-21 ENCOUNTER — Other Ambulatory Visit (HOSPITAL_COMMUNITY): Payer: Self-pay | Admitting: Dermatology

## 2015-03-26 ENCOUNTER — Encounter: Payer: Medicare Other | Admitting: Internal Medicine

## 2015-04-02 ENCOUNTER — Encounter: Payer: Medicare Other | Admitting: Internal Medicine

## 2015-04-04 ENCOUNTER — Encounter: Payer: Self-pay | Admitting: Internal Medicine

## 2015-04-04 ENCOUNTER — Ambulatory Visit (AMBULATORY_SURGERY_CENTER): Payer: Medicare Other | Admitting: Internal Medicine

## 2015-04-04 VITALS — BP 125/68 | HR 69 | Temp 98.0°F | Resp 26 | Ht 66.0 in | Wt 161.0 lb

## 2015-04-04 DIAGNOSIS — Z800 Family history of malignant neoplasm of digestive organs: Secondary | ICD-10-CM | POA: Diagnosis not present

## 2015-04-04 DIAGNOSIS — R1314 Dysphagia, pharyngoesophageal phase: Secondary | ICD-10-CM

## 2015-04-04 DIAGNOSIS — D124 Benign neoplasm of descending colon: Secondary | ICD-10-CM

## 2015-04-04 DIAGNOSIS — Z86010 Personal history of colonic polyps: Secondary | ICD-10-CM

## 2015-04-04 DIAGNOSIS — R1310 Dysphagia, unspecified: Secondary | ICD-10-CM

## 2015-04-04 DIAGNOSIS — K635 Polyp of colon: Secondary | ICD-10-CM

## 2015-04-04 DIAGNOSIS — K2950 Unspecified chronic gastritis without bleeding: Secondary | ICD-10-CM

## 2015-04-04 DIAGNOSIS — R1319 Other dysphagia: Secondary | ICD-10-CM

## 2015-04-04 MED ORDER — SODIUM CHLORIDE 0.9 % IV SOLN: 500.0000 mL | INTRAVENOUS | Status: DC

## 2015-04-04 NOTE — Progress Notes (Signed)
Pt states she does occasionally get a nose bleed.

## 2015-04-04 NOTE — Progress Notes (Signed)
Called to room to assist during endoscopic procedure.  Patient ID and intended procedure confirmed with present staff. Received instructions for my participation in the procedure from the performing physician.  

## 2015-04-04 NOTE — Patient Instructions (Addendum)
YOU HAD AN ENDOSCOPIC PROCEDURE TODAY AT [facility]:   Refer to the procedure report that was given to you for any specific questions about what was found during the examination.  If the procedure report does not answer your questions, please call your gastroenterologist to clarify.  If you requested that your care partner not be given the details of your procedure findings, then the procedure report has been included in a sealed envelope for you to review at your convenience later.  YOU SHOULD EXPECT: Some feelings of bloating in the abdomen. Passage of more gas than usual.  Walking can help get rid of the air that was put into your GI tract during the procedure and reduce the bloating. If you had a lower endoscopy (such as a colonoscopy or flexible sigmoidoscopy) you may notice spotting of blood in your stool or on the toilet paper. If you underwent a bowel prep for your procedure, you may not have a normal bowel movement for a few days.  Please Note:  You might notice some irritation and congestion in your nose or some drainage.  This is from the oxygen used during your procedure.  There is no need for concern and it should clear up in a day or so.  SYMPTOMS TO REPORT IMMEDIATELY:   Following lower endoscopy (colonoscopy or flexible sigmoidoscopy):  Excessive amounts of blood in the stool  Significant tenderness or worsening of abdominal pains  Swelling of the abdomen that is new, acute  Fever of 100F or higher   Following upper endoscopy (EGD)  Vomiting of blood or coffee ground material  New chest pain or pain under the shoulder blades  Painful or persistently difficult swallowing  New shortness of breath  Fever of 100F or higher  Black, tarry-looking stools  For urgent or emergent issues, a gastroenterologist can be reached at any hour by calling [phone].   DIET:  NOTHING TO EAT OR DRINK UNTIL 12:30. 12:30 UNTIL 1;30 ONLY CLEAR LIQUIDS. AFTER 1:30 UNTIL  MORNING ONLY SOFT FOODS. RESUME YOUR REGULAR DIET IN AM.  ACTIVITY:  You should plan to take it easy for the rest of today and you should NOT DRIVE or use heavy machinery until tomorrow (because of the sedation medicines used during the test).    FOLLOW UP: Our staff will call the number listed on your records the next business day following your procedure to check on you and address any questions or concerns that you may have regarding the information given to you following your procedure. If we do not reach you, we will leave a message.  However, if you are feeling well and you are not experiencing any problems, there is no need to return our call.  We will assume that you have returned to your regular daily activities without incident.  If any biopsies were taken you will be contacted by phone or by letter within the next 1-3 weeks.  Please call us at [phone] if you have not heard about the biopsies in 3 weeks.    SIGNATURES/CONFIDENTIALITY: You and/or your care partner have signed paperwork which will be entered into your electronic medical record.  These signatures attest to the fact that that the information above on your After Visit Summary has been reviewed and is understood.  Full responsibility of the confidentiality of this discharge information lies with you and/or your care-partner.  Please review polyp, diverticulosis, stricture, gastritis, and high fiber diet handouts provided.

## 2015-04-04 NOTE — Op Note (Signed)
[place]  [address]. [place], [address]   COLONOSCOPY PROCEDURE REPORT  PATIENT: [name], [name]  MR#: [id number] BIRTHDATE: 1935-05-16 , 79  yrs. old GENDER: female ENDOSCOPIST: Lafayette Dragon, MD REFERRED EU:MPNTI [name], M.D. PROCEDURE DATE:  04/04/2015 PROCEDURE:   Colonoscopy, screening and Colonoscopy with cold biopsy polypectomy First Screening Colonoscopy - Avg.  risk and is 50 yrs.  old or older - No.  Prior Negative Screening - Now for repeat screening. N/A  History of Adenoma - Now for follow-up colonoscopy & has been > or = to 3 yrs.  Yes hx of adenoma.  Has been 3 or more years since last colonoscopy.  Polyps removed today? Yes ASA CLASS:   Class III INDICATIONS:Screening for colonic neoplasia, FH Colon or Rectal Adenocarcinoma, and PH Colon Adenoma. MEDICATIONS: Monitored anesthesia care and Propofol 90 mg IV  DESCRIPTION OF PROCEDURE:   After the risks benefits and alternatives of the procedure were thoroughly explained, informed consent was obtained.  The digital rectal exam revealed no abnormalities of the rectum.   The [id number]  endoscope was introduced through the anus and advanced to the cecum, which was identified by both the appendix and ileocecal valve. No adverse events experienced.   The quality of the prep was good.  (MoviPrep was used)  The instrument was then slowly withdrawn as the colon was fully examined. Estimated blood loss is zero unless otherwise noted in this procedure report.      COLON FINDINGS: A firm sessile polyp measuring 5 mm in size was found in the descending colon.  A polypectomy was performed with cold forceps.  The resection was complete, the polyp tissue was completely retrieved and sent to histology.  Retroflexed views revealed no abnormalities. The time to cecum = 12.46 Withdrawal time = 7.45   The scope was withdrawn and the procedure completed. COMPLICATIONS: There were no immediate  complications.  ENDOSCOPIC IMPRESSION: Sessile polyp was found in the descending colon; polypectomy was performed with cold forceps  RECOMMENDATIONS: High-fiber diet Await pathology report No recall colonoscopy due to age  eSigned:  Lafayette Dragon, MD 04/04/2015 11:46 AM   cc:   PATIENT NAME:  [name], [name] MR#: [id number]

## 2015-04-04 NOTE — Progress Notes (Signed)
trsnsferred to PACU.  Drowsy, but arousable.  NAD.  Report to Santiago Glad, Therapist, sports.

## 2015-04-04 NOTE — Op Note (Signed)
[place]  [employer] [place], [id number]   ENDOSCOPY PROCEDURE REPORT  PATIENT: Felicia, [name]  MR#: [id number] BIRTHDATE: 09-Apr-1935 , 79  yrs. old GENDER: female ENDOSCOPIST: Lafayette Dragon, MD REFERRED BY:  Cathlean Cower, M.D. PROCEDURE DATE:  04/04/2015 PROCEDURE:  EGD w/ biopsy ASA CLASS:     Class III INDICATIONS: solid food  dysphagia and Abnormal barium esophagram consistent with decreased motility.  Prior endoscopy in March 2011. Patient dilated with 54 French Maloney dilator. MEDICATIONS: Monitored anesthesia care and Propofol 140 mg IV TOPICAL ANESTHETIC: none  DESCRIPTION OF PROCEDURE: After the risks benefits and alternatives of the procedure were thoroughly explained, informed consent was obtained.  The LB [id number] endoscope was introduced through the mouth and advanced to the second portion of the duodenum , Without limitations.  The instrument was slowly withdrawn as the mucosa was fully examined.    Esophagus: proximal and mid esophageal mucosa was normal. The was mild concentric narrowing in the distal esophagus which allowed the endoscope to traverse into the stomach it was consistent with mild stricture Stomach: there was scattered fundic gland polyps. Mild erythema of the gastric antrum , biopsy taken to rule out H. pylori[   ,pyloric outlet was normal. Retroflexion of the endoscope revealed normal fundus and cardia Duodenum: duodenal bulb and descending duodenum was normal  Savary dilators passed guidewire starting with 14, 15 and 16 mm. There was no blood on the last dilator      The scope was then withdrawn from the patient and the procedure completed.  COMPLICATIONS: There were no immediate complications.  ENDOSCOPIC IMPRESSION: 1. mild distal esophageal stricture 2. Relation with Savary dilators 14, 15 and 16 mm 3. Fundic gland polyps 4. Mild antral gastritis status post biopsies  RECOMMENDATIONS: 1.  Await  pathology results 2.  Anti-reflux regimen to be follow  REPEAT EXAM: for EGD pending biopsy results.  eSigned:  Lafayette Dragon, MD 04/04/2015 11:41 AM    CC:  PATIENT NAME:  Felicia, [name] MR#: [id number]

## 2015-04-05 ENCOUNTER — Other Ambulatory Visit: Payer: Self-pay | Admitting: Internal Medicine

## 2015-04-07 ENCOUNTER — Telehealth: Payer: Self-pay

## 2015-04-07 NOTE — Telephone Encounter (Signed)
  Follow up Call-  Call back number 04/04/2015  Post procedure Call Back phone  # [phone]  Permission to leave phone message Yes     Patient questions:  Do you have a fever, pain , or abdominal swelling? No. Pain Score  0 *  Have you tolerated food without any problems? Yes.    Have you been able to return to your normal activities? Yes.    Do you have any questions about your discharge instructions: Diet   No. Medications  No. Follow up visit  No.  Do you have questions or concerns about your Care? No.  Actions: * If pain score is 4 or above: No action needed, pain <4.

## 2015-04-08 ENCOUNTER — Other Ambulatory Visit: Payer: Self-pay | Admitting: Internal Medicine

## 2015-04-12 ENCOUNTER — Encounter: Payer: Self-pay | Admitting: Internal Medicine

## 2015-04-17 ENCOUNTER — Other Ambulatory Visit: Payer: Self-pay | Admitting: *Deleted

## 2015-04-17 DIAGNOSIS — I7100 Dissection of unspecified site of aorta: Secondary | ICD-10-CM

## 2015-04-18 ENCOUNTER — Other Ambulatory Visit: Payer: Self-pay | Admitting: *Deleted

## 2015-04-18 DIAGNOSIS — I7100 Dissection of unspecified site of aorta: Secondary | ICD-10-CM

## 2015-05-06 ENCOUNTER — Other Ambulatory Visit: Payer: Self-pay | Admitting: Internal Medicine

## 2015-05-13 LAB — CREATININE, SERUM: CREATININE: 1.02 mg/dL (ref 0.50–1.10)

## 2015-05-13 LAB — BUN: BUN: 24 mg/dL — ABNORMAL HIGH (ref 6–23)

## 2015-05-14 ENCOUNTER — Ambulatory Visit: Payer: Medicare Other | Admitting: Thoracic Surgery (Cardiothoracic Vascular Surgery)

## 2015-05-14 ENCOUNTER — Ambulatory Visit
Admission: RE | Admit: 2015-05-14 | Discharge: 2015-05-14 | Disposition: A | Discharge status: Home / Self Care | Payer: Medicare Other | Source: Ambulatory Visit | Attending: Thoracic Surgery (Cardiothoracic Vascular Surgery) | Admitting: Thoracic Surgery (Cardiothoracic Vascular Surgery)

## 2015-05-14 DIAGNOSIS — I7100 Dissection of unspecified site of aorta: Secondary | ICD-10-CM

## 2015-05-14 MED ORDER — IOPAMIDOL (ISOVUE-370) INJECTION 76%
75.0000 mL | Freq: Once | INTRAVENOUS | Status: AC | PRN
  Administered 2015-05-14: 75 mL via INTRAVENOUS

## 2015-05-15 ENCOUNTER — Encounter: Payer: Self-pay | Admitting: Thoracic Surgery (Cardiothoracic Vascular Surgery)

## 2015-05-15 ENCOUNTER — Ambulatory Visit (INDEPENDENT_AMBULATORY_CARE_PROVIDER_SITE_OTHER): Payer: Medicare Other | Admitting: Thoracic Surgery (Cardiothoracic Vascular Surgery)

## 2015-05-15 VITALS — BP 123/72 | HR 62 | Resp 20 | Ht 66.0 in | Wt 157.0 lb

## 2015-05-15 DIAGNOSIS — I7101 Dissection of thoracic aorta: Secondary | ICD-10-CM

## 2015-05-15 DIAGNOSIS — I71019 Dissection of thoracic aorta, unspecified: Secondary | ICD-10-CM

## 2015-05-15 NOTE — Progress Notes (Signed)
[address]       [place] [address]             [phone]     CARDIOTHORACIC SURGERY OFFICE NOTE  Referring Provider is Sherren Mocha, MD PCP is Cathlean Cower, MD   HPI:  Patient returns for routine followup and surveillance nearly 5 years following emergency repair of acute type A aortic dissection. She was last seen here in the office on 04/29/2014. She is followed chronically by Dr. Burt Knack for multivessel coronary artery disease, atrial fibrillation, mitral regurgitation, tricuspid regurgitation, and chronic diastolic congestive heart failure. She has been maintaining sinus rhythm on tikosyn therapy.  She eventually had to be taken off of anticoagulation using warfarin because of severe recurrent epistaxis. She is now anticoagulated using only aspirin. She was last seen in follow-up by Dr. Burt Knack on 02/20/2015 and she returns to our office for routine follow-up today. The patient continues to remain remarkably stable from a cardiovascular standpoint. She remains active physically, walking up to 4 or 5 miles every week. She is limited primarily by severe degenerative arthritis in her back, right hip, and left shoulder. She denies symptoms of exertional shortness of breath. She denies any pain in her chest or back that could be related to her chronic aortic dissection.   Current Outpatient Prescriptions  Medication Sig Dispense Refill  . acetaminophen (TYLENOL) 325 MG tablet Take 650 mg by mouth every 6 (six) hours as needed for mild pain.    Marland Kitchen aspirin EC 81 MG tablet Take 1 tablet (81 mg total) by mouth daily. 1 tablet 0  . cholecalciferol (VITAMIN D) 1000 UNITS tablet Take 1,000 Units by mouth daily.     . clorazepate (TRANXENE) 7.5 MG tablet TAKE 1 TABLET BY MOUTH AT BEDTIME AS NEEDED SLEEP 90 tablet 1  . docusate sodium (COLACE) 50 MG capsule Take 50 mg by mouth 2 (two) times daily.    . Ferrous Sulfate (IRON) 28 MG TABS Take 1 tablet by mouth daily.    Marland Kitchen  gabapentin (NEURONTIN) 300 MG capsule TAKE 2 CAPSULES BY MOUTH AT BEDTIME 180 capsule 3  . glucosamine-chondroitin 500-400 MG tablet Take 1 tablet by mouth 2 (two) times daily.    Marland Kitchen HYDROcodone-acetaminophen (NORCO/VICODIN) 5-325 MG per tablet Take 1 tablet by mouth every 6 (six) hours as needed for moderate pain.    . metoprolol tartrate (LOPRESSOR) 25 MG tablet Take 12.5 mg by mouth 2 (two) times daily.    . Multiple Vitamins-Minerals (EYE VITAMINS PO) Take 1 tablet by mouth 2 (two) times daily.     Marland Kitchen omeprazole (PRILOSEC) 20 MG capsule Take 20 mg by mouth daily.    . Polyethylene Glycol 3350 (MIRALAX PO) Take 1 packet by mouth as needed.     . sertraline (ZOLOFT) 50 MG tablet Take 1 tablet (50 mg total) by mouth daily. 90 tablet 3  . spironolactone (ALDACTONE) 25 MG tablet Take 0.5 tablets (12.5 mg total) by mouth daily. 90 tablet 3  . TIKOSYN 250 MCG capsule TAKE ONE CAPSULE BY MOUTH EVERY 12 HOURS 180 capsule 0  . torsemide (DEMADEX) 20 MG tablet Take 2 tablets (40 mg total) by mouth daily. 90 tablet 3  . traMADol (ULTRAM) 50 MG tablet Take 1 tablet (50 mg total) by mouth every 8 (eight) hours as needed for moderate pain. 30 tablet 1  . vitamin C (ASCORBIC ACID) 500 MG tablet Take 500 mg by mouth daily.  No current facility-administered medications for this visit.      Physical Exam:   BP 123/72 mmHg  Pulse 62  Resp 20  Ht 5\' 6"  (1.676 m)  Wt 157 lb (71.215 kg)  BMI 25.35 kg/m2  SpO2 94%  General:  Well-appearing  Chest:   clear  CV:   Regular rate and rhythm with holosystolic murmur  Incisions:  Completely healed  Abdomen:  Soft and nontender  Extremities:  Warm and well-perfused  Diagnostic Tests:  CT ANGIOGRAPHY CHEST WITH CONTRAST  TECHNIQUE: Multidetector CT imaging of the chest was performed using the standard protocol during bolus administration of intravenous contrast. Multiplanar CT image reconstructions and MIPs were obtained to evaluate the vascular  anatomy.  CONTRAST: 75 mL Isovue 370 administered intravenously  COMPARISON: Most recent prior chest CT 04/29/2014  FINDINGS: Mediastinum: Unremarkable CT appearance of the thyroid gland. No suspicious mediastinal or hilar adenopathy. No soft tissue mediastinal mass. The thoracic esophagus is unremarkable.  Heart/Vascular: Postsurgical changes of repair of the ascending thoracic aorta with tube graft placement. The aortic root remains within normal limits in caliber. Cuff about the distal anastomosis has a similar configuration compared to prior. 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 The dissection flap extends into the subclavian artery where it then terminates. This is an improvement compared to prior were the dissection flap continued into the axillary artery. There is some residual eccentric wall thickening in the axillary artery at the site of the prior dissection. Stable small out pouching along the superior aspect of the axillary artery measuring 7 x 8 mm. Surgical clips are present at this location. This likely represents a site of prior surgical repair or cannulation. No evidence of extension of the dissection flap into the aorta or up the carotid artery. Left common carotid and left subclavian arteries remain tortuous but otherwise within normal limits. The transverse and descending thoracic aorta remain within normal caliber.  Cardiomegaly with marked left atrial enlargement. Atherosclerotic calcifications are present throughout the coronary arteries. No pericardial effusion. Enlarged main and central pulmonary arteries. The pulmonary trunk measures 4 cm in diameter suggesting pulmonary arterial hypertension.  Lungs/Pleura: Continued stability of the 6 mm nodule in the right lower lobe dating back to at least 2014. Additional scattered pulmonary nodules  also remain unchanged in size. Two year stability is consistent with benignity.  Bones/Soft Tissues: Left T2-T3 3.3 x 2.2 cm low-attenuation lesion extruding through the neural foramen remains essentially unchanged over multiple prior studies dating back to at least 2011. 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 No acute fracture or aggressive appearing lytic or blastic osseous lesion.  Upper Abdomen: Visualized upper abdominal organs are unremarkable.  Review of the MIP images confirms the above findings.  IMPRESSION: VASCULAR  1. Improving/regressing in dissection flap in the right axillary artery. The residual dissection flap now predominantly involves the right brachiocephalic and subclavian arteries. 2. Stable appearance of tube graft repair of the ascending thoracic aorta. 3. Stable small outpouching arising from the superior aspect of the axillary artery which may represent a small residual pseudoaneurysm, or site of prior surgical intervention. 4. 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 5. Atherosclerosis including multivessel coronary artery disease. 6. Cardiomegaly with marked left atrial enlargement. 7. Enlargement of the main and central pulmonary arteries suggest pulmonary arterial hypertension. NON VASCULAR  1. Continued stability  of 3.3 cm low-attenuation lesion extruding through the left T2-T3 neural foramen. 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 2. Continue stability of multiple small scattered pulmonary nodules which likely reflect the sequelae of a remote infectious/inflammatory or granulomatous process. 3. No acute or new abnormality.  Signed,  Criselda Peaches, MD  [facility]  [facility]   Electronically Signed  By: Jacqulynn Cadet M.D.  On:  05/14/2015 15:21   Transthoracic Echocardiography  Patient:  [place], Pereida MR #:    [id number] Study Date: 12/01/2014 Gender:   F Age:    42 Height:   167.6 cm Weight:   70.8 kg BSA:    1.83 m^2 Pt. Status: Room:    Florence, Shanker M ADMITTING  Gherghe, Hastings, Costin M REFERRING  Dresser, Costin M PERFORMING  [facility], Inpatient SONOGRAPHER Darlina Sicilian, RDCS  cc:  ------------------------------------------------------------------- LV EF: 50% -  55%  ------------------------------------------------------------------- Indications:   Syncope 780.2.  ------------------------------------------------------------------- History:  PMH: Descending Aortic Aneurysm. Anemia. GERD. Atrial fibrillation. Coronary artery disease. Congestive heart failure. PMH: ASD Repair 1982. Risk factors: Hypertension. Dyslipidemia.  ------------------------------------------------------------------- Study Conclusions  - Left ventricle: The cavity size was normal. Systolic function was normal. The estimated ejection fraction was in the range of 50% to 55%. Wall motion was normal; there were no regional wall motion abnormalities. Doppler parameters are consistent with a reversible restrictive pattern, indicative of decreased left ventricular diastolic compliance and/or increased left atrial pressure (grade 3 diastolic dysfunction). - Aortic valve: There was mild regurgitation. - Aorta: Prior aortic repair, mildly dilated aorta. - Mitral valve: There was severe regurgitation directed eccentrically and posteriorly. - Left atrium: The atrium was moderately to severely dilated. - Right ventricle: The cavity size was mildly dilated. Wall thickness was normal. Systolic function was mildly reduced. - Right atrium: The atrium was severely dilated. - Tricuspid valve: There was moderate  regurgitation. - Pulmonary arteries: Systolic pressure was mildly increased. PA peak pressure: 43 mm Hg (S).  Transthoracic echocardiography. M-mode, complete 2D, spectral Doppler, and color Doppler. Birthdate: Patient birthdate: 04-Jun-1935. Age: Patient is 80 yr old. Sex: Gender: female. BMI: 25.2 kg/m^2. Blood pressure:   129/67 Patient status: Inpatient. Study date: Study date: 12/01/2014. Study time: 09:54 AM. Location: Bedside.  -------------------------------------------------------------------  ------------------------------------------------------------------- Left ventricle: The cavity size was normal. Systolic function was normal. The estimated ejection fraction was in the range of 50% to 55%. Wall motion was normal; there were no regional wall motion abnormalities. Doppler parameters are consistent with a reversible restrictive pattern, indicative of decreased left ventricular diastolic compliance and/or increased left atrial pressure (grade 3 diastolic dysfunction).  ------------------------------------------------------------------- Aortic valve:  Structurally normal valve.  Cusp separation was normal. Doppler: Transvalvular velocity was within the normal range. There was no stenosis. There was mild regurgitation.  ------------------------------------------------------------------- Aorta: Prior aortic repair, mildly dilated aorta.  ------------------------------------------------------------------- Mitral valve:  Structurally normal valve.  Leaflet separation was normal. Doppler: Transvalvular velocity was within the normal range. There was no evidence for stenosis. There was severe regurgitation directed eccentrically and posteriorly.  Peak gradient (D): 8 mm Hg.  ------------------------------------------------------------------- Left atrium: The atrium was moderately to severely  dilated.  ------------------------------------------------------------------- Right ventricle: The cavity size was mildly dilated. Wall thickness was normal. Systolic function was mildly reduced.  ------------------------------------------------------------------- Pulmonic valve:  The valve appears to be grossly normal.  Cusp separation was normal. Doppler: Transvalvular velocity was within the  normal range. There was mild regurgitation.  ------------------------------------------------------------------- Tricuspid valve:  Structurally normal valve.  Leaflet separation was normal. Doppler: Transvalvular velocity was within the normal range. There was moderate regurgitation.  ------------------------------------------------------------------- Pulmonary artery:  Systolic pressure was mildly increased.  ------------------------------------------------------------------- Right atrium: The atrium was severely dilated.  ------------------------------------------------------------------- Pericardium: There was no pericardial effusion.  ------------------------------------------------------------------- Post procedure conclusions Ascending Aorta:  - Prior aortic repair, mildly dilated aorta.  ------------------------------------------------------------------- Measurements  Left ventricle              Value    Reference LV ID, ED, PLAX chordal     (H)   52.1 mm   43 - 52 LV ID, ES, PLAX chordal         36.6 mm   23 - 38 LV fx shortening, PLAX chordal      30  %   >=29 LV PW thickness, ED           10.4 mm   --------- IVS/LV PW ratio, ED           1.02     <=1.3 LV e&', lateral              12.6 cm/s  --------- LV E/e&', lateral             11.11    --------- LV e&', medial              7.94 cm/s  --------- LV E/e&', medial              17.63    --------- LV e&', average              10.27 cm/s  --------- LV E/e&', average             13.63    ---------  Ventricular septum            Value    Reference IVS thickness, ED            10.6 mm   ---------  LVOT                   Value    Reference LVOT ID, S                21  mm   --------- LVOT area                3.46 cm^2  ---------  Aortic valve               Value    Reference Aortic regurg pressure half-time     479  ms   ---------  Aorta                  Value    Reference Aortic root ID, ED            39  mm   ---------  Left atrium               Value    Reference LA ID, A-P, ES              54  mm   --------- LA ID/bsa, A-P          (H)   2.96 cm/m^2 <=2.2 LA volume, S               119  ml   --------- LA volume/bsa, S  65.2 ml/m^2 --------- LA volume, ES, 1-p A4C          98.2 ml   --------- LA volume/bsa, ES, 1-p A4C        53.8 ml/m^2 --------- LA volume, ES, 1-p A2C          138  ml   --------- LA volume/bsa, ES, 1-p A2C        75.6 ml/m^2 ---------  Mitral valve               Value    Reference Mitral E-wave peak velocity       140  cm/s  --------- Mitral A-wave peak velocity       57.5 cm/s  --------- Mitral deceleration time     (L)   144  ms   150 - 230 Mitral peak gradient, D         8   mm Hg --------- Mitral E/A ratio, peak          2.4     ---------  Pulmonary arteries            Value    Reference PA pressure, S, DP        (H)   43  mm Hg <=30  Tricuspid valve             Value     Reference Tricuspid regurg peak velocity      318  cm/s  --------- Tricuspid peak RV-RA gradient      40  mm Hg ---------  Systemic veins              Value    Reference Estimated CVP              3   mm Hg ---------  Right ventricle             Value    Reference RV pressure, S, DP        (H)   43  mm Hg <=30 RV s&', lateral, S            9.39 cm/s  ---------  Pulmonic valve              Value    Reference Pulmonic regurg velocity, ED       124  cm/s  --------- Pulmonic regurg gradient, ED       6   mm Hg ---------  Legend: (L) and (H) mark values outside specified reference range.  ------------------------------------------------------------------- Prepared and Electronically Authenticated by  Ezzard Standing, MD [facility] 2016-01-17T12:42:59        Impression:  The patient remains clinically stable 5 years status post emergency repair of acute type A aortic dissection.  She has residual chronic dissection involving the innominate artery above the level of the hemi-arch repair of the ascending aorta that has remained stable on serial follow-up CT angiograms. Follow-up CT angiogram performed yesterday confirms the presence of continued stability with no sign of gradual aneurysmal enlargement over time.    Plan:  The patient will return in 1 year for follow-up CT angiogram.  I spent in excess of 15 minutes during the conduct of this office consultation and >50% of this time involved direct face-to-face encounter with the patient for counseling and/or coordination of their care.    Valentina Gu. Roxy Manns, MD 05/15/2015 11:28 AM

## 2015-05-20 ENCOUNTER — Ambulatory Visit (INDEPENDENT_AMBULATORY_CARE_PROVIDER_SITE_OTHER): Payer: Medicare Other | Admitting: Internal Medicine

## 2015-05-20 ENCOUNTER — Encounter: Payer: Self-pay | Admitting: Internal Medicine

## 2015-05-20 ENCOUNTER — Other Ambulatory Visit (INDEPENDENT_AMBULATORY_CARE_PROVIDER_SITE_OTHER): Payer: Medicare Other

## 2015-05-20 VITALS — BP 106/58 | HR 69 | Temp 97.9°F | Ht 67.0 in | Wt 159.0 lb

## 2015-05-20 DIAGNOSIS — R739 Hyperglycemia, unspecified: Secondary | ICD-10-CM | POA: Diagnosis not present

## 2015-05-20 DIAGNOSIS — I10 Essential (primary) hypertension: Secondary | ICD-10-CM | POA: Diagnosis not present

## 2015-05-20 DIAGNOSIS — E7800 Pure hypercholesterolemia, unspecified: Secondary | ICD-10-CM

## 2015-05-20 DIAGNOSIS — D509 Iron deficiency anemia, unspecified: Secondary | ICD-10-CM | POA: Diagnosis not present

## 2015-05-20 LAB — BASIC METABOLIC PANEL
BUN: 24 mg/dL — AB (ref 6–23)
CALCIUM: 9.5 mg/dL (ref 8.4–10.5)
CO2: 33 mEq/L — ABNORMAL HIGH (ref 19–32)
CREATININE: 0.82 mg/dL (ref 0.40–1.20)
Chloride: 100 mEq/L (ref 96–112)
GFR: 71.73 mL/min (ref >60.00)
GLUCOSE: 97 mg/dL (ref 70–99)
Potassium: 4.1 mEq/L (ref 3.5–5.1)
Sodium: 140 mEq/L (ref 135–145)

## 2015-05-20 LAB — URINALYSIS, ROUTINE W REFLEX MICROSCOPIC
BILIRUBIN URINE: NEGATIVE
Hgb urine dipstick: NEGATIVE
KETONES UR: NEGATIVE
Leukocytes, UA: NEGATIVE
Nitrite: NEGATIVE
RBC / HPF: NONE SEEN (ref 0–?)
Specific Gravity, Urine: 1.01 (ref 1.000–1.030)
TOTAL PROTEIN, URINE-UPE24: NEGATIVE
Urine Glucose: NEGATIVE
Urobilinogen, UA: 0.2 (ref 0.0–1.0)
pH: 5.5 (ref 5.0–8.0)

## 2015-05-20 LAB — HEPATIC FUNCTION PANEL
ALK PHOS: 61 U/L (ref 39–117)
ALT: 8 U/L (ref 0–35)
AST: 15 U/L (ref 0–37)
Albumin: 4.4 g/dL (ref 3.5–5.2)
Bilirubin, Direct: 0.1 mg/dL (ref 0.0–0.3)
Total Bilirubin: 0.5 mg/dL (ref 0.2–1.2)
Total Protein: 8 g/dL (ref 6.0–8.3)

## 2015-05-20 LAB — CBC WITH DIFFERENTIAL/PLATELET
BASOS ABS: 0 10*3/uL (ref 0.0–0.1)
BASOS PCT: 0.4 % (ref 0.0–3.0)
EOS ABS: 0.1 10*3/uL (ref 0.0–0.7)
Eosinophils Relative: 1.1 % (ref 0.0–5.0)
HEMATOCRIT: 37.6 % (ref 36.0–46.0)
Hemoglobin: 12.4 g/dL (ref 12.0–15.0)
LYMPHS PCT: 24.2 % (ref 12.0–46.0)
Lymphs Abs: 1.4 10*3/uL (ref 0.7–4.0)
MCHC: 33.1 g/dL (ref 30.0–36.0)
MCV: 97.1 fl (ref 78.0–100.0)
MONO ABS: 0.4 10*3/uL (ref 0.1–1.0)
Monocytes Relative: 7.2 % (ref 3.0–12.0)
NEUTROS ABS: 3.8 10*3/uL (ref 1.4–7.7)
NEUTROS PCT: 67.1 % (ref 43.0–77.0)
Platelets: 197 10*3/uL (ref 150.0–400.0)
RBC: 3.87 Mil/uL (ref 3.87–5.11)
RDW: 13.6 % (ref 11.5–15.5)
WBC: 5.6 10*3/uL (ref 4.0–10.5)

## 2015-05-20 LAB — LIPID PANEL
CHOLESTEROL: 210 mg/dL — AB (ref 0–200)
HDL: 42.4 mg/dL (ref >39.00)
LDL CALC: 130 mg/dL — AB (ref 0–99)
NonHDL: 167.6
TRIGLYCERIDES: 187 mg/dL — AB (ref 0.0–149.0)
Total CHOL/HDL Ratio: 5
VLDL: 37.4 mg/dL (ref 0.0–40.0)

## 2015-05-20 LAB — TSH: TSH: 5.45 u[IU]/mL — ABNORMAL HIGH (ref 0.35–4.50)

## 2015-05-20 LAB — IBC PANEL
IRON: 45 ug/dL (ref 42–145)
SATURATION RATIOS: 14.8 % — AB (ref 20.0–50.0)
TRANSFERRIN: 217 mg/dL (ref 212.0–360.0)

## 2015-05-20 LAB — HEMOGLOBIN A1C: Hgb A1c MFr Bld: 5.6 % (ref 4.6–6.5)

## 2015-05-20 NOTE — Progress Notes (Signed)
Subjective:    Patient ID: [name], female    DOB: 26-May-1935, 80 y.o.   MRN: [id number]  HPI   Here for yearly f/u;  Overall doing ok;  Pt denies Chest pain, worsening SOB, DOE, wheezing, orthopnea, PND, worsening LE edema, palpitations, dizziness or syncope.  Pt denies neurological change such as new headache, facial or extremity weakness.  Pt denies polydipsia, polyuria, or low sugar symptoms. Pt states overall good compliance with treatment and medications, good tolerability, and has been trying to follow appropriate diet.  Pt denies worsening depressive symptoms, suicidal ideation or panic. No fever, night sweats, wt loss, loss of appetite, or other constitutional symptoms.  Pt states good ability with ADL's, has low fall risk, home safety reviewed and adequate, no other significant changes in hearing or vision, and only occasionally active with exercise. Has stopped taking the iron after 6 mo, no overt bleeding or bruising.   Here after staph infection sinusitis per pt at [facility]. Due for labs including lipids.  Sees pain manament at [facility], s/p ESI to lower back a few mo ago, as well as right knee cortisone, plans to return as she now has pain to right lateral hip - ? Bursitis. Past Medical History  Diagnosis Date  . OSA (obstructive sleep apnea)     pt denies this  . HTN (hypertension)   . Atrial fibrillation     paroxysmal; on coumadin  . CAD (coronary artery disease)     a. cath 7/11: LM 40%, mild plaque disease in CFX, LAD, and RCA;  b. 2011 CABG with S-LAD and S-OM done at time of Aortic dissection repair  . Chronic diastolic CHF (congestive heart failure)     a. 08/2011 Echo: EF 55-60%, PASP 31mmHg  . Dissection of aorta, thoracic     a. Type A; s/p repair 7/11 with aortic root repair and CABG x 2   . ASD (atrial septal defect)     a. s/p repair 1982 at [facility]  . Right heart failure     a. 2/2 TR and RV dysfxn;  b. echo 4/12: EF 60%, mild LVH, mild AI, mild MR, mod  LAE, mod RVE with mod dec. RVSF, mod RAE, mod to severe TR, PASP 58;  c. right heart cath 4/12:  RA mean 8, RV 46/1 with mean 6, PA 45/13 with mean 26, PCWP mean 14, CO 3.68, CI 2.1 (no sig pulmon HTN)  . GERD (gastroesophageal reflux disease)   . IBS (irritable bowel syndrome)   . Esophageal stricture   . Colonic polyp   . Peripheral neuropathy   . Fibromyalgia   . Anxiety   . HLD (hyperlipidemia)   . Borderline diabetes   . History of TIAs   . Normocytic anemia   . Thrombocytopenia   . Macular degeneration   . Depression   . Esophageal dysmotilities   . Cancer     squamous cell CA  . Sleep apnea     pt denies OSA  . Anemia, iron deficiency 11/21/2014  . DJD (degenerative joint disease)   . Osteoarthritis    Past Surgical History  Procedure Laterality Date  . Asd repair  1982    [facility]  . Shoulder open rotator cuff repair Right 2007  . Cardioversion  1982; 1995 X 2  . Appendectomy    . Vaginal hysterectomy  1987    A/P Repair With Cystocele and rectocele repair   . Salpingoophorectomy Bilateral 1994  .  Pelvic laparoscopy  1994  . Emergency redo median sternotomy for hemiarch repair of acute type a aortic  dissection  06/05/2010  . Nasal hemorrhage control  12/27/2011    Procedure: EPISTAXIS CONTROL;  Surgeon: Ruby Cola, MD;  Location: [facility] OR;  Service: ENT;  Laterality: N/A;  . Cataract extraction w/ intraocular lens  implant, bilateral Bilateral   . Cardiac catheterization  ~ 1971; 1982  . Esophagogastroduodenoscopy (egd) with esophageal dilation  "several times"  . Tubal ligation  1969  . Tonsillectomy and adenoidectomy  ~ 1944  . Nasal hemorrhage control  2013    "at [facility]"    reports that she has never smoked. She has never used smokeless tobacco. She reports that she does not drink alcohol or use illicit drugs. family history includes ALS in her mother; Colon polyps in her sister; Diabetes in her mother; Heart attack in her brother; Heart disease in her  brother; Hypertension in her mother; Lung cancer in her brother; Melanoma in her sister; Osteoporosis in her sister; Pancreatic cancer in her sister. There is no history of Esophageal cancer, Colon cancer, Rectal cancer, or Stomach cancer. Allergies  Allergen Reactions  . Amiodarone Other (See Comments)    Severe side effects per Pt--  . Crestor [Rosuvastatin] Other (See Comments)    Muscle weakness  . Ropinirole Hydrochloride Other (See Comments)    REACTION: INTOL to Requip w/ sleep paralysis  . Atorvastatin Rash    REACTION: muscle pain  . Codeine Itching    REACTION: itching  . Erythromycin Other (See Comments)    unknown  . Erythromycin Base Rash  . Ezetimibe Rash    REACTION: INTOL to Zetia w/ cough  . Meperidine Rash  . Meperidine Hcl Other (See Comments)    REACTION: dizziness  . Morphine Rash and Other (See Comments)    Pt states it "makes her crazy"  . Neomycin-Bacitracin Zn-Polymyx Rash and Other (See Comments)    blisters  . Simvastatin Rash    REACTION: unable to walk--muscle pain   Current Outpatient Prescriptions on File Prior to Visit  Medication Sig Dispense Refill  . acetaminophen (TYLENOL) 325 MG tablet Take 650 mg by mouth every 6 (six) hours as needed for mild pain.    Marland Kitchen aspirin EC 81 MG tablet Take 1 tablet (81 mg total) by mouth daily. 1 tablet 0  . cholecalciferol (VITAMIN D) 1000 UNITS tablet Take 1,000 Units by mouth daily.     . clorazepate (TRANXENE) 7.5 MG tablet TAKE 1 TABLET BY MOUTH AT BEDTIME AS NEEDED SLEEP 90 tablet 1  . docusate sodium (COLACE) 50 MG capsule Take 50 mg by mouth 2 (two) times daily.    . Ferrous Sulfate (IRON) 28 MG TABS Take 1 tablet by mouth daily.    Marland Kitchen gabapentin (NEURONTIN) 300 MG capsule TAKE 2 CAPSULES BY MOUTH AT BEDTIME 180 capsule 3  . glucosamine-chondroitin 500-400 MG tablet Take 1 tablet by mouth 2 (two) times daily.    Marland Kitchen HYDROcodone-acetaminophen (NORCO/VICODIN) 5-325 MG per tablet Take 1 tablet by mouth every 6  (six) hours as needed for moderate pain.    . metoprolol tartrate (LOPRESSOR) 25 MG tablet Take 12.5 mg by mouth 2 (two) times daily.    . Multiple Vitamins-Minerals (EYE VITAMINS PO) Take 1 tablet by mouth 2 (two) times daily.     Marland Kitchen omeprazole (PRILOSEC) 20 MG capsule Take 20 mg by mouth daily.    . Polyethylene Glycol 3350 (MIRALAX PO) Take 1 packet  by mouth as needed.     . sertraline (ZOLOFT) 50 MG tablet Take 1 tablet (50 mg total) by mouth daily. 90 tablet 3  . spironolactone (ALDACTONE) 25 MG tablet Take 0.5 tablets (12.5 mg total) by mouth daily. 90 tablet 3  . TIKOSYN 250 MCG capsule TAKE ONE CAPSULE BY MOUTH EVERY 12 HOURS 180 capsule 0  . torsemide (DEMADEX) 20 MG tablet Take 2 tablets (40 mg total) by mouth daily. 90 tablet 3  . traMADol (ULTRAM) 50 MG tablet Take 1 tablet (50 mg total) by mouth every 8 (eight) hours as needed for moderate pain. 30 tablet 1  . vitamin C (ASCORBIC ACID) 500 MG tablet Take 500 mg by mouth daily.     No current facility-administered medications on file prior to visit.    Review of Systems Constitutional: Negative for increased diaphoresis, other activity, appetite or siginficant weight change other than noted HENT: Negative for worsening hearing loss, ear pain, facial swelling, mouth sores and neck stiffness.   Eyes: Negative for other worsening pain, redness or visual disturbance.  Respiratory: Negative for shortness of breath and wheezing  Cardiovascular: Negative for chest pain and palpitations.  Gastrointestinal: Negative for diarrhea, blood in stool, abdominal distention or other pain Genitourinary: Negative for hematuria, flank pain or change in urine volume.  Musculoskeletal: Negative for myalgias or other joint complaints.  Skin: Negative for color change and wound or drainage.  Neurological: Negative for syncope and numbness. other than noted Hematological: Negative for adenopathy. or other swelling Psychiatric/Behavioral: Negative for  hallucinations, SI, self-injury, decreased concentration or other worsening agitation.      Objective:   Physical Exam BP 106/58 mmHg  Pulse 69  Temp(Src) 97.9 F (36.6 C) (Oral)  Ht 5\' 7"  (1.702 m)  Wt 159 lb (72.122 kg)  BMI 24.90 kg/m2  SpO2 97% VS noted,  Constitutional: Pt is oriented to person, place, and time. Appears well-developed and well-nourished, in no significant distress Head: Normocephalic and atraumatic.  Right Ear: External ear normal.  Left Ear: External ear normal.  Nose: Nose normal.  Mouth/Throat: Oropharynx is clear and moist.  Eyes: Conjunctivae and EOM are normal. Pupils are equal, round, and reactive to light.  Neck: Normal range of motion. Neck supple. No JVD present. No tracheal deviation present or significant neck LA or mass Cardiovascular: Normal rate, regular rhythm, distant heart sounds and intact distal pulses.   Pulmonary/Chest: Effort normal and breath sounds without rales or wheezing  Abdominal: Soft. Bowel sounds are normal. NT. No HSM  Musculoskeletal: Normal range of motion. Exhibits no edema.  Lymphadenopathy:  Has no cervical adenopathy.  Neurological: Pt is alert and oriented to person, place, and time. Pt has normal reflexes. No cranial nerve deficit. Motor grossly intact Skin: Skin is warm and dry. No rash noted.  Psychiatric:  Has normal mood and affect. Behavior is normal.     Assessment & Plan:

## 2015-05-20 NOTE — Assessment & Plan Note (Signed)
stable overall by history and exam, recent data reviewed with pt, and pt to continue medical treatment as before,  to f/u any worsening symptoms or concerns For f/u lab today Lab Results  Component Value Date   WBC 5.5 11/30/2014   HGB 10.7* 11/30/2014   HCT 34.2* 11/30/2014   MCV 90.7 11/30/2014   PLT 186 11/30/2014

## 2015-05-20 NOTE — Progress Notes (Signed)
Pre visit review using our clinic review tool, if applicable. No additional management support is needed unless otherwise documented below in the visit note. 

## 2015-05-20 NOTE — Patient Instructions (Signed)

## 2015-05-20 NOTE — Assessment & Plan Note (Signed)
stable overall by history and exam, recent data reviewed with pt, and pt to continue medical treatment as before,  to f/u any worsening symptoms or concerns Lab Results  Component Value Date   HGBA1C 6.4 11/20/2014   .for lab today

## 2015-05-20 NOTE — Assessment & Plan Note (Signed)
stable overall by history and exam, recent data reviewed with pt, and pt to continue medical treatment as before,  to f/u any worsening symptoms or concerns .[id number]

## 2015-05-23 ENCOUNTER — Other Ambulatory Visit: Payer: Self-pay | Admitting: Internal Medicine

## 2015-05-23 DIAGNOSIS — Z1231 Encounter for screening mammogram for malignant neoplasm of breast: Secondary | ICD-10-CM

## 2015-06-07 ENCOUNTER — Other Ambulatory Visit: Payer: Self-pay | Admitting: Internal Medicine

## 2015-06-09 ENCOUNTER — Other Ambulatory Visit: Payer: Self-pay

## 2015-06-09 MED ORDER — SERTRALINE HCL 50 MG PO TABS: 50.0000 mg | ORAL_TABLET | Freq: Every day | ORAL | Status: DC

## 2015-07-02 ENCOUNTER — Telehealth: Payer: Self-pay | Admitting: Cardiovascular Disease

## 2015-07-02 ENCOUNTER — Ambulatory Visit
Admission: RE | Admit: 2015-07-02 | Discharge: 2015-07-02 | Disposition: A | Discharge status: Home / Self Care | Payer: Medicare Other | Source: Ambulatory Visit | Attending: Internal Medicine | Admitting: Internal Medicine

## 2015-07-02 DIAGNOSIS — Z1231 Encounter for screening mammogram for malignant neoplasm of breast: Secondary | ICD-10-CM

## 2015-07-02 NOTE — Telephone Encounter (Signed)
I spoke with the pt's husband and the pt is currently at another appointment. I will try to reach the pt later today.

## 2015-07-02 NOTE — Telephone Encounter (Signed)
New Message  Pt calling to speak w/ Lauren about upcoming ortho surgery. Pt wanted to personally discuss the surgery w/ the RN. Please call back and discuss.

## 2015-07-02 NOTE — Telephone Encounter (Signed)
I made the pt aware that Dr Onnie Graham has sent a request to our office for cardiac clearance.  The pt is pending left shoulder: Reversed Total Shoulder. The pt wants to make sure that Dr Burt Knack feels like she can have surgery and she does not want to have anything done if this will hurt her heart. The pt said she will just live with her pain and restrictions if this is to much for her heart.  I have placed clearance request in Dr Antionette Char folder for review.

## 2015-07-03 NOTE — Telephone Encounter (Signed)
Called patient and left message to call back. I have discussed this with her in the past, including most recent office visit. She has a moderate risk of cardiac complication related to shoulder surgery under general anesthesia but has been stable and would not require further testing if she decides to proceed. Issues include risk of atrial fibrillation, ischemic risk after undergoing CABG done emergently at time of aortic dissection repair, and valvular disease with severe mitral regurgitation. Despite these issues, she has been clinically stable over the past few years. She will have to make a decision whether she wants to assume the risk of surgery. If shoulder pain can be managed non-surgically, I think that would be best.  Sherren Mocha 07/03/2015 7:13 PM

## 2015-07-07 ENCOUNTER — Other Ambulatory Visit: Payer: Self-pay | Admitting: Internal Medicine

## 2015-07-08 NOTE — Telephone Encounter (Signed)
I spoke with the pt and made her aware of Dr Antionette Char comments.  At this time the pt is going to continue to evaluate whether she would like to go through with surgery. The pt is aware that she is at moderate risk of complication with surgery.

## 2015-08-07 ENCOUNTER — Telehealth: Payer: Self-pay | Admitting: *Deleted

## 2015-08-07 DIAGNOSIS — R002 Palpitations: Secondary | ICD-10-CM

## 2015-08-07 NOTE — Telephone Encounter (Signed)
Reviewed with Dr.Cooper who would like pt to wear 48 holter. Pt with history of atrial fib.  Lauren will contact her regarding follow up appt with Dr. Burt Knack.

## 2015-08-07 NOTE — Telephone Encounter (Signed)
Pt notified and she would like to proceed with holter. Will have scheduling contact her with appt

## 2015-08-07 NOTE — Addendum Note (Signed)
Addended by: Thompson Grayer on: 08/07/2015 01:46 PM   Modules accepted: Orders

## 2015-08-07 NOTE — Telephone Encounter (Signed)
Pt calling to report she was on vacation last week and did not walk much. She resumed walking this week.  Today while walking she had to rest three times and she feels tired today. Looked in mirror and noted pounding on left side of neck this morning. Resolved with rest.  No chest pain or shortness of breath.  Last night felt "full" in chest. Not pain but felt like it did in past when she had atrial fib. Lasted off and on for 5-10 minutes.  Felt a few irregularities in her pulse during episode.  Fullness better this AM and not feeling irregularity today.  Will forward to Dr. Burt Knack for review.  Pt is due for 6 month follow up in October but does not have appt scheduled yet.

## 2015-08-10 ENCOUNTER — Other Ambulatory Visit: Payer: Self-pay | Admitting: Internal Medicine

## 2015-08-11 ENCOUNTER — Ambulatory Visit (INDEPENDENT_AMBULATORY_CARE_PROVIDER_SITE_OTHER): Payer: Medicare Other | Admitting: Internal Medicine

## 2015-08-11 DIAGNOSIS — Z23 Encounter for immunization: Secondary | ICD-10-CM

## 2015-08-11 NOTE — Progress Notes (Signed)
OV opened in error

## 2015-08-13 ENCOUNTER — Ambulatory Visit: Payer: Medicare Other

## 2015-08-13 ENCOUNTER — Ambulatory Visit (INDEPENDENT_AMBULATORY_CARE_PROVIDER_SITE_OTHER): Payer: Medicare Other

## 2015-08-13 DIAGNOSIS — R002 Palpitations: Secondary | ICD-10-CM

## 2015-08-16 ENCOUNTER — Other Ambulatory Visit: Payer: Self-pay | Admitting: Internal Medicine

## 2015-08-18 MED ORDER — CLORAZEPATE DIPOTASSIUM 7.5 MG PO TABS: ORAL_TABLET | ORAL | Status: DC

## 2015-08-18 NOTE — Telephone Encounter (Signed)
Left msg on triage requesting status on refill for her clorazepate. MD is out of the office this week. Pls advise on refill...[name]

## 2015-08-18 NOTE — Telephone Encounter (Signed)
OK #[phone],[id number] [phone]

## 2015-08-18 NOTE — Telephone Encounter (Signed)
Faxed [facility] to [facility].../lmb 

## 2015-08-27 NOTE — Telephone Encounter (Signed)
I spoke with the pt and made her aware of holter monitor results.  I arranged follow-up appointment with Dr Burt Knack on 09/08/15.

## 2015-09-08 ENCOUNTER — Ambulatory Visit: Payer: Medicare Other | Admitting: Cardiovascular Disease

## 2015-09-10 ENCOUNTER — Other Ambulatory Visit: Payer: Self-pay | Admitting: Internal Medicine

## 2015-09-10 NOTE — Telephone Encounter (Signed)
Done hardcopy to [facility]

## 2015-09-10 NOTE — Telephone Encounter (Signed)
Faxed script to [facility].../lmb 

## 2015-09-10 NOTE — Telephone Encounter (Signed)
Please advise, sent 08/18/15 # [phone]

## 2015-09-12 ENCOUNTER — Ambulatory Visit (INDEPENDENT_AMBULATORY_CARE_PROVIDER_SITE_OTHER): Payer: Medicare Other | Admitting: Cardiovascular Disease

## 2015-09-12 ENCOUNTER — Encounter: Payer: Self-pay | Admitting: Cardiovascular Disease

## 2015-09-12 VITALS — BP 108/64 | HR 73 | Ht 66.0 in | Wt 155.0 lb

## 2015-09-12 DIAGNOSIS — I340 Nonrheumatic mitral (valve) insufficiency: Secondary | ICD-10-CM

## 2015-09-12 NOTE — Patient Instructions (Signed)
Medication Instructions:  Your physician recommends that you continue on your current medications as directed. Please refer to the Current Medication list given to you today.  Labwork: No new orders.   Testing/Procedures: Your physician has requested that you have an echocardiogram in 6 MONTHS. Echocardiography is a painless test that uses sound waves to create images of your heart. It provides your doctor with information about the size and shape of your heart and how well your heart's chambers and valves are working. This procedure takes approximately one hour. There are no restrictions for this procedure.  Follow-Up: Your physician wants you to follow-up in: 6 MONTHS with Dr Cooper.  You will receive a reminder letter in the mail two months in advance. If you don't receive a letter, please call our office to schedule the follow-up appointment.   Any Other Special Instructions Will Be Listed Below (If Applicable).     If you need a refill on your cardiac medications before your next appointment, please call your pharmacy.   

## 2015-09-12 NOTE — Progress Notes (Signed)
Cardiology Office Note Date:  09/14/2015   ID:  [name], DOB [birth year], MRN [id number]  PCP:  Cathlean Cower, MD  Cardiologist:  Sherren Mocha, MD    Chief Complaint  Patient presents with  . Follow-up    mitral regurgitation/atrial fibrillation    History of Present Illness: [name] is a 80 y.o. female who presents for follow-up evaluation. She has a history of remote surgical repair of an atrial septal defect. In 2011 she presented with a type A aortic dissection and underwent emergent surgical repair. She was treated with multivessel CABG at that time as well. The patient has also been followed for atrial fibrillation, severe tricuspid regurgitation, and chronic congestive heart failure. She has been unable to tolerate warfarin because of severe recurrent epistaxis requiring ENT surgeries. She's had another episode of epistaxis off warfarin requiring packing and further treatment at [facility].   Walking on a daily basis, over one mile without rest. She denies chest pain, pressure, DOE, orthopnea, or PND. Has occasional edema. Primary problem is left shoulder pain. Has considered shoulder replacement but concerned about risks of general anesthesia.  Past Medical History  Diagnosis Date  . OSA (obstructive sleep apnea)     pt denies this  . HTN (hypertension)   . Atrial fibrillation (HCC)     paroxysmal; on coumadin  . CAD (coronary artery disease)     a. cath 7/11: LM 40%, mild plaque disease in CFX, LAD, and RCA;  b. 2011 CABG with S-LAD and S-OM done at time of Aortic dissection repair  . Chronic diastolic CHF (congestive heart failure) ([place])     a. 08/2011 Echo: EF 55-60%, PASP 57mmHg  . Dissection of aorta, thoracic ([place])     a. Type A; s/p repair 7/11 with aortic root repair and CABG x 2   . ASD (atrial septal defect)     a. s/p repair 1982 at [facility]  . Right heart failure (HCC)     a. 2/2 TR and RV dysfxn;  b. echo 4/12: EF 60%, mild LVH, mild AI, mild MR, mod  LAE, mod RVE with mod dec. RVSF, mod RAE, mod to severe TR, PASP 58;  c. right heart cath 4/12:  RA mean 8, RV 46/1 with mean 6, PA 45/13 with mean 26, PCWP mean 14, CO 3.68, CI 2.1 (no sig pulmon HTN)  . GERD (gastroesophageal reflux disease)   . IBS (irritable bowel syndrome)   . Esophageal stricture   . Colonic polyp   . Peripheral neuropathy ([place])   . Fibromyalgia   . Anxiety   . HLD (hyperlipidemia)   . Borderline diabetes   . History of TIAs   . Normocytic anemia   . Thrombocytopenia ([place])   . Macular degeneration   . Depression   . Esophageal dysmotilities   . Cancer (HCC)     squamous cell CA  . Sleep apnea     pt denies OSA  . Anemia, iron deficiency 11/21/2014  . DJD (degenerative joint disease)   . Osteoarthritis     Past Surgical History  Procedure Laterality Date  . Asd repair  1982    [facility]  . Shoulder open rotator cuff repair Right 2007  . Cardioversion  1982; 1995 X 2  . Appendectomy    . Vaginal hysterectomy  1987    A/P Repair With Cystocele and rectocele repair   . Salpingoophorectomy Bilateral 1994  . Pelvic laparoscopy  1994  . Emergency redo median  sternotomy for hemiarch repair of acute type a aortic  dissection  06/05/2010  . Nasal hemorrhage control  12/27/2011    Procedure: EPISTAXIS CONTROL;  Surgeon: Ruby Cola, MD;  Location: [facility] OR;  Service: ENT;  Laterality: N/A;  . Cataract extraction w/ intraocular lens  implant, bilateral Bilateral   . Cardiac catheterization  ~ 1971; 1982  . Esophagogastroduodenoscopy (egd) with esophageal dilation  "several times"  . Tubal ligation  1969  . Tonsillectomy and adenoidectomy  ~ 1944  . Nasal hemorrhage control  2013    "at [facility]"    Current Outpatient Prescriptions  Medication Sig Dispense Refill  . acetaminophen (TYLENOL) 325 MG tablet Take 650 mg by mouth every 6 (six) hours as needed for mild pain.    Marland Kitchen amoxicillin (AMOXIL) 500 MG capsule TAKE 4 CAPSULES BY MOUTH 1 HOUR PRIOR TO DENTAL  PROCEDURES. 4 capsule 0  . aspirin EC 81 MG tablet Take 1 tablet (81 mg total) by mouth daily. 1 tablet 0  . cholecalciferol (VITAMIN D) 1000 UNITS tablet Take 1,000 Units by mouth daily.     . clorazepate (TRANXENE) 7.5 MG tablet TAKE 1 TABLET BY MOUTH AT BEDTIME AS NEEDED FOR SLEEP 30 tablet 2  . docusate sodium (COLACE) 50 MG capsule Take 50 mg by mouth 2 (two) times daily.    Marland Kitchen gabapentin (NEURONTIN) 300 MG capsule TAKE 2 CAPSULES BY MOUTH AT BEDTIME 180 capsule 3  . glucosamine-chondroitin 500-400 MG tablet Take 1 tablet by mouth 2 (two) times daily.    Marland Kitchen HYDROcodone-acetaminophen (NORCO/VICODIN) 5-325 MG per tablet Take 1 tablet by mouth every 6 (six) hours as needed for moderate pain.    Marland Kitchen KLOR-CON M20 20 MEQ tablet TAKE 1 TABLET (20 MEQ TOTAL) BY MOUTH DAILY. 90 tablet 3  . metoprolol tartrate (LOPRESSOR) 25 MG tablet Take 12.5 mg by mouth 2 (two) times daily.    . Multiple Vitamins-Minerals (EYE VITAMINS PO) Take 1 tablet by mouth 2 (two) times daily.     Marland Kitchen omeprazole (PRILOSEC) 20 MG capsule Take 20 mg by mouth daily.    . Polyethylene Glycol 3350 (MIRALAX PO) Take 1 packet by mouth daily as needed (constipation).     . sertraline (ZOLOFT) 50 MG tablet Take 1 tablet (50 mg total) by mouth daily. 90 tablet 3  . spironolactone (ALDACTONE) 25 MG tablet Take 0.5 tablets (12.5 mg total) by mouth daily. 90 tablet 3  . TIKOSYN 250 MCG capsule TAKE ONE CAPSULE BY MOUTH EVERY 12 HOURS 180 capsule 2  . torsemide (DEMADEX) 20 MG tablet Take 2 tablets (40 mg total) by mouth daily. 90 tablet 3  . traMADol (ULTRAM) 50 MG tablet Take 1 tablet (50 mg total) by mouth every 8 (eight) hours as needed for moderate pain. 30 tablet 1  . vitamin C (ASCORBIC ACID) 500 MG tablet Take 500 mg by mouth daily.     No current facility-administered medications for this visit.    Allergies:   Amiodarone; Crestor; Ropinirole hydrochloride; Atorvastatin; Codeine; Erythromycin; Erythromycin base; Ezetimibe;  Meperidine; Meperidine hcl; Morphine; Neomycin-bacitracin zn-polymyx; and Simvastatin   Social History:  The patient  reports that she has never smoked. She has never used smokeless tobacco. She reports that she does not drink alcohol or use illicit drugs.   Family History:  The patient's  family history includes ALS in her mother; Colon polyps in her sister; Diabetes in her mother; Heart attack in her brother; Heart disease in her brother; Hypertension in her  mother; Lung cancer in her brother; Melanoma in her sister; Osteoporosis in her sister; Pancreatic cancer in her sister. There is no history of Esophageal cancer, Colon cancer, Rectal cancer, or Stomach cancer.    ROS:  Please see the history of present illness.  Otherwise, review of systems is positive for shoulder and back pain.  All other systems are reviewed and negative.    PHYSICAL EXAM: VS:  BP 108/64 mmHg  Pulse 73  Ht 5\' 6"  (1.676 m)  Wt 155 lb (70.308 kg)  BMI 25.03 kg/m2  SpO2 96% , BMI Body mass index is 25.03 kg/(m^2). GEN: Well nourished, well developed, in no acute distress HEENT: normal Neck: no JVD, no masses. No carotid bruits Cardiac: RRR with 3/6 harsh holosystolic murmur at the apex                Respiratory:  clear to auscultation bilaterally, normal work of breathing GI: soft, nontender, nondistended, + BS MS: no deformity or atrophy Ext: no pretibial edema, pedal pulses 2+= bilaterally Skin: warm and dry, no rash Neuro:  Strength and sensation are intact Psych: euthymic mood, full affect  EKG:  EKG is not ordered today.  Recent Labs: 11/30/2014: Magnesium 2.5 02/20/2015: Pro B Natriuretic peptide (BNP) 278.0* 05/20/2015: ALT 8; BUN 24*; Creatinine, Ser 0.82; Hemoglobin 12.4; Platelets 197.0; Potassium 4.1; Sodium 140; TSH 5.45*   Lipid Panel     Component Value Date/Time   CHOL 210* 05/20/2015 1504   TRIG 187.0* 05/20/2015 1504   HDL 42.40 05/20/2015 1504   CHOLHDL 5 05/20/2015 1504   VLDL 37.4  05/20/2015 1504   LDLCALC 130* 05/20/2015 1504   LDLDIRECT 156.7 10/24/2013 0820      Wt Readings from Last 3 Encounters:  09/12/15 155 lb (70.308 kg)  05/20/15 159 lb (72.122 kg)  05/15/15 157 lb (71.215 kg)     Cardiac Studies Reviewed: 2D Echo - January 2016: Study Conclusions  - Left ventricle: The cavity size was normal. Systolic function was normal. The estimated ejection fraction was in the range of 50% to 55%. Wall motion was normal; there were no regional wall motion abnormalities. Doppler parameters are consistent with a reversible restrictive pattern, indicative of decreased left ventricular diastolic compliance and/or increased left atrial pressure (grade 3 diastolic dysfunction). - Aortic valve: There was mild regurgitation. - Aorta: Prior aortic repair, mildly dilated aorta. - Mitral valve: There was severe regurgitation directed eccentrically and posteriorly. - Left atrium: The atrium was moderately to severely dilated. - Right ventricle: The cavity size was mildly dilated. Wall thickness was normal. Systolic function was mildly reduced. - Right atrium: The atrium was severely dilated. - Tricuspid valve: There was moderate regurgitation. - Pulmonary arteries: Systolic pressure was mildly increased. PA peak pressure: 43 mm Hg (S).  48 hour Holter Monitor 08/13/2015: Study Highlights    1. The basic rhythm is sinus 2. There are rare PVC's 3. There are short supraventricular runs of 3-4 beats 4. No evidence of atrial fibrillation, sustained arrhythmia, or pathologic pauses   ASSESSMENT AND PLAN: 1. Paroxysmal atrial fibrillation: maintaining sinus rhythm. Not able to tolerate anticoagulation secondary to severe epistaxis which has also occurred even off of anticoagulation. 48 hour monitor reviewed and showed sinus rhythm.   2. Chronic systolic and diastolic heart failure: NYHA Functional Class 1. Continue current management.   3. CAD  without angina: Tolerating low-dose Aspirin.  4. Hx Type A aortic dissection: followed by Dr Roxy Manns. Has had serial CT imaging.  6.  Valvular heart disease: pt with severe eccentric MR and moderate TR. Fortunately has minimal symptoms. Will repeat a 2D Echo before her rtn visit in 6 months.  Current medicines are reviewed with the patient today.  The patient does not have concerns regarding medicines.  Labs/ tests ordered today include:   Orders Placed This Encounter  Procedures  . Echocardiogram    Disposition:   FU 6 months  Signed, Sherren Mocha, MD  09/14/2015 11:39 PM    [address], [place], [place]  [address] Phone: [phone]; Fax: [phone]

## 2015-09-14 ENCOUNTER — Other Ambulatory Visit: Payer: Self-pay | Admitting: Internal Medicine

## 2015-09-15 ENCOUNTER — Other Ambulatory Visit: Payer: Self-pay | Admitting: Internal Medicine

## 2015-09-16 ENCOUNTER — Other Ambulatory Visit: Payer: Self-pay | Admitting: Internal Medicine

## 2015-09-18 ENCOUNTER — Other Ambulatory Visit: Payer: Self-pay | Admitting: Internal Medicine

## 2015-09-18 NOTE — Telephone Encounter (Signed)
Last traxene rx just done oct 16  Too soon for next

## 2015-09-19 ENCOUNTER — Other Ambulatory Visit: Payer: Self-pay | Admitting: Internal Medicine

## 2015-09-24 ENCOUNTER — Other Ambulatory Visit (HOSPITAL_COMMUNITY): Payer: Medicare Other

## 2015-10-06 LAB — HM DIABETES EYE EXAM

## 2015-10-07 ENCOUNTER — Encounter: Payer: Self-pay | Admitting: Internal Medicine

## 2015-10-20 ENCOUNTER — Other Ambulatory Visit: Payer: Self-pay | Admitting: Internal Medicine

## 2015-10-21 NOTE — Telephone Encounter (Signed)
Faxed script back to [facility].../lmb 

## 2015-10-21 NOTE — Telephone Encounter (Signed)
Done Done hardcopy to steph

## 2015-11-14 ENCOUNTER — Other Ambulatory Visit: Payer: Self-pay | Admitting: Internal Medicine

## 2015-11-17 ENCOUNTER — Other Ambulatory Visit: Payer: Self-pay | Admitting: Internal Medicine

## 2015-11-20 ENCOUNTER — Encounter: Payer: Self-pay | Admitting: Internal Medicine

## 2015-11-20 ENCOUNTER — Ambulatory Visit (INDEPENDENT_AMBULATORY_CARE_PROVIDER_SITE_OTHER): Payer: Medicare Other | Admitting: Internal Medicine

## 2015-11-20 VITALS — BP 120/74 | HR 72 | Temp 97.7°F | Ht 66.0 in | Wt 157.0 lb

## 2015-11-20 DIAGNOSIS — F341 Dysthymic disorder: Secondary | ICD-10-CM | POA: Diagnosis not present

## 2015-11-20 DIAGNOSIS — I480 Paroxysmal atrial fibrillation: Secondary | ICD-10-CM

## 2015-11-20 DIAGNOSIS — I10 Essential (primary) hypertension: Secondary | ICD-10-CM

## 2015-11-20 DIAGNOSIS — E7800 Pure hypercholesterolemia, unspecified: Secondary | ICD-10-CM | POA: Diagnosis not present

## 2015-11-20 DIAGNOSIS — D509 Iron deficiency anemia, unspecified: Secondary | ICD-10-CM

## 2015-11-20 MED ORDER — CLORAZEPATE DIPOTASSIUM 7.5 MG PO TABS: 7.5000 mg | ORAL_TABLET | Freq: Every evening | ORAL | Status: DC | PRN

## 2015-11-20 MED ORDER — AMOXICILLIN 500 MG PO CAPS: ORAL_CAPSULE | ORAL | Status: DC

## 2015-11-20 MED ORDER — HYDROCODONE-ACETAMINOPHEN 5-325 MG PO TABS: 1.0000 | ORAL_TABLET | Freq: Four times a day (QID) | ORAL | Status: DC | PRN

## 2015-11-20 NOTE — Progress Notes (Signed)
Subjective:    Patient ID: [name], female    DOB: [birth year], 81 y.o.   MRN: [id number]  HPI  Here to f/u; overall doing ok,  Pt denies chest pain, increasing sob or doe, wheezing, orthopnea, PND, increased LE swelling, palpitations, dizziness or syncope.  Pt denies new neurological symptoms such as new headache, or facial or extremity weakness or numbness.  Pt denies polydipsia, polyuria, or low sugar episode.   Pt denies new neurological symptoms such as new headache, or facial or extremity weakness or numbness.   Pt states overall good compliance with meds, mostly trying to follow appropriate diet, with wt overall stable. Did have recent nosebleed tx at [facility].  Walks 1 mile up to 5 times per wk, Pt continues to have recurring LBP without change in severity, bowel or bladder change, fever, wt loss,  worsening LE pain/numbness/weakness, gait change or falls, did have ESI last wk per DrRamos without improvement, taking tramadol up to 3 times per day if needed.   Only takes vicodin rarely. Past Medical History  Diagnosis Date  . OSA (obstructive sleep apnea)     pt denies this  . HTN (hypertension)   . Atrial fibrillation (HCC)     paroxysmal; on coumadin  . CAD (coronary artery disease)     a. cath 7/11: LM 40%, mild plaque disease in CFX, LAD, and RCA;  b. 2011 CABG with S-LAD and S-OM done at time of Aortic dissection repair  . Chronic diastolic CHF (congestive heart failure) ([place])     a. 08/2011 Echo: EF 55-60%, PASP 76mmHg  . Dissection of aorta, thoracic ([place])     a. Type A; s/p repair 7/11 with aortic root repair and CABG x 2   . ASD (atrial septal defect)     a. s/p repair [facility] at [facility]  . Right heart failure (HCC)     a. 2/2 TR and RV dysfxn;  b. echo 4/12: EF 60%, mild LVH, mild AI, mild MR, mod LAE, mod RVE with mod dec. RVSF, mod RAE, mod to severe TR, PASP 58;  c. right heart cath 4/12:  RA mean 8, RV 46/1 with mean 6, PA 45/13 with mean 26, PCWP mean 14, CO 3.68, CI 2.1  (no sig pulmon HTN)  . GERD (gastroesophageal reflux disease)   . IBS (irritable bowel syndrome)   . Esophageal stricture   . Colonic polyp   . Peripheral neuropathy ([place])   . Fibromyalgia   . Anxiety   . HLD (hyperlipidemia)   . Borderline diabetes   . History of TIAs   . Normocytic anemia   . Thrombocytopenia ([place])   . Macular degeneration   . Depression   . Esophageal dysmotilities   . Cancer (HCC)     squamous cell CA  . Sleep apnea     pt denies OSA  . Anemia, iron deficiency 11/21/2014  . DJD (degenerative joint disease)   . Osteoarthritis    Past Surgical History  Procedure Laterality Date  . Asd repair  1982    [facility]  . Shoulder open rotator cuff repair Right 2007  . Cardioversion  1982; 1995 X 2  . Appendectomy    . Vaginal hysterectomy  1987    A/P Repair With Cystocele and rectocele repair   . Salpingoophorectomy Bilateral 1994  . Pelvic laparoscopy  1994  . Emergency redo median sternotomy for hemiarch repair of acute type a aortic  dissection  06/05/2010  . Nasal hemorrhage  control  12/27/2011    Procedure: EPISTAXIS CONTROL;  Surgeon: Ruby Cola, MD;  Location: [facility] OR;  Service: ENT;  Laterality: N/A;  . Cataract extraction w/ intraocular lens  implant, bilateral Bilateral   . Cardiac catheterization  ~ 1971; 1982  . Esophagogastroduodenoscopy (egd) with esophageal dilation  "several times"  . Tubal ligation  1969  . Tonsillectomy and adenoidectomy  ~ 1944  . Nasal hemorrhage control  2013    "at [facility]"    reports that she has never smoked. She has never used smokeless tobacco. She reports that she does not drink alcohol or use illicit drugs. family history includes ALS in her mother; Colon polyps in her sister; Diabetes in her mother; Heart attack in her brother; Heart disease in her brother; Hypertension in her mother; Lung cancer in her brother; Melanoma in her sister; Osteoporosis in her sister; Pancreatic cancer in her sister. There is no history  of Esophageal cancer, Colon cancer, Rectal cancer, or Stomach cancer. Allergies  Allergen Reactions  . Amiodarone Other (See Comments)    Severe side effects per Pt--  . Crestor [Rosuvastatin] Other (See Comments)    Muscle weakness  . Ropinirole Hydrochloride Other (See Comments)    REACTION: INTOL to Requip w/ sleep paralysis  . Atorvastatin Rash    REACTION: muscle pain  . Codeine Itching    REACTION: itching  . Erythromycin Other (See Comments)    unknown  . Erythromycin Base Rash  . Ezetimibe Rash    REACTION: INTOL to Zetia w/ cough  . Meperidine Rash  . Meperidine Hcl Other (See Comments)    REACTION: dizziness  . Morphine Rash and Other (See Comments)    Pt states it "makes her crazy"  . Neomycin-Bacitracin Zn-Polymyx Rash and Other (See Comments)    blisters  . Simvastatin Rash    REACTION: unable to walk--muscle pain   Current Outpatient Prescriptions on File Prior to Visit  Medication Sig Dispense Refill  . acetaminophen (TYLENOL) 325 MG tablet Take 650 mg by mouth every 6 (six) hours as needed for mild pain.    Marland Kitchen amoxicillin (AMOXIL) 500 MG capsule TAKE 4 CAPSULES BY MOUTH 1 HOUR PRIOR TO DENTAL PROCEDURES. 4 capsule 0  . aspirin EC 81 MG tablet Take 1 tablet (81 mg total) by mouth daily. 1 tablet 0  . cholecalciferol (VITAMIN D) 1000 UNITS tablet Take 1,000 Units by mouth daily.     . clorazepate (TRANXENE) 7.5 MG tablet TAKE 1 TABLET BY MOUTH AT BEDTIME AS NEEDED FOR SLEEP 30 tablet 2  . docusate sodium (COLACE) 50 MG capsule Take 50 mg by mouth 2 (two) times daily.    Marland Kitchen gabapentin (NEURONTIN) 300 MG capsule TAKE 2 CAPSULES BY MOUTH AT BEDTIME 180 capsule 1  . glucosamine-chondroitin 500-400 MG tablet Take 1 tablet by mouth 2 (two) times daily.    Marland Kitchen HYDROcodone-acetaminophen (NORCO/VICODIN) 5-325 MG per tablet Take 1 tablet by mouth every 6 (six) hours as needed for moderate pain.    Marland Kitchen KLOR-CON M20 20 MEQ tablet TAKE 1 TABLET (20 MEQ TOTAL) BY MOUTH DAILY. 90  tablet 3  . metoprolol tartrate (LOPRESSOR) 25 MG tablet Take 12.5 mg by mouth 2 (two) times daily.    . Multiple Vitamins-Minerals (EYE VITAMINS PO) Take 1 tablet by mouth 2 (two) times daily.     Marland Kitchen omeprazole (PRILOSEC) 20 MG capsule Take 20 mg by mouth daily.    Marland Kitchen omeprazole (PRILOSEC) 20 MG capsule TAKE 1 CAPSULE BY  MOUTH TWICE A DAY 30 MINUTES BEFORE MEALS 60 capsule 5  . Polyethylene Glycol 3350 (MIRALAX PO) Take 1 packet by mouth daily as needed (constipation).     . sertraline (ZOLOFT) 50 MG tablet Take 1 tablet (50 mg total) by mouth daily. 90 tablet 3  . spironolactone (ALDACTONE) 25 MG tablet Take 0.5 tablets (12.5 mg total) by mouth daily. 90 tablet 3  . TIKOSYN 250 MCG capsule TAKE ONE CAPSULE BY MOUTH EVERY 12 HOURS 180 capsule 2  . torsemide (DEMADEX) 20 MG tablet Take 2 tablets (40 mg total) by mouth daily. 90 tablet 3  . traMADol (ULTRAM) 50 MG tablet TAKE 1 TABLET TWICE A DAY AS NEEDED 60 tablet 1  . vitamin C (ASCORBIC ACID) 500 MG tablet Take 500 mg by mouth daily.     No current facility-administered medications on file prior to visit.    Review of Systems  Constitutional: Negative for unusual diaphoresis or night sweats HENT: Negative for ringing in ear or discharge Eyes: Negative for double vision or worsening visual disturbance.  Respiratory: Negative for choking and stridor.   Gastrointestinal: Negative for vomiting or other signifcant bowel change Genitourinary: Negative for hematuria or change in urine volume.  Musculoskeletal: Negative for other MSK pain or swelling Skin: Negative for color change and worsening wound.  Neurological: Negative for tremors and numbness other than noted  Psychiatric/Behavioral: Negative for decreased concentration or agitation other than above       Objective:   Physical Exam BP 120/74 mmHg  Pulse 72  Temp(Src) 97.7 F (36.5 C) (Oral)  Ht 5\' 6"  (1.676 m)  Wt 157 lb (71.215 kg)  BMI 25.35 kg/m2  SpO2 97% VS noted,    Constitutional: Pt appears in no significant distress HENT: Head: NCAT.  Right Ear: External ear normal.  Left Ear: External ear normal.  Eyes: . Pupils are equal, round, and reactive to light. Conjunctivae and EOM are normal Neck: Normal range of motion. Neck supple.  Cardiovascular: Normal rate and irregular rhythm.   Pulmonary/Chest: Effort normal and breath sounds without rales or wheezing.  Abd:  Soft, NT, ND, + BS Neurological: Pt is alert. Not confused , motor grossly intact Skin: Skin is warm. No rash, no LE edema Psychiatric: Pt behavior is normal. No agitation. not depressed affect     Assessment & Plan:

## 2015-11-20 NOTE — Assessment & Plan Note (Signed)
Resolved,  to f/u any worsening symptoms or concerns Lab Results  Component Value Date   WBC 5.6 05/20/2015   HGB 12.4 05/20/2015   HCT 37.6 05/20/2015   MCV 97.1 05/20/2015   PLT 197.0 05/20/2015

## 2015-11-20 NOTE — Assessment & Plan Note (Signed)
stable overall by history and exam, recent data reviewed with pt, and pt to continue medical treatment as before,  to f/u any worsening symptoms or concerns BP Readings from Last 3 Encounters:  11/20/15 120/74  09/12/15 108/64  05/20/15 106/58

## 2015-11-20 NOTE — Assessment & Plan Note (Signed)
stable overall by history and exam, recent data reviewed with pt, and pt to continue medical treatment as before,  to f/u any worsening symptoms or concerns Lab Results  Component Value Date   WBC 5.6 05/20/2015   HGB 12.4 05/20/2015   HCT 37.6 05/20/2015   PLT 197.0 05/20/2015   GLUCOSE 97 05/20/2015   CHOL 210* 05/20/2015   TRIG 187.0* 05/20/2015   HDL 42.40 05/20/2015   LDLDIRECT 156.7 10/24/2013   LDLCALC 130* 05/20/2015   ALT 8 05/20/2015   AST 15 05/20/2015   NA 140 05/20/2015   K 4.1 05/20/2015   CL 100 05/20/2015   CREATININE 0.82 05/20/2015   BUN 24* 05/20/2015   CO2 33* 05/20/2015   TSH 5.45* 05/20/2015   INR 0.98 01/01/2014   HGBA1C 5.6 05/20/2015

## 2015-11-20 NOTE — Assessment & Plan Note (Signed)
Mild elev LDL last visit in the summer after eating at the beach, for lower chol diet

## 2015-11-20 NOTE — Assessment & Plan Note (Signed)
Currently in afib, asympt, rate and volume ok, to f/u any worsening symptoms or concerns

## 2015-11-20 NOTE — Progress Notes (Signed)
Pre visit review using our clinic review tool, if applicable. No additional management support is needed unless otherwise documented below in the visit note. 

## 2015-11-20 NOTE — Patient Instructions (Addendum)
Please continue all other medications as before, and refills have been done if requested - the clorazepate and vicodin   Please have the pharmacy call with any other refills you may need.  Please continue your efforts at being more active, low cholesterol diet, and weight control.  You are otherwise up to date with prevention measures today.  Please keep your appointments with your specialists as you may have planned  Please return in 6 months, or sooner if needed

## 2015-11-27 ENCOUNTER — Other Ambulatory Visit: Payer: Self-pay | Admitting: Internal Medicine

## 2015-11-27 ENCOUNTER — Telehealth: Payer: Self-pay | Admitting: Internal Medicine

## 2015-11-27 NOTE — Telephone Encounter (Signed)
Pt is wanting to change to Dr. Larose Kells as PCP. Her sister-in-law, [employer], speaks very highly of him. Advised pt that Dr. Jenny Reichmann and Dr. Larose Kells would have to approve a transfer of care.

## 2015-11-27 NOTE — Telephone Encounter (Signed)
Ok with me 

## 2015-12-01 NOTE — Telephone Encounter (Signed)
Ok, please set up a 30 min appoint to get established

## 2015-12-01 NOTE — Telephone Encounter (Signed)
Is this ok with Dr. Larose Kells (already approved her husband and son)?

## 2015-12-03 NOTE — Telephone Encounter (Signed)
Called to schedule, pt not home, will call tomorrow

## 2015-12-18 ENCOUNTER — Telehealth: Payer: Self-pay | Admitting: Cardiovascular Disease

## 2015-12-18 NOTE — Telephone Encounter (Signed)
Felicia Ku PA aware of pt's symptoms. PA okay for pt to take an extra Metoprolol 12.5 mg  If A- fib episode occurs again. PA also recommends for pt to get Mg+ and BMET level  And F/U visit in 2 weeks. On O/V on 09/12/15 with Dr. Burt Knack MD recommends for pt to have a F/U visit in 6 months which it would be in April 2017. I spoke with pt she is aware to take the extra 12.5 mg dose of metoprolol, but she said she supposed to have an echo this spring prior seen Dr Burt Knack in April. Pt would like for [name] to know and see if its Okay to have labs and F/U in 2 weeks because she knows what Dr. Burt Knack like to do.

## 2015-12-18 NOTE — Telephone Encounter (Signed)
New problem   Pt has been in and out of aib since 1week and requesting a call back from nurse. Please call pt.

## 2015-12-18 NOTE — Telephone Encounter (Signed)
Pt called because this week she has been having low energy. Yesterday evening, she cook dinner for her granddaughter she felt very tired went to bed early, and when she lay down she felt like she was in A-fib. Her HR usually rungs in the 70's now it is running in the 90's. It seems that when she excerpt herself most of the time she goes in [place] . Pt states that last week she had LE edema, but  is better now. This AM BP is 130/71, HR 90 beats/minute. Pt feels better today. Pt would like to know if she has another episode of A-fib can she take an extra Metoprolol medication. Pt takes Metoprolol 12.5 mg two times daily and Tikosyn 250 mcg capsules one capsule by mouth two times daily.

## 2016-01-09 ENCOUNTER — Encounter: Payer: Self-pay | Admitting: Internal Medicine

## 2016-01-21 ENCOUNTER — Telehealth: Payer: Self-pay | Admitting: Cardiovascular Disease

## 2016-01-21 DIAGNOSIS — I480 Paroxysmal atrial fibrillation: Secondary | ICD-10-CM

## 2016-01-21 MED ORDER — DOFETILIDE 250 MCG PO CAPS: ORAL_CAPSULE | ORAL | Status: DC

## 2016-01-21 NOTE — Telephone Encounter (Signed)
Please call,pt had atrial fib last night. She said she took an extra Metoprolol last night.

## 2016-01-21 NOTE — Telephone Encounter (Signed)
Pt had an episode of AFib last night that lasted about 10-15 minutes.  Pt took an extra Metoprolol Tartrate 12.5mg  with this episode and AFib resolved. Today the pt has been okay other than fatigue.  In reviewing the pt's chart she did have an episode of AFib in February and it was recommended that she have a BMP and Magnesium checked.  The pt was not aware that she needed labs.  I have scheduled the pt to have lab work on 01/23/16.  The pt is due for follow-up office visit and echocardiogram in April.  I will have Dr Burt Knack review this information and determine if the pt needs to be seen earlier than April.  The pt also requested a Rx for Tikosyn be sent to [facility] due to lower cost of medication. Rx sent.

## 2016-01-23 ENCOUNTER — Other Ambulatory Visit (INDEPENDENT_AMBULATORY_CARE_PROVIDER_SITE_OTHER): Payer: Medicare Other | Admitting: *Deleted

## 2016-01-23 DIAGNOSIS — I480 Paroxysmal atrial fibrillation: Secondary | ICD-10-CM | POA: Diagnosis not present

## 2016-01-23 LAB — BASIC METABOLIC PANEL
BUN: 25 mg/dL (ref 7–25)
CO2: 29 mmol/L (ref 20–31)
Calcium: 9.4 mg/dL (ref 8.6–10.4)
Chloride: 101 mmol/L (ref 98–110)
Creat: 0.84 mg/dL (ref 0.60–0.93)
Glucose, Bld: 97 mg/dL (ref 65–99)
POTASSIUM: 4.3 mmol/L (ref 3.5–5.3)
Sodium: 140 mmol/L (ref 135–146)

## 2016-01-23 LAB — MAGNESIUM: MAGNESIUM: 2.2 mg/dL (ref 1.5–2.5)

## 2016-01-25 NOTE — Telephone Encounter (Signed)
Labs reviewed and look ok. Can keep appt in April unless sx's worsen and if that occurs fine to move her up.

## 2016-01-27 NOTE — Telephone Encounter (Signed)
I spoke with the pt and made her aware of lab results. I also scheduled her for Echocardiogram and follow-up office visit.

## 2016-02-06 ENCOUNTER — Telehealth: Payer: Self-pay | Admitting: Cardiovascular Disease

## 2016-02-06 NOTE — Telephone Encounter (Signed)
NEW MESSAGE   Pt wants to speak to rn about her son becoming a pt of Dr.Cooper

## 2016-02-06 NOTE — Telephone Encounter (Signed)
I spoke with [name] in regards to her son [name], DOB 10/02/59.  She said [name] had CP and vomiting yesterday and his PCP (Dr Larose Kells) sent him to ER.  The ER ruled the pt out for MI but felt like he needed a cardiology evaluation for HTN and strong family Hx of CAD.  The pt was started on protonix for GERD. I will discuss this request with Dr Burt Knack.

## 2016-02-07 NOTE — Telephone Encounter (Signed)
This is fine with me I'm just not sure when my next available appt is. If not timely he should see someone else in our practice.

## 2016-02-09 NOTE — Telephone Encounter (Signed)
Pt's son is scheduled to see Dr Burt Knack 02/10/16.

## 2016-02-12 ENCOUNTER — Ambulatory Visit (HOSPITAL_COMMUNITY): Payer: Medicare Other | Attending: Cardiovascular Disease

## 2016-02-12 ENCOUNTER — Other Ambulatory Visit: Payer: Self-pay

## 2016-02-12 DIAGNOSIS — Z951 Presence of aortocoronary bypass graft: Secondary | ICD-10-CM | POA: Insufficient documentation

## 2016-02-12 DIAGNOSIS — I340 Nonrheumatic mitral (valve) insufficiency: Secondary | ICD-10-CM | POA: Diagnosis not present

## 2016-02-12 DIAGNOSIS — I059 Rheumatic mitral valve disease, unspecified: Secondary | ICD-10-CM | POA: Diagnosis not present

## 2016-02-12 DIAGNOSIS — I071 Rheumatic tricuspid insufficiency: Secondary | ICD-10-CM | POA: Insufficient documentation

## 2016-02-12 DIAGNOSIS — I351 Nonrheumatic aortic (valve) insufficiency: Secondary | ICD-10-CM | POA: Insufficient documentation

## 2016-02-12 DIAGNOSIS — I119 Hypertensive heart disease without heart failure: Secondary | ICD-10-CM | POA: Diagnosis not present

## 2016-02-15 ENCOUNTER — Emergency Department (HOSPITAL_BASED_OUTPATIENT_CLINIC_OR_DEPARTMENT_OTHER)
Admission: EM | Admit: 2016-02-15 | Discharge: 2016-02-15 | Disposition: A | Discharge status: Home / Self Care | Payer: Medicare Other | Attending: Emergency Medicine | Admitting: Emergency Medicine

## 2016-02-15 ENCOUNTER — Emergency Department (HOSPITAL_BASED_OUTPATIENT_CLINIC_OR_DEPARTMENT_OTHER): Payer: Medicare Other

## 2016-02-15 ENCOUNTER — Encounter (HOSPITAL_BASED_OUTPATIENT_CLINIC_OR_DEPARTMENT_OTHER): Payer: Self-pay | Admitting: *Deleted

## 2016-02-15 DIAGNOSIS — I10 Essential (primary) hypertension: Secondary | ICD-10-CM | POA: Diagnosis not present

## 2016-02-15 DIAGNOSIS — Z9889 Other specified postprocedural states: Secondary | ICD-10-CM | POA: Diagnosis not present

## 2016-02-15 DIAGNOSIS — I480 Paroxysmal atrial fibrillation: Secondary | ICD-10-CM | POA: Insufficient documentation

## 2016-02-15 DIAGNOSIS — Z8673 Personal history of transient ischemic attack (TIA), and cerebral infarction without residual deficits: Secondary | ICD-10-CM | POA: Insufficient documentation

## 2016-02-15 DIAGNOSIS — R0781 Pleurodynia: Secondary | ICD-10-CM | POA: Diagnosis not present

## 2016-02-15 DIAGNOSIS — M1990 Unspecified osteoarthritis, unspecified site: Secondary | ICD-10-CM | POA: Insufficient documentation

## 2016-02-15 DIAGNOSIS — G629 Polyneuropathy, unspecified: Secondary | ICD-10-CM | POA: Insufficient documentation

## 2016-02-15 DIAGNOSIS — K219 Gastro-esophageal reflux disease without esophagitis: Secondary | ICD-10-CM | POA: Diagnosis not present

## 2016-02-15 DIAGNOSIS — I2510 Atherosclerotic heart disease of native coronary artery without angina pectoris: Secondary | ICD-10-CM | POA: Diagnosis not present

## 2016-02-15 DIAGNOSIS — F419 Anxiety disorder, unspecified: Secondary | ICD-10-CM | POA: Diagnosis not present

## 2016-02-15 DIAGNOSIS — E785 Hyperlipidemia, unspecified: Secondary | ICD-10-CM | POA: Diagnosis not present

## 2016-02-15 DIAGNOSIS — Z862 Personal history of diseases of the blood and blood-forming organs and certain disorders involving the immune mechanism: Secondary | ICD-10-CM | POA: Insufficient documentation

## 2016-02-15 DIAGNOSIS — R251 Tremor, unspecified: Secondary | ICD-10-CM | POA: Insufficient documentation

## 2016-02-15 DIAGNOSIS — Z7982 Long term (current) use of aspirin: Secondary | ICD-10-CM | POA: Diagnosis not present

## 2016-02-15 DIAGNOSIS — F329 Major depressive disorder, single episode, unspecified: Secondary | ICD-10-CM | POA: Insufficient documentation

## 2016-02-15 DIAGNOSIS — I5032 Chronic diastolic (congestive) heart failure: Secondary | ICD-10-CM | POA: Diagnosis not present

## 2016-02-15 DIAGNOSIS — J018 Other acute sinusitis: Secondary | ICD-10-CM | POA: Insufficient documentation

## 2016-02-15 DIAGNOSIS — K589 Irritable bowel syndrome without diarrhea: Secondary | ICD-10-CM | POA: Diagnosis not present

## 2016-02-15 DIAGNOSIS — R509 Fever, unspecified: Secondary | ICD-10-CM | POA: Diagnosis present

## 2016-02-15 DIAGNOSIS — Z79899 Other long term (current) drug therapy: Secondary | ICD-10-CM | POA: Diagnosis not present

## 2016-02-15 DIAGNOSIS — Z8774 Personal history of (corrected) congenital malformations of heart and circulatory system: Secondary | ICD-10-CM | POA: Insufficient documentation

## 2016-02-15 DIAGNOSIS — R531 Weakness: Secondary | ICD-10-CM | POA: Insufficient documentation

## 2016-02-15 LAB — CBC WITH DIFFERENTIAL/PLATELET
Basophils Absolute: 0 10*3/uL (ref 0.0–0.1)
Basophils Relative: 0 %
EOS PCT: 1 %
Eosinophils Absolute: 0.1 10*3/uL (ref 0.0–0.7)
HCT: 36.9 % (ref 36.0–46.0)
Hemoglobin: 11.7 g/dL — ABNORMAL LOW (ref 12.0–15.0)
LYMPHS ABS: 0.6 10*3/uL — AB (ref 0.7–4.0)
LYMPHS PCT: 9 %
MCH: 32 pg (ref 26.0–34.0)
MCHC: 31.7 g/dL (ref 30.0–36.0)
MCV: 100.8 fL — AB (ref 78.0–100.0)
MONO ABS: 0.6 10*3/uL (ref 0.1–1.0)
Monocytes Relative: 9 %
Neutro Abs: 5.6 10*3/uL (ref 1.7–7.7)
Neutrophils Relative %: 81 %
PLATELETS: 137 10*3/uL — AB (ref 150–400)
RBC: 3.66 MIL/uL — ABNORMAL LOW (ref 3.87–5.11)
RDW: 12.8 % (ref 11.5–15.5)
WBC: 7 10*3/uL (ref 4.0–10.5)

## 2016-02-15 LAB — BASIC METABOLIC PANEL
Anion gap: 9 (ref 5–15)
BUN: 19 mg/dL (ref 6–20)
CHLORIDE: 103 mmol/L (ref 101–111)
CO2: 26 mmol/L (ref 22–32)
Calcium: 9.3 mg/dL (ref 8.9–10.3)
Creatinine, Ser: 0.83 mg/dL (ref 0.44–1.00)
GFR calc Af Amer: >60 mL/min (ref >60)
GFR calc non Af Amer: >60 mL/min (ref >60)
GLUCOSE: 153 mg/dL — AB (ref 65–99)
POTASSIUM: 3.9 mmol/L (ref 3.5–5.1)
Sodium: 138 mmol/L (ref 135–145)

## 2016-02-15 MED ORDER — CLINDAMYCIN HCL 300 MG PO CAPS: 300.0000 mg | ORAL_CAPSULE | Freq: Three times a day (TID) | ORAL | Status: DC

## 2016-02-15 MED ORDER — CLINDAMYCIN HCL 150 MG PO CAPS
300.0000 mg | ORAL_CAPSULE | Freq: Once | ORAL | Status: AC
  Administered 2016-02-15: 300 mg via ORAL
  Filled 2016-02-15: qty 2

## 2016-02-15 NOTE — Discharge Instructions (Signed)
Sinusitis, Adult Take the antibiotic as prescribed. Call Dr. Redmond Baseman to be seen in the office if not improving in for 5 days. Return if you feel worse for any reason. Sinusitis is redness, soreness, and inflammation of the paranasal sinuses. Paranasal sinuses are air pockets within the bones of your face. They are located beneath your eyes, in the middle of your forehead, and above your eyes. In healthy paranasal sinuses, mucus is able to drain out, and air is able to circulate through them by way of your nose. However, when your paranasal sinuses are inflamed, mucus and air can become trapped. This can allow bacteria and other germs to grow and cause infection. Sinusitis can develop quickly and last only a short time (acute) or continue over a long period (chronic). Sinusitis that lasts for more than 12 weeks is considered chronic. CAUSES Causes of sinusitis include:  Allergies.  Structural abnormalities, such as displacement of the cartilage that separates your nostrils (deviated septum), which can decrease the air flow through your nose and sinuses and affect sinus drainage.  Functional abnormalities, such as when the small hairs (cilia) that line your sinuses and help remove mucus do not work properly or are not present. SIGNS AND SYMPTOMS Symptoms of acute and chronic sinusitis are the same. The primary symptoms are pain and pressure around the affected sinuses. Other symptoms include:  Upper toothache.  Earache.  Headache.  Bad breath.  Decreased sense of smell and taste.  A cough, which worsens when you are lying flat.  Fatigue.  Fever.  Thick drainage from your nose, which often is green and may contain pus (purulent).  Swelling and warmth over the affected sinuses. DIAGNOSIS Your health care provider will perform a physical exam. During your exam, your health care provider may perform any of the following to help determine if you have acute sinusitis or chronic  sinusitis:  Look in your nose for signs of abnormal growths in your nostrils (nasal polyps).  Tap over the affected sinus to check for signs of infection.  View the inside of your sinuses using an imaging device that has a light attached (endoscope). If your health care provider suspects that you have chronic sinusitis, one or more of the following tests may be recommended:  Allergy tests.  Nasal culture. A sample of mucus is taken from your nose, sent to a lab, and screened for bacteria.  Nasal cytology. A sample of mucus is taken from your nose and examined by your health care provider to determine if your sinusitis is related to an allergy. TREATMENT Most cases of acute sinusitis are related to a viral infection and will resolve on their own within 10 days. Sometimes, medicines are prescribed to help relieve symptoms of both acute and chronic sinusitis. These may include pain medicines, decongestants, nasal steroid sprays, or saline sprays. However, for sinusitis related to a bacterial infection, your health care provider will prescribe antibiotic medicines. These are medicines that will help kill the bacteria causing the infection. Rarely, sinusitis is caused by a fungal infection. In these cases, your health care provider will prescribe antifungal medicine. For some cases of chronic sinusitis, surgery is needed. Generally, these are cases in which sinusitis recurs more than 3 times per year, despite other treatments. HOME CARE INSTRUCTIONS  Drink plenty of water. Water helps thin the mucus so your sinuses can drain more easily.  Use a humidifier.  Inhale steam 3-4 times a day (for example, sit in the bathroom with the shower  running).  Apply a warm, moist washcloth to your face 3-4 times a day, or as directed by your health care provider.  Use saline nasal sprays to help moisten and clean your sinuses.  Take medicines only as directed by your health care provider.  If you were  prescribed either an antibiotic or antifungal medicine, finish it all even if you start to feel better. SEEK IMMEDIATE MEDICAL CARE IF:  You have increasing pain or severe headaches.  You have nausea, vomiting, or drowsiness.  You have swelling around your face.  You have vision problems.  You have a stiff neck.  You have difficulty breathing.   This information is not intended to replace advice given to you by your health care provider. Make sure you discuss any questions you have with your health care provider.   Document Released: 11/01/2005 Document Revised: 11/22/2014 Document Reviewed: 11/16/2011 Elsevier Interactive Patient Education [employer].

## 2016-02-15 NOTE — ED Provider Notes (Addendum)
CSN: [id number]     Arrival date & time 02/15/16  1642 History  By signing my name below, I, Rohini Rajnarayanan, attest that this documentation has been prepared under the direction and in the presence of Orlie Dakin, MD Electronically Signed: Evonnie Dawes, ED Scribe 02/15/2016 at 5:54 PM.   Chief Complaint  Patient presents with  . Facial Pain  . Fever   The history is provided by the patient. No language interpreter was used.   HPI Comments: [name] is a 81 y.o. female with an extensive pmhx, who presents to the Emergency Department complaining of an exacerbation of her sinus pain, congestion and HA, with an associated fever of T-max 101.5, within the past week. Pt has also had a Slight cough and right rib pain during this time. She also states that she feels "trembly," and slid out of her chair due to weakness and was unable to get up this afternoon. Pt is taking cough drops to relief her cough. Pt normally irrigates once a day to feel comfortable, however she has recently had to irrigate >2 times to feel comfortable., And is getting yellow material out with sinus irrigation and pt states that it is resemblant of a sinus infection. Pt denies any light-headedness or fear of syncope. Pt denies any smoking or alcohol use. Pt is seeing her cardiologist tomorrow. Pt's ENT is Dr. Redmond Baseman. She was prescribed Coumadin in the past, but was taken off it due to excessive hemorrhaging.    Past Medical History  Diagnosis Date  . OSA (obstructive sleep apnea)     pt denies this  . HTN (hypertension)   . Atrial fibrillation (HCC)     paroxysmal; on coumadin  . CAD (coronary artery disease)     a. cath 7/11: LM 40%, mild plaque disease in CFX, LAD, and RCA;  b. 2011 CABG with S-LAD and S-OM done at time of Aortic dissection repair  . Chronic diastolic CHF (congestive heart failure) ([place])     a. 08/2011 Echo: EF 55-60%, PASP 84mmHg  . Dissection of aorta, thoracic ([place])     a. Type A; s/p  repair 7/11 with aortic root repair and CABG x 2   . ASD (atrial septal defect)     a. s/p repair 1982 at [facility]  . Right heart failure (HCC)     a. 2/2 TR and RV dysfxn;  b. echo 4/12: EF 60%, mild LVH, mild AI, mild MR, mod LAE, mod RVE with mod dec. RVSF, mod RAE, mod to severe TR, PASP 58;  c. right heart cath 4/12:  RA mean 8, RV 46/1 with mean 6, PA 45/13 with mean 26, PCWP mean 14, CO 3.68, CI 2.1 (no sig pulmon HTN)  . GERD (gastroesophageal reflux disease)   . IBS (irritable bowel syndrome)   . Esophageal stricture   . Colonic polyp   . Peripheral neuropathy ([place])   . Fibromyalgia   . Anxiety   . HLD (hyperlipidemia)   . Borderline diabetes   . History of TIAs   . Normocytic anemia   . Thrombocytopenia ([place])   . Macular degeneration   . Depression   . Esophageal dysmotilities   . Cancer (HCC)     squamous cell CA  . Sleep apnea     pt denies OSA  . Anemia, iron deficiency 11/21/2014  . DJD (degenerative joint disease)   . Osteoarthritis    Past Surgical History  Procedure Laterality Date  . Asd repair  1982    [facility]  . Shoulder open rotator cuff repair Right 2007  . Cardioversion  1982; 1995 X 2  . Appendectomy    . Vaginal hysterectomy  1987    A/P Repair With Cystocele and rectocele repair   . Salpingoophorectomy Bilateral 1994  . Pelvic laparoscopy  1994  . Emergency redo median sternotomy for hemiarch repair of acute type a aortic  dissection  06/05/2010  . Nasal hemorrhage control  12/27/2011    Procedure: EPISTAXIS CONTROL;  Surgeon: Ruby Cola, MD;  Location: [facility] OR;  Service: ENT;  Laterality: N/A;  . Cataract extraction w/ intraocular lens  implant, bilateral Bilateral   . Cardiac catheterization  ~ 1971; 1982  . Esophagogastroduodenoscopy (egd) with esophageal dilation  "several times"  . Tubal ligation  1969  . Tonsillectomy and adenoidectomy  ~ 1944  . Nasal hemorrhage control  2013    "at [facility]"   Family History  Problem Relation Age of Onset   . Pancreatic cancer Sister   . Osteoporosis Sister   . Lung cancer Brother     x 2  . Heart attack Brother   . Melanoma Sister   . Diabetes Mother   . Hypertension Mother   . ALS Mother   . Colon polyps Sister   . Heart disease Brother     x 2  . Esophageal cancer Neg Hx   . Colon cancer Neg Hx   . Rectal cancer Neg Hx   . Stomach cancer Neg Hx    Social History  Substance Use Topics  . Smoking status: Never Smoker   . Smokeless tobacco: Never Used  . Alcohol Use: No   OB History    Gravida Para Term Preterm AB TAB SAB Ectopic Multiple Living   4 3 3  1     3      Review of Systems  Constitutional: Positive for fever.  HENT: Positive for congestion.   Respiratory: Positive for cough.   Cardiovascular: Negative.   Gastrointestinal: Negative.   Musculoskeletal: Negative.   Skin: Negative.   Neurological: Positive for weakness.  Psychiatric/Behavioral: Negative.   All other systems reviewed and are negative.     Allergies  Amiodarone; Crestor; Ropinirole hydrochloride; Atorvastatin; Codeine; Erythromycin; Erythromycin base; Ezetimibe; Meperidine; Meperidine hcl; Morphine; Neomycin-bacitracin zn-polymyx; and Simvastatin  Home Medications   Prior to Admission medications   Medication Sig Start Date End Date Taking? Authorizing Provider  acetaminophen (TYLENOL) 325 MG tablet Take 650 mg by mouth every 6 (six) hours as needed for mild pain.    Historical Provider, MD  amoxicillin (AMOXIL) 500 MG capsule TAKE 4 CAPSULES BY MOUTH 1 HOUR PRIOR TO DENTAL PROCEDURES. 11/20/15   Biagio Borg, MD  aspirin EC 81 MG tablet Take 1 tablet (81 mg total) by mouth daily. 05/11/13   Sherren Mocha, MD  cholecalciferol (VITAMIN D) 1000 UNITS tablet Take 1,000 Units by mouth daily.     Historical Provider, MD  clorazepate (TRANXENE) 7.5 MG tablet Take 1 tablet (7.5 mg total) by mouth at bedtime as needed. for sleep 11/20/15   Biagio Borg, MD  docusate sodium (COLACE) 50 MG capsule Take 50  mg by mouth 2 (two) times daily.    Historical Provider, MD  dofetilide (TIKOSYN) 250 MCG capsule TAKE ONE CAPSULE BY MOUTH EVERY 12 HOURS 01/21/16   Sherren Mocha, MD  gabapentin (NEURONTIN) 300 MG capsule TAKE 2 CAPSULES BY MOUTH AT BEDTIME 11/18/15   Biagio Borg, MD  glucosamine-chondroitin 500-400 MG tablet Take 1 tablet by mouth 2 (two) times daily.    Historical Provider, MD  HYDROcodone-acetaminophen (NORCO/VICODIN) 5-325 MG tablet Take 1 tablet by mouth every 6 (six) hours as needed for moderate pain. 11/20/15   Biagio Borg, MD  KLOR-CON M20 20 MEQ tablet TAKE 1 TABLET (20 MEQ TOTAL) BY MOUTH DAILY. 07/07/15   Biagio Borg, MD  metoprolol tartrate (LOPRESSOR) 25 MG tablet Take 12.5 mg by mouth 2 (two) times daily.    Historical Provider, MD  metoprolol tartrate (LOPRESSOR) 25 MG tablet TAKE 0.5 TABLETS (12.5 MG TOTAL) BY MOUTH 2 (TWO) TIMES DAILY. 11/27/15   Biagio Borg, MD  Multiple Vitamins-Minerals (EYE VITAMINS PO) Take 1 tablet by mouth 2 (two) times daily.     Historical Provider, MD  omeprazole (PRILOSEC) 20 MG capsule Take 20 mg by mouth daily.    Historical Provider, MD  omeprazole (PRILOSEC) 20 MG capsule TAKE 1 CAPSULE BY MOUTH TWICE A DAY 30 MINUTES BEFORE MEALS 11/14/15   Biagio Borg, MD  Polyethylene Glycol 3350 (MIRALAX PO) Take 1 packet by mouth daily as needed (constipation).     Historical Provider, MD  sertraline (ZOLOFT) 50 MG tablet Take 1 tablet (50 mg total) by mouth daily. 06/09/15   Biagio Borg, MD  spironolactone (ALDACTONE) 25 MG tablet Take 0.5 tablets (12.5 mg total) by mouth daily. 12/03/14   Sherren Mocha, MD  torsemide (DEMADEX) 20 MG tablet Take 2 tablets (40 mg total) by mouth daily. 12/03/14   Sherren Mocha, MD  traMADol Veatrice Bourbon) 50 MG tablet TAKE 1 TABLET TWICE A DAY AS NEEDED 10/21/15   Biagio Borg, MD  vitamin C (ASCORBIC ACID) 500 MG tablet Take 500 mg by mouth daily.    Historical Provider, MD   BP 101/68 mmHg  Pulse 94  Temp(Src) 100.2 F (37.9 C)  (Oral)  Resp 16  Ht 5\' 7"  (1.702 m)  Wt 155 lb 8 oz (70.534 kg)  BMI 24.35 kg/m2  SpO2 95% Physical Exam  Constitutional: She is oriented to person, place, and time. She appears well-developed and well-nourished. No distress.  HENT:  Head: Normocephalic and atraumatic.  Right Ear: External ear normal.  Left Ear: External ear normal.  Nose: Nose normal.  Mouth/Throat: Oropharynx is clear and moist.  Eyes: Conjunctivae are normal. Pupils are equal, round, and reactive to light.  Neck: Neck supple. No tracheal deviation present. No thyromegaly present.  Cardiovascular: Normal rate and regular rhythm.   No murmur heard. Pulmonary/Chest: Effort normal and breath sounds normal.  Abdominal: Soft. Bowel sounds are normal. She exhibits no distension. There is no tenderness.  Musculoskeletal: Normal range of motion. She exhibits no edema or tenderness.  Neurological: She is alert and oriented to person, place, and time. No cranial nerve deficit. Coordination normal.  Gait normal not lightheaded on standing  Skin: Skin is warm and dry. No rash noted.  Psychiatric: She has a normal mood and affect.  Nursing note and vitals reviewed.   ED Course  Procedures  DIAGNOSTIC STUDIES: Oxygen Saturation is 95% on RA, low by my interpretation.    COORDINATION OF CARE: 5:41 PM-Discussed treatment plan which includes blood work, I-Stat CG4 lactic acid, and DG Chest, with pt at bedside and pt agreed to plan.   Labs Review Labs Reviewed - No data to display  Imaging Review No results found. I have personally reviewed and evaluated these images and lab results as part of my medical decision-making.  EKG Interpretation None     7: 15 PM patient resting comfortably. Alert Glasgow Coma Score 15. Chest x-ray viewed by me Results for orders placed or performed during the hospital encounter of [id number]  Basic metabolic panel  Result Value Ref Range   Sodium 138 135 - 145 mmol/L   Potassium 3.9  3.5 - 5.1 mmol/L   Chloride 103 101 - 111 mmol/L   CO2 26 22 - 32 mmol/L   Glucose, Bld 153 (H) 65 - 99 mg/dL   BUN 19 6 - 20 mg/dL   Creatinine, Ser 0.83 0.44 - 1.00 mg/dL   Calcium 9.3 8.9 - 10.3 mg/dL   GFR calc non Af Amer >60 >60 mL/min   GFR calc Af Amer >60 >60 mL/min   Anion gap 9 5 - 15  CBC with Differential/Platelet  Result Value Ref Range   WBC 7.0 4.0 - 10.5 K/uL   RBC 3.66 (L) 3.87 - 5.11 MIL/uL   Hemoglobin 11.7 (L) 12.0 - 15.0 g/dL   HCT 36.9 36.0 - 46.0 %   MCV 100.8 (H) 78.0 - 100.0 fL   MCH 32.0 26.0 - 34.0 pg   MCHC 31.7 30.0 - 36.0 g/dL   RDW 12.8 11.5 - 15.5 %   Platelets 137 (L) 150 - 400 K/uL   Neutrophils Relative % 81 %   Neutro Abs 5.6 1.7 - 7.7 K/uL   Lymphocytes Relative 9 %   Lymphs Abs 0.6 (L) 0.7 - 4.0 K/uL   Monocytes Relative 9 %   Monocytes Absolute 0.6 0.1 - 1.0 K/uL   Eosinophils Relative 1 %   Eosinophils Absolute 0.1 0.0 - 0.7 K/uL   Basophils Relative 0 %   Basophils Absolute 0.0 0.0 - 0.1 K/uL   Dg Chest 2 View  02/15/2016  CLINICAL DATA:  Sinus congestion cough fever for several weeks worse for several days EXAM: CHEST  2 VIEW COMPARISON:  11/29/2014 FINDINGS: Status post CABG. Moderate cardiac enlargement. Vascular pattern within normal limits. No evidence of infiltrate or edema. Minimal scarring or atelectasis in the lingula, stable. No pleural effusion. IMPRESSION: Stable findings with no acute abnormality Electronically Signed   By: Skipper Cliche M.D.   On: 02/15/2016 19:00   Chest x-ray viewed by me MDM  No signs of sepsis. Plan prescription clindamycin. Patient prefers not to have Augmentin as it has upset her stomach Continue sinus irrigation. Follow-up with Dr. Redmond Baseman if not improved in 4 or 5 days Final diagnoses:  None  Diagnosis acute sinusitis  I personally performed the services described in this documentation, which was scribed in my presence. The recorded information has been reviewed and considered.      Orlie Dakin, MD 02/15/16 Isabelle Course, MD 02/15/16 [id number]

## 2016-02-15 NOTE — ED Notes (Signed)
Fever today, C/o sinus pain and congestion. States she slid out of her her chair this afternoon and had difficulty getting up. C/o slight cough and right rib pain

## 2016-02-16 ENCOUNTER — Encounter: Payer: Self-pay | Admitting: Cardiovascular Disease

## 2016-02-16 ENCOUNTER — Ambulatory Visit (INDEPENDENT_AMBULATORY_CARE_PROVIDER_SITE_OTHER): Payer: Medicare Other | Admitting: Cardiovascular Disease

## 2016-02-16 VITALS — BP 108/70 | HR 88 | Ht 66.0 in | Wt 159.1 lb

## 2016-02-16 DIAGNOSIS — I480 Paroxysmal atrial fibrillation: Secondary | ICD-10-CM | POA: Diagnosis not present

## 2016-02-16 DIAGNOSIS — I2510 Atherosclerotic heart disease of native coronary artery without angina pectoris: Secondary | ICD-10-CM

## 2016-02-16 DIAGNOSIS — R55 Syncope and collapse: Secondary | ICD-10-CM

## 2016-02-16 MED ORDER — SPIRONOLACTONE 25 MG PO TABS: 25.0000 mg | ORAL_TABLET | Freq: Every day | ORAL | Status: DC

## 2016-02-16 NOTE — Progress Notes (Signed)
Cardiology Office Note Date:  02/16/2016   ID:  [name], DOB Nov 16, 1934, MRN [id number]  PCP:  Kathlene November, MD  Cardiologist:  Sherren Mocha, MD    Chief Complaint  Patient presents with  . Coronary Artery Disease     History of Present Illness: [name] is a 81 y.o. female who presents for follow-up of complex cardiac disease. She has a history of remote surgical repair of an atrial septal defect. In 2011 she presented with a type A aortic dissection and underwent emergent surgical repair. She was treated with multivessel CABG at that time as well. The patient has also been followed for atrial fibrillation, severe tricuspid regurgitation, and chronic congestive heart failure. She has been unable to tolerate warfarin because of severe recurrent epistaxis requiring ENT surgeries.  The patient comes in today for scheduled follow-up. She was just seen yesterday in the emergency room with fever, chills, and sinus congestion. She is being treated for an upper respiratory infection with antibiotics now. Her chronic symptoms include dyspnea with exertion, especially in cold weather. She has not had chest pain or chest pressure. She denies edema, orthopnea, or PND. She's still able to walk 1 mile most days, but has more difficulty in the cold weather. She feels more limited by back pain and shortness of breath or other cardiac symptoms. She did have one episode of palpitations and thought this was likely related to atrial fibrillation.  Past Medical History  Diagnosis Date  . OSA (obstructive sleep apnea)     pt denies this  . HTN (hypertension)   . Atrial fibrillation (HCC)     paroxysmal; on coumadin  . CAD (coronary artery disease)     a. cath 7/11: LM 40%, mild plaque disease in CFX, LAD, and RCA;  b. 2011 CABG with S-LAD and S-OM done at time of Aortic dissection repair  . Chronic diastolic CHF (congestive heart failure) ([place])     a. 08/2011 Echo: EF 55-60%, PASP 23mmHg    . Dissection of aorta, thoracic ([place])     a. Type A; s/p repair 7/11 with aortic root repair and CABG x 2   . ASD (atrial septal defect)     a. s/p repair 1982 at [facility]  . Right heart failure (HCC)     a. 2/2 TR and RV dysfxn;  b. echo 4/12: EF 60%, mild LVH, mild AI, mild MR, mod LAE, mod RVE with mod dec. RVSF, mod RAE, mod to severe TR, PASP 58;  c. right heart cath 4/12:  RA mean 8, RV 46/1 with mean 6, PA 45/13 with mean 26, PCWP mean 14, CO 3.68, CI 2.1 (no sig pulmon HTN)  . GERD (gastroesophageal reflux disease)   . IBS (irritable bowel syndrome)   . Esophageal stricture   . Colonic polyp   . Peripheral neuropathy ([place])   . Fibromyalgia   . Anxiety   . HLD (hyperlipidemia)   . Borderline diabetes   . History of TIAs   . Normocytic anemia   . Thrombocytopenia ([place])   . Macular degeneration   . Depression   . Esophageal dysmotilities   . Cancer (HCC)     squamous cell CA  . Sleep apnea     pt denies OSA  . Anemia, iron deficiency 11/21/2014  . DJD (degenerative joint disease)   . Osteoarthritis     Past Surgical History  Procedure Laterality Date  . Asd repair  1982    [facility]  .  Shoulder open rotator cuff repair Right 2007  . Cardioversion  1982; 1995 X 2  . Appendectomy    . Vaginal hysterectomy  1987    A/P Repair With Cystocele and rectocele repair   . Salpingoophorectomy Bilateral 1994  . Pelvic laparoscopy  1994  . Emergency redo median sternotomy for hemiarch repair of acute type a aortic  dissection  06/05/2010  . Nasal hemorrhage control  12/27/2011    Procedure: EPISTAXIS CONTROL;  Surgeon: Ruby Cola, MD;  Location: [facility] OR;  Service: ENT;  Laterality: N/A;  . Cataract extraction w/ intraocular lens  implant, bilateral Bilateral   . Cardiac catheterization  ~ 1971; 1982  . Esophagogastroduodenoscopy (egd) with esophageal dilation  "several times"  . Tubal ligation  1969  . Tonsillectomy and adenoidectomy  ~ 1944  . Nasal hemorrhage control  2013    "at  [facility]"    Current Outpatient Prescriptions  Medication Sig Dispense Refill  . acetaminophen (TYLENOL) 325 MG tablet Take 650 mg by mouth every 6 (six) hours as needed for mild pain.    Marland Kitchen amoxicillin (AMOXIL) 500 MG capsule TAKE 4 CAPSULES BY MOUTH 1 HOUR PRIOR TO DENTAL PROCEDURES. 4 capsule 0  . aspirin EC 81 MG tablet Take 1 tablet (81 mg total) by mouth daily. 1 tablet 0  . cholecalciferol (VITAMIN D) 1000 UNITS tablet Take 1,000 Units by mouth daily.     . clindamycin (CLEOCIN) 300 MG capsule Take 1 capsule (300 mg total) by mouth 3 (three) times daily. X 7 days 21 capsule 0  . clorazepate (TRANXENE) 7.5 MG tablet Take 1 tablet (7.5 mg total) by mouth at bedtime as needed. for sleep 30 tablet 5  . docusate sodium (COLACE) 50 MG capsule Take 50 mg by mouth 2 (two) times daily.    Marland Kitchen dofetilide (TIKOSYN) 250 MCG capsule TAKE ONE CAPSULE BY MOUTH EVERY 12 HOURS 180 capsule 3  . gabapentin (NEURONTIN) 300 MG capsule TAKE 2 CAPSULES BY MOUTH AT BEDTIME 180 capsule 1  . glucosamine-chondroitin 500-400 MG tablet Take 1 tablet by mouth 2 (two) times daily.    Marland Kitchen HYDROcodone-acetaminophen (NORCO/VICODIN) 5-325 MG tablet Take 1 tablet by mouth every 6 (six) hours as needed for moderate pain. 30 tablet 0  . KLOR-CON M20 20 MEQ tablet TAKE 1 TABLET (20 MEQ TOTAL) BY MOUTH DAILY. 90 tablet 3  . metoprolol tartrate (LOPRESSOR) 25 MG tablet Take 12.5 mg by mouth 2 (two) times daily.    . Multiple Vitamins-Minerals (EYE VITAMINS PO) Take 1 tablet by mouth 2 (two) times daily.     Marland Kitchen omeprazole (PRILOSEC) 20 MG capsule Take 20 mg by mouth daily.    . Polyethylene Glycol 3350 (MIRALAX PO) Take 1 packet by mouth daily as needed (constipation).     . sertraline (ZOLOFT) 50 MG tablet Take 1 tablet (50 mg total) by mouth daily. 90 tablet 3  . spironolactone (ALDACTONE) 25 MG tablet Take 0.5 tablets (12.5 mg total) by mouth daily. 90 tablet 3  . torsemide (DEMADEX) 20 MG tablet Take 2 tablets (40 mg total) by  mouth daily. 90 tablet 3  . traMADol (ULTRAM) 50 MG tablet Take 50 mg by mouth every 12 (twelve) hours as needed for moderate pain.    . vitamin C (ASCORBIC ACID) 500 MG tablet Take 500 mg by mouth daily.     No current facility-administered medications for this visit.    Allergies:   Amiodarone; Crestor; Ropinirole hydrochloride; Atorvastatin; Codeine; Erythromycin; Erythromycin base;  Ezetimibe; Meperidine; Meperidine hcl; Morphine; Neomycin-bacitracin zn-polymyx; and Simvastatin   Social History:  The patient  reports that she has never smoked. She has never used smokeless tobacco. She reports that she does not drink alcohol or use illicit drugs.   Family History:  The patient's  family history includes ALS in her mother; Colon polyps in her sister; Diabetes in her mother; Heart attack in her brother; Heart disease in her brother; Hypertension in her mother; Lung cancer in her brother; Melanoma in her sister; Osteoporosis in her sister; Pancreatic cancer in her sister. There is no history of Esophageal cancer, Colon cancer, Rectal cancer, or Stomach cancer.   ROS:  Please see the history of present illness.  Otherwise, review of systems is positive for chills, cough, DOE, fever.  All other systems are reviewed and negative.   PHYSICAL EXAM: VS:  BP 108/70 mmHg  Pulse 88  Ht 5\' 6"  (1.676 m)  Wt 159 lb 1.9 oz (72.176 kg)  BMI 25.69 kg/m2 , BMI Body mass index is 25.69 kg/(m^2). GEN: Well nourished, well developed, in no acute distress HEENT: normal Neck: JVP elevated with visible V waves in the neck, no masses. Cardiac: RRR with grade 3/6 holosystolic murmur at the apex and left lower sternal border              Respiratory:  clear to auscultation bilaterally, normal work of breathing GI: soft, nontender, nondistended, + BS MS: no deformity or atrophy Ext: trace pretibial edema bilaterally Skin: warm and dry, no rash Neuro:  Strength and sensation are intact Psych: euthymic mood, full  affect  EKG:  EKG is ordered today. The ekg ordered today shows NSR 88 bpm, prolonged QT with QTc 493 ms/QT 408 ms  Recent Labs: 02/20/2015: Pro B Natriuretic peptide (BNP) 278.0* 05/20/2015: ALT 8; TSH 5.45* 01/23/2016: Magnesium 2.2 02/15/2016: BUN 19; Creatinine, Ser 0.83; Hemoglobin 11.7*; Platelets 137*; Potassium 3.9; Sodium 138   Lipid Panel     Component Value Date/Time   CHOL 210* 05/20/2015 1504   TRIG 187.0* 05/20/2015 1504   HDL 42.40 05/20/2015 1504   CHOLHDL 5 05/20/2015 1504   VLDL 37.4 05/20/2015 1504   LDLCALC 130* 05/20/2015 1504   LDLDIRECT 156.7 10/24/2013 0820      Wt Readings from Last 3 Encounters:  02/16/16 159 lb 1.9 oz (72.176 kg)  02/15/16 155 lb 8 oz (70.534 kg)  11/20/15 157 lb (71.215 kg)    Cardiac Studies Reviewed: Echo 02/12/2016: Study Conclusions  - Left ventricle: The cavity size was normal. There was mild  concentric hypertrophy. Systolic function was normal. The  estimated ejection fraction was in the range of 55% to 60%. Wall  motion was normal; there were no regional wall motion  abnormalities. Features are consistent with a pseudonormal left  ventricular filling pattern, with concomitant abnormal relaxation  and increased filling pressure (grade 2 diastolic dysfunction). - Ventricular septum: Septal motion showed paradox. The contour  showed diastolic flattening. These changes are consistent with RV  volume overload. - Aortic valve: There was moderate regurgitation directed centrally  in the LVOT. - Mitral valve: Calcified annulus. Mildly thickened leaflets .  Moderate, holosystolicprolapse, involving the anterior leaflet.  There was moderate to severe regurgitation directed eccentrically  and posteriorly. - Left atrium: The atrium was massively dilated. - Right ventricle: The cavity size was mildly to moderately  dilated. Systolic function was moderately reduced. - Right atrium: The atrium was severely dilated. -  Atrial septum: No defect or patent  foramen ovale was identified. - Tricuspid valve: There was malcoaptation of the valve leaflets.  There was severe regurgitation directed centrally. - Pulmonary arteries: Systolic pressure was moderately increased.   ASSESSMENT AND PLAN: 1.  Chronic diastolic heart failure, NYHA functional class II: Recent echo study reviewed. Patient with complex valvular disease with severe mitral and tricuspid regurgitation. She's had 2 previous cardiac surgeries. Best to continue with medical therapy at present. LV function is preserved. She does have moderate RV dysfunction. Despite that, symptomatically she is getting along pretty well. I think we should increase her Aldactone to 25 mg daily. She will continue on torsemide 40 mg daily. I will see her back in 6 months.  2. Severe mitral and tricuspid regurgitation: Recent echo study reviewed. Tricuspid regurgitation likely related to annular dilatation.  3. Paroxysmal atrial fibrillation: Appears to be maintaining sinus rhythm on Tikosyn. Unable to tolerate anticoagulation because of severe recurrent epistaxis requiring multiple interventions and surgeries. Not a good candidate for watchman because of previous ASD surgical repair.  4. Coronary artery disease status post coronary bypass surgery: This was performed at the time of her emergent aortic dissection repair. No symptoms of angina.  Current medicines are reviewed with the patient today.  The patient does not have concerns regarding medicines.  Labs/ tests ordered today include:  No orders of the defined types were placed in this encounter.    Disposition:   FU 6 months  Signed, Sherren Mocha, MD  02/16/2016 2:30 PM    [address], [place], [place]  [address] Phone: [phone]; Fax: [phone]

## 2016-02-16 NOTE — Patient Instructions (Addendum)
Medication Instructions:  Your physician has recommended you make the following change in your medication:  1. INCREASE Spironolactone to 25mg  take one tablet by mouth daily  Labwork: No new orders.   Testing/Procedures: No new orders.   Follow-Up: Your physician wants you to follow-up in: 6 MONTHS with Dr Burt Knack.  You will receive a reminder letter in the mail two months in advance. If you don't receive a letter, please call our office to schedule the follow-up appointment.   Any Other Special Instructions Will Be Listed Below (If Applicable).     If you need a refill on your cardiac medications before your next appointment, please call your pharmacy.

## 2016-02-17 LAB — I-STAT CG4 LACTIC ACID, ED: LACTIC ACID, VENOUS: 1.43 mmol/L (ref 0.5–2.0)

## 2016-02-18 ENCOUNTER — Ambulatory Visit (INDEPENDENT_AMBULATORY_CARE_PROVIDER_SITE_OTHER): Payer: Medicare Other | Admitting: Internal Medicine

## 2016-02-18 ENCOUNTER — Encounter: Payer: Self-pay | Admitting: Internal Medicine

## 2016-02-18 VITALS — BP 126/74 | HR 62 | Temp 97.9°F | Ht 66.0 in | Wt 155.4 lb

## 2016-02-18 DIAGNOSIS — I2510 Atherosclerotic heart disease of native coronary artery without angina pectoris: Secondary | ICD-10-CM

## 2016-02-18 DIAGNOSIS — R509 Fever, unspecified: Secondary | ICD-10-CM | POA: Diagnosis not present

## 2016-02-18 DIAGNOSIS — R52 Pain, unspecified: Secondary | ICD-10-CM

## 2016-02-18 DIAGNOSIS — Z09 Encounter for follow-up examination after completed treatment for conditions other than malignant neoplasm: Secondary | ICD-10-CM | POA: Insufficient documentation

## 2016-02-18 NOTE — Assessment & Plan Note (Signed)
Febrile illness: 81 year old female with multiple medical problems with a febrile illness for the last several days, chest x-ray negative and WBCs not elevated @ the ER. Flu test today negative. DDX sinusitis versus viral syndrome. Will continue w/ clindamycin, otherwise conservative treatment. See instructions. RTC within few weeks

## 2016-02-18 NOTE — Progress Notes (Signed)
Pre visit review using our clinic review tool, if applicable. No additional management support is needed unless otherwise documented below in the visit note. 

## 2016-02-18 NOTE — Patient Instructions (Signed)
Rest, fluids , tylenol  For cough:  Take Mucinex DM twice a day as needed until better  For nasal congestion: Use OTC Nasocort or Flonase : 2 nasal sprays on each side of the nose in the morning until you feel better   Avoid decongestants such as  Pseudoephedrine or phenylephrine    Take the antibiotic as prescribed   Call if not gradually better over the next  10 days  Call anytime if the symptoms are severe, you have high fever, short of breath, chest pain

## 2016-02-18 NOTE — Progress Notes (Signed)
Subjective:    Patient ID: [name], female    DOB: Nov 21, 1934, 81 y.o.   MRN: [id number]  DOS:  02/18/2016 Type of visit - description : Acute visit Interval history: Went to the ER 02/15/2016 with sinus pain, congestion, headache, fever MAXIMUM TEMPERATURE 101.5 for the  last few days. Labs: Hemoglobin 11.7, WBC 7.0, platelets 137, is slightly low Chest x-ray: No acute   She is here because she continue with the same symptoms, on and off fever, overall feeling very weak. She also has aches all over Mild cough, dry My nausea without vomiting No particularly short of breath No rash    Review of Systems See HPI  Past Medical History  Diagnosis Date  . OSA (obstructive sleep apnea)     pt denies this  . HTN (hypertension)   . Atrial fibrillation (HCC)     paroxysmal; on coumadin  . CAD (coronary artery disease)     a. cath 7/11: LM 40%, mild plaque disease in CFX, LAD, and RCA;  b. 2011 CABG with S-LAD and S-OM done at time of Aortic dissection repair  . Chronic diastolic CHF (congestive heart failure) ([place])     a. 08/2011 Echo: EF 55-60%, PASP 12mmHg  . Dissection of aorta, thoracic ([place])     a. Type A; s/p repair 7/11 with aortic root repair and CABG x 2   . ASD (atrial septal defect)     a. s/p repair 1982 at [facility]  . Right heart failure (HCC)     a. 2/2 TR and RV dysfxn;  b. echo 4/12: EF 60%, mild LVH, mild AI, mild MR, mod LAE, mod RVE with mod dec. RVSF, mod RAE, mod to severe TR, PASP 58;  c. right heart cath 4/12:  RA mean 8, RV 46/1 with mean 6, PA 45/13 with mean 26, PCWP mean 14, CO 3.68, CI 2.1 (no sig pulmon HTN)  . GERD (gastroesophageal reflux disease)   . IBS (irritable bowel syndrome)   . Esophageal stricture   . Colonic polyp   . Peripheral neuropathy ([place])   . Fibromyalgia   . Anxiety   . HLD (hyperlipidemia)   . Borderline diabetes   . History of TIAs   . Normocytic anemia   . Thrombocytopenia ([place])   . Macular degeneration   . Depression    . Esophageal dysmotilities   . Cancer (HCC)     squamous cell CA  . Sleep apnea     pt denies OSA  . Anemia, iron deficiency 11/21/2014  . DJD (degenerative joint disease)   . Osteoarthritis     Past Surgical History  Procedure Laterality Date  . Asd repair  1982    [facility]  . Shoulder open rotator cuff repair Right 2007  . Cardioversion  1982; 1995 X 2  . Appendectomy    . Vaginal hysterectomy  1987    A/P Repair With Cystocele and rectocele repair   . Salpingoophorectomy Bilateral 1994  . Pelvic laparoscopy  1994  . Emergency redo median sternotomy for hemiarch repair of acute type a aortic  dissection  06/05/2010  . Nasal hemorrhage control  12/27/2011    Procedure: EPISTAXIS CONTROL;  Surgeon: Ruby Cola, MD;  Location: [facility] OR;  Service: ENT;  Laterality: N/A;  . Cataract extraction w/ intraocular lens  implant, bilateral Bilateral   . Cardiac catheterization  ~ 1971; 1982  . Esophagogastroduodenoscopy (egd) with esophageal dilation  "several times"  . Tubal ligation  1969  .  Tonsillectomy and adenoidectomy  ~ 1944  . Nasal hemorrhage control  2013    [facility]at [facility]    Social History   Social History  . Marital Status: Married    Spouse Name: linwood  . Number of Children: N/A  . Years of Education: N/A   Occupational History  . retired Therapist, sports    Social History Main Topics  . Smoking status: Never Smoker   . Smokeless tobacco: Never Used  . Alcohol Use: No  . Drug Use: No  . Sexual Activity: No   Other Topics Concern  . Not on file   Social History Narrative   Lives with spouse.        Medication List       This list is accurate as of: 02/18/16  5:41 PM.  Always use your most recent med list.               acetaminophen 325 MG tablet  Commonly known as:  TYLENOL  Take 650 mg by mouth every 6 (six) hours as needed for mild pain.     amoxicillin 500 MG capsule  Commonly known as:  AMOXIL  TAKE 4 CAPSULES BY MOUTH 1 HOUR PRIOR TO DENTAL  PROCEDURES.     aspirin EC 81 MG tablet  Take 1 tablet (81 mg total) by mouth daily.     cholecalciferol 1000 units tablet  Commonly known as:  VITAMIN D  Take 1,000 Units by mouth daily.     clindamycin 300 MG capsule  Commonly known as:  CLEOCIN  Take 1 capsule (300 mg total) by mouth 3 (three) times daily. X 7 days     clorazepate 7.5 MG tablet  Commonly known as:  TRANXENE  Take 1 tablet (7.5 mg total) by mouth at bedtime as needed. for sleep     docusate sodium 50 MG capsule  Commonly known as:  COLACE  Take 50 mg by mouth 2 (two) times daily.     dofetilide 250 MCG capsule  Commonly known as:  TIKOSYN  TAKE ONE CAPSULE BY MOUTH EVERY 12 HOURS     EYE VITAMINS PO  Take 1 tablet by mouth 2 (two) times daily.     gabapentin 300 MG capsule  Commonly known as:  NEURONTIN  TAKE 2 CAPSULES BY MOUTH AT BEDTIME     glucosamine-chondroitin 500-400 MG tablet  Take 1 tablet by mouth 2 (two) times daily.     HYDROcodone-acetaminophen 5-325 MG tablet  Commonly known as:  NORCO/VICODIN  Take 1 tablet by mouth every 6 (six) hours as needed for moderate pain.     KLOR-CON M20 20 MEQ tablet  Generic drug:  potassium chloride SA  TAKE 1 TABLET (20 MEQ TOTAL) BY MOUTH DAILY.     metoprolol tartrate 25 MG tablet  Commonly known as:  LOPRESSOR  Take 12.5 mg by mouth 2 (two) times daily.     MIRALAX PO  Take 1 packet by mouth daily as needed (constipation).     omeprazole 20 MG capsule  Commonly known as:  PRILOSEC  Take 20 mg by mouth daily.     SALINE NASAL SPRAY NA  Place into the nose as needed.     sertraline 50 MG tablet  Commonly known as:  ZOLOFT  Take 1 tablet (50 mg total) by mouth daily.     spironolactone 25 MG tablet  Commonly known as:  ALDACTONE  Take 1 tablet (25 mg total) by mouth daily.  torsemide 20 MG tablet  Commonly known as:  DEMADEX  Take 2 tablets (40 mg total) by mouth daily.     traMADol 50 MG tablet  Commonly known as:  ULTRAM  Take  50 mg by mouth every 12 (twelve) hours as needed for moderate pain.     vitamin C 500 MG tablet  Commonly known as:  ASCORBIC ACID  Take 500 mg by mouth daily. Reported on 02/18/2016           Objective:   Physical Exam BP 126/74 mmHg  Pulse 62  Temp(Src) 97.9 F (36.6 C) (Oral)  Ht 5\' 6"  (1.676 m)  Wt 155 lb 6 oz (70.478 kg)  BMI 25.09 kg/m2  SpO2 97% General:   Well developed, well nourished . NAD.  HEENT:  Normocephalic . Face symmetric, atraumatic. TMs normal Nose quite congested Sinuses is slightly TTP bilaterally Throat: Symmetric, not red. Lungs:  CTA B Normal respiratory effort, no intercostal retractions, no accessory muscle use. Heart: RRR,  no murmur.  No pretibial edema bilaterally  Skin: Not pale. Not jaundice Neurologic:  alert & oriented X3.  Speech normal, gait appropriate for age and unassisted Psych--  Cognition and judgment appear intact.  Cooperative with normal attention span and concentration.  Behavior appropriate. No anxious or depressed appearing.      Assessment & Plan:   Assessment DM a1c 7.1 (2013) Peripheral neuropathy, used to see Dr. Erling Cruz Anxiety depression Fibromyalgia hyperlipidemia Venous insufficiency  CV: CAD, CABG Paroxysmal atrial fibrillation, decreasing, intolerant to anticoagulation, epistaxis Chronic diastolic heart failure Severe mitral and tricuspid regurgitation Type A aortic dissection, emergency surgery~2012 OSA   H/o abnormal chest x-ray Severe epistaxis GI: Esophageal stricture, IBS MSK: DJD, back pain (on tramadol qd, occ vicodin) GU: No hematuria, renal cyst (referred by [facility])  PLAN:  Febrile illness: 81 year old female with multiple medical problems with a febrile illness for the last several days, chest x-ray negative and WBCs not elevated @ the ER. Flu test today negative. DDX sinusitis versus viral syndrome. Will continue w/ clindamycin, otherwise conservative treatment. See  instructions. RTC within few weeks

## 2016-02-25 ENCOUNTER — Telehealth: Payer: Self-pay | Admitting: Internal Medicine

## 2016-02-25 MED ORDER — AMOXICILLIN 500 MG PO CAPS: 500.0000 mg | ORAL_CAPSULE | Freq: Three times a day (TID) | ORAL | Status: DC

## 2016-02-25 MED ORDER — AZELASTINE HCL 0.1 % NA SOLN: 2.0000 | Freq: Every day | NASAL | Status: DC | PRN

## 2016-02-25 NOTE — Telephone Encounter (Signed)
Spoke w/ Pt, informed her to d/c Clindamycin (stated she finished on Sunday, April 9) and to start Amoxicillin 500 mg 1 by mouth three times daily for 7 days and to use Astelin 2 sprays in each nostril daily until better ([facility]), instructed to call if fever, chills, N/V or not gradually improving. Pt verbalized understanding.

## 2016-02-25 NOTE — Telephone Encounter (Signed)
Dx with sinusitis, on clindamycin, partially treated, not getting better. Stop clindamycin Start amoxicillin 500 mg 1 by mouth 3 times a day for 1 week #21 no refills Astelin: 2 sprays in each side of the nose every day until better, #1 and 2 refills Office visit next week if she is not gradually improving Urgent care or ER if symptoms severe

## 2016-02-25 NOTE — Telephone Encounter (Signed)
Pt seen on 02/18/2016.

## 2016-02-25 NOTE — Telephone Encounter (Signed)
Can be reached: [phone] Pharmacy: [id number] - [place], [place].  Reason for call: pt still have face tenderness and getting gunk out. She said eyes are a little swollen. She still has cough. She said yesterday ear was hurting. Pt requesting another antibiotic.

## 2016-02-26 ENCOUNTER — Other Ambulatory Visit: Payer: Self-pay | Admitting: Urology

## 2016-02-26 ENCOUNTER — Telehealth: Payer: Self-pay | Admitting: Cardiovascular Disease

## 2016-02-26 ENCOUNTER — Telehealth: Payer: Self-pay | Admitting: Internal Medicine

## 2016-02-26 DIAGNOSIS — K8689 Other specified diseases of pancreas: Secondary | ICD-10-CM

## 2016-02-26 NOTE — Telephone Encounter (Signed)
Can be reached: [phone]  Reason for call: Pt states she had appt this morning at [facility]. They did a CT scan and informed her they found something on her pancreas. She states they are ordering an MRI to be done probably next week. She said they told her to notify PCP they are faxing over results/notes from the visit that should be reviewed.

## 2016-02-26 NOTE — Telephone Encounter (Signed)
New Message:  Pt called in wanting to speak with Lauren, she says there is something urgent she needs to tell her. Please f/u with her

## 2016-02-26 NOTE — Telephone Encounter (Signed)
New Message:   Pt is calling in to speak with Lauren, she says there is something very urgent she needs to speak with you about. Please f/u with her

## 2016-02-26 NOTE — Telephone Encounter (Signed)
Report received, placed in MD's red folder for review.

## 2016-02-26 NOTE — Telephone Encounter (Signed)
FYI  I spoke with the pt and she called the office to make Dr Burt Knack and I aware that Urology had done a CT of her abdomen and they just called her to make her aware that they found a quarter size tumor on her pancreas.  The pt is very upset by this news and had a sister pass away with pancreatic cancer.  The pt is scheduled for an MRI on 03/05/16 to further evaluate this area.

## 2016-02-26 NOTE — Telephone Encounter (Signed)
FYI

## 2016-03-01 ENCOUNTER — Telehealth: Payer: Self-pay | Admitting: Cardiovascular Disease

## 2016-03-01 NOTE — Telephone Encounter (Signed)
Thx. Agree with plan as outlined by [name]. I'm happy to discuss options with [name] after appropriate testing done.

## 2016-03-01 NOTE — Telephone Encounter (Signed)
I spoke with the pt's daughter and she said the pt has a 4cm mass in the head of her pancreas by CT.  The pt is very upset with this news and has already planned her funeral.  Lattie Haw wanted to make [place] aware that the pt will be having an MRI this Friday and they would like Dr Burt Knack to review these results. Lattie Haw requested a consult with Dr Burt Knack for the pt and family to come in and talk about the pt's options.  I made her aware that the pt is in the beginning stages of evaluation and she needs to under go the recommended testing and evaluation before "jumping" to a diagnosis.  As the pt's results become available we will be able to discuss how the pt should proceed from a cardiac stand point.

## 2016-03-01 NOTE — Telephone Encounter (Signed)
[name] is calling to speak to you about her Mother [name] ) , The Urologist found a 4cm mass on her pancreas and they value Dr. Burt Knack and your  opinion and wants to speak with him before any possible surgery . [name] is having a MRI on Friday. Please call her at her work number first [phone] then on her cell phone .

## 2016-03-01 NOTE — Telephone Encounter (Signed)
CT report reviewed, was done April 14, has a 2.72.8 cm pancreatic head mass, MRI sched for April 21. I called the patient and discuss the results, she was anxious, is counseled, will talk to her once I have the MRI report. Other findings include stable L and R  lower lobe pulmonary nodules

## 2016-03-04 ENCOUNTER — Telehealth: Payer: Self-pay | Admitting: Internal Medicine

## 2016-03-04 NOTE — Telephone Encounter (Signed)
Can be reached: [phone] Pharmacy: [id number] - [place], [place].  Reason for call: pt called in stating that she is having a lot of stomach issues. Pt said that she can barely eat. She said she is on abx as well. She is drinking milk and tea. She said that she is taking prilosec 20mg  1ud in the morning and 1ud at night. She said it feel like gerd in the middle of her stomach. Pt asking what she can do to help.

## 2016-03-04 NOTE — Telephone Encounter (Signed)
Recommend to schedule an appt to discuss w/ Dr. Larose Kells or another provider since Dr. Larose Kells has left for today.

## 2016-03-04 NOTE — Telephone Encounter (Signed)
Noted  

## 2016-03-04 NOTE — Telephone Encounter (Signed)
Pt declined appt stated maybe it's the abx or the mass on her pancreas. She'll just wait and see how it goes tonight. She is drinking mylanta in between taking prilosec.

## 2016-03-05 ENCOUNTER — Telehealth: Payer: Self-pay | Admitting: Internal Medicine

## 2016-03-05 ENCOUNTER — Ambulatory Visit (HOSPITAL_COMMUNITY)
Admission: RE | Admit: 2016-03-05 | Discharge: 2016-03-05 | Disposition: A | Discharge status: Home / Self Care | Payer: Medicare Other | Source: Ambulatory Visit | Attending: Urology | Admitting: Urology

## 2016-03-05 DIAGNOSIS — K8689 Other specified diseases of pancreas: Secondary | ICD-10-CM

## 2016-03-05 DIAGNOSIS — M899 Disorder of bone, unspecified: Secondary | ICD-10-CM | POA: Diagnosis not present

## 2016-03-05 DIAGNOSIS — K8020 Calculus of gallbladder without cholecystitis without obstruction: Secondary | ICD-10-CM | POA: Diagnosis not present

## 2016-03-05 DIAGNOSIS — Z9889 Other specified postprocedural states: Secondary | ICD-10-CM | POA: Diagnosis not present

## 2016-03-05 DIAGNOSIS — R195 Other fecal abnormalities: Secondary | ICD-10-CM | POA: Diagnosis not present

## 2016-03-05 DIAGNOSIS — I517 Cardiomegaly: Secondary | ICD-10-CM | POA: Insufficient documentation

## 2016-03-05 DIAGNOSIS — K869 Disease of pancreas, unspecified: Secondary | ICD-10-CM | POA: Insufficient documentation

## 2016-03-05 MED ORDER — GADOBENATE DIMEGLUMINE 529 MG/ML IV SOLN
15.0000 mL | Freq: Once | INTRAVENOUS | Status: AC | PRN
  Administered 2016-03-05: 14 mL via INTRAVENOUS

## 2016-03-05 NOTE — Telephone Encounter (Signed)
Noted, she has an appointment 03/08/2016.

## 2016-03-05 NOTE — Telephone Encounter (Signed)
Caller name: Lattie Haw Relationship to patient: daughter, POZ Can be reached: [phone]   Reason for call: Pts daughter called stating pt had MRI with unfavorable results and she is requesting that pt not be contacted directly via phone. They have scheduled appt with Dr. Larose Kells on 4/14 2:30pm to discuss the results and options.

## 2016-03-08 ENCOUNTER — Ambulatory Visit (INDEPENDENT_AMBULATORY_CARE_PROVIDER_SITE_OTHER): Payer: Medicare Other | Admitting: Internal Medicine

## 2016-03-08 ENCOUNTER — Telehealth: Payer: Self-pay

## 2016-03-08 ENCOUNTER — Encounter: Payer: Self-pay | Admitting: Internal Medicine

## 2016-03-08 VITALS — BP 118/72 | HR 89 | Temp 97.8°F | Resp 16 | Ht 66.0 in | Wt 151.1 lb

## 2016-03-08 DIAGNOSIS — I2510 Atherosclerotic heart disease of native coronary artery without angina pectoris: Secondary | ICD-10-CM | POA: Diagnosis not present

## 2016-03-08 DIAGNOSIS — K8689 Other specified diseases of pancreas: Secondary | ICD-10-CM

## 2016-03-08 DIAGNOSIS — K869 Disease of pancreas, unspecified: Secondary | ICD-10-CM | POA: Diagnosis not present

## 2016-03-08 DIAGNOSIS — F341 Dysthymic disorder: Secondary | ICD-10-CM | POA: Diagnosis not present

## 2016-03-08 DIAGNOSIS — Z09 Encounter for follow-up examination after completed treatment for conditions other than malignant neoplasm: Secondary | ICD-10-CM | POA: Diagnosis not present

## 2016-03-08 MED ORDER — SERTRALINE HCL 50 MG PO TABS: 100.0000 mg | ORAL_TABLET | Freq: Every day | ORAL | Status: AC

## 2016-03-08 NOTE — Telephone Encounter (Signed)
Noted. MD will discuss results w/ pt and family at visit.

## 2016-03-08 NOTE — Progress Notes (Signed)
Pre visit review using our clinic review tool, if applicable. No additional management support is needed unless otherwise documented below in the visit note/SLS  

## 2016-03-08 NOTE — Patient Instructions (Addendum)
Increase sertraline 50 mg: 1.5 tablets x 1 week Then 2 tablets every day  Continue tranxene  , ok to take a extra dose at night if needed   Pain management: Continue tylenol vicodin 1 tablet 2 or 3 times a day as needed  Ok to sporadically take 1 ultram (may interact with sertraline) Top tylenol dose no more than 3,000 mg  Next visit : 4 weeks

## 2016-03-08 NOTE — Assessment & Plan Note (Signed)
Pancreatic  mass: Incidental finding on CT done on urology, confirmed by MRI, she is having some symptoms that may be related to the mass such as postprandial abdominal pain. We had a long discussion about these findings, will of discuss with GI for the best next step, eventually we'll have to see oncology. We discussed treatment of insomnia, anxiety and pain. After a long conversation we agreed on the following: Tylenol 2 or 3 tablets at bedtime for pain Vicodin 2 or 3 tablets a day for pain Minimize the use of Ultram to a sporadic tablet as needed as it may interact with sertraline Continue with Tranxene, okay to take a second dose if needed Increase sertraline gradually 100 mg daily. Side effects of meds include drowsiness and risk of falls ---> discussed with the patient and his her family. Recommend to avoid driving Contact the daughter Lattie Haw if needed: [phone] or  [phone] Addendum: Discuss case with GI, they will contact the patient for further eval. GI prompt respond appreciated.

## 2016-03-08 NOTE — Progress Notes (Signed)
Subjective:    Patient ID: [name], female    DOB: 06-23-1935, 81 y.o.   MRN: [id number]  DOS:  03/08/2016 Type of visit - description : Acute visit, here with 5 family members Interval history: She was evaluated by GU, a pancreatic mass was found, follow-up MRI of the abdomen confirm the likely diagnosis of pancreatic cancer with metastasis. Likes to discuss next steps.    Review of Systems Of course she is stressed about the diagnosis. Appetite is okay but   having occasional epigastric pain after eating. No fever chills No nausea vomiting Had nosebleeds, status post ENT, they are better  Past Medical History  Diagnosis Date  . OSA (obstructive sleep apnea)     pt denies this  . HTN (hypertension)   . Atrial fibrillation (HCC)     paroxysmal; on coumadin  . CAD (coronary artery disease)     a. cath 7/11: LM 40%, mild plaque disease in CFX, LAD, and RCA;  b. 2011 CABG with S-LAD and S-OM done at time of Aortic dissection repair  . Chronic diastolic CHF (congestive heart failure) ([place])     a. 08/2011 Echo: EF 55-60%, PASP 25mmHg  . Dissection of aorta, thoracic ([place])     a. Type A; s/p repair 7/11 with aortic root repair and CABG x 2   . ASD (atrial septal defect)     a. s/p repair [facility]  . Right heart failure (HCC)     a. 2/2 TR and RV dysfxn;  b. echo 4/12: EF 60%, mild LVH, mild AI, mild MR, mod LAE, mod RVE with mod dec. RVSF, mod RAE, mod to severe TR, PASP 58;  c. right heart cath 4/12:  RA mean 8, RV 46/1 with mean 6, PA 45/13 with mean 26, PCWP mean 14, CO 3.68, CI 2.1 (no sig pulmon HTN)  . GERD (gastroesophageal reflux disease)   . IBS (irritable bowel syndrome)   . Esophageal stricture   . Colonic polyp   . Peripheral neuropathy ([place])   . Fibromyalgia   . Anxiety   . HLD (hyperlipidemia)   . Borderline diabetes   . History of TIAs   . Normocytic anemia   . Thrombocytopenia ([place])   . Macular degeneration   . Depression   . Esophageal  dysmotilities   . Cancer (HCC)     squamous cell CA  . Sleep apnea     pt denies OSA  . Anemia, iron deficiency 11/21/2014  . DJD (degenerative joint disease)   . Osteoarthritis     Past Surgical History  Procedure Laterality Date  . Asd repair  1982    [facility]  . Shoulder open rotator cuff repair Right 2007  . Cardioversion  1982; 1995 X 2  . Appendectomy    . Vaginal hysterectomy  1987    A/P Repair With Cystocele and rectocele repair   . Salpingoophorectomy Bilateral 1994  . Pelvic laparoscopy  1994  . Emergency redo median sternotomy for hemiarch repair of acute type a aortic  dissection  06/05/2010  . Nasal hemorrhage control  12/27/2011    Procedure: EPISTAXIS CONTROL;  Surgeon: Ruby Cola, MD;  Location: [facility] OR;  Service: ENT;  Laterality: N/A;  . Cataract extraction w/ intraocular lens  implant, bilateral Bilateral   . Cardiac catheterization  ~ 1971; 1982  . Esophagogastroduodenoscopy (egd) with esophageal dilation  "several times"  . Tubal ligation  1969  . Tonsillectomy and adenoidectomy  ~ 1944  .  Nasal hemorrhage control  2013    "at [facility]"    Social History   Social History  . Marital Status: Married    Spouse Name: linwood  . Number of Children: N/A  . Years of Education: N/A   Occupational History  . retired Therapist, sports    Social History Main Topics  . Smoking status: Never Smoker   . Smokeless tobacco: Never Used  . Alcohol Use: No  . Drug Use: No  . Sexual Activity: No   Other Topics Concern  . Not on file   Social History Narrative   Lives with spouse.        Medication List       This list is accurate as of: 03/08/16  5:52 PM.  Always use your most recent med list.               acetaminophen 325 MG tablet  Commonly known as:  TYLENOL  Take 650 mg by mouth every 6 (six) hours as needed for mild pain.     aspirin EC 81 MG tablet  Take 1 tablet (81 mg total) by mouth daily.     azelastine 0.1 % nasal spray  Commonly known as:   ASTELIN  Place 2 sprays into both nostrils daily as needed for rhinitis or allergies.     cholecalciferol 1000 units tablet  Commonly known as:  VITAMIN D  Take 1,000 Units by mouth daily.     clorazepate 7.5 MG tablet  Commonly known as:  TRANXENE  Take 1 tablet (7.5 mg total) by mouth at bedtime as needed. for sleep     docusate sodium 50 MG capsule  Commonly known as:  COLACE  Take 50 mg by mouth 2 (two) times daily.     dofetilide 250 MCG capsule  Commonly known as:  TIKOSYN  TAKE ONE CAPSULE BY MOUTH EVERY 12 HOURS     EYE VITAMINS PO  Take 1 tablet by mouth 2 (two) times daily.     gabapentin 300 MG capsule  Commonly known as:  NEURONTIN  TAKE 2 CAPSULES BY MOUTH AT BEDTIME     glucosamine-chondroitin 500-400 MG tablet  Take 1 tablet by mouth 2 (two) times daily.     HYDROcodone-acetaminophen 5-325 MG tablet  Commonly known as:  NORCO/VICODIN  Take 1 tablet by mouth every 6 (six) hours as needed for moderate pain.     KLOR-CON M20 20 MEQ tablet  Generic drug:  potassium chloride SA  TAKE 1 TABLET (20 MEQ TOTAL) BY MOUTH DAILY.     metoprolol tartrate 25 MG tablet  Commonly known as:  LOPRESSOR  Take 12.5 mg by mouth 2 (two) times daily. Can Take Up To 3 half Tablets Twice Daily, with AFib symptoms     MIRALAX PO  Take 1 packet by mouth daily as needed (constipation).     omeprazole 20 MG capsule  Commonly known as:  PRILOSEC  Take 20-40 mg by mouth daily.     OVER THE COUNTER MEDICATION  OTC Memory Booster     SALINE NASAL SPRAY NA  Place into the nose as needed.     sertraline 50 MG tablet  Commonly known as:  ZOLOFT  Take 2 tablets (100 mg total) by mouth daily.     spironolactone 25 MG tablet  Commonly known as:  ALDACTONE  Take 1 tablet (25 mg total) by mouth daily.     torsemide 20 MG tablet  Commonly known as:  DEMADEX  Take 2 tablets (40 mg total) by mouth daily.     traMADol 50 MG tablet  Commonly known as:  ULTRAM  Take 50 mg by mouth  every 12 (twelve) hours as needed for moderate pain. Can Take up to [3] Tablets Daily As Needed     vitamin C 500 MG tablet  Commonly known as:  ASCORBIC ACID  Take 500 mg by mouth daily. Reported on 03/08/2016           Objective:   Physical Exam BP 118/72 mmHg  Pulse 89  Temp(Src) 97.8 F (36.6 C) (Oral)  Resp 16  Ht 5\' 6"  (1.676 m)  Wt 151 lb 2 oz (68.55 kg)  BMI 24.40 kg/m2  SpO2 99% General:   Well developed, well nourished . NAD.  HEENT:  Normocephalic . Face symmetric, atraumatic Neurologic:  alert & oriented X3.  Speech normal, gait appropriate for age and unassisted Psych--  Cognition and judgment appear intact.  Cooperative with normal attention span and concentration.  Behavior appropriate. Tiny anxious but not depressed appearing     Assessment & Plan:   Assessment DM a1c 7.1 (2013) Peripheral neuropathy, used to see Dr. Erling Cruz Anxiety depression Fibromyalgia hyperlipidemia Venous insufficiency  CV: CAD, CABG Paroxysmal atrial fibrillation, decreasing, intolerant to anticoagulation, epistaxis Chronic diastolic heart failure Severe mitral and tricuspid regurgitation Type A aortic dissection, emergency surgery~2012 OSA   H/o abnormal chest x-ray Severe epistaxis GI: Esophageal stricture, IBS MSK: DJD, back pain (on tramadol qd, occ vicodin) GU: No hematuria, renal cyst (referred by [facility])  PLAN:  Pancreatic  mass: Incidental finding on CT done on urology, confirmed by MRI, she is having some symptoms that may be related to the mass such as postprandial abdominal pain. We had a long discussion about these findings, will of discuss with GI for the best next step, eventually we'll have to see oncology. We discussed treatment of insomnia, anxiety and pain. After a long conversation we agreed on the following: Tylenol 2 or 3 tablets at bedtime for pain Vicodin 2 or 3 tablets a day for pain Minimize the use of Ultram to a sporadic tablet as  needed as it may interact with sertraline Continue with Tranxene, okay to take a second dose if needed Increase sertraline gradually 100 mg daily. Side effects of meds include drowsiness and risk of falls ---> discussed with the patient and his her family. Recommend to avoid driving Contact the daughter Lattie Haw if needed: [phone] or  [phone] Addendum: Discuss case with GI, they will contact the patient for further eval. GI prompt respond appreciated.  Today, I spent more than 42   min with the patient: >50% of the time counseling regards MRI findings, appropriate next steps, pain and insomnia management, multiple questions asked from the patient and family. Also coordinating her care

## 2016-03-08 NOTE — Telephone Encounter (Signed)
-----   Message from [name], MD sent at 03/08/2016  4:07 PM EDT ----- Regarding: RE: Pancreatic mass  [place], I'm happy to see her. Can you let her know to expect a call from my office.  Her CBD was dilated to 31mm, probably biliary obstruction from the panc mass.  I'll plan on EUS with ERCP same time, I think we can get it done this Thursday.  Jaedin Regina, She needs EUS + ERCP this Thursday. She also needs CMET, CA 19-9 in next 1-2 days.  For pancreatic mass, biliary obstruction  Thanks   ----- Message -----    From: Colon Branch, MD    Sent: 03/08/2016   3:52 PM      To: [name], MD, Colon Branch, MD Subject: Pancreatic mass                                Linna Hoff, the patient has a newly diagnosed pancreatic mass, according to the radiology report name mass is amenable for endoscopy biopsy. Could you see the patient, should I take a different route? [facility]

## 2016-03-08 NOTE — Telephone Encounter (Signed)
Caller name: Lattie Haw Relationship to patient: daughter Can be reached: [phone]   Reason for call: She is wanting to know if there is an option for treatment. She is not sure what to expect. She is trying to prepare herself mentally. 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 She was wanting to know what to expect before the appt so she can be strong.

## 2016-03-09 ENCOUNTER — Encounter (HOSPITAL_COMMUNITY): Payer: Self-pay | Admitting: *Deleted

## 2016-03-09 ENCOUNTER — Other Ambulatory Visit: Payer: Self-pay

## 2016-03-09 ENCOUNTER — Other Ambulatory Visit (INDEPENDENT_AMBULATORY_CARE_PROVIDER_SITE_OTHER): Payer: Medicare Other

## 2016-03-09 DIAGNOSIS — K869 Disease of pancreas, unspecified: Secondary | ICD-10-CM

## 2016-03-09 DIAGNOSIS — K831 Obstruction of bile duct: Secondary | ICD-10-CM

## 2016-03-09 DIAGNOSIS — K8689 Other specified diseases of pancreas: Secondary | ICD-10-CM

## 2016-03-09 LAB — COMPREHENSIVE METABOLIC PANEL
ALBUMIN: 4.4 g/dL (ref 3.5–5.2)
ALT: 28 U/L (ref 0–35)
AST: 25 U/L (ref 0–37)
Alkaline Phosphatase: 118 U/L — ABNORMAL HIGH (ref 39–117)
BUN: 24 mg/dL — ABNORMAL HIGH (ref 6–23)
CALCIUM: 9.8 mg/dL (ref 8.4–10.5)
CHLORIDE: 100 meq/L (ref 96–112)
CO2: 31 meq/L (ref 19–32)
CREATININE: 1 mg/dL (ref 0.40–1.20)
GFR: 56.93 mL/min — AB (ref >60.00)
Glucose, Bld: 165 mg/dL — ABNORMAL HIGH (ref 70–99)
POTASSIUM: 4.3 meq/L (ref 3.5–5.1)
Sodium: 141 mEq/L (ref 135–145)
Total Bilirubin: 0.5 mg/dL (ref 0.2–1.2)
Total Protein: 8 g/dL (ref 6.0–8.3)

## 2016-03-09 LAB — CBC WITH DIFFERENTIAL/PLATELET
BASOS PCT: 0.3 % (ref 0.0–3.0)
Basophils Absolute: 0 10*3/uL (ref 0.0–0.1)
EOS ABS: 0.1 10*3/uL (ref 0.0–0.7)
Eosinophils Relative: 0.6 % (ref 0.0–5.0)
HCT: 34.3 % — ABNORMAL LOW (ref 36.0–46.0)
HEMOGLOBIN: 11.2 g/dL — AB (ref 12.0–15.0)
LYMPHS PCT: 10.5 % — AB (ref 12.0–46.0)
Lymphs Abs: 0.9 10*3/uL (ref 0.7–4.0)
MCHC: 32.7 g/dL (ref 30.0–36.0)
MCV: 95.2 fl (ref 78.0–100.0)
MONOS PCT: 5 % (ref 3.0–12.0)
Monocytes Absolute: 0.4 10*3/uL (ref 0.1–1.0)
NEUTROS ABS: 7.3 10*3/uL (ref 1.4–7.7)
Neutrophils Relative %: 83.6 % — ABNORMAL HIGH (ref 43.0–77.0)
PLATELETS: 237 10*3/uL (ref 150.0–400.0)
RBC: 3.6 Mil/uL — ABNORMAL LOW (ref 3.87–5.11)
RDW: 14.3 % (ref 11.5–15.5)
WBC: 8.8 10*3/uL (ref 4.0–10.5)

## 2016-03-09 NOTE — Telephone Encounter (Signed)
All orders and referral entered. Called [facility] and added the case for 03/11/16. Endo will call back because anethesia has to approve the case due to the length of the case.

## 2016-03-09 NOTE — Telephone Encounter (Signed)
Spoke with [name]. Aware of the time and date for the EUS/ERCP. Faxed instructions to her at [phone]. Questions invited and answered.

## 2016-03-09 NOTE — Telephone Encounter (Signed)
Another contact # below       Previous Messages     ----- Message -----   From: Colon Branch, MD   Sent: 03/08/2016  5:10 PM    To: [name], MD, Colon Branch, MD  Subject: RE: Pancreatic mass                Doristine Devoid, just in case her daughter Lattie Haw is also a good contact  [phone]  I'm letting them know  JP   ----- Message -----   From: [name], MD   Sent: 03/08/2016  4:07 PM    To: Colon Branch, MD, Barron Alvine, RN  Subject: RE: Pancreatic mass                 [place],  I'm happy to see her. Can you let her know to expect a call from my office. Her CBD was dilated to 60mm, probably biliary obstruction from the panc mass. I'll plan on EUS with ERCP same time, I think we can get it done this Thursday.   Kelci Petrella,  She needs EUS + ERCP this Thursday. She also needs CMET, CA 19-9 in next 1-2 days. For pancreatic mass, biliary obstruction   Thanks    ----- Message -----   From: Colon Branch, MD   Sent: 03/08/2016  3:52 PM    To: [name], MD, Colon Branch, MD  Subject: Pancreatic mass                  Linna Hoff, the patient has a newly diagnosed pancreatic mass, according to the radiology report name mass is amenable for endoscopy biopsy. Could you see the patient, should I take a different route?  [employer] have been added to [employer]

## 2016-03-10 ENCOUNTER — Telehealth: Payer: Self-pay | Admitting: Cardiovascular Disease

## 2016-03-10 NOTE — Telephone Encounter (Signed)
I spoke with the pt and she wanted to make [place] aware that she is having an EUS/ERCP tomorrow.  She said they may have to place a stent and if she has issues with gallstones then they will send her to a surgeon.  I made her aware that Dr Burt Knack and I are aware that she is under going further testing.  She also said that her pulse has been higher at times today 90-100 and she would like to make sure that it is okay if she takes additional Metoprolol Tartrate.  I advised her that this is okay and she should start with an additional 12.5mg  for palpitations. The pt said her pulse was normal this morning.

## 2016-03-10 NOTE — Telephone Encounter (Signed)
New Message  Pt requested to speak w/ rN about upcoming surgery.  Please call back and discuss.    Can also call [phone]

## 2016-03-10 NOTE — Telephone Encounter (Signed)
Agree. thanks

## 2016-03-11 ENCOUNTER — Ambulatory Visit (HOSPITAL_COMMUNITY): Payer: Medicare Other | Admitting: Certified Registered Nurse Anesthetist

## 2016-03-11 ENCOUNTER — Encounter (HOSPITAL_COMMUNITY)
Admission: RE | Disposition: A | Discharge status: Home / Self Care | Payer: Self-pay | Source: Ambulatory Visit | Attending: Gastroenterology

## 2016-03-11 ENCOUNTER — Ambulatory Visit (HOSPITAL_COMMUNITY)
Admission: RE | Admit: 2016-03-11 | Discharge: 2016-03-11 | Disposition: A | Discharge status: Home / Self Care | Payer: Medicare Other | Source: Ambulatory Visit | Attending: Gastroenterology | Admitting: Gastroenterology

## 2016-03-11 ENCOUNTER — Telehealth: Payer: Self-pay

## 2016-03-11 ENCOUNTER — Telehealth: Payer: Self-pay | Admitting: Internal Medicine

## 2016-03-11 ENCOUNTER — Ambulatory Visit (HOSPITAL_COMMUNITY): Payer: Medicare Other

## 2016-03-11 ENCOUNTER — Encounter (HOSPITAL_COMMUNITY): Payer: Self-pay

## 2016-03-11 DIAGNOSIS — Z8673 Personal history of transient ischemic attack (TIA), and cerebral infarction without residual deficits: Secondary | ICD-10-CM | POA: Insufficient documentation

## 2016-03-11 DIAGNOSIS — E1151 Type 2 diabetes mellitus with diabetic peripheral angiopathy without gangrene: Secondary | ICD-10-CM | POA: Diagnosis not present

## 2016-03-11 DIAGNOSIS — Z9049 Acquired absence of other specified parts of digestive tract: Secondary | ICD-10-CM | POA: Diagnosis not present

## 2016-03-11 DIAGNOSIS — I2510 Atherosclerotic heart disease of native coronary artery without angina pectoris: Secondary | ICD-10-CM | POA: Diagnosis not present

## 2016-03-11 DIAGNOSIS — M797 Fibromyalgia: Secondary | ICD-10-CM | POA: Diagnosis not present

## 2016-03-11 DIAGNOSIS — E785 Hyperlipidemia, unspecified: Secondary | ICD-10-CM | POA: Insufficient documentation

## 2016-03-11 DIAGNOSIS — Z7901 Long term (current) use of anticoagulants: Secondary | ICD-10-CM | POA: Insufficient documentation

## 2016-03-11 DIAGNOSIS — I480 Paroxysmal atrial fibrillation: Secondary | ICD-10-CM | POA: Diagnosis not present

## 2016-03-11 DIAGNOSIS — C259 Malignant neoplasm of pancreas, unspecified: Secondary | ICD-10-CM

## 2016-03-11 DIAGNOSIS — Z90710 Acquired absence of both cervix and uterus: Secondary | ICD-10-CM | POA: Insufficient documentation

## 2016-03-11 DIAGNOSIS — K838 Other specified diseases of biliary tract: Secondary | ICD-10-CM

## 2016-03-11 DIAGNOSIS — H3530 Unspecified macular degeneration: Secondary | ICD-10-CM | POA: Diagnosis not present

## 2016-03-11 DIAGNOSIS — C250 Malignant neoplasm of head of pancreas: Secondary | ICD-10-CM | POA: Diagnosis not present

## 2016-03-11 DIAGNOSIS — Z951 Presence of aortocoronary bypass graft: Secondary | ICD-10-CM | POA: Diagnosis not present

## 2016-03-11 DIAGNOSIS — I081 Rheumatic disorders of both mitral and tricuspid valves: Secondary | ICD-10-CM | POA: Insufficient documentation

## 2016-03-11 DIAGNOSIS — I110 Hypertensive heart disease with heart failure: Secondary | ICD-10-CM | POA: Diagnosis not present

## 2016-03-11 DIAGNOSIS — I5032 Chronic diastolic (congestive) heart failure: Secondary | ICD-10-CM | POA: Insufficient documentation

## 2016-03-11 DIAGNOSIS — Z79899 Other long term (current) drug therapy: Secondary | ICD-10-CM | POA: Insufficient documentation

## 2016-03-11 DIAGNOSIS — Z7982 Long term (current) use of aspirin: Secondary | ICD-10-CM | POA: Diagnosis not present

## 2016-03-11 DIAGNOSIS — K831 Obstruction of bile duct: Secondary | ICD-10-CM | POA: Insufficient documentation

## 2016-03-11 DIAGNOSIS — K219 Gastro-esophageal reflux disease without esophagitis: Secondary | ICD-10-CM | POA: Insufficient documentation

## 2016-03-11 DIAGNOSIS — F418 Other specified anxiety disorders: Secondary | ICD-10-CM | POA: Insufficient documentation

## 2016-03-11 DIAGNOSIS — I872 Venous insufficiency (chronic) (peripheral): Secondary | ICD-10-CM | POA: Diagnosis not present

## 2016-03-11 DIAGNOSIS — E1142 Type 2 diabetes mellitus with diabetic polyneuropathy: Secondary | ICD-10-CM | POA: Diagnosis not present

## 2016-03-11 DIAGNOSIS — K869 Disease of pancreas, unspecified: Secondary | ICD-10-CM | POA: Diagnosis present

## 2016-03-11 HISTORY — PX: EUS: SHX5427

## 2016-03-11 HISTORY — PX: ERCP: SHX5425

## 2016-03-11 SURGERY — UPPER ENDOSCOPIC ULTRASOUND (EUS) LINEAR
Anesthesia: General

## 2016-03-11 MED ORDER — HYDROCODONE-ACETAMINOPHEN 5-325 MG PO TABS: 1.0000 | ORAL_TABLET | Freq: Four times a day (QID) | ORAL | Status: DC | PRN

## 2016-03-11 MED ORDER — SODIUM CHLORIDE 0.9 % IV SOLN
INTRAVENOUS | Status: DC | PRN
  Administered 2016-03-11: 15:00:00 via INTRAVENOUS

## 2016-03-11 MED ORDER — FENTANYL CITRATE (PF) 100 MCG/2ML IJ SOLN
INTRAMUSCULAR | Status: DC | PRN
  Administered 2016-03-11 (×2): 25 ug via INTRAVENOUS

## 2016-03-11 MED ORDER — FENTANYL CITRATE (PF) 100 MCG/2ML IJ SOLN
INTRAMUSCULAR | Status: AC
  Filled 2016-03-11: qty 2

## 2016-03-11 MED ORDER — INDOMETHACIN 50 MG RE SUPP
RECTAL | Status: DC | PRN
Start: 2016-03-11 — End: 2016-03-11
  Administered 2016-03-11: 100 mg via RECTAL

## 2016-03-11 MED ORDER — PROPOFOL 10 MG/ML IV BOLUS
INTRAVENOUS | Status: DC | PRN
  Administered 2016-03-11: 120 mg via INTRAVENOUS

## 2016-03-11 MED ORDER — DEXAMETHASONE SODIUM PHOSPHATE 10 MG/ML IJ SOLN
INTRAMUSCULAR | Status: DC | PRN
  Administered 2016-03-11: 10 mg via INTRAVENOUS

## 2016-03-11 MED ORDER — SODIUM CHLORIDE 0.9 % IV SOLN
INTRAVENOUS | Status: DC | PRN
  Administered 2016-03-11: 30 mL

## 2016-03-11 MED ORDER — LACTATED RINGERS IV SOLN
INTRAVENOUS | Status: DC | PRN
  Administered 2016-03-11: 13:00:00 via INTRAVENOUS

## 2016-03-11 MED ORDER — EPHEDRINE SULFATE 50 MG/ML IJ SOLN
INTRAMUSCULAR | Status: DC | PRN
  Administered 2016-03-11: 5 mg via INTRAVENOUS
  Administered 2016-03-11: 10 mg via INTRAVENOUS

## 2016-03-11 MED ORDER — LIDOCAINE HCL (CARDIAC) 20 MG/ML IV SOLN
INTRAVENOUS | Status: DC | PRN
  Administered 2016-03-11: 40 mg via INTRAVENOUS

## 2016-03-11 MED ORDER — SUCCINYLCHOLINE CHLORIDE 20 MG/ML IJ SOLN
INTRAMUSCULAR | Status: DC | PRN
  Administered 2016-03-11: 100 mg via INTRAVENOUS

## 2016-03-11 MED ORDER — ONDANSETRON HCL 4 MG/2ML IJ SOLN
INTRAMUSCULAR | Status: AC
  Filled 2016-03-11: qty 2

## 2016-03-11 MED ORDER — ONDANSETRON HCL 4 MG/2ML IJ SOLN
INTRAMUSCULAR | Status: DC | PRN
  Administered 2016-03-11: 4 mg via INTRAVENOUS

## 2016-03-11 NOTE — Op Note (Signed)
[facility] Patient Name: [name] Procedure Date: 03/11/2016 MRN: [id number] Attending MD: [name] , MD Date of Birth: 09-22-1935 CSN:  Age: 81 Admit Type: Outpatient Procedure:                ERCP Indications:              Biliary dilation on Computed Tomogram Scan,                            Malignant tumor of the head of pancreas Providers:                [name], MD, Cleda Daub, RN, Ralene Bathe, Technician, Cherylynn Ridges, Technician, Arnoldo Hooker, CRNA Referring MD:             Kathlene November, MD Medicines:                General Anesthesia, indomethacin 100mg  PR Complications:            No immediate complications. Estimated blood loss:                            None Estimated Blood Loss:     Estimated blood loss: none. Procedure:                Pre-Anesthesia Assessment:                           - Prior to the procedure, a History and Physical                            was performed, and patient medications and                            allergies were reviewed. The patient's tolerance of                            previous anesthesia was also reviewed. The risks                            and benefits of the procedure and the sedation                            options and risks were discussed with the patient.                            All questions were answered, and informed consent                            was obtained. Prior Anticoagulants: The patient has  taken no previous anticoagulant or antiplatelet                            agents. ASA Grade Assessment: II - A patient with                            mild systemic disease. After reviewing the risks                            and benefits, the patient was deemed in                            satisfactory condition to undergo the procedure.                           After obtaining informed  consent, the scope was                            passed under direct vision. Throughout the                            procedure, the patient's blood pressure, pulse, and                            oxygen saturations were monitored continuously. The                            [id number]) scope was introduced through                            the mouth, and used to inject contrast into and                            used to inject contrast into the bile duct. The                            ERCP was accomplished without difficulty. The                            patient tolerated the procedure well. Scope In: Scope Out: Findings:      The scout film was normal. There was a medium to large periampullary       diverticulum that distorted the bilary anatomy somewhat. A 44 [employer]       over a .035 hydrawire was used to cannulate the bile duct and contrast       was injected. Cholangiogram revealed 1.5cm long mid-CBD stricture. The       distal most 2-3cm of the CBD was normal. The bile duct proximal to the       stricture was very dilated (CBD 56mm). One 10 mm by 6 cm covered metal       stent was placed into the common bile duct, across the mid-CBD       stricture. The stent was in good position. There appeared to be two       '  waists' of the stent after placement. The most proximal waist was from       the mid-CBD stricture. The more distal waist was from the stent passing       through the major papilla. The distal-most 1.5cm of the stent extended       into the lumen of the duodenum. There was good flow of contrast and       somewhat old looking bile through the stent following its placement. The       main pancreatic duct was cannulated with the wire and gently injected       with dye. 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 A 6cm long 2mm                            diameter  fully covered metal mesh stent was placed                            in good position across the stricture. Moderate Sedation:      N/A- Per Anesthesia Care Recommendation:           - Discharge patient to home (ambulatory).                           - My office will be arranging referrals to medical,                            surgical, radiation oncology as well as setting up                            CT scan of chest to complete the staging workup.                           - Will plan to present her case at next GI Tumor                            Board conference. Procedure Code(s):        --- Professional ---                           [phone], Endoscopic retrograde                            cholangiopancreatography (ERCP); with placement of                            endoscopic stent into biliary or pancreatic duct,                            including pre- and post-dilation and guide wire  passage, when performed, including sphincterotomy,                            when performed, each stent Diagnosis Code(s):        --- Professional ---                           C25.0, Malignant neoplasm of head of pancreas                           K83.8, Other specified diseases of biliary tract CPT copyright 2016 American Medical Association. All rights reserved. The codes documented in this report are preliminary and upon coder review may  be revised to meet current compliance requirements. [name], MD 03/11/2016 3:27:15 PM This report has been signed electronically. Number of Addenda: 0

## 2016-03-11 NOTE — Anesthesia Postprocedure Evaluation (Signed)
Anesthesia Post Note  Patient: [name]  Procedure(s) Performed: Procedure(s) (LRB): UPPER ENDOSCOPIC ULTRASOUND (EUS) LINEAR (N/A) ENDOSCOPIC RETROGRADE CHOLANGIOPANCREATOGRAPHY (ERCP) (N/A)  Patient location during evaluation: PACU Anesthesia Type: General Level of consciousness: awake and alert Pain management: pain level controlled Vital Signs Assessment: post-procedure vital signs reviewed and stable Respiratory status: spontaneous breathing, nonlabored ventilation, respiratory function stable and patient connected to nasal cannula oxygen Cardiovascular status: blood pressure returned to baseline and stable Postop Assessment: no signs of nausea or vomiting Anesthetic complications: no    Last Vitals:  Filed Vitals:   03/11/16 1610 03/11/16 1620  BP: 151/81 159/71  Pulse: 81 82  Temp:    Resp: 20 15    Last Pain: There were no vitals filed for this visit.               Yunuen Mordan L

## 2016-03-11 NOTE — Telephone Encounter (Signed)
Caller name: Self  Can be reached: [phone]     Reason for call: Refill HYDROcodone-acetaminophen (NORCO/VICODIN) 5-325 MG tablet [id number]  And  clorazepate (TRANXENE) 7.5 MG tablet [id number]

## 2016-03-11 NOTE — Anesthesia Preprocedure Evaluation (Signed)
Anesthesia Evaluation  Patient identified by MRN, date of birth, ID band Patient awake    Reviewed: Allergy & Precautions, NPO status , Patient's Chart, lab work & pertinent test results  Airway Mallampati: II  TM Distance: >3 FB Neck ROM: Full    Dental no notable dental hx.    Pulmonary neg pulmonary ROS,    Pulmonary exam normal breath sounds clear to auscultation       Cardiovascular hypertension, + CAD, + CABG and + Peripheral Vascular Disease  Normal cardiovascular exam+ dysrhythmias Atrial Fibrillation  Rhythm:Regular Rate:Normal     Neuro/Psych negative neurological ROS  negative psych ROS   GI/Hepatic negative GI ROS, Neg liver ROS,   Endo/Other  negative endocrine ROS  Renal/GU negative Renal ROS  negative genitourinary   Musculoskeletal negative musculoskeletal ROS (+)   Abdominal   Peds negative pediatric ROS (+)  Hematology negative hematology ROS (+)   Anesthesia Other Findings   Reproductive/Obstetrics negative OB ROS                             Anesthesia Physical Anesthesia Plan  ASA: III  Anesthesia Plan: General   Post-op Pain Management:    Induction: Intravenous  Airway Management Planned: Oral ETT  Additional Equipment:   Intra-op Plan:   Post-operative Plan: Extubation in OR  Informed Consent: I have reviewed the patients History and Physical, chart, labs and discussed the procedure including the risks, benefits and alternatives for the proposed anesthesia with the patient or authorized representative who has indicated his/her understanding and acceptance.   Dental advisory given  Plan Discussed with: CRNA and Surgeon  Anesthesia Plan Comments:         Anesthesia Quick Evaluation

## 2016-03-11 NOTE — Anesthesia Procedure Notes (Signed)
Procedure Name: Intubation Date/Time: 03/11/2016 1:35 PM Performed by: West Pugh Pre-anesthesia Checklist: Patient identified, Emergency Drugs available, Suction available, Patient being monitored and Timeout performed Patient Re-evaluated:Patient Re-evaluated prior to inductionOxygen Delivery Method: Circle system utilized Preoxygenation: Pre-oxygenation with 100% oxygen Intubation Type: IV induction Ventilation: Mask ventilation without difficulty Laryngoscope Size: 2 and Miller Grade View: Grade I Tube type: Oral Number of attempts: 1 Airway Equipment and Method: Stylet Placement Confirmation: ETT inserted through vocal cords under direct vision,  positive ETCO2,  CO2 detector and breath sounds checked- equal and bilateral Secured at: 21 cm Tube secured with: Tape Dental Injury: Teeth and Oropharynx as per pre-operative assessment  Comments: Smooth IV induction-- intubation AM CRNA atraumatic teeth and mouth as preop

## 2016-03-11 NOTE — Op Note (Signed)
[facility] Patient Name: [name] Procedure Date: 03/11/2016 MRN: [id number] Attending MD: [name] , MD Date of Birth: 01/04/1935 CSN:  Age: 81 Admit Type: Outpatient Procedure:                Upper EUS Indications:              Suspected mass in pancreas on CT scan Providers:                [name], MD, Cleda Daub, RN, Ralene Bathe, Technician Referring MD:             Kathlene November, MD Medicines:                General Anesthesia Complications:            No immediate complications. Estimated blood loss:                            None. Estimated Blood Loss:     Estimated blood loss: none. Procedure:                Pre-Anesthesia Assessment:                           - Prior to the procedure, a History and Physical                            was performed, and patient medications and                            allergies were reviewed. The patient's tolerance of                            previous anesthesia was also reviewed. The risks                            and benefits of the procedure and the sedation                            options and risks were discussed with the patient.                            All questions were answered, and informed consent                            was obtained. Prior Anticoagulants: The patient has                            taken no previous anticoagulant or antiplatelet                            agents. ASA Grade Assessment: II - A patient with  mild systemic disease. After reviewing the risks                            and benefits, the patient was deemed in                            satisfactory condition to undergo the procedure.                           After obtaining informed consent, the endoscope was                            passed under direct vision. Throughout the                            procedure, the patient's blood pressure,  pulse, and                            oxygen saturations were monitored continuously. The                            [id number]) scope was introduced through                            the mouth, and advanced to the second part of                            duodenum. The upper EUS was accomplished without                            difficulty. The patient tolerated the procedure                            well. Scope In: Scope Out: Findings:      Endoscopic Finding :      The examined esophagus was endoscopically normal.      The entire examined stomach was endoscopically normal.      The examined duodenum was endoscopically normal.      Endosonographic Finding :      1. An irregular mass was identified in the pancreatic head. The mass was       hypoechoic. The mass measured 24 mm in maximal cross-sectional diameter.       The endosonographic borders were poorly-defined. There was sonographic       evidence suggesting invasion into the portal vein (manifested by       abutment). The mass does not appear to involve the SMA or celiac trunk.       The mass is causing pancreatic duct and bile duct obstruction/dilation.       Fine needle aspiration for cytology was performed. Color Doppler imaging       was utilized prior to needle puncture to confirm a lack of significant       vascular structures within the needle path. Two passes were made with       the 25 gauge needle using a transduodenal approach. Some passes were       made  with a stylet. A cytotechnologist was present to evaluate the       adequacy of the specimen.      2. The pancreatic parenchyma was othewise normal.      3. Main pancreatic duct was obstructed by the mass, with resultant       upstream dilation (9mm in body)      4. CBD was obstructed by the mass, with resultant dilation (1.4cm)      5. No peripancreatic adenopathy.      6. Distended gallbladder      7. No peripancreatic adenopathy Impression:                - A 2.4cm mass was identified in the pancreatic                            head causing pancreatic duct, bile duct obstruction                            and dilation. The mass directly abuts the main                            portal vein near the confluence, suggesting                            invasion. This is clinical stage IB by EUS                            criteria. Fine needle aspiration performed and                            preliminary cytology was positive for malignancy                            (likely adenocarcinoma). Moderate Sedation:      N/A- Per Anesthesia Care Recommendation:           - Perform an ERCP today given obstructed bile duct.                           - Will plan on referrals to medical, surgical,                            radiation oncology. Will also complete staging                            workup with chest CT and will present this case at                            next weeks mutlidisciplinary GI Tumor Board. Procedure Code(s):        --- Professional ---                           [phone], Esophagogastroduodenoscopy, flexible,                            transoral; with transendoscopic ultrasound-guided  intramural or transmural fine needle                            aspiration/biopsy(s), (includes endoscopic                            ultrasound examination limited to the esophagus,                            stomach or duodenum, and adjacent structures) Diagnosis Code(s):        --- Professional ---                           K86.89, Other specified diseases of pancreas                           R93.3, Abnormal findings on diagnostic imaging of                            other parts of digestive tract CPT copyright 2016 American Medical Association. All rights reserved. The codes documented in this report are preliminary and upon coder review may  be revised to meet current compliance requirements. [name], MD 03/11/2016 2:40:48 PM This report has been signed electronically. Number of Addenda: 0

## 2016-03-11 NOTE — Interval H&P Note (Signed)
History and Physical Interval Note:  03/11/2016 12:23 PM  [name]  has presented today for surgery, with the diagnosis of pancreatic mass biliary obstruction  The various methods of treatment have been discussed with the patient and family. After consideration of risks, benefits and other options for treatment, the patient has consented to  Procedure(s): UPPER ENDOSCOPIC ULTRASOUND (EUS) LINEAR (N/A) ENDOSCOPIC RETROGRADE CHOLANGIOPANCREATOGRAPHY (ERCP) (N/A) as a surgical intervention .  The patient's history has been reviewed, patient examined, no change in status, stable for surgery.  I have reviewed the patient's chart and labs.  Questions were answered to the patient's satisfaction.     [name]

## 2016-03-11 NOTE — Discharge Instructions (Signed)
YOU HAD AN ENDOSCOPIC PROCEDURE TODAY: Refer to the procedure report that was given to you for any specific questions about what was found during the examination.  If the procedure report does not answer your questions, please call your gastroenterologist to clarify.  YOU SHOULD EXPECT: Some feelings of bloating in the abdomen. Passage of more gas than usual.  Walking can help get rid of the air that was put into your GI tract during the procedure and reduce the bloating. If you had a lower endoscopy (such as a colonoscopy or flexible sigmoidoscopy) you may notice spotting of blood in your stool or on the toilet paper.   DIET: Your first meal following the procedure should be a light meal and then it is ok to progress to your normal diet.  A half-sandwich or bowl of soup is an example of a good first meal.  Heavy or fried foods are harder to digest and may make you feel nasueas or bloated.  Drink plenty of fluids but you should avoid alcoholic beverages for 24 hours.  ACTIVITY: Your care partner should take you home directly after the procedure.  You should plan to take it easy, moving slowly for the rest of the day.  You can resume normal activity the day after the procedure however you should NOT DRIVE or use heavy machinery for 24 hours (because of the sedation medicines used during the test).    SYMPTOMS TO REPORT IMMEDIATELY  A gastroenterologist can be reached at any hour.  Please call your doctor's office for any of the following symptoms:     Following upper endoscopy (EGD, EUS, ERCP)  Vomiting of blood or coffee ground material  New, significant abdominal pain  New, significant chest pain or pain under the shoulder blades  Painful or persistently difficult swallowing  New shortness of breath  Black, tarry-looking stools  FOLLOW UP: If any biopsies were taken you will be contacted by phone or by letter within the next 1-3 weeks.  Call your gastroenterologist if you have not heard about  the biopsies in 3 weeks.  Please also call your gastroenterologist's office with any specific questions about appointments or follow up tests.   General Anesthesia, Adult, Care After Refer to this sheet in the next few weeks. These instructions provide you with information on caring for yourself after your procedure. Your health care provider may also give you more specific instructions. Your treatment has been planned according to current medical practices, but problems sometimes occur. Call your health care provider if you have any problems or questions after your procedure. WHAT TO EXPECT AFTER THE PROCEDURE After the procedure, it is typical to experience:  Sleepiness.  Nausea and vomiting. HOME CARE INSTRUCTIONS  For the first 24 hours after general anesthesia:  Have a responsible person with you.  Do not drive a car. If you are alone, do not take public transportation.  Do not drink alcohol.  Do not take medicine that has not been prescribed by your health care provider.  Do not sign important papers or make important decisions.  You may resume a normal diet and activities as directed by your health care provider.  Change bandages (dressings) as directed.  If you have questions or problems that seem related to general anesthesia, call the hospital and ask for the anesthetist or anesthesiologist on call. SEEK MEDICAL CARE IF:  You have nausea and vomiting that continue the day after anesthesia.  You develop a rash. SEEK IMMEDIATE MEDICAL CARE IF:  You have difficulty breathing.  You have chest pain.  You have any allergic problems.   This information is not intended to replace advice given to you by your health care provider. Make sure you discuss any questions you have with your health care provider.   Document Released: 02/07/2001 Document Revised: 11/22/2014 Document Reviewed: 03/01/2012 Elsevier Interactive Patient Education [employer].

## 2016-03-11 NOTE — Telephone Encounter (Signed)
signed

## 2016-03-11 NOTE — Telephone Encounter (Signed)
VM left advising Rx ready for pick up.     KP 

## 2016-03-11 NOTE — Transfer of Care (Signed)
Immediate Anesthesia Transfer of Care Note  Patient: [name]  Procedure(s) Performed: Procedure(s): UPPER ENDOSCOPIC ULTRASOUND (EUS) LINEAR (N/A) ENDOSCOPIC RETROGRADE CHOLANGIOPANCREATOGRAPHY (ERCP) (N/A)  Patient Location: PACU  Anesthesia Type:General  Level of Consciousness:  sedated, patient cooperative and responds to stimulation  Airway & Oxygen Therapy:Patient Spontanous Breathing and Patient connected to face mask oxgen  Post-op Assessment:  Report given to PACU RN and Post -op Vital signs reviewed and stable  Post vital signs:  Reviewed and stable  Last Vitals:  Filed Vitals:   03/11/16 1227  BP: 134/56  Temp: 36.7 C  Resp: 11    Complications: No apparent anesthesia complications

## 2016-03-11 NOTE — Telephone Encounter (Signed)
Hydrocodone  Last seen 03/08/16 and filled 11/30/15 #30 No UDS   Please advise    KP

## 2016-03-11 NOTE — H&P (View-Only) (Signed)
Subjective:    Patient ID: [name], female    DOB: April 28, 1935, 81 y.o.   MRN: [id number]  DOS:  03/08/2016 Type of visit - description : Acute visit, here with 5 family members Interval history: She was evaluated by GU, a pancreatic mass was found, follow-up MRI of the abdomen confirm the likely diagnosis of pancreatic cancer with metastasis. Likes to discuss next steps.    Review of Systems Of course she is stressed about the diagnosis. Appetite is okay but   having occasional epigastric pain after eating. No fever chills No nausea vomiting Had nosebleeds, status post ENT, they are better  Past Medical History  Diagnosis Date  . OSA (obstructive sleep apnea)     pt denies this  . HTN (hypertension)   . Atrial fibrillation (HCC)     paroxysmal; on coumadin  . CAD (coronary artery disease)     a. cath 7/11: LM 40%, mild plaque disease in CFX, LAD, and RCA;  b. 2011 CABG with S-LAD and S-OM done at time of Aortic dissection repair  . Chronic diastolic CHF (congestive heart failure) ([place])     a. 08/2011 Echo: EF 55-60%, PASP 26mmHg  . Dissection of aorta, thoracic ([place])     a. Type A; s/p repair 7/11 with aortic root repair and CABG x 2   . ASD (atrial septal defect)     a. s/p repair [facility] at [facility]  . Right heart failure (HCC)     a. 2/2 TR and RV dysfxn;  b. echo 4/12: EF 60%, mild LVH, mild AI, mild MR, mod LAE, mod RVE with mod dec. RVSF, mod RAE, mod to severe TR, PASP 58;  c. right heart cath 4/12:  RA mean 8, RV 46/1 with mean 6, PA 45/13 with mean 26, PCWP mean 14, CO 3.68, CI 2.1 (no sig pulmon HTN)  . GERD (gastroesophageal reflux disease)   . IBS (irritable bowel syndrome)   . Esophageal stricture   . Colonic polyp   . Peripheral neuropathy ([place])   . Fibromyalgia   . Anxiety   . HLD (hyperlipidemia)   . Borderline diabetes   . History of TIAs   . Normocytic anemia   . Thrombocytopenia ([place])   . Macular degeneration   . Depression   . Esophageal  dysmotilities   . Cancer (HCC)     squamous cell CA  . Sleep apnea     pt denies OSA  . Anemia, iron deficiency 11/21/2014  . DJD (degenerative joint disease)   . Osteoarthritis     Past Surgical History  Procedure Laterality Date  . Asd repair  1982    [facility]  . Shoulder open rotator cuff repair Right 2007  . Cardioversion  1982; 1995 X 2  . Appendectomy    . Vaginal hysterectomy  1987    A/P Repair With Cystocele and rectocele repair   . Salpingoophorectomy Bilateral 1994  . Pelvic laparoscopy  1994  . Emergency redo median sternotomy for hemiarch repair of acute type a aortic  dissection  06/05/2010  . Nasal hemorrhage control  12/27/2011    Procedure: EPISTAXIS CONTROL;  Surgeon: Ruby Cola, MD;  Location: [facility] OR;  Service: ENT;  Laterality: N/A;  . Cataract extraction w/ intraocular lens  implant, bilateral Bilateral   . Cardiac catheterization  ~ 1971; 1982  . Esophagogastroduodenoscopy (egd) with esophageal dilation  "several times"  . Tubal ligation  1969  . Tonsillectomy and adenoidectomy  ~ 1944  .  Nasal hemorrhage control  2013    "at [facility]"    Social History   Social History  . Marital Status: Married    Spouse Name: linwood  . Number of Children: N/A  . Years of Education: N/A   Occupational History  . retired Therapist, sports    Social History Main Topics  . Smoking status: Never Smoker   . Smokeless tobacco: Never Used  . Alcohol Use: No  . Drug Use: No  . Sexual Activity: No   Other Topics Concern  . Not on file   Social History Narrative   Lives with spouse.        Medication List       This list is accurate as of: 03/08/16  5:52 PM.  Always use your most recent med list.               acetaminophen 325 MG tablet  Commonly known as:  TYLENOL  Take 650 mg by mouth every 6 (six) hours as needed for mild pain.     aspirin EC 81 MG tablet  Take 1 tablet (81 mg total) by mouth daily.     azelastine 0.1 % nasal spray  Commonly known as:   ASTELIN  Place 2 sprays into both nostrils daily as needed for rhinitis or allergies.     cholecalciferol 1000 units tablet  Commonly known as:  VITAMIN D  Take 1,000 Units by mouth daily.     clorazepate 7.5 MG tablet  Commonly known as:  TRANXENE  Take 1 tablet (7.5 mg total) by mouth at bedtime as needed. for sleep     docusate sodium 50 MG capsule  Commonly known as:  COLACE  Take 50 mg by mouth 2 (two) times daily.     dofetilide 250 MCG capsule  Commonly known as:  TIKOSYN  TAKE ONE CAPSULE BY MOUTH EVERY 12 HOURS     EYE VITAMINS PO  Take 1 tablet by mouth 2 (two) times daily.     gabapentin 300 MG capsule  Commonly known as:  NEURONTIN  TAKE 2 CAPSULES BY MOUTH AT BEDTIME     glucosamine-chondroitin 500-400 MG tablet  Take 1 tablet by mouth 2 (two) times daily.     HYDROcodone-acetaminophen 5-325 MG tablet  Commonly known as:  NORCO/VICODIN  Take 1 tablet by mouth every 6 (six) hours as needed for moderate pain.     KLOR-CON M20 20 MEQ tablet  Generic drug:  potassium chloride SA  TAKE 1 TABLET (20 MEQ TOTAL) BY MOUTH DAILY.     metoprolol tartrate 25 MG tablet  Commonly known as:  LOPRESSOR  Take 12.5 mg by mouth 2 (two) times daily. Can Take Up To 3 half Tablets Twice Daily, with AFib symptoms     MIRALAX PO  Take 1 packet by mouth daily as needed (constipation).     omeprazole 20 MG capsule  Commonly known as:  PRILOSEC  Take 20-40 mg by mouth daily.     OVER THE COUNTER MEDICATION  OTC Memory Booster     SALINE NASAL SPRAY NA  Place into the nose as needed.     sertraline 50 MG tablet  Commonly known as:  ZOLOFT  Take 2 tablets (100 mg total) by mouth daily.     spironolactone 25 MG tablet  Commonly known as:  ALDACTONE  Take 1 tablet (25 mg total) by mouth daily.     torsemide 20 MG tablet  Commonly known as:  DEMADEX  Take 2 tablets (40 mg total) by mouth daily.     traMADol 50 MG tablet  Commonly known as:  ULTRAM  Take 50 mg by mouth  every 12 (twelve) hours as needed for moderate pain. Can Take up to [3] Tablets Daily As Needed     vitamin C 500 MG tablet  Commonly known as:  ASCORBIC ACID  Take 500 mg by mouth daily. Reported on 03/08/2016           Objective:   Physical Exam BP 118/72 mmHg  Pulse 89  Temp(Src) 97.8 F (36.6 C) (Oral)  Resp 16  Ht 5\' 6"  (1.676 m)  Wt 151 lb 2 oz (68.55 kg)  BMI 24.40 kg/m2  SpO2 99% General:   Well developed, well nourished . NAD.  HEENT:  Normocephalic . Face symmetric, atraumatic Neurologic:  alert & oriented X3.  Speech normal, gait appropriate for age and unassisted Psych--  Cognition and judgment appear intact.  Cooperative with normal attention span and concentration.  Behavior appropriate. Tiny anxious but not depressed appearing     Assessment & Plan:   Assessment DM a1c 7.1 (2013) Peripheral neuropathy, used to see Dr. Erling Cruz Anxiety depression Fibromyalgia hyperlipidemia Venous insufficiency  CV: CAD, CABG Paroxysmal atrial fibrillation, decreasing, intolerant to anticoagulation, epistaxis Chronic diastolic heart failure Severe mitral and tricuspid regurgitation Type A aortic dissection, emergency surgery~2012 OSA   H/o abnormal chest x-ray Severe epistaxis GI: Esophageal stricture, IBS MSK: DJD, back pain (on tramadol qd, occ vicodin) GU: No hematuria, renal cyst (referred by [place])  PLAN:  Pancreatic  mass: Incidental finding on CT done on urology, confirmed by MRI, she is having some symptoms that may be related to the mass such as postprandial abdominal pain. We had a long discussion about these findings, will of discuss with GI for the best next step, eventually we'll have to see oncology. We discussed treatment of insomnia, anxiety and pain. After a long conversation we agreed on the following: Tylenol 2 or 3 tablets at bedtime for pain Vicodin 2 or 3 tablets a day for pain Minimize the use of Ultram to a sporadic tablet as  needed as it may interact with sertraline Continue with Tranxene, okay to take a second dose if needed Increase sertraline gradually 100 mg daily. Side effects of meds include drowsiness and risk of falls ---> discussed with the patient and his her family. Recommend to avoid driving Contact the daughter Lattie Haw if needed: [phone] or  [phone] Addendum: Discuss case with GI, they will contact the patient for further eval. GI prompt respond appreciated.  Today, I spent more than 42   min with the patient: >50% of the time counseling regards MRI findings, appropriate next steps, pain and insomnia management, multiple questions asked from the patient and family. Also coordinating her care

## 2016-03-11 NOTE — Telephone Encounter (Signed)
-----   Message from [name], MD sent at 03/11/2016  3:29 PM EDT ----- Jacqulyn Bath, See todays EUS and ERCP reports. This is a pancreatic adenocarcinoma, seems to involve portal vein and so is probably not a surgical candidate (at least up front).  I stented the bile duct because I'm sure she would be getting jaundiced in next 1-2 or 3 weeks (CBD was 1.4cm).  I"m setting her up with all appropriate referrals. Thanks  Novi Calia, She needs referrals to medical, surgical, radiation oncology for newly diagnosed pancreatic adenocarcinoma (involves portal vein).  She also needs to complete staging workup with chest CT with IV contrast.  [name], Can you help with the above? She probably is a good candidate for [facility] next week if there is still room. Can you put her on for next week GI cancer conference as well. Thanks  DJ

## 2016-03-12 ENCOUNTER — Other Ambulatory Visit: Payer: Self-pay | Admitting: Internal Medicine

## 2016-03-12 ENCOUNTER — Other Ambulatory Visit: Payer: Self-pay | Admitting: *Deleted

## 2016-03-12 DIAGNOSIS — C250 Malignant neoplasm of head of pancreas: Secondary | ICD-10-CM

## 2016-03-12 MED ORDER — CLORAZEPATE DIPOTASSIUM 7.5 MG PO TABS: 7.5000 mg | ORAL_TABLET | Freq: Every evening | ORAL | Status: AC | PRN

## 2016-03-12 NOTE — Telephone Encounter (Signed)
[name] called to say the CT has been scheduled for 03/16/16 at 2:30 pm at [facility] NPO 4 hours arrive 2:15 pm.  Pt has been instructed and also the referrals for med, rad and surgical onc have been placed.

## 2016-03-12 NOTE — Progress Notes (Signed)
Family request a RF on clorazepate, 1-2 prn, #45, 1 RF

## 2016-03-13 ENCOUNTER — Encounter (HOSPITAL_COMMUNITY): Payer: Self-pay | Admitting: Gastroenterology

## 2016-03-14 ENCOUNTER — Telehealth: Payer: Self-pay | Admitting: Internal Medicine

## 2016-03-14 ENCOUNTER — Emergency Department (HOSPITAL_COMMUNITY)
Admission: EM | Admit: 2016-03-14 | Discharge: 2016-03-14 | Disposition: A | Discharge status: Home / Self Care | Payer: Medicare Other | Attending: Emergency Medicine | Admitting: Emergency Medicine

## 2016-03-14 ENCOUNTER — Emergency Department (HOSPITAL_COMMUNITY): Payer: Medicare Other

## 2016-03-14 ENCOUNTER — Encounter (HOSPITAL_COMMUNITY): Payer: Self-pay | Admitting: Emergency Medicine

## 2016-03-14 DIAGNOSIS — Z8673 Personal history of transient ischemic attack (TIA), and cerebral infarction without residual deficits: Secondary | ICD-10-CM | POA: Insufficient documentation

## 2016-03-14 DIAGNOSIS — Z793 Long term (current) use of hormonal contraceptives: Secondary | ICD-10-CM | POA: Diagnosis not present

## 2016-03-14 DIAGNOSIS — I5032 Chronic diastolic (congestive) heart failure: Secondary | ICD-10-CM | POA: Diagnosis not present

## 2016-03-14 DIAGNOSIS — Z85828 Personal history of other malignant neoplasm of skin: Secondary | ICD-10-CM | POA: Diagnosis not present

## 2016-03-14 DIAGNOSIS — D696 Thrombocytopenia, unspecified: Secondary | ICD-10-CM | POA: Insufficient documentation

## 2016-03-14 DIAGNOSIS — I110 Hypertensive heart disease with heart failure: Secondary | ICD-10-CM | POA: Insufficient documentation

## 2016-03-14 DIAGNOSIS — Z79899 Other long term (current) drug therapy: Secondary | ICD-10-CM | POA: Insufficient documentation

## 2016-03-14 DIAGNOSIS — Z7982 Long term (current) use of aspirin: Secondary | ICD-10-CM | POA: Insufficient documentation

## 2016-03-14 DIAGNOSIS — I480 Paroxysmal atrial fibrillation: Secondary | ICD-10-CM | POA: Insufficient documentation

## 2016-03-14 DIAGNOSIS — K219 Gastro-esophageal reflux disease without esophagitis: Secondary | ICD-10-CM | POA: Diagnosis not present

## 2016-03-14 DIAGNOSIS — Z792 Long term (current) use of antibiotics: Secondary | ICD-10-CM | POA: Diagnosis not present

## 2016-03-14 DIAGNOSIS — R1010 Upper abdominal pain, unspecified: Secondary | ICD-10-CM | POA: Diagnosis present

## 2016-03-14 DIAGNOSIS — M1990 Unspecified osteoarthritis, unspecified site: Secondary | ICD-10-CM | POA: Insufficient documentation

## 2016-03-14 DIAGNOSIS — I2510 Atherosclerotic heart disease of native coronary artery without angina pectoris: Secondary | ICD-10-CM | POA: Insufficient documentation

## 2016-03-14 DIAGNOSIS — Z79891 Long term (current) use of opiate analgesic: Secondary | ICD-10-CM | POA: Diagnosis not present

## 2016-03-14 DIAGNOSIS — F329 Major depressive disorder, single episode, unspecified: Secondary | ICD-10-CM | POA: Insufficient documentation

## 2016-03-14 DIAGNOSIS — C259 Malignant neoplasm of pancreas, unspecified: Secondary | ICD-10-CM | POA: Diagnosis not present

## 2016-03-14 DIAGNOSIS — G629 Polyneuropathy, unspecified: Secondary | ICD-10-CM | POA: Diagnosis not present

## 2016-03-14 DIAGNOSIS — E785 Hyperlipidemia, unspecified: Secondary | ICD-10-CM | POA: Diagnosis not present

## 2016-03-14 LAB — URINALYSIS, ROUTINE W REFLEX MICROSCOPIC
BILIRUBIN URINE: NEGATIVE
Glucose, UA: NEGATIVE mg/dL
Hgb urine dipstick: NEGATIVE
KETONES UR: NEGATIVE mg/dL
LEUKOCYTES UA: NEGATIVE
NITRITE: NEGATIVE
PROTEIN: NEGATIVE mg/dL
Specific Gravity, Urine: 1.009 (ref 1.005–1.030)
pH: 6.5 (ref 5.0–8.0)

## 2016-03-14 LAB — COMPREHENSIVE METABOLIC PANEL
ALK PHOS: 79 U/L (ref 38–126)
ALT: 12 U/L — ABNORMAL LOW (ref 14–54)
AST: 13 U/L — AB (ref 15–41)
Albumin: 3.8 g/dL (ref 3.5–5.0)
Anion gap: 11 (ref 5–15)
BILIRUBIN TOTAL: 1.3 mg/dL — AB (ref 0.3–1.2)
BUN: 15 mg/dL (ref 6–20)
CALCIUM: 9 mg/dL (ref 8.9–10.3)
CO2: 27 mmol/L (ref 22–32)
Chloride: 102 mmol/L (ref 101–111)
Creatinine, Ser: 0.77 mg/dL (ref 0.44–1.00)
GFR calc Af Amer: >60 mL/min (ref >60)
Glucose, Bld: 162 mg/dL — ABNORMAL HIGH (ref 65–99)
POTASSIUM: 3.6 mmol/L (ref 3.5–5.1)
Sodium: 140 mmol/L (ref 135–145)
TOTAL PROTEIN: 7.4 g/dL (ref 6.5–8.1)

## 2016-03-14 LAB — CBC WITH DIFFERENTIAL/PLATELET
BASOS ABS: 0 10*3/uL (ref 0.0–0.1)
BASOS PCT: 0 %
EOS ABS: 0.1 10*3/uL (ref 0.0–0.7)
EOS PCT: 1 %
HCT: 32.7 % — ABNORMAL LOW (ref 36.0–46.0)
Hemoglobin: 10.5 g/dL — ABNORMAL LOW (ref 12.0–15.0)
Lymphocytes Relative: 10 %
Lymphs Abs: 0.8 10*3/uL (ref 0.7–4.0)
MCH: 30.8 pg (ref 26.0–34.0)
MCHC: 32.1 g/dL (ref 30.0–36.0)
MCV: 95.9 fL (ref 78.0–100.0)
MONO ABS: 0.9 10*3/uL (ref 0.1–1.0)
MONOS PCT: 11 %
NEUTROS ABS: 6.5 10*3/uL (ref 1.7–7.7)
Neutrophils Relative %: 78 %
PLATELETS: 164 10*3/uL (ref 150–400)
RBC: 3.41 MIL/uL — ABNORMAL LOW (ref 3.87–5.11)
RDW: 13.5 % (ref 11.5–15.5)
WBC: 8.2 10*3/uL (ref 4.0–10.5)

## 2016-03-14 LAB — I-STAT TROPONIN, ED: Troponin i, poc: 0.01 ng/mL (ref 0.00–0.08)

## 2016-03-14 LAB — LIPASE, BLOOD: LIPASE: 87 U/L — AB (ref 11–51)

## 2016-03-14 MED ORDER — OXYCODONE HCL 5 MG PO TABS: 5.0000 mg | ORAL_TABLET | ORAL | Status: DC | PRN

## 2016-03-14 MED ORDER — DIPHENHYDRAMINE HCL 25 MG PO CAPS: 25.0000 mg | ORAL_CAPSULE | Freq: Four times a day (QID) | ORAL | Status: DC | PRN

## 2016-03-14 MED ORDER — ONDANSETRON HCL 4 MG/2ML IJ SOLN
4.0000 mg | Freq: Once | INTRAMUSCULAR | Status: AC
  Administered 2016-03-14: 4 mg via INTRAVENOUS
  Filled 2016-03-14: qty 2

## 2016-03-14 MED ORDER — SODIUM CHLORIDE 0.9 % IV BOLUS (SEPSIS)
1000.0000 mL | Freq: Once | INTRAVENOUS | Status: AC
  Administered 2016-03-14: 1000 mL via INTRAVENOUS

## 2016-03-14 MED ORDER — HYDROMORPHONE HCL 1 MG/ML IJ SOLN
0.5000 mg | INTRAMUSCULAR | Status: DC | PRN
  Administered 2016-03-14: 0.5 mg via INTRAVENOUS
  Filled 2016-03-14: qty 1

## 2016-03-14 MED ORDER — FENTANYL 12 MCG/HR TD PT72: 12.5000 ug | MEDICATED_PATCH | TRANSDERMAL | Status: DC

## 2016-03-14 MED ORDER — SODIUM CHLORIDE 0.9 % IV SOLN
INTRAVENOUS | Status: DC
  Administered 2016-03-14: 1000 mL via INTRAVENOUS

## 2016-03-14 MED ORDER — IOPAMIDOL (ISOVUE-300) INJECTION 61%
80.0000 mL | Freq: Once | INTRAVENOUS | Status: AC | PRN
  Administered 2016-03-14: 80 mL via INTRAVENOUS

## 2016-03-14 NOTE — Discharge Instructions (Signed)
Pancreatic Cancer  Pancreatic cancer is an abnormal growth of tissue (tumor) in the pancreas that is cancerous (malignant). Unlike noncancerous (benign) tumors, malignant tumors can spread to other parts of your body. The pancreas is a gland located deep in the abdomen, between the stomach and the spine. The pancreas makes insulin and other hormones. These hormones help the body use or store the energy that comes from food. The pancreas also makes pancreatic juices. These juices contain enzymes that help digest food. Most pancreatic cancers begin in the ducts that carry pancreatic juices. When cancer of the pancreas spreads (metastasizes) outside the pancreas, cancer cells are often found in nearby lymph nodes. If the cancer has reached these nodes, it means that cancer cells may have spread to other lymph nodes or other tissues, such as the liver or lungs. Sometimes cancer of the pancreas spreads to the peritoneum. This is the tissue that lines the abdomen. CAUSES  The exact cause of pancreatic cancer is unknown.  RISK FACTORS There are a number of risk factors that can increase your chances of getting pancreatic cancer. They include:  Age. The likelihood of developing pancreatic cancer increases with age. Most pancreatic cancers occur in people older than 60 years.  Smoking. Cigarette smokers are 2 to 3 times more likely than nonsmokers to develop pancreatic cancer.  Diabetes, especially if you were diagnosed as an adult.  Being female.  Being African American.  Family history. The risk for developing pancreatic cancer triples if a person's mother, father, sister, or brother had the disease. Also, a family history of colon cancer or ovarian cancer increases the risk of pancreatic cancer.  Chronic pancreatitis. Chronic pancreatitis is a painful condition of the pancreas.  Exposure to certain chemicals in the workplace.  Being obese or eating a diet high in fat and red meat. SYMPTOMS    Pancreatic cancer is sometimes called a "silent disease." This is because early pancreatic cancer often does not cause symptoms. As the cancer grows, symptoms may include:  Weakness.  Abdominal pain.  Diarrhea.  Depression.  Loss of appetite.  Indigestion.  Pain in the upper abdomen or upper back.  Nausea and vomiting.  Yellowing of the skin or eyes (jaundice).  Back pain.  Weight loss.  Fatigue.  Clay-colored stools.  Unexplained blood clots.  Dark urine. DIAGNOSIS  Your caregiver will ask about your medical history. He or she may also perform a number of procedures, such as:  A physical exam. Your skin and eyes will be examined for signs of jaundice. The abdomen will be checked for changes in the area near the pancreas, liver, and gallbladder. Your caregiver will also check for an abnormal buildup of fluid in the abdomen (ascites).  Lab tests. Your caregiver may take blood and urine samples to check for bilirubin and other substances. Bilirubin is a substance that passes from the liver to the intestine through the gallbladder and bile duct. If the bile duct is blocked by a tumor, the bilirubin cannot pass through normally. Blockage of the bile duct may cause the level of bilirubin in the blood and urine to become very high.  Computed tomography (CT). An X-ray machine linked to a computer takes a series of detailed pictures of the pancreas and other organs and blood vessels in the abdomen.  Ultrasonography. The ultrasound device uses sound waves to produce pictures of the pancreas and other organs inside the abdomen.  Endoscopic retrograde cholangiopancreatography. A lighted tube (endoscope) is passed through the patient's   mouth and stomach, down into the small intestine. Then a smaller tube (catheter) is inserted through the endoscope into the bile ducts and pancreatic ducts. A dye is injected through the catheter into the ducts and X-rays are taken. The X-rays can show  whether the ducts are narrowed or blocked by a tumor.  Endoscopic ultrasonography. An endoscope is passed through the patient's mouth and stomach, down into the small intestine. An ultrasound device is placed down the endoscope to produce pictures of the area, including the pancreas.  Percutaneous transhepatic cholangiography. A dye is injected through a thin needle into the liver and X-rays are taken. Unless there is a blockage, the dye should move freely through the bile ducts. From the X-rays, your caregiver can tell whether there is a blockage from a tumor.  Taking a tissue sample (biopsy) from the pancreas. The sample is examined under a microscope to look for cancer cells. Your cancer will be staged according to its severity and extent. Staging is a careful attempt to categorize your cancer to help determine which treatment will be most appropriate. Factors involved in staging include the size of the tumor, whether the cancer has spread, and if so, to what parts of the body it has spread. You may need to have more tests to determine the stage of your cancer. The test results will help determine what treatment plan is best for you. STAGES   Stage I. The cancer is only found in the pancreas.  Stage II. The cancer has spread to nearby tissues and possibly to the lymph nodes, but not to the blood vessels.  Stage III. The cancer is surrounding the major blood vessels beside the pancreas and has possibly spread to the lymph nodes.  Stage IV. The cancer has spread to other parts of the body, such as the liver, lungs, or peritoneum. TREATMENT  Treatment generally begins within several weeks after the diagnosis. There will be time to talk with your caregiver about treatment choices, get a second opinion, and learn more about the disease. Your caregiver may refer you to a cancer specialist (oncologist).  Cancer of the pancreas is very hard to control with current treatments. For that reason, many  caregivers encourage patients with this disease to consider taking part in a clinical trial. Clinical trials are an important option for people with all stages of pancreatic cancer.  At this time, pancreatic cancer can be cured only when it is found at an early stage, before it has spread. However, other treatments may be able to control the disease and help patients live longer and feel better. When a cure or control of the disease is not possible, some patients choose palliative therapy. Palliative therapy aims to improve quality of life by controlling pain and other problems caused by this disease, but it does not cure the disease. Depending on the type and stage, pancreatic cancer may be treated with surgery, radiation therapy, or chemotherapy. Some patients have a combination of these therapies.  Surgery may be done to remove all or part of the pancreas. Sometimes the cancer cannot be completely removed. However, if the tumor is blocking the common bile duct or duodenum, the surgeon can place a mesh tube (stent) in the blocked area. This helps keep the duct or duodenum open.  Radiation therapy uses high-energy rays to kill cancer cells.  Chemotherapy is the use of drugs to kill cancer cells. Caregivers also give chemotherapy to help reduce pain and other problems caused by pancreatic   cancer. HOME CARE INSTRUCTIONS   Only take over-the-counter or prescription medicines for pain, discomfort, or fever as directed by your caregiver.  Maintain a healthy diet.  Consider joining a support group. This may help you learn to cope with the stress of having pancreatic cancer.  Seek advice to help you manage treatment side effects.  Keep all follow-up appointments as directed by your caregiver. SEEK IMMEDIATE MEDICAL CARE IF:   You have a sudden increase in pain.  Your skin or eyes turn more yellow.  You lose weight without trying.  You have a fever, especially during chemotherapy treatment or  after stent placement.  You notice new fatigue or weakness.   This information is not intended to replace advice given to you by your health care provider. Make sure you discuss any questions you have with your health care provider.   Document Released: 10/16/2004 Document Revised: 11/22/2014 Document Reviewed: 11/26/2011 Else[employer] Interactive Patient Education 2016 [employer] 

## 2016-03-14 NOTE — ED Notes (Signed)
Pt recently had a gall bladder stent placed Thursday. Has had uncontrolled pain since then despite pain medication. Also has hx of pancreatic cancer. Not on chemo. Pt also requests chest CT, that is scheduled on Tuesday.

## 2016-03-14 NOTE — Telephone Encounter (Signed)
Patient had EUS/FNA followed by ERCP w/ covered metal biliary stent placement 4-27 for pancreatic cancer. Patient called me last evening with complaints of abdominal pain which she felt was secondary to no BM in days. Miralax recommended. Daughter calls back this am stating that Miralax worked nicely, but her mom is still with pain that is not improved with PO narcotics. Advised to go to ER for more comprehensive evaluation and pain management. She agreed and was going to [facility].

## 2016-03-14 NOTE — ED Notes (Signed)
Awake. Verbally responsive. A/O x4. Resp even and unlabored. No audible adventitious breath sounds noted. ABC's intact.  

## 2016-03-14 NOTE — ED Provider Notes (Signed)
CSN: [id number]     Arrival date & time 03/14/16  1054 History   First MD Initiated Contact with Patient 03/14/16 1112     Chief Complaint  Patient presents with  . Abdominal Pain   HPI The patient has recently been diagnosed with pancreatic cancer. This was confirmed on an abdominal pelvic CT. This past Thursday she had an endoscopy and biliary stent placed. The patient states since that time she has been having persistent and increasing pain in her upper abdomen that goes to her back. He also has been having discomfort that radiated up towards her chest this morning. She felt like she was being smothered. She has had low-grade temperatures off and on for the past month. She denies any recent coughing. She is not currently having any trouble with chest pain or shortness of breath. She has tried taking Vicodin for her abdominal pain but has been having to take it every 4 hours. She is not able to get comfortable. She called the GI doctor suggested she come to the emergency room. The patient is also scheduled to have a chest CT this Tuesday for further characterization of her malignancy family requests if they can get that done while she is here. Past Medical History  Diagnosis Date  . OSA (obstructive sleep apnea)     pt denies this  . HTN (hypertension)   . Atrial fibrillation (HCC)     paroxysmal; on coumadin  . CAD (coronary artery disease)     a. cath 7/11: LM 40%, mild plaque disease in CFX, LAD, and RCA;  b. 2011 CABG with S-LAD and S-OM done at time of Aortic dissection repair  . Chronic diastolic CHF (congestive heart failure) ([place])     a. 08/2011 Echo: EF 55-60%, PASP 19mmHg  . Dissection of aorta, thoracic ([place])     a. Type A; s/p repair 7/11 with aortic root repair and CABG x 2   . ASD (atrial septal defect)     a. s/p repair 1982 at [facility]  . Right heart failure (HCC)     a. 2/2 TR and RV dysfxn;  b. echo 4/12: EF 60%, mild LVH, mild AI, mild MR, mod LAE, mod RVE with mod dec. RVSF,  mod RAE, mod to severe TR, PASP 58;  c. right heart cath 4/12:  RA mean 8, RV 46/1 with mean 6, PA 45/13 with mean 26, PCWP mean 14, CO 3.68, CI 2.1 (no sig pulmon HTN)  . GERD (gastroesophageal reflux disease)   . IBS (irritable bowel syndrome)   . Esophageal stricture   . Colonic polyp   . Peripheral neuropathy ([place])   . Fibromyalgia   . Anxiety   . HLD (hyperlipidemia)   . Borderline diabetes   . History of TIAs   . Normocytic anemia   . Thrombocytopenia ([place])   . Macular degeneration   . Depression   . Esophageal dysmotilities   . Cancer (HCC)     squamous cell CA  . Sleep apnea     pt denies OSA  . Anemia, iron deficiency 11/21/2014  . DJD (degenerative joint disease)   . Osteoarthritis    Past Surgical History  Procedure Laterality Date  . Asd repair  1982    [facility]  . Shoulder open rotator cuff repair Right 2007  . Cardioversion  1982; 1995 X 2  . Appendectomy    . Vaginal hysterectomy  1987    A/P Repair With Cystocele and rectocele repair   .  Salpingoophorectomy Bilateral 1994  . Pelvic laparoscopy  1994  . Emergency redo median sternotomy for hemiarch repair of acute type a aortic  dissection  06/05/2010  . Nasal hemorrhage control  12/27/2011    Procedure: EPISTAXIS CONTROL;  Surgeon: Ruby Cola, MD;  Location: [facility] OR;  Service: ENT;  Laterality: N/A;  . Cataract extraction w/ intraocular lens  implant, bilateral Bilateral   . Cardiac catheterization  ~ 1971; 1982  . Esophagogastroduodenoscopy (egd) with esophageal dilation  "several times"  . Tubal ligation  1969  . Tonsillectomy and adenoidectomy  ~ 1944  . Nasal hemorrhage control  2013    "at [facility]"  . Eus N/A 03/11/2016    Procedure: UPPER ENDOSCOPIC ULTRASOUND (EUS) LINEAR;  Surgeon: [name], MD;  Location: [facility] ENDOSCOPY;  Service: Endoscopy;  Laterality: N/A;  . Ercp N/A 03/11/2016    Procedure: ENDOSCOPIC RETROGRADE CHOLANGIOPANCREATOGRAPHY (ERCP);  Surgeon: [name], MD;  Location: [facility];  Service: Endoscopy;  Laterality: N/A;   Family History  Problem Relation Age of Onset  . Pancreatic cancer Sister   . Osteoporosis Sister   . Lung cancer Brother     x 2  . Heart attack Brother   . Melanoma Sister   . Diabetes Mother   . Hypertension Mother   . ALS Mother   . Colon polyps Sister   . Heart disease Brother     x 2  . Esophageal cancer Neg Hx   . Colon cancer Neg Hx   . Rectal cancer Neg Hx   . Stomach cancer Neg Hx    Social History  Substance Use Topics  . Smoking status: Never Smoker   . Smokeless tobacco: Never Used  . Alcohol Use: No   OB History    Gravida Para Term Preterm AB TAB SAB Ectopic Multiple Living   4 3 3  1     3      Review of Systems  All other systems reviewed and are negative.     Allergies  Amiodarone; Crestor; Ropinirole hydrochloride; Atorvastatin; Augmentin; Codeine; Erythromycin; Ezetimibe; Meperidine hcl; Morphine; Neomycin-bacitracin zn-polymyx; and Simvastatin  Home Medications   Prior to Admission medications   Medication Sig Start Date End Date Taking? Authorizing Provider  acetaminophen (TYLENOL) 500 MG tablet Take 1,000 mg by mouth every 6 (six) hours as needed (For pain.).    Yes Historical Provider, MD  aspirin EC 81 MG tablet Take 81 mg by mouth every evening.  05/11/13  Yes Sherren Mocha, MD  azelastine (ASTELIN) 0.1 % nasal spray Place 2 sprays into both nostrils daily as needed for rhinitis or allergies. 02/25/16  Yes Colon Branch, MD  cholecalciferol (VITAMIN D) 1000 UNITS tablet Take 1,000 Units by mouth daily.    Yes Historical Provider, MD  clorazepate (TRANXENE) 7.5 MG tablet Take 1-2 tablets (7.5-15 mg total) by mouth at bedtime as needed. for sleep 03/12/16  Yes Colon Branch, MD  docusate sodium (COLACE) 50 MG capsule Take 50 mg by mouth 2 (two) times daily.   Yes Historical Provider, MD  dofetilide (TIKOSYN) 250 MCG capsule TAKE ONE CAPSULE BY MOUTH EVERY 12 HOURS Patient taking differently: Take  250 mcg by mouth every 12 (twelve) hours.  01/21/16  Yes Sherren Mocha, MD  doxycycline (VIBRA-TABS) 100 MG tablet Take 100 mg by mouth 2 (two) times daily.   Yes Historical Provider, MD  gabapentin (NEURONTIN) 300 MG capsule TAKE 2 CAPSULES BY MOUTH AT BEDTIME 11/18/15  Yes  Biagio Borg, MD  glucosamine-chondroitin 500-400 MG tablet Take 1 tablet by mouth 2 (two) times daily.   Yes Historical Provider, MD  KLOR-CON M20 20 MEQ tablet TAKE 1 TABLET (20 MEQ TOTAL) BY MOUTH DAILY. 07/07/15  Yes Biagio Borg, MD  metoprolol tartrate (LOPRESSOR) 25 MG tablet Take 12.5 mg by mouth 2 (two) times daily. Can Take Up To 3 half Tablets Twice Daily, with AFib symptoms   Yes Historical Provider, MD  Multiple Vitamins-Minerals (EYE VITAMINS PO) Take 1 tablet by mouth 2 (two) times daily.    Yes Historical Provider, MD  omeprazole (PRILOSEC) 20 MG capsule Take 20 mg by mouth daily.    Yes Historical Provider, MD  OVER THE COUNTER MEDICATION Take 1 tablet by mouth daily. OTC Memory Booster   Yes Historical Provider, MD  Polyethyl Glycol-Propyl Glycol (SYSTANE) 0.4-0.3 % SOLN Place 2 drops into both eyes daily as needed (For dry eyes.).   Yes Historical Provider, MD  Polyethylene Glycol 3350 (MIRALAX PO) Take 1 packet by mouth daily as needed (constipation).    Yes Historical Provider, MD  SALINE NASAL SPRAY NA Place 1 spray into the nose as needed (For congestion.).    Yes Historical Provider, MD  sertraline (ZOLOFT) 50 MG tablet Take 2 tablets (100 mg total) by mouth daily. 03/08/16  Yes Colon Branch, MD  spironolactone (ALDACTONE) 25 MG tablet Take 1 tablet (25 mg total) by mouth daily. 02/16/16  Yes Sherren Mocha, MD  torsemide (DEMADEX) 20 MG tablet Take 2 tablets (40 mg total) by mouth daily. 12/03/14  Yes Sherren Mocha, MD  vitamin C (ASCORBIC ACID) 500 MG tablet Take 500 mg by mouth daily. Reported on 03/08/2016   Yes Historical Provider, MD  fentaNYL (DURAGESIC) 12 MCG/HR Place 1 patch (12.5 mcg total) onto the skin  every 3 (three) days. 03/14/16   Dorie Rank, MD  oxyCODONE (ROXICODONE) 5 MG immediate release tablet Take 1 tablet (5 mg total) by mouth every 4 (four) hours as needed for severe pain. 03/14/16   Dorie Rank, MD   BP 104/70 mmHg  Pulse 95  Temp(Src) 99.8 F (37.7 C) (Oral)  Resp 18  SpO2 93% Physical Exam  Constitutional: No distress.  HENT:  Head: Normocephalic and atraumatic.  Right Ear: External ear normal.  Left Ear: External ear normal.  Eyes: Conjunctivae are normal. Right eye exhibits no discharge. Left eye exhibits no discharge. No scleral icterus.  Neck: Neck supple. No tracheal deviation present.  Cardiovascular: Normal rate, regular rhythm and intact distal pulses.   Pulmonary/Chest: Effort normal and breath sounds normal. No stridor. No respiratory distress. She has no wheezes. She has no rales.  Abdominal: Soft. Bowel sounds are normal. She exhibits no distension. There is tenderness in the epigastric area. There is no rigidity, no rebound and no guarding. No hernia.  Musculoskeletal: She exhibits no edema or tenderness.  Neurological: She is alert. She has normal strength. No cranial nerve deficit (no facial droop, extraocular movements intact, no slurred speech) or sensory deficit. She exhibits normal muscle tone. She displays no seizure activity. Coordination normal.  Skin: Skin is warm and dry. No rash noted. She is not diaphoretic.  Psychiatric: She has a normal mood and affect.  Nursing note and vitals reviewed.   ED Course  Procedures (including critical care time) Labs Review Labs Reviewed  COMPREHENSIVE METABOLIC PANEL - Abnormal; Notable for the following:    Glucose, Bld 162 (*)    AST 13 (*)  ALT 12 (*)    Total Bilirubin 1.3 (*)    All other components within normal limits  LIPASE, BLOOD - Abnormal; Notable for the following:    Lipase 87 (*)    All other components within normal limits  CBC WITH DIFFERENTIAL/PLATELET - Abnormal; Notable for the  following:    RBC 3.41 (*)    Hemoglobin 10.5 (*)    HCT 32.7 (*)    All other components within normal limits  URINALYSIS, ROUTINE W REFLEX MICROSCOPIC (NOT AT [facility])  I-STAT TROPOININ, ED    Imaging Review Ct Chest W Contrast  03/14/2016  CLINICAL DATA:  Recent gallbladder stent placement. Uncontrolled pain since procedure. Also with history of pancreatic cancer. Chest CT obtained for staging purposes. EXAM: CT CHEST WITH CONTRAST TECHNIQUE: Multidetector CT imaging of the chest was performed during intravenous contrast administration. CONTRAST:  83mL ISOVUE-300 IOPAMIDOL (ISOVUE-300) INJECTION 61% COMPARISON:  Chest CT angiogram dated 05/14/2015. FINDINGS: Mediastinum/Lymph Nodes: Again noted are surgical changes about the ascending thoracic aorta, unchanged. Thoracic aorta is stable in caliber and configuration throughout. No acute aortic abnormality seen. 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 The dissection flap again extends into the right subclavian artery. Extent of the dissection is unchanged. The focal irregularity/ outpouching within the right axillary artery appears stable, with adjacent surgical clips presumably related to previous surgical repair or cannulation. No new vascular abnormality. Cardiomegaly is stable. No pericardial effusion. Coronary artery calcifications again noted. Again noted are prominent pulmonary arteries bilaterally indicating pulmonary artery hypertension. No mass or enlarged lymph nodes seen within the mediastinum or perihilar regions. Esophagus is unremarkable. Thyroid gland appears normal. Lungs/Pleura: There is a stable benign 6 mm pulmonary nodule within the right lower lobe. There is mild atelectasis at each lung base. Stable tiny nodule noted within the left upper lobe. No new lung findings. 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 Upper abdomen: Pneumobilia within the upper liver, incompletely imaged, compatible with the given history of recent stent placement. Limited images of the upper abdomen are otherwise unremarkable. Musculoskeletal: Mild degenerative change throughout the slightly scoliotic thoracolumbar spine. No acute or suspicious osseous lesions seen. Superficial soft tissues are unremarkable. IMPRESSION: 1. Overall, no acute findings. 2. Stable appearance of the surgical graft repair at the level of the ascending thoracic aorta. 3. 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 Stable extension of this chronic dissection flap to the right subclavian artery. No change in overall extent. Stable focal aneurysmal outpouching within the right axillary artery. No acute-appearing vascular abnormality seen. 4. Stable cardiomegaly.  No pericardial effusion. 5. Enlarged pulmonary arteries, compatible with sequela of pulmonary artery hypertension. 6. 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 7. Small pulmonary nodules are stable, again most likely sequela of remote infectious/inflammatory or granulomatous process. No new lung findings. 8. No mediastinal lymphadenopathy. Electronically Signed   By: Franki Cabot M.D.   On: 03/14/2016 14:27   Dg Chest Portable 1 View  03/14/2016  CLINICAL DATA:  Upper abdominal pain, shortness of breath, chest pain and  nausea. EXAM: PORTABLE CHEST 1 VIEW COMPARISON:  Chest x-ray dated 02/15/2016. FINDINGS: Marked cardiomegaly is stable. Overall  cardiomediastinal silhouette is stable in size and configuration. Median sternotomy wires appear intact and stable in alignment. No evidence of active CHF at this time. No evidence of pneumonia. No pleural effusion or pneumothorax seen. IMPRESSION: No active disease.  Stable cardiomegaly. Electronically Signed   By: Franki Cabot M.D.   On: 03/14/2016 12:11   Dg Abd Acute W/chest  03/14/2016  CLINICAL DATA:  Abdominal pain.  Recent ERCP. EXAM: DG ABDOMEN ACUTE COMPARISON:  02/26/2016 FINDINGS: There is no evidence of dilated bowel loops or free intraperitoneal air. Common bile duct stent is identified within the right abdomen. Pneumobilia is identified. There is no evidence of dilated bowel loops or free intraperitoneal air. IMPRESSION: Interval placement of common bile duct stent. Pneumobilia is present confirming biliary patency. Electronically Signed   By: Kerby Moors M.D.   On: 03/14/2016 12:32   I have personally reviewed and evaluated these images and lab results as part of my medical decision-making.    MDM   Final diagnoses:  Malignant neoplasm of pancreas, unspecified location of malignancy [facility])    Patient's laboratory tests are reassuring. She does have a slight increase in her lipase but I do not think this is indicative of acute pancreatitis.  CT scan does not show evidence of pulmonary lesions.    Patient's pain improved with IV Dilaudid. It was transient hypotension but this has improved. We discussed pain management at home. I think the patient would benefit from a fentanyl patch considering how frequently she was taking her pain medications. I will also give her a prescription for oxycodone for breakthrough pain. This gives her the option of taking over-the-counter Tylenol as well.    Dorie Rank, MD 03/14/16 1540

## 2016-03-15 ENCOUNTER — Telehealth: Payer: Self-pay | Admitting: *Deleted

## 2016-03-15 ENCOUNTER — Telehealth: Payer: Self-pay | Admitting: Gastroenterology

## 2016-03-15 NOTE — Telephone Encounter (Signed)
Oncology Nurse Navigator Documentation  Oncology Nurse Navigator Flowsheets 03/15/2016  Navigator Location [facility]  Navigator Encounter Type Introductory phone call  Abnormal Finding Date 02/26/2016  Confirmed Diagnosis Date 03/11/2016  Spoke with patient and provided new patient appointment for 03/23/16 at 1:45/2:00 with Dr. Ma Rings. Informed of location of [place], valet service, and registration process. Reminded to bring insurance cards and a current medication list, including supplements. Patient verbalizes understanding. She expressed that she thought she was being seen this Friday in clinic seeing all the doctors. Informed her that the [facility] is full and the surgeon she was to see in consult is out of town on 03/19/16. Informed her that her case will be discuss in GI Conference on Wednesday with all the physicians present for input. Also notified her that the CT chest was cancelled since she had it on Sunday in hospital ER. She requested this RN call her daughter about this. Called daughter and left VM w/appointment for 03/23/16 and that GI Clinic was full this week as well as surgeon not available, and CT chest was cancelled. Left navigator direct # to call to discuss further if she needed to.

## 2016-03-15 NOTE — Telephone Encounter (Signed)
Daughter called this am to report that patient was in the ED over the weekend and the CT chest was already performed.  She is asking if we can cancel the scan scheduled for 03/23/16.  I advised daughter that I will communicate with [name], Oncology navigator about cancelling CT and arranging other referrals for Oncology.

## 2016-03-16 ENCOUNTER — Ambulatory Visit: Payer: Medicare Other | Admitting: Internal Medicine

## 2016-03-16 ENCOUNTER — Encounter: Payer: Medicare Other | Admitting: Internal Medicine

## 2016-03-16 ENCOUNTER — Ambulatory Visit (HOSPITAL_COMMUNITY): Payer: Medicare Other

## 2016-03-18 ENCOUNTER — Telehealth: Payer: Self-pay | Admitting: *Deleted

## 2016-03-18 NOTE — Telephone Encounter (Signed)
Oncology Nurse Navigator Documentation  Oncology Nurse Navigator Flowsheets 03/18/2016  Navigator Location [facility]  Navigator Encounter Type Telephone  Telephone Incoming Call;Appt Confirmation/Clarification  Abnormal Finding Date -  Confirmed Diagnosis Date -  Time Spent with Patient 15  Spoke w/daughter, Deatra Ina and answered her questions about what will occur in the appointment with Dr. Benay Spice on 03/23/16 at 2 pm.

## 2016-03-19 ENCOUNTER — Telehealth: Payer: Self-pay | Admitting: Gastroenterology

## 2016-03-19 ENCOUNTER — Other Ambulatory Visit: Payer: Self-pay

## 2016-03-19 MED ORDER — OXYCODONE HCL 5 MG PO TABS: ORAL_TABLET | ORAL | Status: DC

## 2016-03-19 NOTE — Telephone Encounter (Signed)
i prefer she take oxycodone 5mg  IR.  One pill every 3-4 hours as needed for pain.  Disp 50.  She can continue the fentanyl patch that was prescribed by ER recently

## 2016-03-19 NOTE — Telephone Encounter (Signed)
I have not called the patient about this yet. What are your thoughts on pain management?

## 2016-03-19 NOTE — Telephone Encounter (Signed)
Spoke with the patient. Unsuccessful in reaching her daughter. Discussed pain control. She will have her son Jeanell Sparrow come and pick up the Rx. She doesn't need it yet, but will drop it off at her pharmacy.

## 2016-03-19 NOTE — Telephone Encounter (Signed)
Daughter called in regarding this. New # is [phone]

## 2016-03-23 ENCOUNTER — Telehealth: Payer: Self-pay | Admitting: Oncology

## 2016-03-23 ENCOUNTER — Encounter: Payer: Self-pay | Admitting: *Deleted

## 2016-03-23 ENCOUNTER — Encounter: Payer: Self-pay | Admitting: Oncology

## 2016-03-23 ENCOUNTER — Other Ambulatory Visit: Payer: Self-pay | Admitting: *Deleted

## 2016-03-23 ENCOUNTER — Ambulatory Visit (HOSPITAL_BASED_OUTPATIENT_CLINIC_OR_DEPARTMENT_OTHER): Payer: Medicare Other | Admitting: Oncology

## 2016-03-23 VITALS — BP 111/56 | HR 72 | Temp 98.1°F | Resp 18 | Ht 66.0 in | Wt 149.4 lb

## 2016-03-23 DIAGNOSIS — M899 Disorder of bone, unspecified: Secondary | ICD-10-CM | POA: Diagnosis not present

## 2016-03-23 DIAGNOSIS — R630 Anorexia: Secondary | ICD-10-CM | POA: Diagnosis not present

## 2016-03-23 DIAGNOSIS — C250 Malignant neoplasm of head of pancreas: Secondary | ICD-10-CM

## 2016-03-23 DIAGNOSIS — G893 Neoplasm related pain (acute) (chronic): Secondary | ICD-10-CM | POA: Diagnosis not present

## 2016-03-23 DIAGNOSIS — R634 Abnormal weight loss: Secondary | ICD-10-CM

## 2016-03-23 MED ORDER — FENTANYL 25 MCG/HR TD PT72: 25.0000 ug | MEDICATED_PATCH | TRANSDERMAL | Status: DC

## 2016-03-23 NOTE — Telephone Encounter (Signed)
Gave pt appt & avs °

## 2016-03-23 NOTE — Telephone Encounter (Signed)
appt made and avs printed. Biopsy to be sched by central radiology

## 2016-03-23 NOTE — Progress Notes (Signed)
Oncology Nurse Navigator Documentation  Oncology Nurse Navigator Flowsheets 03/23/2016  Navigator Location [facility]  Navigator Encounter Type Initial MedOnc  Telephone -  Abnormal Finding Date -  Confirmed Diagnosis Date -  Patient Visit Type MedOnc;Initial  Treatment Phase Pre-Tx/Tx Discussion  Barriers/Navigation Needs Education;Family concerns  Education Actor Options;Coping with Diagnosis/ Prognosis;Pain/ Symptom Management;Newly Diagnosed Cancer Education;Preparing for Upcoming biopsy  Support Groups/Services GI Support Group;[place] for personal Child psychotherapist;Other--[place]  Acuity Level 2  Time Spent with Patient 60  Met with patient, husband, daughter Lattie Haw w/son-in-law , and a son during new patient visit. Explained the role of the GI Nurse Navigator and provided New Patient Packet with information on: 1. Pancreas cancer--reading sheets reviewed on CA 19-9 test, anatomy of pancreas 2. Support groups 3. Advanced Directives 4. Fall Safety Plan--use walker in home and may benefit from knee brace and home PT when she wants it. Answered questions, reviewed current treatment plan using TEACH back and provided emotional support. Provided copy of current treatment plan. Dempsey has #3 grown children-one son lives with them. 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 Up till 6 weeks ago she was walking the indoor track in her church daily. She reports a poor appetite and 10 lb weight loss over past year. She is very weak and requires standby assistance for safe ambulation and stressed this to family today. Discussed increase strength in Duragesic and may increase oxycodone frequency (has only taken it 2-3 times/day thus far). Reminded her to keep bowels moving with increased narcotic dose.   [name], RN, BSN GI Oncology [place]

## 2016-03-23 NOTE — Patient Instructions (Signed)
   Care Plan Summary- 03/23/2016 Name:  [name]       DOB:  Dec 12, 1934 Your Medical Team: Medical Oncologist:  Dr. Ma Rings Radiation Oncologist:  Surgeon:    Type of Cancer: Adenocarcinoma of Pancreas  Stage/Grade: undetermined *Exact staging of your cancer is based on size of the tumor, depth of invasion, involvement of lymph nodes or not, and whether or not the cancer has spread beyond the primary site   Recommendations: Based on information available as of today's consult. Recommendations may change depending on the results of further tests or exams. 1) Interventional radiology biopsy of sacral bone-determine cancer type 2) Increase pain patch to 25 mcg every 72 hours  3) IF stage IV: comfort supportive care vs. chemotherapy (25 % chance to reduce tumor by half) Next Steps: 1) Radiology will call with the biopsy appointment 2) Return to Dr. Benay Spice 03/31/16 at 0930 to discuss results and plan 3) Use your walker for ambulation; brace for knee would be reasonable to try ______________________________________________________________________________   Questions? [name], RN, BSN at [phone]. [name] is your Oncology Nurse Navigator and is available to assist you while you're receiving your medical care at [facility].

## 2016-03-23 NOTE — Progress Notes (Signed)
[name]   Referring MD: Colon Branch, Md [address], [place] [address]   [name] 81 y.o.  1934-12-21    Reason for Referral:  Pancreas cancer   HPI:  Ms. Shelburn we'll seen at the [facility] on 02/26/2016  Left flank and abdominal pain. A CT revealed intrahepatic and extrahepatic biliary ductal dilatation. The gallbladder was distended. A bile duct is dilated to the level of the pancreas. A pancreatic head mass was noted with abutment of the superior mesenteric vein and confluence of the portal vein and superior mesenteric vein. No retroperitoneal lymphadenopathy. No bone lesions. Lower lobe pulmonary nodules were stable back to May 2013.  She was referred for a pancreas protocol MRI on 03/05/2016. No focal liver lesion. Biliary tract dilatation was again noted. An ill-defined pancreas head mass measures 3 x 3.2 cm. Tumor involves approximately 180 of the superior mesenteric vein. No retroperitoneal adenopathy. No ascites. No evidence of omental or peritoneal disease. Multiple foci of T2 hyperintense enhancing lesions consistent with osseous metastases. This includes lesions at the lower thoracic spine and left sacrum.    she was referred to Dr. Ardis Hughs and underwent an ERCP on 03/11/2016. A metal stent was placed in the common bile duct across the mid bile duct stricture. An endoscopic ultrasound revealed an irregular mass in the head of the pancreas there was evidence of portal vein invasion. A fine-needle aspirate was performed. No  Peripancreatic adenopathy.   the cytology [id number] ) confirmed malignant cells consistent with adenocarcinoma.   she complains of pain in the mid abdomen and mid lower back. The pain is partially relieved with a Duragesic patch and Dilaudid.  Past Medical History  Diagnosis Date  . OSA (obstructive sleep apnea)     pt denies this  . HTN (hypertension)   . Atrial fibrillation  (HCC)     paroxysmal; on coumadin  . CAD (coronary artery disease)     a. cath 7/11: LM 40%, mild plaque disease in CFX, LAD, and RCA;  b. 2011 CABG with S-LAD and S-OM done at time of Aortic dissection repair  . Chronic diastolic CHF (congestive heart failure) ([place])     a. 08/2011 Echo: EF 55-60%, PASP 45mmHg  . Dissection of aorta, thoracic ([place])     a. Type A; s/p repair 7/11 with aortic root repair and CABG x 2   . ASD (atrial septal defect)     a. s/p repair [facility] at [facility]  . Right heart failure (HCC)     a. 2/2 TR and RV dysfxn;  b. echo 4/12: EF 60%, mild LVH, mild AI, mild MR, mod LAE, mod RVE with mod dec. RVSF, mod RAE, mod to severe TR, PASP 58;  c. right heart cath 4/12:  RA mean 8, RV 46/1 with mean 6, PA 45/13 with mean 26, PCWP mean 14, CO 3.68, CI 2.1 (no sig pulmon HTN)  . GERD (gastroesophageal reflux disease)   . IBS (irritable bowel syndrome)   . Esophageal stricture   . Colonic polyp   . Peripheral neuropathy ([place])   . Fibromyalgia   . Anxiety   . HLD (hyperlipidemia)   . Borderline diabetes   . History of TIAs   . Normocytic anemia   .  history ofThrombocytopenia ([place])   . Macular degeneration   . Depression   . Esophageal dysmotilities   . Cancer (HCC)     squamous cell  CA  .  G4 P3, 1 miscarriage       . Anemia, iron deficiency 11/21/2014  . DJD (degenerative joint disease)   . Osteoarthritis     Past Surgical History  Procedure Laterality Date  . Asd repair  1982    [facility]  . Shoulder open rotator cuff repair Right 2007  . Cardioversion  1982; 1995 X 2  . Appendectomy    . Vaginal hysterectomy  1987    A/P Repair With Cystocele and rectocele repair   . Salpingoophorectomy Bilateral 1994  . Pelvic laparoscopy  1994  . Emergency redo median sternotomy for hemiarch repair of acute type a aortic  dissection  06/05/2010  . Nasal hemorrhage control  12/27/2011    Procedure: EPISTAXIS CONTROL;  Surgeon: Ruby Cola, MD;  Location: [facility] OR;  Service: ENT;   Laterality: N/A;  . Cataract extraction w/ intraocular lens  implant, bilateral Bilateral   . Cardiac catheterization  ~ 1971; 1982  . Esophagogastroduodenoscopy (egd) with esophageal dilation  "several times"  . Tubal ligation  1969  . Tonsillectomy and adenoidectomy  ~ 1944  . Nasal hemorrhage control  2013    "at [facility]"  . Eus N/A 03/11/2016    Procedure: UPPER ENDOSCOPIC ULTRASOUND (EUS) LINEAR;  Surgeon: [name], MD;  Location: [facility] ENDOSCOPY;  Service: Endoscopy;  Laterality: N/A;  . Ercp N/A 03/11/2016    Procedure: ENDOSCOPIC RETROGRADE CHOLANGIOPANCREATOGRAPHY (ERCP);  Surgeon: [name], MD;  Location: [facility];  Service: Endoscopy;  Laterality: N/A;    Medications: Reviewed  Allergies:  Allergies  Allergen Reactions  . Amiodarone Other (See Comments)    Severe side effects per Pt--  . Crestor [Rosuvastatin] Other (See Comments)    Muscle weakness  . Ropinirole Hydrochloride Other (See Comments)    REACTION: INTOL to Requip w/ sleep paralysis  . Atorvastatin Rash    REACTION: muscle pain  . Augmentin [Amoxicillin-Pot Clavulanate] Other (See Comments)    Epitaxis; Gastrointestinal issues  . Codeine Itching    REACTION: itching  . Erythromycin Rash  . Ezetimibe Rash    REACTION: INTOL to Zetia w/ cough  . Meperidine Hcl Other (See Comments)    REACTION: dizziness  . Morphine Rash and Other (See Comments)    Pt states it "makes her crazy"  . Neomycin-Bacitracin Zn-Polymyx Rash and Other (See Comments)    blisters  . Simvastatin Rash    REACTION: unable to walk--muscle pain    Family history:  A brother had small cell lung cancer. Another brother had adenocarcinoma the lung and bladder cancer. A niece had lung cancer. They were all smokers.  Her sister died of pancreas cancer at age 84. A sister had metastatic melanoma.  Social History:    she [place] in [place]. She previously worked as a Equities trader. She does not use cigarettes  or alcohol. She was transfused with red blood cells with the atrial septal defect repair in 1982.  History  Alcohol Use No    History  Smoking status  . Never Smoker   Smokeless tobacco  . Never Used      ROS:   Positives include: anorexia, 10 pound weight loss, intermittent regurgitation of water, right knee pain, pain in the mid abdomen Ann back, left chest discomfort  A complete ROS was otherwise negative.  Physical Exam:  Blood pressure 111/56, pulse 72, temperature 98.1 F (36.7 C), temperature source Oral, resp. rate 18, height 5\' 6"  (1.676 m), weight 149  lb 6.4 oz (67.767 kg), SpO2 97 %.  HEENT:  Oropharynx without visible mass, neck without mass Lungs:  Clear bilaterally Cardiac:  Regular rate and rhythm Abdomen:  No hepatosplenomegaly, no mass, no apparent ascites, mild tenderness in the left abdomen  Vascular:  No leg edema  Lymph nodes:  No cervical, supraclavicular, axillary, or inguinal nodes Neurologic:  Alert and oriented, the motor exam appears intact in the upper and lower extremities Skin:  No rash , chronic stasis change at the lower leg bilaterally Musculoskeletal:  No spine tenderness  Breasts: bilateral breast without mass   LAB:  CBC  Lab Results  Component Value Date   WBC 8.2 03/14/2016   HGB 10.5* 03/14/2016   HCT 32.7* 03/14/2016   MCV 95.9 03/14/2016   PLT 164 03/14/2016   NEUTROABS 6.5 03/14/2016     CMP      Component Value Date/Time   NA 140 03/14/2016 1136   K 3.6 03/14/2016 1136   CL 102 03/14/2016 1136   CO2 27 03/14/2016 1136   GLUCOSE 162* 03/14/2016 1136   BUN 15 03/14/2016 1136   CREATININE 0.77 03/14/2016 1136   CREATININE 0.84 01/23/2016 1135   CALCIUM 9.0 03/14/2016 1136   PROT 7.4 03/14/2016 1136   ALBUMIN 3.8 03/14/2016 1136   AST 13* 03/14/2016 1136   ALT 12* 03/14/2016 1136   ALKPHOS 79 03/14/2016 1136   BILITOT 1.3* 03/14/2016 1136   GFRNONAA >60 03/14/2016 1136   GFRAA >60 03/14/2016 1136     Imaging:   CT abdomen/pelvis 02/26/2016 and MRI abdomen 03/05/2016-reviewed   Assessment/Plan:   1.  adenocarcinoma the pancreas, pancreas head  Mass, status post an EUS biopsy on 03/11/2016 confirming adenocarcinoma   EUS consistent with stage IB disease, 2.4 cm pancreas mass with abutment of the main portal vein   MRI abdomen 03/05/2016 with multiple T2 hyperintense enhancing lesions consistent with bone metastases   CT chest 03/14/2016- no evidence of metastatic disease  2.      Anorexia/weight loss  3.       Pain secondary to #1  4.       History of coronary artery disease   Disposition:    Ms. Reuber has been diagnosed with pancreas cancer. She appears to have bone metastases. She will be referred for a diagnostic biopsy of the left sacral mass.   I reviewed the CT/MRI images with Ms. Briscoe and her family. We discussed the diagnosis of pancreas cancer and treatment options. She understands no therapy will be curative. She has pain secondary to the primary pancreas tumor. We increased the Duragesic patch to 25 g. She will continue oxycodone for breakthrough pain.   Ms. Drinkard will return for an office visit and further discussion after the sacral mass biopsy. We will discuss comfort care versus a trial of systemic therapy. We can consider palliative radiation if the bone biopsy is negative.   approximately 50 minutes were spent with the patient today. The majority of the time was used for counseling and coordination of care.  Betsy Coder, MD  03/23/2016, 2:33 PM

## 2016-03-26 ENCOUNTER — Encounter (HOSPITAL_COMMUNITY): Payer: Self-pay | Admitting: Emergency Medicine

## 2016-03-26 ENCOUNTER — Emergency Department (HOSPITAL_COMMUNITY)
Admission: EM | Admit: 2016-03-26 | Discharge: 2016-03-27 | Disposition: A | Discharge status: Home / Self Care | Payer: Medicare Other | Attending: Emergency Medicine | Admitting: Emergency Medicine

## 2016-03-26 ENCOUNTER — Encounter: Payer: Self-pay | Admitting: *Deleted

## 2016-03-26 ENCOUNTER — Other Ambulatory Visit: Payer: Self-pay | Admitting: Radiology

## 2016-03-26 ENCOUNTER — Telehealth: Payer: Self-pay | Admitting: Oncology

## 2016-03-26 ENCOUNTER — Other Ambulatory Visit: Payer: Self-pay | Admitting: *Deleted

## 2016-03-26 DIAGNOSIS — C250 Malignant neoplasm of head of pancreas: Secondary | ICD-10-CM

## 2016-03-26 DIAGNOSIS — K219 Gastro-esophageal reflux disease without esophagitis: Secondary | ICD-10-CM | POA: Diagnosis not present

## 2016-03-26 DIAGNOSIS — R4182 Altered mental status, unspecified: Secondary | ICD-10-CM | POA: Insufficient documentation

## 2016-03-26 DIAGNOSIS — R739 Hyperglycemia, unspecified: Secondary | ICD-10-CM | POA: Diagnosis present

## 2016-03-26 DIAGNOSIS — I110 Hypertensive heart disease with heart failure: Secondary | ICD-10-CM | POA: Insufficient documentation

## 2016-03-26 DIAGNOSIS — M1990 Unspecified osteoarthritis, unspecified site: Secondary | ICD-10-CM | POA: Insufficient documentation

## 2016-03-26 DIAGNOSIS — C4492 Squamous cell carcinoma of skin, unspecified: Secondary | ICD-10-CM | POA: Insufficient documentation

## 2016-03-26 DIAGNOSIS — F329 Major depressive disorder, single episode, unspecified: Secondary | ICD-10-CM | POA: Diagnosis not present

## 2016-03-26 DIAGNOSIS — Z79891 Long term (current) use of opiate analgesic: Secondary | ICD-10-CM | POA: Diagnosis not present

## 2016-03-26 DIAGNOSIS — G4733 Obstructive sleep apnea (adult) (pediatric): Secondary | ICD-10-CM | POA: Diagnosis not present

## 2016-03-26 DIAGNOSIS — Z8673 Personal history of transient ischemic attack (TIA), and cerebral infarction without residual deficits: Secondary | ICD-10-CM | POA: Diagnosis not present

## 2016-03-26 DIAGNOSIS — E785 Hyperlipidemia, unspecified: Secondary | ICD-10-CM | POA: Diagnosis not present

## 2016-03-26 DIAGNOSIS — Z7982 Long term (current) use of aspirin: Secondary | ICD-10-CM | POA: Insufficient documentation

## 2016-03-26 DIAGNOSIS — Z79899 Other long term (current) drug therapy: Secondary | ICD-10-CM | POA: Insufficient documentation

## 2016-03-26 DIAGNOSIS — I509 Heart failure, unspecified: Secondary | ICD-10-CM | POA: Insufficient documentation

## 2016-03-26 LAB — CBG MONITORING, ED: Glucose-Capillary: 174 mg/dL — ABNORMAL HIGH (ref 65–99)

## 2016-03-26 NOTE — Progress Notes (Signed)
Call received from patient concerning her biopsy appointment for Tuesday, patient states that she does not feel like biopsy is needed at this time and would like to discuss this with Dr. Benay Spice.  Information from patient relayed to Dr. Benay Spice and OK to cancel biopsy if patient does not want it at this time.  Return call placed to patient with instructions from Dr. Benay Spice and pt wishes to cancel biopsy.  She will keep her appt as scheduled on 03/31/16 at 0930 with Dr. Benay Spice to discuss her care.  Pt very appreciative of call and has no further questions or concerns at this time.  Interventional radiology notified of cancellation.

## 2016-03-26 NOTE — ED Notes (Addendum)
Pt from home with her daughter and husband. Pt has complaints of hyperglycemia. Pt's CBG at home was 253 at 2300. At time of assessment, CBG is 174. Pt is alert and oriented at time of assessment. Pt's daughter stated they called the on call nurse who recommended they wake her up at 0300 and recheck her sugar. Pt has a history of pancreatic cancer. Pt was recently diagnosed and they have not created a plan of care for the cancer.  Pt has a fentanyl patch on currently, and has taken oxycodone at home (about 1 hour ago). This is her normal amount of pain medication Per pt's daughter, the pt is normally alert and oriented x 4 but today has been disoriented and confused. Pt has forgotten where she was in her own home and has been more lethargic than normal.

## 2016-03-26 NOTE — Telephone Encounter (Signed)
per pof to CX biopsy-appt already CX

## 2016-03-27 ENCOUNTER — Emergency Department (HOSPITAL_COMMUNITY): Payer: Medicare Other

## 2016-03-27 DIAGNOSIS — R4182 Altered mental status, unspecified: Secondary | ICD-10-CM | POA: Diagnosis not present

## 2016-03-27 LAB — CBC
HCT: 32.9 % — ABNORMAL LOW (ref 36.0–46.0)
HEMOGLOBIN: 10.7 g/dL — AB (ref 12.0–15.0)
MCH: 30.4 pg (ref 26.0–34.0)
MCHC: 32.5 g/dL (ref 30.0–36.0)
MCV: 93.5 fL (ref 78.0–100.0)
PLATELETS: 222 10*3/uL (ref 150–400)
RBC: 3.52 MIL/uL — AB (ref 3.87–5.11)
RDW: 13 % (ref 11.5–15.5)
WBC: 6.7 10*3/uL (ref 4.0–10.5)

## 2016-03-27 LAB — AMMONIA: AMMONIA: 23 umol/L (ref 9–35)

## 2016-03-27 LAB — HEPATIC FUNCTION PANEL
ALBUMIN: 3.7 g/dL (ref 3.5–5.0)
ALK PHOS: 80 U/L (ref 38–126)
ALT: 7 U/L — ABNORMAL LOW (ref 14–54)
AST: 14 U/L — ABNORMAL LOW (ref 15–41)
BILIRUBIN TOTAL: 0.3 mg/dL (ref 0.3–1.2)
Bilirubin, Direct: <0.1 mg/dL — ABNORMAL LOW (ref 0.1–0.5)
Total Protein: 6.9 g/dL (ref 6.5–8.1)

## 2016-03-27 LAB — URINALYSIS, ROUTINE W REFLEX MICROSCOPIC
BILIRUBIN URINE: NEGATIVE
Glucose, UA: NEGATIVE mg/dL
Hgb urine dipstick: NEGATIVE
Ketones, ur: NEGATIVE mg/dL
Leukocytes, UA: NEGATIVE
NITRITE: NEGATIVE
PROTEIN: NEGATIVE mg/dL
SPECIFIC GRAVITY, URINE: 1.014 (ref 1.005–1.030)
pH: 5 (ref 5.0–8.0)

## 2016-03-27 LAB — BASIC METABOLIC PANEL
ANION GAP: 12 (ref 5–15)
BUN: 18 mg/dL (ref 6–20)
CALCIUM: 9.4 mg/dL (ref 8.9–10.3)
CO2: 28 mmol/L (ref 22–32)
Chloride: 103 mmol/L (ref 101–111)
Creatinine, Ser: 0.87 mg/dL (ref 0.44–1.00)
Glucose, Bld: 191 mg/dL — ABNORMAL HIGH (ref 65–99)
Potassium: 3.9 mmol/L (ref 3.5–5.1)
Sodium: 143 mmol/L (ref 135–145)

## 2016-03-27 MED ORDER — SODIUM CHLORIDE 0.9 % IV SOLN: 1000.0000 mL | INTRAVENOUS | Status: DC

## 2016-03-27 MED ORDER — SODIUM CHLORIDE 0.9 % IV SOLN
1000.0000 mL | Freq: Once | INTRAVENOUS | Status: AC
  Administered 2016-03-27: 1000 mL via INTRAVENOUS

## 2016-03-27 MED ORDER — METFORMIN HCL 500 MG PO TABS: 500.0000 mg | ORAL_TABLET | Freq: Two times a day (BID) | ORAL | Status: DC

## 2016-03-27 NOTE — Discharge Instructions (Signed)
Discuss with your doctor whether pancreatic enzymes may be helpful with your digestion.  Type 2 Diabetes Mellitus, Adult Type 2 diabetes mellitus, often simply referred to as type 2 diabetes, is a long-lasting (chronic) disease. In type 2 diabetes, the pancreas does not make enough insulin (a hormone), the cells are less responsive to the insulin that is made (insulin resistance), or both. Normally, insulin moves sugars from food into the tissue cells. The tissue cells use the sugars for energy. The lack of insulin or the lack of normal response to insulin causes excess sugars to build up in the blood instead of going into the tissue cells. As a result, high blood sugar (hyperglycemia) develops. The effect of high sugar (glucose) levels can cause many complications. Type 2 diabetes was also previously called adult-onset diabetes, but it can occur at any age.  RISK FACTORS  A person is predisposed to developing type 2 diabetes if someone in the family has the disease and also has one or more of the following primary risk factors:  Weight gain, or being overweight or obese.  An inactive lifestyle.  A history of consistently eating high-calorie foods. Maintaining a normal weight and regular physical activity can reduce the chance of developing type 2 diabetes. SYMPTOMS  A person with type 2 diabetes may not show symptoms initially. The symptoms of type 2 diabetes appear slowly. The symptoms include:  Increased thirst (polydipsia).  Increased urination (polyuria).  Increased urination during the night (nocturia).  Sudden or unexplained weight changes.  Frequent, recurring infections.  Tiredness (fatigue).  Weakness.  Vision changes, such as blurred vision.  Fruity smell to your breath.  Abdominal pain.  Nausea or vomiting.  Cuts or bruises which are slow to heal.  Tingling or numbness in the hands or feet.  An open skin wound (ulcer). DIAGNOSIS Type 2 diabetes is  frequently not diagnosed until complications of diabetes are present. Type 2 diabetes is diagnosed when symptoms or complications are present and when blood glucose levels are increased. Your blood glucose level may be checked by one or more of the following blood tests:  A fasting blood glucose test. You will not be allowed to eat for at least 8 hours before a blood sample is taken.  A random blood glucose test. Your blood glucose is checked at any time of the day regardless of when you ate.  A hemoglobin A1c blood glucose test. A hemoglobin A1c test provides information about blood glucose control over the previous 3 months.  An oral glucose tolerance test (OGTT). Your blood glucose is measured after you have not eaten (fasted) for 2 hours and then after you drink a glucose-containing beverage. TREATMENT   You may need to take insulin or diabetes medicine daily to keep blood glucose levels in the desired range.  If you use insulin, you may need to adjust the dosage depending on the carbohydrates that you eat with each meal or snack.  Lifestyle changes are recommended as part of your treatment. These may include:  Following an individualized diet plan developed by a nutritionist or dietitian.  Exercising daily. Your health care providers will set individualized treatment goals for you based on your age, your medicines, how long you have had diabetes, and any other medical conditions you have. Generally, the goal of treatment is to maintain the following blood glucose levels:  Before meals (preprandial): 80-130 mg/dL.  After meals (postprandial): below 180 mg/dL.  A1c: less than 6.5-7%. HOME CARE INSTRUCTIONS   Have  your hemoglobin A1c level checked twice a year.  Perform daily blood glucose monitoring as directed by your health care provider.  Monitor urine ketones when you are ill and as directed by your health care provider.  Take your diabetes medicine or insulin as directed by  your health care provider to maintain your blood glucose levels in the desired range.  Never run out of diabetes medicine or insulin. It is needed every day.  If you are using insulin, you may need to adjust the amount of insulin given based on your intake of carbohydrates. Carbohydrates can raise blood glucose levels but need to be included in your diet. Carbohydrates provide vitamins, minerals, and fiber which are an essential part of a healthy diet. Carbohydrates are found in fruits, vegetables, whole grains, dairy products, legumes, and foods containing added sugars.  Eat healthy foods. You should make an appointment to see a registered dietitian to help you create an eating plan that is right for you.  Lose weight if you are overweight.  Carry a medical alert card or wear your medical alert jewelry.  Carry a 15-gram carbohydrate snack with you at all times to treat low blood glucose (hypoglycemia). Some examples of 15-gram carbohydrate snacks include:  Glucose tablets, 3 or 4.  Glucose gel, 15-gram tube.  Raisins, 2 tablespoons (24 grams).  Jelly beans, 6.  Animal crackers, 8.  Regular pop, 4 ounces (120 mL).  Gummy treats, 9.  Recognize hypoglycemia. Hypoglycemia occurs with blood glucose levels of 70 mg/dL and below. The risk for hypoglycemia increases when fasting or skipping meals, during or after intense exercise, and during sleep. Hypoglycemia symptoms can include:  Tremors or shakes.  Decreased ability to concentrate.  Sweating.  Increased heart rate.  Headache.  Dry mouth.  Hunger.  Irritability.  Anxiety.  Restless sleep.  Altered speech or coordination.  Confusion.  Treat hypoglycemia promptly. If you are alert and able to safely swallow, follow the 15:15 rule:  Take 15-20 grams of rapid-acting glucose or carbohydrate. Rapid-acting options include glucose gel, glucose tablets, or 4 ounces (120 mL) of fruit juice, regular soda, or low-fat  milk.  Check your blood glucose level 15 minutes after taking the glucose.  Take 15-20 grams more of glucose if the repeat blood glucose level is still 70 mg/dL or below.  Eat a meal or snack within 1 hour once blood glucose levels return to normal.  Be alert to feeling very thirsty and urinating more frequently than usual, which are early signs of hyperglycemia. An early awareness of hyperglycemia allows for prompt treatment. Treat hyperglycemia as directed by your health care provider.  Engage in at least 150 minutes of moderate-intensity physical activity a week, spread over at least 3 days of the week or as directed by your health care provider. In addition, you should engage in resistance exercise at least 2 times a week or as directed by your health care provider. Try to spend no more than 90 minutes at one time inactive.  Adjust your medicine and food intake as needed if you start a new exercise or sport.  Follow your sick-day plan anytime you are unable to eat or drink as usual.  Do not use any tobacco products including cigarettes, chewing tobacco, or electronic cigarettes. If you need help quitting, ask your health care provider.  Limit alcohol intake to no more than 1 drink per day for nonpregnant women and 2 drinks per day for men. You should drink alcohol only when you  are also eating food. Talk with your health care provider whether alcohol is safe for you. Tell your health care provider if you drink alcohol several times a week.  Keep all follow-up visits as directed by your health care provider. This is important.  Schedule an eye exam soon after the diagnosis of type 2 diabetes and then annually.  Perform daily skin and foot care. Examine your skin and feet daily for cuts, bruises, redness, nail problems, bleeding, blisters, or sores. A foot exam by a health care provider should be done annually.  Brush your teeth and gums at least twice a day and floss at least once a day.  Follow up with your dentist regularly.  Share your diabetes management plan with your workplace or school.  Keep your immunizations up to date. It is recommended that you receive a flu (influenza) vaccine every year. It is also recommended that you receive a pneumonia (pneumococcal) vaccine. If you are 42 years of age or older and have never received a pneumonia vaccine, this vaccine may be given as a series of two separate shots. Ask your health care provider which additional vaccines may be recommended.  Learn to manage stress.  Obtain ongoing diabetes education and support as needed.  Participate in or seek rehabilitation as needed to maintain or improve independence and quality of life. Request a physical or occupational therapy referral if you are having foot or hand numbness, or difficulties with grooming, dressing, eating, or physical activity. SEEK MEDICAL CARE IF:   You are unable to eat food or drink fluids for more than 6 hours.  You have nausea and vomiting for more than 6 hours.  Your blood glucose level is over 240 mg/dL.  There is a change in mental status.  You develop an additional serious illness.  You have diarrhea for more than 6 hours.  You have been sick or have had a fever for a couple of days and are not getting better.  You have pain during any physical activity.  SEEK IMMEDIATE MEDICAL CARE IF:  You have difficulty breathing.  You have moderate to large ketone levels.   This information is not intended to replace advice given to you by your health care provider. Make sure you discuss any questions you have with your health care provider.   Document Released: 11/01/2005 Document Revised: 07/23/2015 Document Reviewed: 05/30/2012 Elsevier Interactive Patient Education 2016 [employer].  Metformin tablets What is this medicine? METFORMIN (met FOR min) is used to treat type 2 diabetes. It helps to control blood sugar. Treatment is combined with diet  and exercise. This medicine can be used alone or with other medicines for diabetes. This medicine may be used for other purposes; ask your health care provider or pharmacist if you have questions. What should I tell my health care provider before I take this medicine? They need to know if you have any of these conditions: -anemia -frequently drink alcohol-containing beverages -become easily dehydrated -heart attack -heart failure that is treated with medications -kidney disease -liver disease -polycystic ovary syndrome -serious infection or injury -vomiting -an unusual or allergic reaction to metformin, other medicines, foods, dyes, or preservatives -pregnant or trying to get pregnant -breast-feeding How should I use this medicine? Take this medicine by mouth. Take it with meals. Swallow the tablets with a drink of water. Follow the directions on the prescription label. Take your medicine at regular intervals. Do not take your medicine more often than directed. Talk to your pediatrician  regarding the use of this medicine in children. While this drug may be prescribed for children as young as 57 years of age for selected conditions, precautions do apply. Overdosage: If you think you have taken too much of this medicine contact a poison control center or emergency room at once. NOTE: This medicine is only for you. Do not share this medicine with others. What if I miss a dose? If you miss a dose, take it as soon as you can. If it is almost time for your next dose, take only that dose. Do not take double or extra doses. What may interact with this medicine? Do not take this medicine with any of the following medications: -dofetilide -gatifloxacin -certain contrast medicines given before X-rays, CT scans, MRI, or other procedures This medicine may also interact with the following medications: -acetazolamide -certain medicines for HIV infection or hepatitis, like adefovir, emtricitabine,  entecavir, lamivudine, or tenofovir -cimetidine -crizotinib -digoxin -diuretics -female hormones, like estrogens or progestins and birth control pills -glycopyrrolate -isoniazid -lamotrigine -medicines for blood pressure, heart disease, irregular heart beat -memantine -midodrine -methazolamide -morphine -nicotinic acid -phenothiazines like chlorpromazine, mesoridazine, prochlorperazine, thioridazine -phenytoin -procainamide -propantheline -quinidine -quinine -ranitidine -ranolazine -steroid medicines like prednisone or cortisone -stimulant medicines for attention disorders, weight loss, or to stay awake -thyroid medicines -topiramate -trimethoprim -trospium -vancomycin -vandetanib -zonisamide This list may not describe all possible interactions. Give your health care provider a list of all the medicines, herbs, non-prescription drugs, or dietary supplements you use. Also tell them if you smoke, drink alcohol, or use illegal drugs. Some items may interact with your medicine. What should I watch for while using this medicine? Visit your doctor or health care professional for regular checks on your progress. A test called the HbA1C (A1C) will be monitored. This is a simple blood test. It measures your blood sugar control over the last 2 to 3 months. You will receive this test every 3 to 6 months. Learn how to check your blood sugar. Learn the symptoms of low and high blood sugar and how to manage them. Always carry a quick-source of sugar with you in case you have symptoms of low blood sugar. Examples include hard sugar candy or glucose tablets. Make sure others know that you can choke if you eat or drink when you develop serious symptoms of low blood sugar, such as seizures or unconsciousness. They must get medical help at once. Tell your doctor or health care professional if you have high blood sugar. You might need to change the dose of your medicine. If you are sick or  exercising more than usual, you might need to change the dose of your medicine. Do not skip meals. Ask your doctor or health care professional if you should avoid alcohol. Many nonprescription cough and cold products contain sugar or alcohol. These can affect blood sugar. This medicine may cause ovulation in premenopausal women who do not have regular monthly periods. This may increase your chances of becoming pregnant. You should not take this medicine if you become pregnant or think you may be pregnant. Talk with your doctor or health care professional about your birth control options while taking this medicine. Contact your doctor or health care professional right away if think you are pregnant. If you are going to need surgery, a MRI, CT scan, or other procedure, tell your doctor that you are taking this medicine. You may need to stop taking this medicine before the procedure. Wear a medical ID bracelet or chain,  and carry a card that describes your disease and details of your medicine and dosage times. What side effects may I notice from receiving this medicine? Side effects that you should report to your doctor or health care professional as soon as possible: -allergic reactions like skin rash, itching or hives, swelling of the face, lips, or tongue -breathing problems -feeling faint or lightheaded, falls -muscle aches or pains -signs and symptoms of low blood sugar such as feeling anxious, confusion, dizziness, increased hunger, unusually weak or tired, sweating, shakiness, cold, irritable, headache, blurred vision, fast heartbeat, loss of consciousness -slow or irregular heartbeat -unusual stomach pain or discomfort -unusually tired or weak Side effects that usually do not require medical attention (report to your doctor or health care professional if they continue or are bothersome): -diarrhea -headache -heartburn -metallic taste in mouth -nausea -stomach gas, upset This list may not  describe all possible side effects. Call your doctor for medical advice about side effects. You may report side effects to FDA at 1-800-FDA-1088. Where should I keep my medicine? Keep out of the reach of children. Store at room temperature between 15 and 30 degrees C (59 and 86 degrees F). Protect from moisture and light. Throw away any unused medicine after the expiration date. NOTE: This sheet is a summary. It may not cover all possible information. If you have questions about this medicine, talk to your doctor, pharmacist, or health care provider.    2016, Elsevier/[employer]. (2014-04-16 22:14:40)

## 2016-03-27 NOTE — ED Provider Notes (Addendum)
CSN: [id number]     Arrival date & time 03/26/16  2339 History  By signing my name below, I, Mesha Guinyard, attest that this documentation has been prepared under the direction and in the presence of Delora Fuel, MD.  Electronically Signed: Verlee Monte, Medical Scribe. 03/27/2016. 1:24 AM.    Chief Complaint  Patient presents with  . Hyperglycemia  . Altered Mental Status    The history is provided by the patient and a relative. No language interpreter was used.    HPI Comments: [name] is a 81 y.o. female with PMHx of pancreatic CA, an aneurism in 2008, and borderline DM who presents to the Emergency Department complaining of sudden altered mental status, and hyperglycemia with onset this afternoon. Pts CBG has been running at 260 at home. She states that she is having associated symptoms of fatigue all day, AMS that last 30 mins to an hour earlier today. Pt's relative states that onset was this afternoon after pt woke up bugged eyed, concerned about the time, appetite change, and confused on her location. Pt mentions regurgitation since her pancreatic CA. She states that she has not tried medications for her symptoms, but she drinks more than she eats for her decrease appetite. Pts relative mentioned pt took oxycotton this morning, and tylenol this afternoon. She denies nausea and any other symptoms.   Pt was recently diagnoses with pancreatic CA with no care plan yet. Pt states her right knee went out, and she uses menthol patches for relief.  Past Medical History  Diagnosis Date  . OSA (obstructive sleep apnea)     pt denies this  . HTN (hypertension)   . Atrial fibrillation (HCC)     paroxysmal; on coumadin  . CAD (coronary artery disease)     a. cath 7/11: LM 40%, mild plaque disease in CFX, LAD, and RCA;  b. 2011 CABG with S-LAD and S-OM done at time of Aortic dissection repair  . Chronic diastolic CHF (congestive heart failure) ([place])     a. 08/2011 Echo: EF 55-60%,  PASP 58mmHg  . Dissection of aorta, thoracic ([place])     a. Type A; s/p repair 7/11 with aortic root repair and CABG x 2   . ASD (atrial septal defect)     a. s/p repair [facility] at [facility]  . Right heart failure (HCC)     a. 2/2 TR and RV dysfxn;  b. echo 4/12: EF 60%, mild LVH, mild AI, mild MR, mod LAE, mod RVE with mod dec. RVSF, mod RAE, mod to severe TR, PASP 58;  c. right heart cath 4/12:  RA mean 8, RV 46/1 with mean 6, PA 45/13 with mean 26, PCWP mean 14, CO 3.68, CI 2.1 (no sig pulmon HTN)  . GERD (gastroesophageal reflux disease)   . IBS (irritable bowel syndrome)   . Esophageal stricture   . Colonic polyp   . Peripheral neuropathy ([place])   . Fibromyalgia   . Anxiety   . HLD (hyperlipidemia)   . Borderline diabetes   . History of TIAs   . Normocytic anemia   . Thrombocytopenia ([place])   . Macular degeneration   . Depression   . Esophageal dysmotilities   . Cancer (HCC)     squamous cell CA  . Sleep apnea     pt denies OSA  . Anemia, iron deficiency 11/21/2014  . DJD (degenerative joint disease)   . Osteoarthritis    Past Surgical History  Procedure Laterality Date  .  Asd repair  1982    [facility]  . Shoulder open rotator cuff repair Right 2007  . Cardioversion  1982; 1995 X 2  . Appendectomy    . Vaginal hysterectomy  1987    A/P Repair With Cystocele and rectocele repair   . Salpingoophorectomy Bilateral 1994  . Pelvic laparoscopy  1994  . Emergency redo median sternotomy for hemiarch repair of acute type a aortic  dissection  06/05/2010  . Nasal hemorrhage control  12/27/2011    Procedure: EPISTAXIS CONTROL;  Surgeon: Ruby Cola, MD;  Location: [facility] OR;  Service: ENT;  Laterality: N/A;  . Cataract extraction w/ intraocular lens  implant, bilateral Bilateral   . Cardiac catheterization  ~ 1971; 1982  . Esophagogastroduodenoscopy (egd) with esophageal dilation  "several times"  . Tubal ligation  1969  . Tonsillectomy and adenoidectomy  ~ 1944  . Nasal hemorrhage control  2013     "at [facility]"  . Eus N/A 03/11/2016    Procedure: UPPER ENDOSCOPIC ULTRASOUND (EUS) LINEAR;  Surgeon: [name], MD;  Location: [facility] ENDOSCOPY;  Service: Endoscopy;  Laterality: N/A;  . Ercp N/A 03/11/2016    Procedure: ENDOSCOPIC RETROGRADE CHOLANGIOPANCREATOGRAPHY (ERCP);  Surgeon: [name], MD;  Location: [facility];  Service: Endoscopy;  Laterality: N/A;   Family History  Problem Relation Age of Onset  . Pancreatic cancer Sister   . Osteoporosis Sister   . Lung cancer Brother     x 2  . Heart attack Brother   . Melanoma Sister   . Diabetes Mother   . Hypertension Mother   . ALS Mother   . Colon polyps Sister   . Heart disease Brother     x 2  . Esophageal cancer Neg Hx   . Colon cancer Neg Hx   . Rectal cancer Neg Hx   . Stomach cancer Neg Hx    Social History  Substance Use Topics  . Smoking status: Never Smoker   . Smokeless tobacco: Never Used  . Alcohol Use: No   OB History    Gravida Para Term Preterm AB TAB SAB Ectopic Multiple Living   4 3 3  1     3      Review of Systems  Constitutional: Positive for appetite change and fatigue.  Psychiatric/Behavioral: Positive for confusion.  All other systems reviewed and are negative.  Allergies  Amiodarone; Crestor; Ropinirole hydrochloride; Atorvastatin; Augmentin; Codeine; Erythromycin; Ezetimibe; Meperidine hcl; Morphine; Neomycin-bacitracin zn-polymyx; and Simvastatin  Home Medications   Prior to Admission medications   Medication Sig Start Date End Date Taking? Authorizing Provider  acetaminophen (TYLENOL) 500 MG tablet Take 1,000 mg by mouth every 6 (six) hours as needed (For pain.).    Yes Historical Provider, MD  aspirin EC 81 MG tablet Take 81 mg by mouth every evening.  05/11/13  Yes Sherren Mocha, MD  cholecalciferol (VITAMIN D) 1000 UNITS tablet Take 1,000 Units by mouth daily.    Yes Historical Provider, MD  clorazepate (TRANXENE) 7.5 MG tablet Take 1-2 tablets (7.5-15 mg total) by  mouth at bedtime as needed. for sleep Patient taking differently: Take 7.5-15 mg by mouth at bedtime as needed for sleep.  03/12/16  Yes Colon Branch, MD  docusate sodium (COLACE) 50 MG capsule Take 50 mg by mouth 2 (two) times daily.   Yes Historical Provider, MD  dofetilide (TIKOSYN) 250 MCG capsule TAKE ONE CAPSULE BY MOUTH EVERY 12 HOURS Patient taking differently: Take 250 mcg by mouth every  12 (twelve) hours.  01/21/16  Yes Sherren Mocha, MD  fentaNYL (DURAGESIC - DOSED MCG/HR) 25 MCG/HR patch Place 1 patch (25 mcg total) onto the skin every 3 (three) days. 03/23/16  Yes Ladell Pier, MD  gabapentin (NEURONTIN) 300 MG capsule TAKE 2 CAPSULES BY MOUTH AT BEDTIME Patient taking differently: TAKE 600 MG BY MOUTH AT BEDTIME 11/18/15  Yes Biagio Borg, MD  KLOR-CON M20 20 MEQ tablet TAKE 1 TABLET (20 MEQ TOTAL) BY MOUTH DAILY. 07/07/15  Yes Biagio Borg, MD  metoprolol tartrate (LOPRESSOR) 25 MG tablet Take 12.5 mg by mouth 2 (two) times daily. Can Take Up To 3 half Tablets Twice Daily, with AFib symptoms   Yes Historical Provider, MD  Multiple Vitamins-Minerals (EYE VITAMINS PO) Take 1 tablet by mouth 2 (two) times daily.    Yes Historical Provider, MD  omeprazole (PRILOSEC) 20 MG capsule Take 20 mg by mouth 2 (two) times daily. Taking BID   Yes Historical Provider, MD  oxyCODONE (ROXICODONE) 5 MG immediate release tablet Take 1 tablet (5 mg total) by mouth every 4 (four) hours as needed for severe pain. 03/14/16  Yes Dorie Rank, MD  Polyethyl Glycol-Propyl Glycol (SYSTANE) 0.4-0.3 % SOLN Place 2 drops into both eyes daily as needed (For dry eyes.).   Yes Historical Provider, MD  Polyethylene Glycol 3350 (MIRALAX PO) Take 1 packet by mouth at bedtime.    Yes Historical Provider, MD  sertraline (ZOLOFT) 50 MG tablet Take 2 tablets (100 mg total) by mouth daily. 03/08/16  Yes Colon Branch, MD  spironolactone (ALDACTONE) 25 MG tablet Take 1 tablet (25 mg total) by mouth daily. 02/16/16  Yes Sherren Mocha, MD   torsemide (DEMADEX) 20 MG tablet Take 2 tablets (40 mg total) by mouth daily. 12/03/14  Yes Sherren Mocha, MD  vitamin C (ASCORBIC ACID) 500 MG tablet Take 500 mg by mouth daily. Reported on 03/08/2016   Yes Historical Provider, MD  azelastine (ASTELIN) 0.1 % nasal spray Place 2 sprays into both nostrils daily as needed for rhinitis or allergies. Patient not taking: Reported on 03/27/2016 02/25/16   Colon Branch, MD  SALINE NASAL SPRAY NA Place 1 spray into the nose as needed (For congestion.). Reported on 03/26/2016    Historical Provider, MD   Pulse 72  Temp(Src) 98.4 F (36.9 C) (Oral)  Resp 18  SpO2 94% Physical Exam  Constitutional: She is oriented to person, place, and time. She appears well-nourished. No distress.  Cachectic  HENT:  Head: Normocephalic and atraumatic.  Eyes: Conjunctivae and EOM are normal. Pupils are equal, round, and reactive to light. No scleral icterus.  Neck: Normal range of motion. Neck supple. No JVD present.  Cardiovascular: Normal rate and regular rhythm.  Exam reveals no gallop and no friction rub.   Murmur heard.  Systolic murmur is present with a grade of 3/6  3/6 holosystolic murmur  Pulmonary/Chest: Effort normal and breath sounds normal. No respiratory distress. She has no wheezes. She has no rales.  Abdominal: Soft. Bowel sounds are normal. She exhibits no distension and no mass. There is tenderness in the epigastric area. There is no rebound and no guarding.  Mild epigastric tenderness  Musculoskeletal: Normal range of motion. She exhibits no edema.  Lymphadenopathy:    She has no cervical adenopathy.  Neurological: She is alert and oriented to person, place, and time. No cranial nerve deficit. She exhibits normal muscle tone. Coordination normal.  Speech slightly slurred  Skin: Skin is  warm and dry. No rash noted.  Psychiatric: She has a normal mood and affect. Her behavior is normal. Judgment and thought content normal.  Nursing note and vitals  reviewed.   ED Course  Procedures (including critical care time) DIAGNOSTIC STUDIES: Oxygen Saturation is 94% on RA, adequate by my interpretation.    COORDINATION OF CARE: 12:44 AM Discussed treatment plan with pt at bedside and pt agreed to plan.  Labs Review Results for orders placed or performed during the hospital encounter of [id number]  Basic metabolic panel  Result Value Ref Range   Sodium 143 135 - 145 mmol/L   Potassium 3.9 3.5 - 5.1 mmol/L   Chloride 103 101 - 111 mmol/L   CO2 28 22 - 32 mmol/L   Glucose, Bld 191 (H) 65 - 99 mg/dL   BUN 18 6 - 20 mg/dL   Creatinine, Ser 0.87 0.44 - 1.00 mg/dL   Calcium 9.4 8.9 - 10.3 mg/dL   GFR calc non Af Amer >60 >60 mL/min   GFR calc Af Amer >60 >60 mL/min   Anion gap 12 5 - 15  CBC  Result Value Ref Range   WBC 6.7 4.0 - 10.5 K/uL   RBC 3.52 (L) 3.87 - 5.11 MIL/uL   Hemoglobin 10.7 (L) 12.0 - 15.0 g/dL   HCT 32.9 (L) 36.0 - 46.0 %   MCV 93.5 78.0 - 100.0 fL   MCH 30.4 26.0 - 34.0 pg   MCHC 32.5 30.0 - 36.0 g/dL   RDW 13.0 11.5 - 15.5 %   Platelets 222 150 - 400 K/uL  Urinalysis, Routine w reflex microscopic  Result Value Ref Range   Color, Urine YELLOW YELLOW   APPearance CLEAR CLEAR   Specific Gravity, Urine 1.014 1.005 - 1.030   pH 5.0 5.0 - 8.0   Glucose, UA NEGATIVE NEGATIVE mg/dL   Hgb urine dipstick NEGATIVE NEGATIVE   Bilirubin Urine NEGATIVE NEGATIVE   Ketones, ur NEGATIVE NEGATIVE mg/dL   Protein, ur NEGATIVE NEGATIVE mg/dL   Nitrite NEGATIVE NEGATIVE   Leukocytes, UA NEGATIVE NEGATIVE  Hepatic function panel  Result Value Ref Range   Total Protein 6.9 6.5 - 8.1 g/dL   Albumin 3.7 3.5 - 5.0 g/dL   AST 14 (L) 15 - 41 U/L   ALT 7 (L) 14 - 54 U/L   Alkaline Phosphatase 80 38 - 126 U/L   Total Bilirubin 0.3 0.3 - 1.2 mg/dL   Bilirubin, Direct <0.1 (L) 0.1 - 0.5 mg/dL   Indirect Bilirubin NOT CALCULATED 0.3 - 0.9 mg/dL  Ammonia  Result Value Ref Range   Ammonia 23 9 - 35 umol/L  CBG monitoring, ED   Result Value Ref Range   Glucose-Capillary 174 (H) 65 - 99 mg/dL   Comment 1 Notify RN    *Note: Due to a large number of results and/or encounters for the requested time period, some results have not been displayed. A complete set of results can be found in Results Review.    Imaging Review Ct Head Wo Contrast  03/27/2016  CLINICAL DATA:  Disoriented, confusion, lethargy. Hyperglycemia and recent diagnosis of pancreatic cancer. EXAM: CT HEAD WITHOUT CONTRAST TECHNIQUE: Contiguous axial images were obtained from the base of the skull through the vertex without intravenous contrast. COMPARISON:  CT HEAD November 29, 2014 FINDINGS: INTRACRANIAL CONTENTS: The ventricles and sulci are normal for age. No intraparenchymal hemorrhage, mass effect nor midline shift. Patchy supratentorial white matter hypodensities are less than expected for patient's age and  though non-specific likely represent chronic small vessel ischemic disease. No acute large vascular territory infarcts. No abnormal extra-axial fluid collections. Basal cisterns are patent. Moderate calcific atherosclerosis of the carotid siphons. ORBITS: The included ocular globes and orbital contents are non-suspicious. Status post bilateral ocular lens implants. SINUSES: Chronic partially imaged maxillary sinusitis, bilateral antrectomies. Mastoid air cells are well aerated. SKULL/SOFT TISSUES: No skull fracture. No significant soft tissue swelling. IMPRESSION: Normal CT HEAD for age. Electronically Signed   By: Elon Alas M.D.   On: 03/27/2016 01:43   I have personally reviewed and evaluated these images and lab results as part of my medical decision-making.   MDM   Final diagnoses:  Hyperglycemia  Altered mental status, unspecified altered mental status type  Malignant neoplasm of head of pancreas ([place])    Episode of confusion in patient with recently diagnosed pancreatic cancer. Old records are reviewed and oncology note does express  that she is to make a decision regarding palliative care versus aggressive chemotherapy without curative intent. Patient clearly has symptoms related to her cancer with anorexia and generalized weakness. Relatively mild hyperglycemia is noted and has been present to some extent for years. However, patient her family seem very concerned about her hyperglycemia. Workup is initiated to look for reversible causes of her weakness and mental status change. Urinalysis shows no evidence of urinary tract infection, CT of the head was normal, liver enzymes normal and ammonia normal. Patient's mental status had returned to baseline prior to arrival in the ED. I had fairly extensive discussion with patient and her sister and she clearly seems to be looking to go the route of palliative care. I've expanded no obvious cause has been found for her mental status change. However, to address her concerns about hyperglycemia, she is given a prescription for metformin which should improve glycemic control without putting her at risk for hypoglycemia. She is to keep her follow-up appointment with her oncologist.  I personally performed the services described in this documentation, which was scribed in my presence. The recorded information has been reviewed and is accurate.     Delora Fuel, MD [id number]  Rickita Forstner, MD [id number] [phone]

## 2016-03-28 ENCOUNTER — Other Ambulatory Visit: Payer: Self-pay | Admitting: Internal Medicine

## 2016-03-30 ENCOUNTER — Ambulatory Visit (HOSPITAL_COMMUNITY): Payer: Medicare Other

## 2016-03-31 ENCOUNTER — Encounter: Payer: Self-pay | Admitting: *Deleted

## 2016-03-31 ENCOUNTER — Ambulatory Visit (HOSPITAL_BASED_OUTPATIENT_CLINIC_OR_DEPARTMENT_OTHER): Payer: Medicare Other | Admitting: Oncology

## 2016-03-31 ENCOUNTER — Telehealth: Payer: Self-pay | Admitting: *Deleted

## 2016-03-31 ENCOUNTER — Telehealth: Payer: Self-pay | Admitting: Oncology

## 2016-03-31 ENCOUNTER — Ambulatory Visit (HOSPITAL_COMMUNITY)
Admission: RE | Admit: 2016-03-31 | Discharge: 2016-03-31 | Disposition: A | Discharge status: Home / Self Care | Payer: Medicare Other | Source: Ambulatory Visit | Attending: Oncology | Admitting: Oncology

## 2016-03-31 VITALS — BP 117/57 | HR 67 | Temp 98.1°F | Resp 17 | Ht 66.0 in | Wt 147.9 lb

## 2016-03-31 DIAGNOSIS — M1712 Unilateral primary osteoarthritis, left knee: Secondary | ICD-10-CM | POA: Diagnosis not present

## 2016-03-31 DIAGNOSIS — R938 Abnormal findings on diagnostic imaging of other specified body structures: Secondary | ICD-10-CM | POA: Diagnosis not present

## 2016-03-31 DIAGNOSIS — C250 Malignant neoplasm of head of pancreas: Secondary | ICD-10-CM | POA: Insufficient documentation

## 2016-03-31 DIAGNOSIS — R630 Anorexia: Secondary | ICD-10-CM | POA: Diagnosis not present

## 2016-03-31 DIAGNOSIS — G893 Neoplasm related pain (acute) (chronic): Secondary | ICD-10-CM | POA: Diagnosis not present

## 2016-03-31 DIAGNOSIS — R634 Abnormal weight loss: Secondary | ICD-10-CM

## 2016-03-31 MED ORDER — ONDANSETRON HCL 8 MG PO TABS: 8.0000 mg | ORAL_TABLET | Freq: Three times a day (TID) | ORAL | Status: AC | PRN

## 2016-03-31 NOTE — Progress Notes (Signed)
  [place] OFFICE PROGRESS NOTE   Diagnosis: Pancreas cancer  INTERVAL HISTORY:   Ms. Penner returns as scheduled. She decided against the sacral biopsy. She continues to have abdomen and back pain. She was seen in the emergency room 03/27/2016 with altered mental status. Her blood sugar was elevated and she was started on metformin. A laboratory evaluation and brain CT revealed no explanation for the mental status change.  She has anorexia, intermittent "regurgitation", and a diminished energy level. She is dependent on family members to help with activities of daily living. She does not get out of the house much. No diarrhea. She complains of right knee pain. Objective:  Vital signs in last 24 hours:  Blood pressure 117/57, pulse 67, temperature 98.1 F (36.7 C), temperature source Oral, resp. rate 17, height 5\' 6"  (1.676 m), weight 147 lb 14.4 oz (67.087 kg), SpO2 99 %.   Resp: Lungs with distant breath sounds, no respiratory distress Cardio: Regular rate and rhythm GI: No hepatomegaly, nontender, no mass Vascular: No leg edema Neuro: Lethargic after taking oxycodone  Musculoskeletal: Crepitance with flexion/extension at the right knee     Lab Results:  Lab Results  Component Value Date   WBC 6.7 03/27/2016   HGB 10.7* 03/27/2016   HCT 32.9* 03/27/2016   MCV 93.5 03/27/2016   PLT 222 03/27/2016   NEUTROABS 6.5 03/14/2016     Medications: I have reviewed the patient's current medications.  Assessment/Plan: 1. adenocarcinoma the pancreas, pancreas head Mass, status post an EUS biopsy on 03/11/2016 confirming adenocarcinoma  EUS consistent with stage IB disease, 2.4 cm pancreas mass with abutment of the main portal vein  MRI abdomen 03/05/2016 with multiple T2 hyperintense enhancing lesions consistent with bone metastases  CT chest 03/14/2016- no evidence of metastatic disease  2. Anorexia/weight loss  3. Pain secondary to  #1  4. History of coronary artery disease    Disposition:  Ms. Pearcy has been diagnosed with pancreas cancer. She appears to have metastatic disease. We discussed the risk/benefit of proceeding with a biopsy of the apparent sacral metastasis. We also discussed treatment options.  Her performance status has declined over the past few weeks. She does not wish to undergo biopsy procedure. I discussed comfort care versus a trial of gemcitabine/Abraxane. We reviewed the potential toxicities associated with the gemcitabine/Abraxane regimen.  Ms. Vanderstelt has a borderline performance status to tolerate chemotherapy. I recommend hospice care. She and her family are in agreement. She would like to have an x-ray of the right knee to rule out a metastasis involving the femur or tibia.  We discussed CPR and ACLS issues. She will be placed on a no CODE BLUE status. We added Zofran for nausea. We discontinued several nonessential medications.  Ms. Messenger will return for an office visit in approximately one week. I suspect the altered mental status was related to narcotic analgesics in the setting of delirium related to the critical illness.  Betsy Coder, MD  03/31/2016  10:11 AM

## 2016-03-31 NOTE — Telephone Encounter (Signed)
Spoke with pt's daughter, informed her knee Xray shows degenerative changes. No cancer. She voiced appreciation for call.

## 2016-03-31 NOTE — Progress Notes (Signed)
Oncology Nurse Navigator Documentation  Oncology Nurse Navigator Flowsheets 03/31/2016  Navigator Location [facility]  Navigator Encounter Type Follow-up Appt  Telephone -  Abnormal Finding Date -  Confirmed Diagnosis Date -  Treatment Initiated Date (No Data)--Performance status too poor to tx (Gemzar & Abraxane q 2weeks would be plan)  Patient Visit Type MedOnc  Treatment Phase Follow-up  Barriers/Navigation Needs Education;Family concerns--constipation: instructed to take MiraLax every day and stop the docusate; per Dr. Benay [name], stop all unessential meds  Education Pain/ Symptom Management;Understanding Cancer/ Treatment Options;Coping with Diagnosis/ Prognosis  Interventions Other--collaborative nurse to send referral to [place] (DNR);   Support Groups/Services -  Acuity -  Time Spent with Patient 63  Educated patient and family on what services Hospice can provide. She will still see Dr. Benay [name] and if her performance status improves for treatment, we can discharge her from Hospice and start chemo. Already has a home aid come in 2/week for 4 hours. [name] is very weak and can barely stand-High Fall Risk. Will see her again next week. Family will try to keep her hydrated and get bowels moving.

## 2016-03-31 NOTE — Telephone Encounter (Signed)
Gave pt apt & avs °

## 2016-03-31 NOTE — Telephone Encounter (Signed)
-----   Message from Ladell Pier, MD sent at 03/31/2016  3:41 PM EDT ----- Please call patient, xray of knee shows no cancer, has degenerative changes

## 2016-04-01 ENCOUNTER — Telehealth: Payer: Self-pay | Admitting: *Deleted

## 2016-04-01 NOTE — Telephone Encounter (Signed)
Message from [place] with hospice asking for order to initiate hospice standing orders. OK, per Dr. Benay Spice. Message from Newman Nickels, RN requesting to review med list. Asking which meds can be stopped? Pt needs a refill on Aldactone. Does Dr. Benay Spice want to order something for "regurgitation after meals?"  Reviewed with MD: Stop Vit C, Vit D, multivitamin and Metformin. OK to refill Aldactone. Verbal orders called to Newman Nickels, RN.  Noted refills are available for Aldactone.

## 2016-04-02 ENCOUNTER — Telehealth: Payer: Self-pay | Admitting: *Deleted

## 2016-04-02 ENCOUNTER — Other Ambulatory Visit: Payer: Self-pay | Admitting: Oncology

## 2016-04-02 NOTE — Telephone Encounter (Signed)
Call from [place], [facility] RN requesting to clarify medication list. The list she had was correct. Dr. Benay Spice left most meds in place for now, discontinued Metformin and vitamins.

## 2016-04-06 ENCOUNTER — Other Ambulatory Visit: Payer: Self-pay | Admitting: *Deleted

## 2016-04-06 ENCOUNTER — Telehealth: Payer: Self-pay | Admitting: *Deleted

## 2016-04-06 ENCOUNTER — Telehealth: Payer: Self-pay | Admitting: Cardiovascular Disease

## 2016-04-06 MED ORDER — FENTANYL 25 MCG/HR TD PT72: 25.0000 ug | MEDICATED_PATCH | TRANSDERMAL | Status: DC

## 2016-04-06 NOTE — Telephone Encounter (Signed)
  Oncology Nurse Navigator Documentation  Navigator Location: [facility] (04/06/16 1300) Navigator Encounter Type: Telephone (04/06/16 1300) Telephone: Incoming Call;Medication Assistance (04/06/16 1300)  VM from daughter, [name] asking if they should keep her 5/26 follow up appointment since Hospice is seeing her now? Also only has one Fentanyl patch and asking about refill for this-call the office or call Hospice? May need to page her when you call back. Can also reach her at [phone]. Forwarded message to collaborative nurse.

## 2016-04-06 NOTE — Telephone Encounter (Signed)
Return call from Deatra Ina requesting nurse call her on her work line [phone] and have her paged. Forwaded this message to collaborative nurse

## 2016-04-06 NOTE — Telephone Encounter (Signed)
New message    Pt daughter is calling to inform Dr.Cooper and Lauren   that her mother has went in to hospice care and he would like a call she sounds very sad   Pt verbalized that "Dr.Cooper and Lauren would want to know that my mom is now in [place]."

## 2016-04-06 NOTE — Telephone Encounter (Signed)
Left message on machine for pt's daughter Lattie Haw to contact the office.

## 2016-04-06 NOTE — Telephone Encounter (Signed)
Call received from patient's daughter, Lattie Haw asking if pt should keep appt to see L. Thomas NP on 04/09/16 and also requesting Fentanyl patch refill.    Per Dr. Benay Spice, pt may keep appointment if pt desires to do so.  Message left on [place] private cell phone informing her of MD instructions.

## 2016-04-07 ENCOUNTER — Telehealth: Payer: Self-pay | Admitting: Oncology

## 2016-04-07 ENCOUNTER — Other Ambulatory Visit: Payer: Self-pay | Admitting: *Deleted

## 2016-04-07 MED ORDER — OXYCODONE HCL 5 MG PO TABS: 5.0000 mg | ORAL_TABLET | ORAL | Status: DC | PRN

## 2016-04-07 NOTE — Telephone Encounter (Signed)
Call received from Deatra Ina, patient's daughter stating that pt is too weak to come in for appt on 04/09/16.  Request for Fentanyl patch and Oxycodone refill faxed to [facility] per pt.'s daughters request. Pt.'s daughter has no further questions or concerns at this time.  Instructed Lattie Haw to please call [facility] with any other needs that may arise.  Lattie Haw appreciative of return phone call.

## 2016-04-07 NOTE — Telephone Encounter (Signed)
Cancelled apt per pof

## 2016-04-09 ENCOUNTER — Ambulatory Visit: Payer: Medicare Other | Admitting: Nurse Practitioner

## 2016-04-13 ENCOUNTER — Telehealth: Payer: Self-pay | Admitting: *Deleted

## 2016-04-13 DIAGNOSIS — R55 Syncope and collapse: Secondary | ICD-10-CM

## 2016-04-13 MED ORDER — TORSEMIDE 20 MG PO TABS: 40.0000 mg | ORAL_TABLET | Freq: Every day | ORAL | Status: AC

## 2016-04-13 MED ORDER — OXYCODONE HCL 5 MG PO TABS: 5.0000 mg | ORAL_TABLET | ORAL | Status: AC | PRN

## 2016-04-13 MED ORDER — DOFETILIDE 250 MCG PO CAPS
ORAL_CAPSULE | ORAL | Status: AC
Start: 2016-04-13 — End: ?

## 2016-04-13 MED ORDER — FENTANYL 50 MCG/HR TD PT72: 50.0000 ug | MEDICATED_PATCH | TRANSDERMAL | Status: AC

## 2016-04-13 NOTE — Telephone Encounter (Signed)
Left message on machine for pt's daughter to contact the office.   

## 2016-04-13 NOTE — Telephone Encounter (Signed)
0822: Message from [place], [facility] RN requesting refills on Tikosyn, Torsemide and Fentanyl. 1000: Call from Hospice RN, Cherlyn Cushing reporting Fentanyl 25 mcg is not effective for pain. Patient is taking Oxycodone 5 mg Q4 hours around the clock. Requesting to change to oral long acting med. She reports pt is eating and drinking well, maintaining her weight. Discussed above with Dr. Benay Spice: OK to change to MS Contin 30 mg Q12 hours. Continue Oxycodone 5-10 mg Q 4 hours PRN. OK to refill Tikosyn and Torsemide.  Per Cherlyn Cushing, pt also needs refill on Oxycodone. Noted Oxycodone Rx was refilled on 5/24 for 100 tabs. Called pharmacy, Rx was too early so Oxycodone was not filled. Requested they destroy Oxycodone Rx. Will send new script with updated instructions.  Noted pt has Morphine intolerance: makes her "crazy." Discussed with Dr. Benay Spice: Increase Fentanyl dose to 50 mcg Q 72 hours. Notified [employer], Hospice RN. Prescriptions faxed to pharmacy.

## 2016-04-14 ENCOUNTER — Ambulatory Visit: Payer: Medicare Other | Admitting: Internal Medicine

## 2016-04-14 ENCOUNTER — Other Ambulatory Visit: Payer: Self-pay | Admitting: Thoracic Surgery (Cardiothoracic Vascular Surgery)

## 2016-04-14 DIAGNOSIS — I71019 Dissection of thoracic aorta, unspecified: Secondary | ICD-10-CM

## 2016-04-14 DIAGNOSIS — I7101 Dissection of thoracic aorta: Secondary | ICD-10-CM

## 2016-04-19 ENCOUNTER — Encounter: Payer: Self-pay | Admitting: *Deleted

## 2016-04-19 NOTE — Progress Notes (Unsigned)
Call received from hospice nurse on 04/18/16 to on call physician regarding patients uncontrollable pain.  Call placed to [facility], pt.'s hospice nurse to check on patients status.  [name] stated that she saw patient this morning and that patient is comfortable at this time and taking Fentanyl, Oxycodone 15 mg, Haldol and Ativan.  [facility] appreciative of f/u call.

## 2016-04-20 ENCOUNTER — Ambulatory Visit: Payer: Medicare Other | Admitting: Internal Medicine

## 2016-04-21 ENCOUNTER — Telehealth: Payer: Self-pay

## 2016-04-22 ENCOUNTER — Encounter: Payer: Self-pay | Admitting: *Deleted

## 2016-04-22 NOTE — Progress Notes (Signed)
Call received from [facility] [employer] with [facility] to inform Dr. Benay Spice that pt passed away on May 20, 2016.  Dr. Benay Spice informed.

## 2016-04-23 NOTE — Telephone Encounter (Signed)
We'll try to provide support to her husband who is our patient as well.

## 2016-05-03 ENCOUNTER — Ambulatory Visit: Payer: Medicare Other | Admitting: Thoracic Surgery (Cardiothoracic Vascular Surgery)

## 2016-05-15 NOTE — Telephone Encounter (Signed)
Pt passed away.  I contacted the family and offered my condolences.

## 2016-05-15 NOTE — Telephone Encounter (Signed)
Received news from fellow Pt: Ubaldo Glassing whom is a family friend of Pt that she passed away this AM around 0900.

## 2016-05-15 DEATH — deceased

## 2016-06-02 ENCOUNTER — Ambulatory Visit: Payer: Medicare Other | Admitting: Internal Medicine

## 2017-12-05 IMAGING — CR DG CHEST 2V
2 series · 2 of 2 positions shown · non-contrast
Comparison: 11/29/2014

CLINICAL DATA: Sinus congestion cough fever for several weeks worse
for several days

EXAM:
CHEST  2 VIEW

[w chest pa]
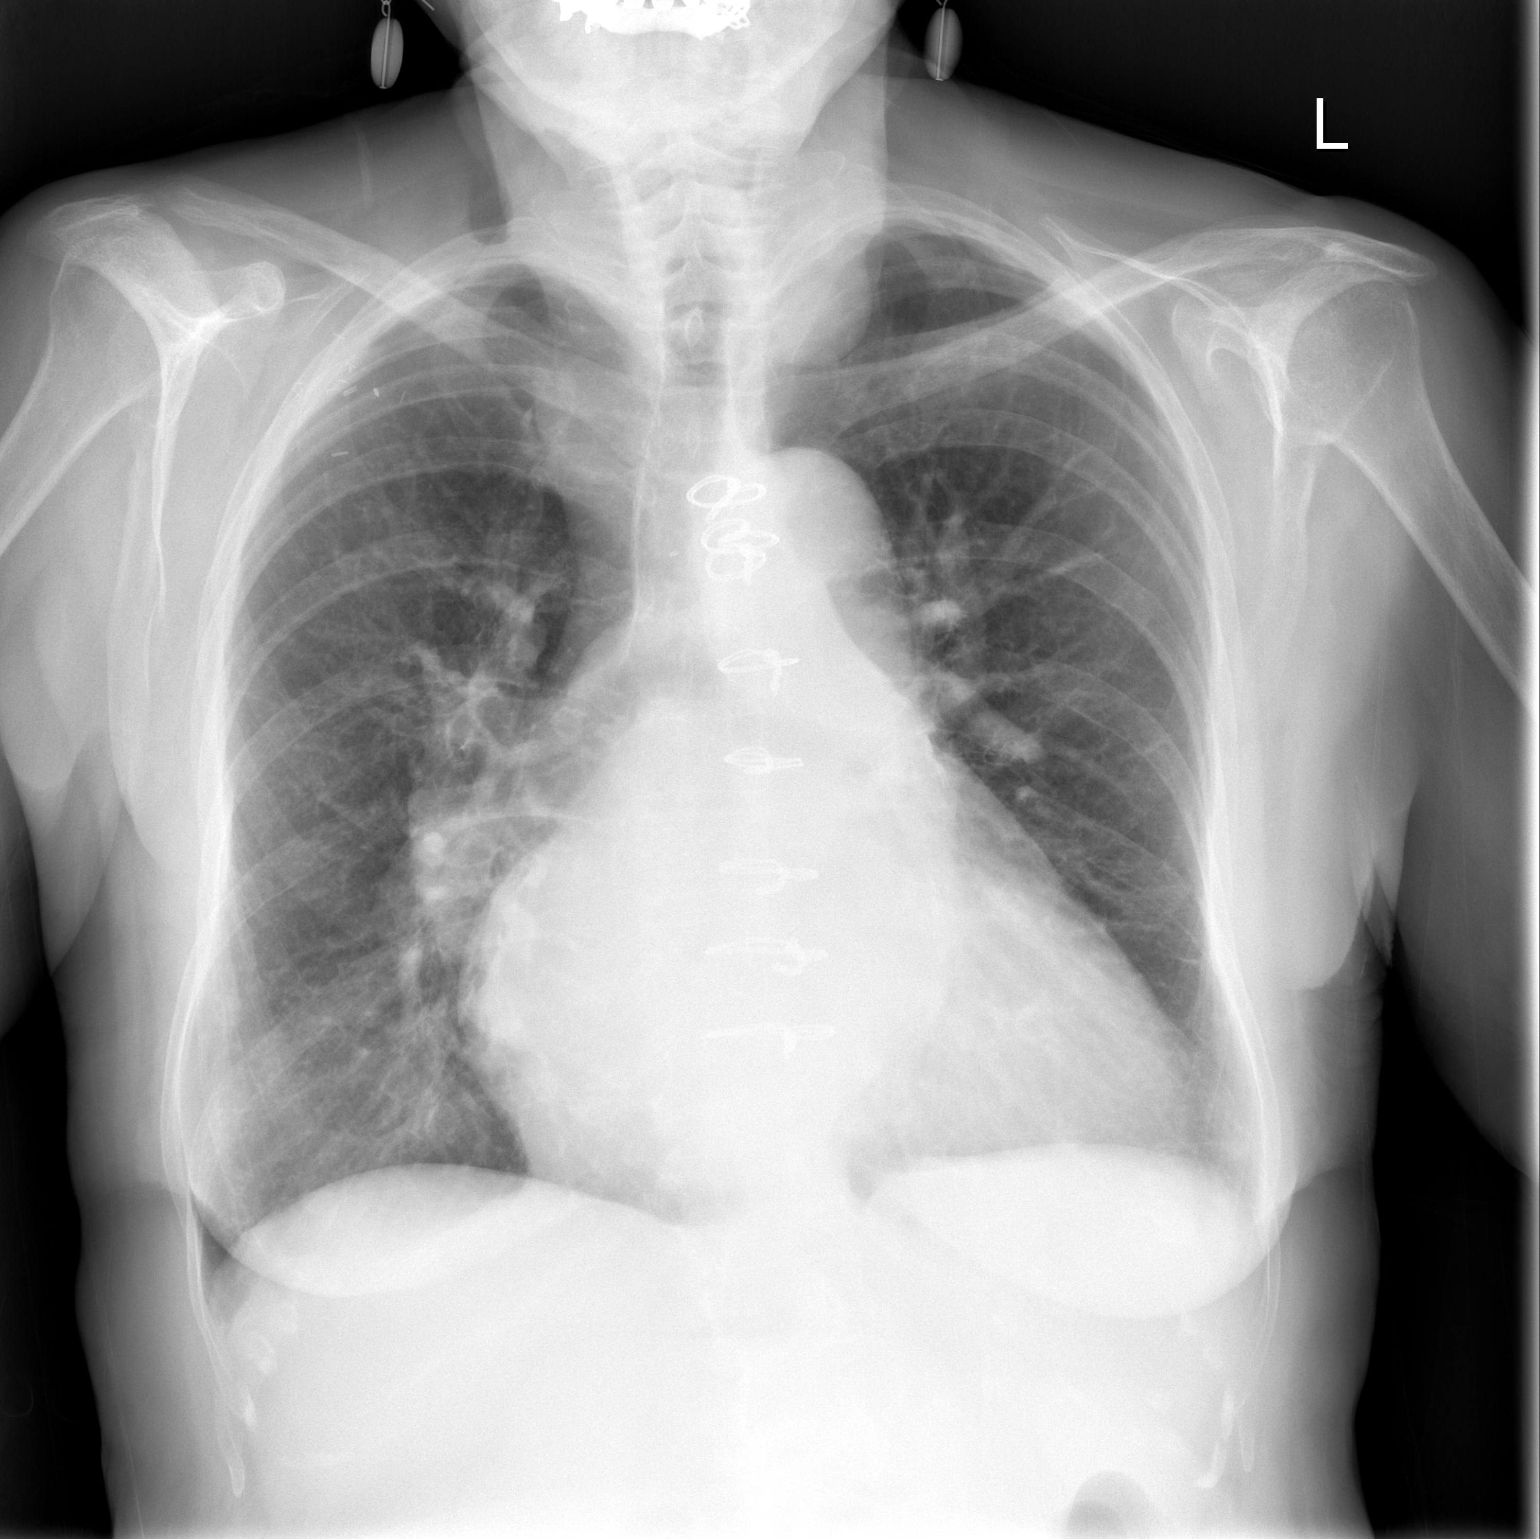

[w chest lat]
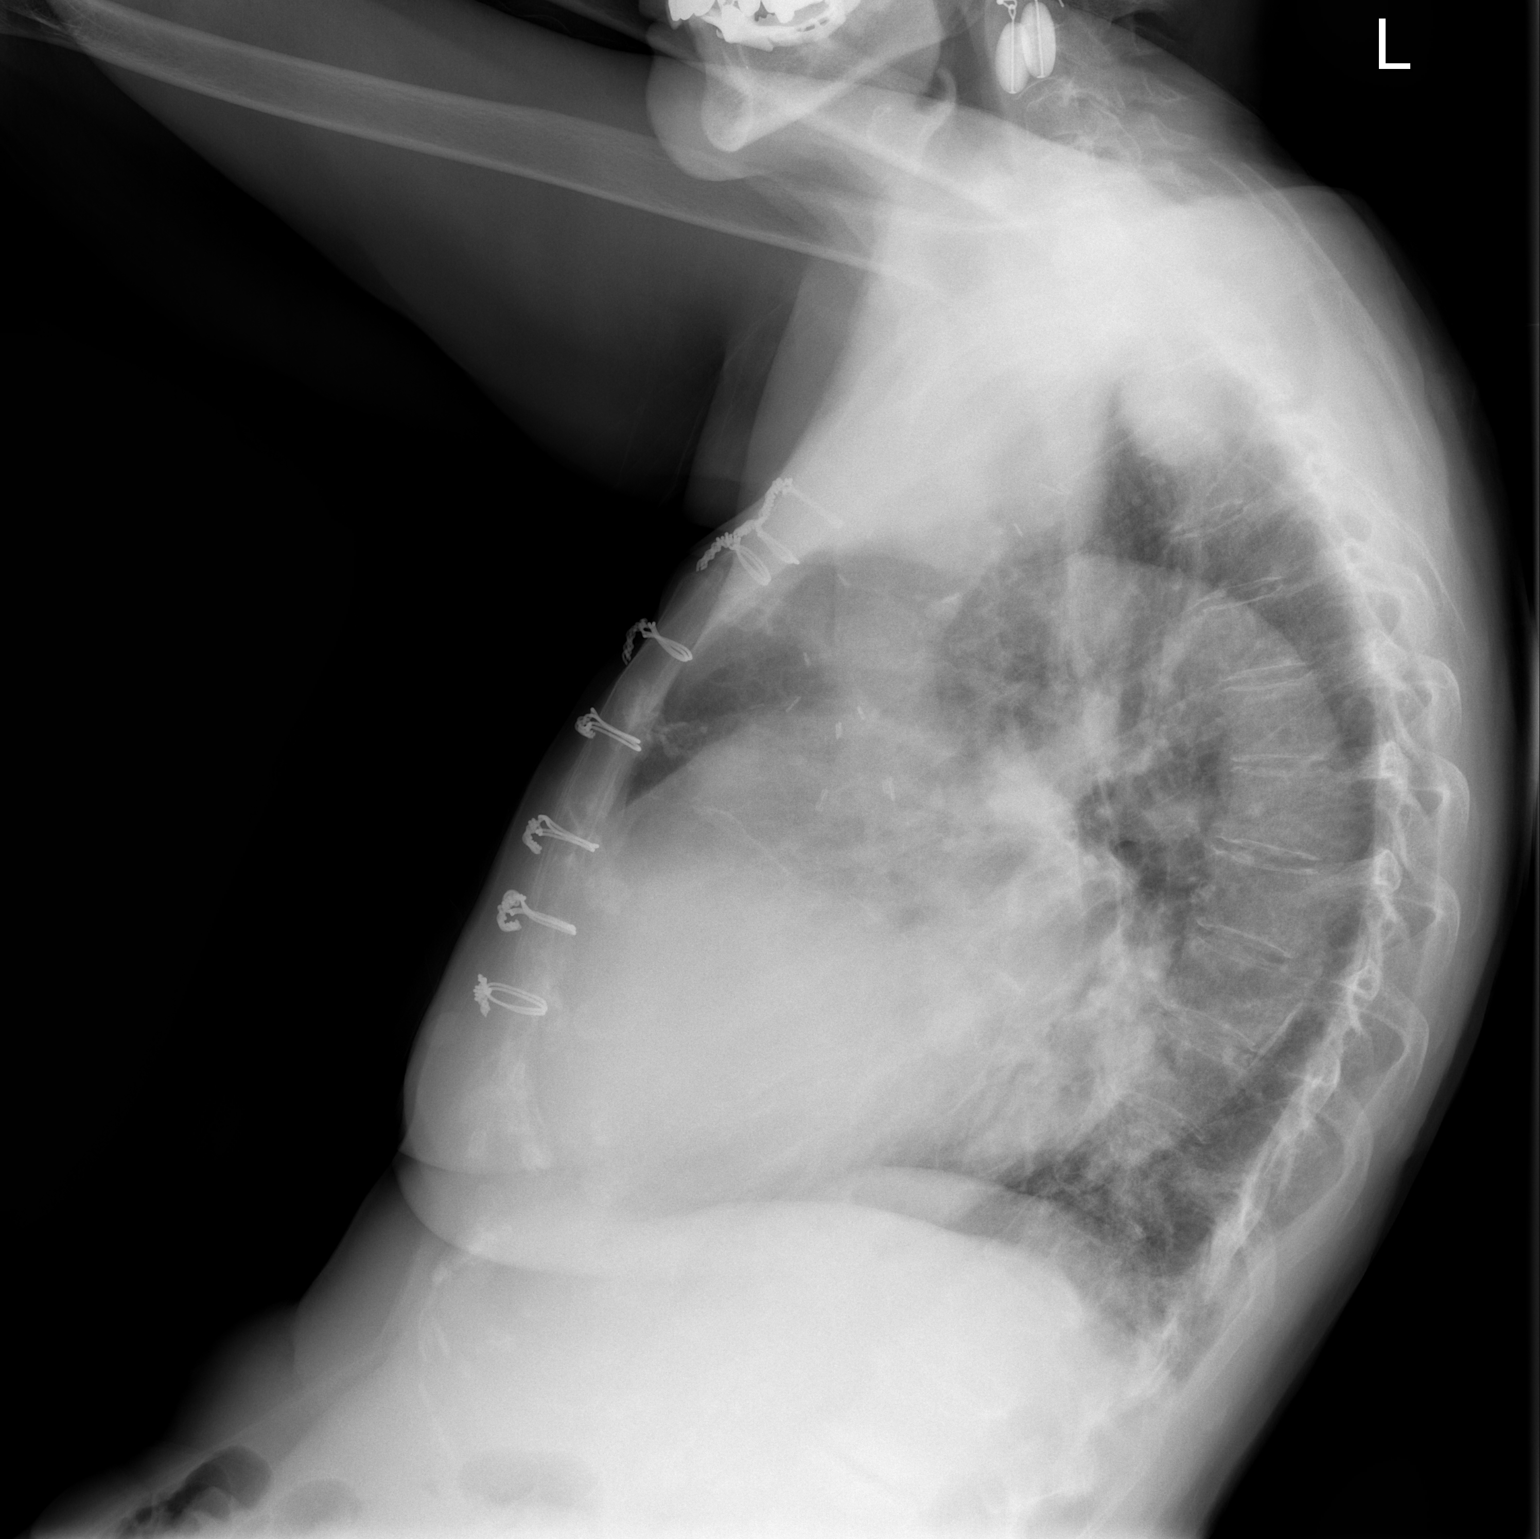

[2 of 2 positions shown; findings below may reference images not displayed]

FINDINGS: Status post CABG. Moderate cardiac enlargement. Vascular pattern
within normal limits. No evidence of infiltrate or edema. Minimal
scarring or atelectasis in the lingula, stable. No pleural effusion.
IMPRESSION: Stable findings with no acute abnormality

## 2017-12-24 IMAGING — MR MR ABDOMEN WO/W CM
11 of 21 series · 21 of 48 positions shown · IV contrast (multihance)
Comparison: 02/26/2016 CT.

CLINICAL DATA: CT demonstrating possible pancreatic head mass.
Abdominal and pelvic pain with nausea and diarrhea.

EXAM:
MRI ABDOMEN WITHOUT AND WITH CONTRAST
TECHNIQUE: Multiplanar multisequence MR imaging of the abdomen was performed
both before and after the administration of intravenous contrast.
CONTRAST:  14mL MULTIHANCE GADOBENATE DIMEGLUMINE 529 MG/ML IV SOLN

[Series 3: T2 fat-sat · axial · 5.0mm · 0.78mm/px · z∈[-97,+173]mm · 2 of 55 slices shown]
[im 1/55]
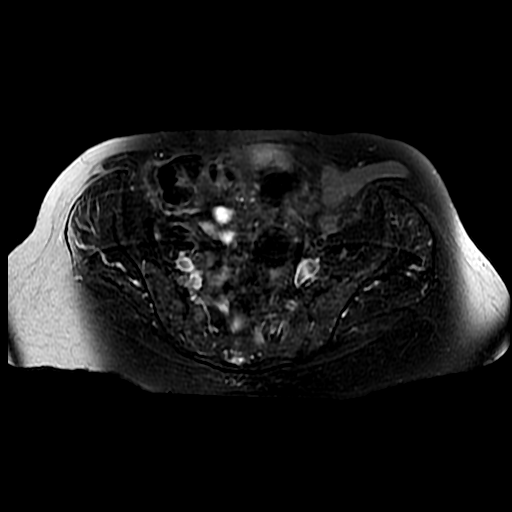
[im 55/55]
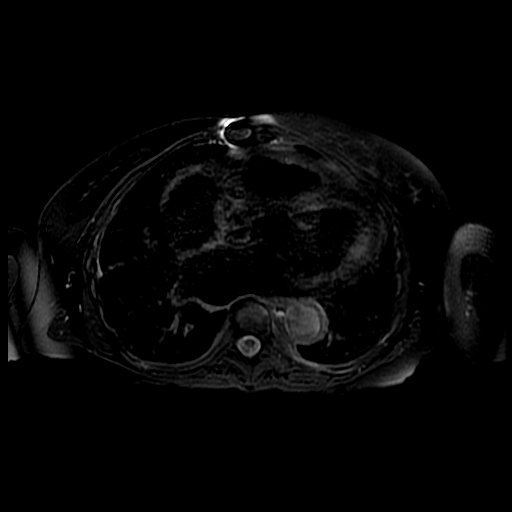

[Series 4: DWI b500 · axial · 6.0mm · 1.48mm/px · z∈[-85,+173]mm · 3 of 68 slices shown]
[im 1/68]
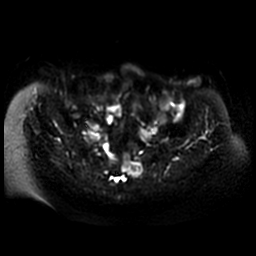
[im 34/68]
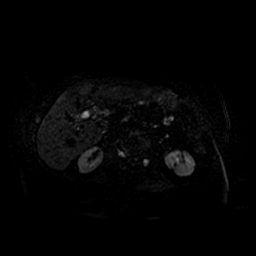
[im 68/68]
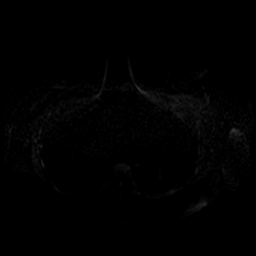

[Series 7: ax dualecho · axial · 5.0mm · 0.78mm/px · z∈[-97,+173]mm · 3 of 110 slices shown]
[im 1/110]
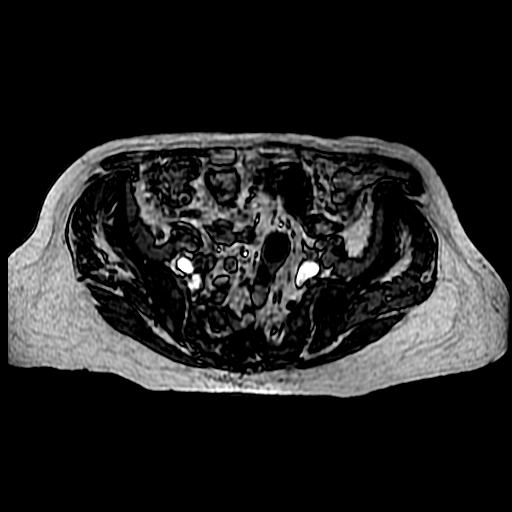
[im 55/110]
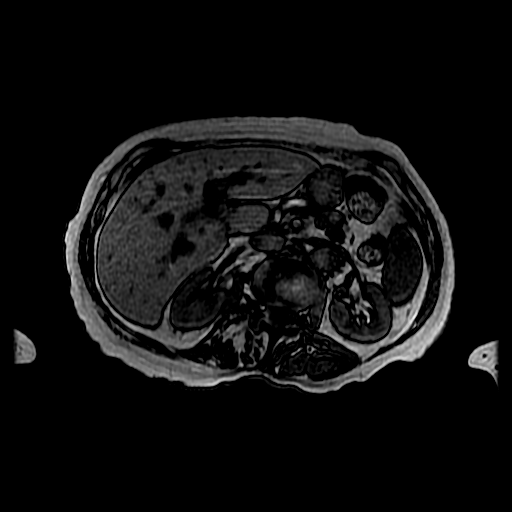
[im 110/110]
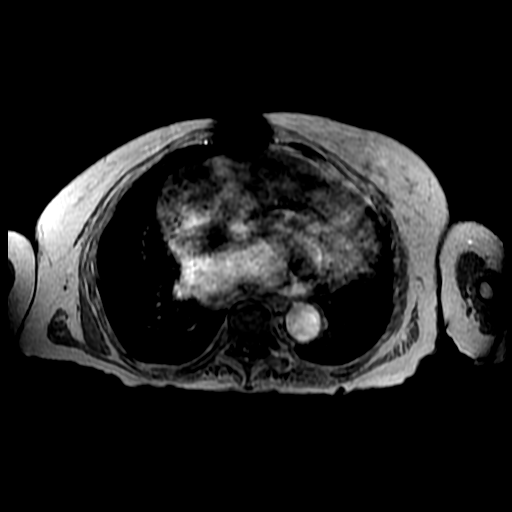

[Series 8: T2 · axial · 5.0mm · 0.78mm/px · 1 of 54 slices shown (1 of 2)]
[im 1/54]
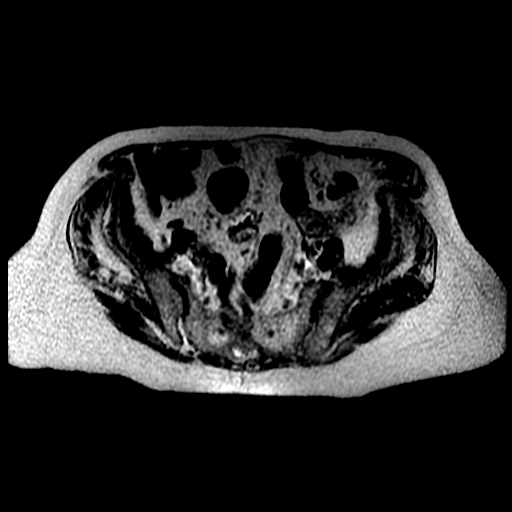

[Series 9: bSSFP · coronal · 5.0mm · 0.78mm/px · 1 of 44 slices shown]
[im 1/44]
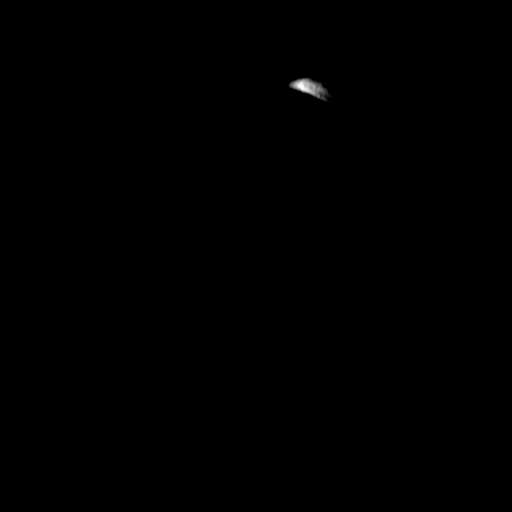

[Series 10: T2 · coronal · 5.0mm · 0.78mm/px · 1 of 44 slices shown (2 of 2)]
[im 1/44]
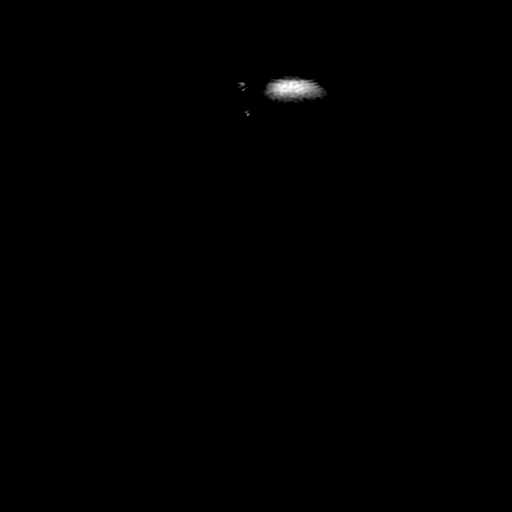

[Series 400: DWI · axial · 6.0mm · 1.48mm/px · 1 of 34 slices shown]
[im 1/34]
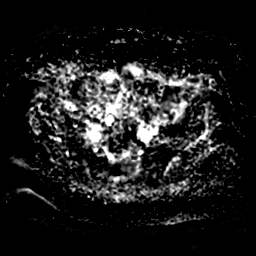

[Series 500: reformatted · axial · 1.6mm · 0.62mm/px · z∈[+32,+162]mm · 4 of 157 slices shown (1 of 2)]
[im 1/157]
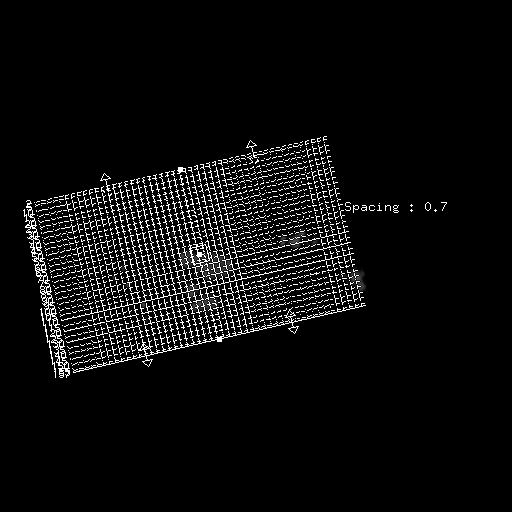
[im 53/157]
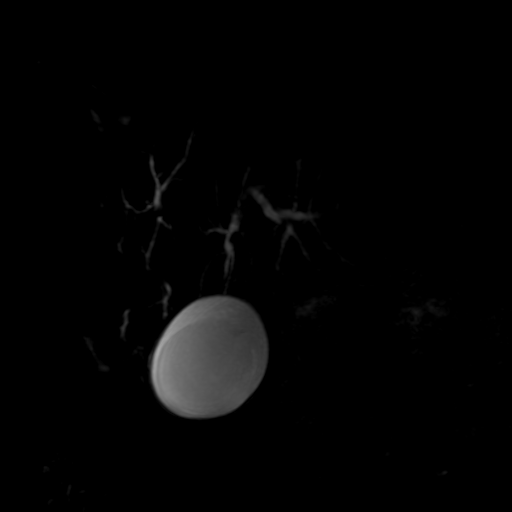
[im 105/157]
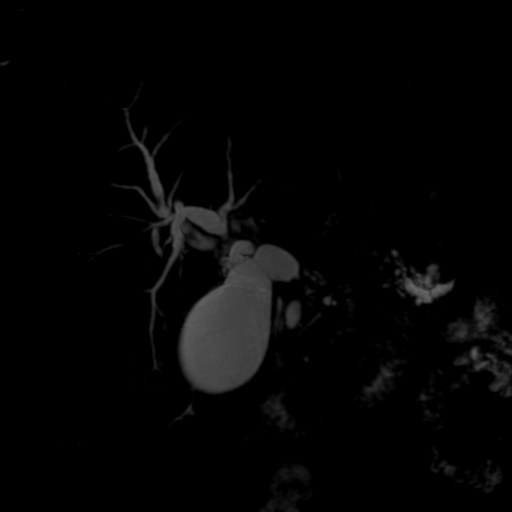
[im 157/157]
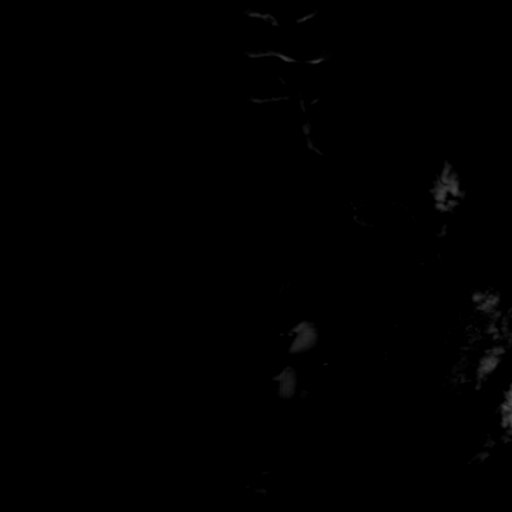

[Series 501: reformatted · coronal · 100.0mm · 0.51mm/px · 2 of 96 slices shown (2 of 2)]
[im 1/96]
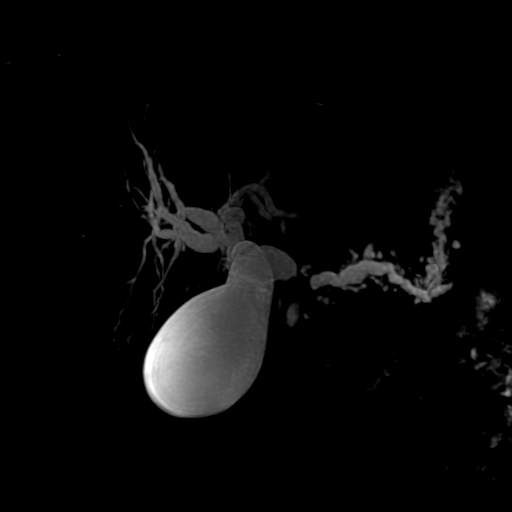
[im 96/96]
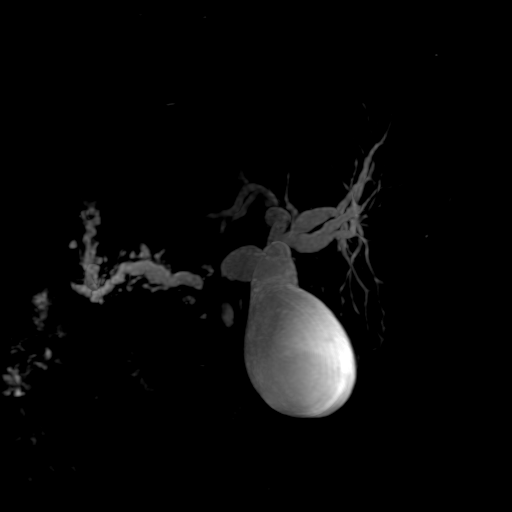

[Series 1100: T1 dynamic · axial · 5.6mm · 0.78mm/px · z∈[-88,+156]mm · 2 of 88 slices shown (1 of 2)]
[im 1/88]
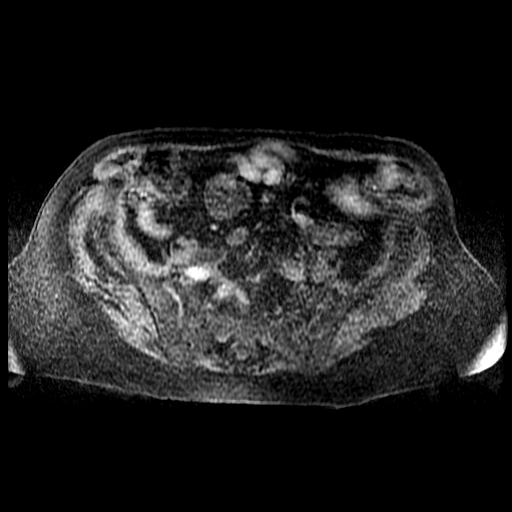
[im 88/88]
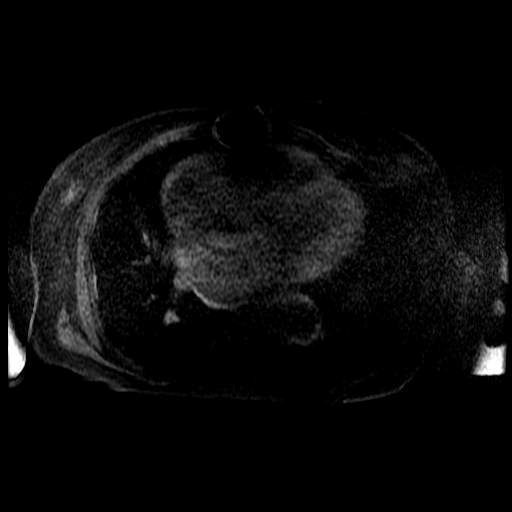

[Series 1101: T1 dynamic · axial · 5.6mm · 0.78mm/px · 1 of 88 slices shown (2 of 2)]
[im 1/88]
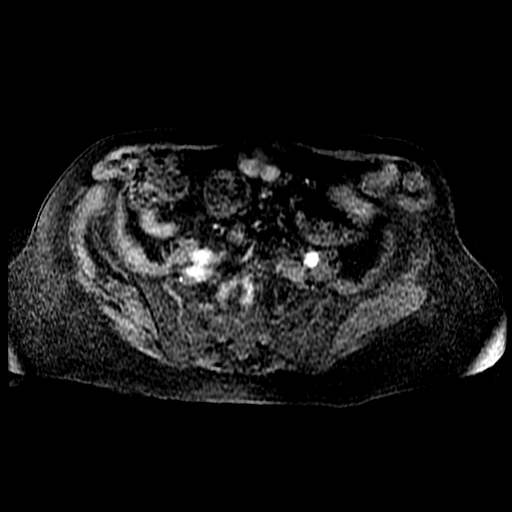

[21 of 48 positions shown; findings below may reference images not displayed]

FINDINGS: Portions of exam are mildly motion degraded.

Lower chest:  Cardiomegaly.  Median sternotomy.

Hepatobiliary: No focal liver lesion. Hepatomegaly, 19.4 cm
craniocaudal. Small gallstones. No acute cholecystitis. Borderline
gallbladder distension. Moderate intra and extrahepatic biliary duct
dilatation. Common duct 18 mm. No choledocholithiasis.

Pancreas: Pancreatic body and tail atrophy with main and side branch
duct dilatation. Example pancreatic duct at 9 mm on image 30/series
3 and 10 mm on image 55/series 5. Duct dilatation continues to level
of an ill-defined pancreatic head mass which measures 3.0 x 3.2 cm
on image 59/ series 00883. Also image 34/ series 3. Restricted
diffusion identified on image 56/series 4.

No acute pancreatitis.

Spleen: Normal in size, without focal abnormality.

Adrenals/Urinary Tract: Normal adrenal glands. Mild bilateral renal
scarring. Too small to characterize left renal lesion. No
hydronephrosis.

Stomach/Bowel: Underdistended stomach. Colonic stool burden suggests
constipation. Normal abdominal small bowel.

Vascular/Lymphatic: Aortic tortuosity. The celiac and superior
mesenteric arteryare both uninvolved with tumor. The portal vein is
uninvolved. Tumor is identified about approximately 180 degrees of
the superior mesenteric vein, including on image 59/series 2.

No retroperitoneal or retrocrural adenopathy.

Other: No ascites.  No evidence of omental or peritoneal disease.

Musculoskeletal: Multiple foci of thoracic and lumbar nerve root
ectasia. Multiple T2 hyperintense enhancing lesions, most consistent
with osseous metastasis. Example within anterior lower thoracic
spine of 1.7 cm on image 3/series 3, left hemi sacrum at 3.1 x
cm on image 52/series 3.
IMPRESSION: 1. Mildly motion degraded exam.
2. Pancreatic head mass, most consistent with adenocarcinoma. This
results in pancreatic and biliary duct dilatation. No arterial
involvement. Probable superior mesenteric vein tumor involvement.
Potential clinical strategies include tissue sampling via endoscopic
ultrasound. Alternatively, the left sacral mass could be amenable to
percutaneous sampling (to allow staging as well as a histological
diagnosis).
3. Multiple enhancing osseous lesions, highly suspicious for
metastatic disease.
4. Cholelithiasis with borderline gallbladder distension. No acute
cholecystitis.
5.  Possible constipation.

## 2023-02-14 DEATH — deceased
